# Patient Record
Sex: Female | Born: 1945
Health system: Southern US, Community
[De-identification: ages and names within clinical notes are randomized; demographics above are authoritative.]

## PROBLEM LIST (undated history)

## (undated) DIAGNOSIS — E2839 Other primary ovarian failure: Secondary | ICD-10-CM

## (undated) DIAGNOSIS — M25512 Pain in left shoulder: Secondary | ICD-10-CM

## (undated) DIAGNOSIS — N200 Calculus of kidney: Secondary | ICD-10-CM

## (undated) DIAGNOSIS — E785 Hyperlipidemia, unspecified: Secondary | ICD-10-CM

## (undated) DIAGNOSIS — M1990 Unspecified osteoarthritis, unspecified site: Secondary | ICD-10-CM

## (undated) DIAGNOSIS — E669 Obesity, unspecified: Secondary | ICD-10-CM

## (undated) DIAGNOSIS — I639 Cerebral infarction, unspecified: Secondary | ICD-10-CM

## (undated) DIAGNOSIS — E119 Type 2 diabetes mellitus without complications: Secondary | ICD-10-CM

## (undated) DIAGNOSIS — I10 Essential (primary) hypertension: Secondary | ICD-10-CM

## (undated) DIAGNOSIS — E538 Deficiency of other specified B group vitamins: Secondary | ICD-10-CM

## (undated) HISTORY — DX: Essential (primary) hypertension: I10

## (undated) HISTORY — DX: Calculus of kidney: N20.0

## (undated) HISTORY — DX: Type 2 diabetes mellitus without complications: E11.9

## (undated) HISTORY — PX: ABDOMINAL HYSTERECTOMY: SHX81

## (undated) HISTORY — DX: Unspecified osteoarthritis, unspecified site: M19.90

## (undated) HISTORY — DX: Hyperlipidemia, unspecified: E78.5

## (undated) HISTORY — DX: Obesity, unspecified: E66.9

## (undated) HISTORY — DX: Deficiency of other specified B group vitamins: E53.8

## (undated) HISTORY — DX: Other primary ovarian failure: E28.39

## (undated) HISTORY — PX: CHOLECYSTECTOMY: SHX55

## (undated) HISTORY — PX: OTHER SURGICAL HISTORY: SHX169

## (undated) HISTORY — DX: Pain in left shoulder: M25.512

---

## 2006-11-20 ENCOUNTER — Emergency Department: Payer: Self-pay | Admitting: Unknown Physician Specialty

## 2006-11-20 ENCOUNTER — Other Ambulatory Visit: Payer: Self-pay

## 2007-01-27 ENCOUNTER — Ambulatory Visit: Payer: Self-pay | Admitting: Specialist

## 2007-12-07 HISTORY — PX: LITHOTRIPSY: SUR834

## 2009-07-08 LAB — HM DEXA SCAN

## 2009-11-05 HISTORY — PX: MYRINGOTOMY: SUR874

## 2009-11-14 ENCOUNTER — Ambulatory Visit: Payer: Self-pay

## 2009-11-30 ENCOUNTER — Ambulatory Visit: Payer: Self-pay | Admitting: Urology

## 2009-12-01 ENCOUNTER — Emergency Department: Payer: Self-pay | Admitting: Emergency Medicine

## 2009-12-14 ENCOUNTER — Ambulatory Visit: Payer: Self-pay | Admitting: General Practice

## 2009-12-27 ENCOUNTER — Ambulatory Visit: Payer: Self-pay | Admitting: General Practice

## 2010-03-26 ENCOUNTER — Ambulatory Visit: Payer: Self-pay | Admitting: General Practice

## 2010-05-11 ENCOUNTER — Ambulatory Visit: Payer: Self-pay | Admitting: Otolaryngology

## 2010-06-25 ENCOUNTER — Ambulatory Visit: Payer: Self-pay | Admitting: Urology

## 2010-07-03 ENCOUNTER — Ambulatory Visit: Payer: Self-pay | Admitting: Urology

## 2010-07-05 ENCOUNTER — Ambulatory Visit: Payer: Self-pay | Admitting: Urology

## 2010-07-19 ENCOUNTER — Ambulatory Visit: Payer: Self-pay | Admitting: Urology

## 2010-07-31 LAB — HM PAP SMEAR: HM PAP: NORMAL

## 2010-12-04 ENCOUNTER — Ambulatory Visit: Payer: Self-pay | Admitting: Urology

## 2011-09-09 ENCOUNTER — Ambulatory Visit: Payer: Self-pay | Admitting: Family Medicine

## 2012-07-20 ENCOUNTER — Ambulatory Visit: Payer: Self-pay | Admitting: Gastroenterology

## 2012-07-20 LAB — HM COLONOSCOPY: HM Colonoscopy: NEGATIVE

## 2013-05-31 ENCOUNTER — Ambulatory Visit: Payer: Self-pay | Admitting: Urology

## 2013-07-08 HISTORY — PX: COLONOSCOPY: SHX174

## 2013-07-19 ENCOUNTER — Ambulatory Visit: Payer: Self-pay | Admitting: Urology

## 2013-07-26 ENCOUNTER — Ambulatory Visit: Payer: Self-pay | Admitting: Urology

## 2013-07-28 LAB — URINE CULTURE

## 2013-08-04 ENCOUNTER — Emergency Department: Payer: Self-pay | Admitting: Emergency Medicine

## 2013-08-04 ENCOUNTER — Ambulatory Visit: Payer: Self-pay | Admitting: Urology

## 2013-08-04 LAB — URINALYSIS, COMPLETE
BILIRUBIN, UR: NEGATIVE
KETONE: NEGATIVE
Nitrite: POSITIVE
PH: 6 (ref 4.5–8.0)
Protein: 100
RBC,UR: 3037 /HPF (ref 0–5)
Specific Gravity: 1.012 (ref 1.003–1.030)
Squamous Epithelial: NONE SEEN
WBC UR: 76 /HPF (ref 0–5)

## 2013-09-09 ENCOUNTER — Ambulatory Visit: Payer: Self-pay | Admitting: Urology

## 2013-09-16 ENCOUNTER — Ambulatory Visit: Payer: Self-pay | Admitting: Urology

## 2013-09-23 ENCOUNTER — Ambulatory Visit: Payer: Self-pay | Admitting: Urology

## 2013-10-06 ENCOUNTER — Ambulatory Visit: Payer: Self-pay | Admitting: Urology

## 2014-04-05 ENCOUNTER — Ambulatory Visit: Payer: Self-pay | Admitting: Family Medicine

## 2014-04-05 LAB — HM MAMMOGRAPHY: HM Mammogram: NORMAL

## 2014-06-28 LAB — LIPID PANEL
CHOLESTEROL: 173 mg/dL (ref 0–200)
HDL: 72 mg/dL — AB (ref 35–70)
LDL CALC: 83 mg/dL
Triglycerides: 90 mg/dL (ref 40–160)

## 2014-08-08 DIAGNOSIS — N182 Chronic kidney disease, stage 2 (mild): Secondary | ICD-10-CM | POA: Diagnosis not present

## 2014-08-08 DIAGNOSIS — E1165 Type 2 diabetes mellitus with hyperglycemia: Secondary | ICD-10-CM | POA: Diagnosis not present

## 2014-08-08 DIAGNOSIS — E1122 Type 2 diabetes mellitus with diabetic chronic kidney disease: Secondary | ICD-10-CM | POA: Diagnosis not present

## 2014-10-29 NOTE — Op Note (Signed)
PATIENT NAME:  Christine Roberts, Christine Roberts MR#:  [id number] DATE OF BIRTH:  1946-06-20  DATE OF PROCEDURE:  08/04/2013  PREOPERATIVE DIAGNOSIS: Left renal calculi.   POSTOPERATIVE DIAGNOSIS: Left renal calculi.    PROCEDURES:  1.  Left ureteroscopy. 2.  Placement of left ureteral stent.   SURGEON: John Giovanni, MD   ASSISTANT: None.   ANESTHESIA: General.   INDICATIONS: The patient is a 69 year old female with 3 left renal calculi measuring 6 to 10 mm in diameter. She has been intermittently symptomatic. After discussion discussing treatment options, she has elected ureteroscopy.   DESCRIPTION OF PROCEDURE: The patient was taken to the endoscopy suite and placed on the table in the supine position. A general anesthetic was administered via an endotracheal tube. She was then placed in the low lithotomy position, and her external genitalia were prepped and draped in the usual fashion. A timeout was performed per hospital protocol with all in agreement. A 21-French cystoscope sheath with obturator was lubricated and passed per urethra. Panendoscopy was performed, and the ureteral orifices were normal-appearing with clear efflux bilaterally. Bladder mucosa was normal in appearance without erythema, solid or papillary lesions. A 0.035 Glidewire was placed through the cystoscope and into the left ureteral orifice and easily passed up into the renal pelvis under fluoroscopic guidance. A second 0.035 PTFE guidewire was placed in a similar fashion without difficulty. The cystoscope was removed, and an AUR-8 flexible ureteroscope was placed over the Glidewire. The left distal ureter could not be engaged, and the ureteroscope was removed. A Navigator access sheath was able to be gently placed over the wire into the distal ureter. The obturator was removed. The ureteroscope was repassed up the proximal ureter; however, there was slight narrowing of the proximal ureter, and the ureteroscope could not be advanced. The  guidewire was replaced, and the ureteroscope could not be advanced over the wire. A cystoscope sheath was placed in the bladder, and the ureteroscope was placed through that to provide slightly more resistance, and again the ureteroscope would not advance into the renal pelvis. A dilating balloon was not available. It was elected at this point to place a ureteral stent to allow dilation of the system and return at a later date. A 7-French/24 cm Contour ureteral stent was placed over the Glidewire. The second guidewire was removed. The proximal end of the stent was well positioned in the renal pelvis. The distal end of the stent was well positioned in the bladder under direct vision. The bladder was emptied, and all instruments were removed. A B and O suppository was placed per rectum. She was taken to the PACU in stable condition. There were no complications. EBL was minimal.   ____________________________ Ronda Fairly. Bernardo Heater, MD scs:jcm D: 08/04/2013 14:18:54 ET T: 08/04/2013 16:14:14 ET JOB#: [id number]  cc: Nicki Reaper C. Bernardo Heater, MD, <Dictator> Abbie Sons MD ELECTRONICALLY SIGNED 08/11/2013 11:20

## 2014-11-16 DIAGNOSIS — E1122 Type 2 diabetes mellitus with diabetic chronic kidney disease: Secondary | ICD-10-CM | POA: Diagnosis not present

## 2014-11-16 DIAGNOSIS — E1165 Type 2 diabetes mellitus with hyperglycemia: Secondary | ICD-10-CM | POA: Diagnosis not present

## 2014-11-16 DIAGNOSIS — N200 Calculus of kidney: Secondary | ICD-10-CM | POA: Diagnosis not present

## 2014-11-16 DIAGNOSIS — N182 Chronic kidney disease, stage 2 (mild): Secondary | ICD-10-CM | POA: Diagnosis not present

## 2014-11-16 DIAGNOSIS — E785 Hyperlipidemia, unspecified: Secondary | ICD-10-CM | POA: Diagnosis not present

## 2014-11-16 DIAGNOSIS — Z713 Dietary counseling and surveillance: Secondary | ICD-10-CM | POA: Diagnosis not present

## 2014-11-16 DIAGNOSIS — E669 Obesity, unspecified: Secondary | ICD-10-CM | POA: Diagnosis not present

## 2014-11-16 DIAGNOSIS — I10 Essential (primary) hypertension: Secondary | ICD-10-CM | POA: Diagnosis not present

## 2014-11-16 LAB — HEMOGLOBIN A1C: HEMOGLOBIN A1C: 8.4 % — AB (ref 4.0–6.0)

## 2015-01-13 DIAGNOSIS — R350 Frequency of micturition: Secondary | ICD-10-CM | POA: Diagnosis not present

## 2015-01-13 DIAGNOSIS — R140 Abdominal distension (gaseous): Secondary | ICD-10-CM | POA: Diagnosis not present

## 2015-01-13 DIAGNOSIS — R918 Other nonspecific abnormal finding of lung field: Secondary | ICD-10-CM | POA: Diagnosis not present

## 2015-01-13 DIAGNOSIS — N200 Calculus of kidney: Secondary | ICD-10-CM | POA: Diagnosis not present

## 2015-01-13 DIAGNOSIS — R312 Other microscopic hematuria: Secondary | ICD-10-CM | POA: Diagnosis not present

## 2015-01-13 DIAGNOSIS — M47819 Spondylosis without myelopathy or radiculopathy, site unspecified: Secondary | ICD-10-CM | POA: Diagnosis not present

## 2015-01-25 DIAGNOSIS — H2513 Age-related nuclear cataract, bilateral: Secondary | ICD-10-CM | POA: Diagnosis not present

## 2015-01-25 LAB — HM DIABETES EYE EXAM

## 2015-02-27 ENCOUNTER — Other Ambulatory Visit: Payer: Self-pay | Admitting: Family Medicine

## 2015-02-27 MED ORDER — PIOGLITAZONE HCL 15 MG PO TABS: 15.0000 mg | ORAL_TABLET | Freq: Every day | ORAL | Status: DC

## 2015-02-27 NOTE — Telephone Encounter (Signed)
Requesting refill on Actos, please send to [facility] if you are sending a 90day supply but if you choose to give her a 30day supply (just to see if you want her to stay on it) then send to [facility]

## 2015-02-27 NOTE — Telephone Encounter (Signed)
Patient requesting refill. 

## 2015-03-18 ENCOUNTER — Encounter: Payer: Self-pay | Admitting: Family Medicine

## 2015-03-18 DIAGNOSIS — E785 Hyperlipidemia, unspecified: Secondary | ICD-10-CM | POA: Insufficient documentation

## 2015-03-18 DIAGNOSIS — Z9889 Other specified postprocedural states: Secondary | ICD-10-CM | POA: Insufficient documentation

## 2015-03-18 DIAGNOSIS — E538 Deficiency of other specified B group vitamins: Secondary | ICD-10-CM | POA: Insufficient documentation

## 2015-03-18 DIAGNOSIS — R809 Proteinuria, unspecified: Secondary | ICD-10-CM | POA: Insufficient documentation

## 2015-03-18 DIAGNOSIS — M25519 Pain in unspecified shoulder: Secondary | ICD-10-CM | POA: Insufficient documentation

## 2015-03-18 DIAGNOSIS — K449 Diaphragmatic hernia without obstruction or gangrene: Secondary | ICD-10-CM | POA: Insufficient documentation

## 2015-03-18 DIAGNOSIS — E1121 Type 2 diabetes mellitus with diabetic nephropathy: Secondary | ICD-10-CM | POA: Insufficient documentation

## 2015-03-18 DIAGNOSIS — IMO0002 Reserved for concepts with insufficient information to code with codable children: Secondary | ICD-10-CM | POA: Insufficient documentation

## 2015-03-18 DIAGNOSIS — Z86010 Personal history of colonic polyps: Secondary | ICD-10-CM | POA: Insufficient documentation

## 2015-03-18 DIAGNOSIS — I10 Essential (primary) hypertension: Secondary | ICD-10-CM | POA: Insufficient documentation

## 2015-03-18 DIAGNOSIS — M1990 Unspecified osteoarthritis, unspecified site: Secondary | ICD-10-CM | POA: Insufficient documentation

## 2015-03-18 DIAGNOSIS — N200 Calculus of kidney: Secondary | ICD-10-CM | POA: Insufficient documentation

## 2015-03-18 DIAGNOSIS — H9190 Unspecified hearing loss, unspecified ear: Secondary | ICD-10-CM | POA: Insufficient documentation

## 2015-03-21 ENCOUNTER — Ambulatory Visit (INDEPENDENT_AMBULATORY_CARE_PROVIDER_SITE_OTHER): Payer: Commercial Managed Care - HMO | Admitting: Family Medicine

## 2015-03-21 ENCOUNTER — Encounter: Payer: Self-pay | Admitting: Family Medicine

## 2015-03-21 VITALS — BP 134/76 | HR 90 | Temp 97.6°F | Resp 18 | Ht 62.0 in | Wt 200.2 lb

## 2015-03-21 DIAGNOSIS — R5383 Other fatigue: Secondary | ICD-10-CM

## 2015-03-21 DIAGNOSIS — R1013 Epigastric pain: Secondary | ICD-10-CM | POA: Diagnosis not present

## 2015-03-21 DIAGNOSIS — M150 Primary generalized (osteo)arthritis: Secondary | ICD-10-CM

## 2015-03-21 DIAGNOSIS — R809 Proteinuria, unspecified: Secondary | ICD-10-CM

## 2015-03-21 DIAGNOSIS — E785 Hyperlipidemia, unspecified: Secondary | ICD-10-CM | POA: Diagnosis not present

## 2015-03-21 DIAGNOSIS — M8949 Other hypertrophic osteoarthropathy, multiple sites: Secondary | ICD-10-CM

## 2015-03-21 DIAGNOSIS — R072 Precordial pain: Secondary | ICD-10-CM

## 2015-03-21 DIAGNOSIS — K449 Diaphragmatic hernia without obstruction or gangrene: Secondary | ICD-10-CM

## 2015-03-21 DIAGNOSIS — M159 Polyosteoarthritis, unspecified: Secondary | ICD-10-CM

## 2015-03-21 DIAGNOSIS — M791 Myalgia, unspecified site: Secondary | ICD-10-CM

## 2015-03-21 DIAGNOSIS — Z23 Encounter for immunization: Secondary | ICD-10-CM

## 2015-03-21 DIAGNOSIS — I10 Essential (primary) hypertension: Secondary | ICD-10-CM | POA: Diagnosis not present

## 2015-03-21 DIAGNOSIS — K30 Functional dyspepsia: Secondary | ICD-10-CM

## 2015-03-21 DIAGNOSIS — E1121 Type 2 diabetes mellitus with diabetic nephropathy: Secondary | ICD-10-CM | POA: Diagnosis not present

## 2015-03-21 LAB — POCT UA - MICROALBUMIN: MICROALBUMIN (UR) POC: 20 mg/L

## 2015-03-21 LAB — POCT GLYCOSYLATED HEMOGLOBIN (HGB A1C): HEMOGLOBIN A1C: 7.6

## 2015-03-21 MED ORDER — LISINOPRIL 40 MG PO TABS: 40.0000 mg | ORAL_TABLET | Freq: Every day | ORAL | Status: DC

## 2015-03-21 MED ORDER — OMEPRAZOLE 40 MG PO CPDR: 40.0000 mg | DELAYED_RELEASE_CAPSULE | Freq: Every day | ORAL | Status: DC

## 2015-03-21 MED ORDER — METFORMIN HCL 1000 MG PO TABS: 1000.0000 mg | ORAL_TABLET | Freq: Two times a day (BID) | ORAL | Status: DC

## 2015-03-21 MED ORDER — PIOGLITAZONE HCL 15 MG PO TABS: 15.0000 mg | ORAL_TABLET | Freq: Every day | ORAL | Status: DC

## 2015-03-21 MED ORDER — ASPIRIN EC 81 MG PO TBEC
81.0000 mg | DELAYED_RELEASE_TABLET | Freq: Every day | ORAL | Status: AC
Start: 2015-03-21 — End: ?

## 2015-03-21 NOTE — Progress Notes (Signed)
Name: Christine Roberts   MRN: [id number]    DOB: Feb 23, 1946   Date:03/21/2015       Progress Note  Subjective  Chief Complaint  Chief Complaint  Patient presents with  . Medication Refill    followup  . Diabetes    low-50, avg-142 check BG periodically and has been having dizziness/lightheaded in am with low sugars  . Hypertension    dizziness  . Hyperlipidemia    bone sorness/stiffness    HPI  DMII : her fsbs has been going down, to the 50's, but states the average is 142, but was going down to 50's a lot, however ran out of glipizide a couple of weeks ago and hypoglycemia resolved. HgbA1C has improved since last checked down from 8.4% and 7.6%. Down to 10 units of Levemir daily and taking oral medications as scheduled.   HTN: taking medication and denies side effects, gets dizzy with hypoglycemia only  Hyperlipidemia: she takes Atorvastatin twice weekly and is having muscle pain, usually right leg, feels tired and sore.   Indigestion: she has a history of hiatal hernia, and also states anxiety in the past, but over the past few months she has noticed chest tightness that happens around 2 am a couple times a month, she gets up and takes an aspirin and symptoms resolves. She denies heartburn or regurgitation. She states once she had eaten something spicy before going to bed. No SOB or decrease in exercise tolerance  OA: she has some DIP deformities on both hands , also feels stiff and more tired than usual . Denies headaches. She has a puppy - past 2 months and has been walking the dog daily , but thinks pain started to get worse before she got the dog. Also has noticed fatigue and myalgia.    Patient Active Problem List   Diagnosis Date Noted  . Benign essential HTN 03/18/2015  . Pain in shoulder 03/18/2015  . Dyslipidemia 03/18/2015  . History of surgery to major organs, presenting hazards to health 03/18/2015  . Hearing loss 03/18/2015  . Hiatal hernia 03/18/2015  . History of  colon polyps 03/18/2015  . Calculus of kidney 03/18/2015  . B12 deficiency 03/18/2015  . Microalbuminuria 03/18/2015  . Benign melanoma 03/18/2015  . Adult BMI 30+ 03/18/2015  . Arthritis, degenerative 03/18/2015  . Diabetes mellitus with nephropathy 03/18/2015    Past Surgical History  Procedure Laterality Date  . Lithotripsy  12/2007  . Myringotomy Right 11/2009  . Cystoscopic      Family History  Problem Relation Age of Onset  . Cancer Mother     Social History   Social History  . Marital Status: Single    Spouse Name: N/A  . Number of Children: N/A  . Years of Education: N/A   Occupational History  . Not on file.   Social History Main Topics  . Smoking status: Never Smoker   . Smokeless tobacco: Never Used  . Alcohol Use: Not on file  . Drug Use: No  . Sexual Activity: Not Currently   Other Topics Concern  . Not on file   Social History Narrative     Current outpatient prescriptions:  .  aspirin EC 81 MG tablet, Take 1 tablet (81 mg total) by mouth daily., Disp: 90 tablet, Rfl: 0 .  atorvastatin (LIPITOR) 40 MG tablet, Take by mouth., Disp: , Rfl:  .  calcium citrate (CALCITRATE - DOSED IN MG ELEMENTAL CALCIUM) 950 MG tablet, Take by mouth., Disp: ,  Rfl:  .  Cholecalciferol (VITAMIN D) 2000 UNITS CAPS, Take by mouth., Disp: , Rfl:  .  Cyanocobalamin (B-12) 1000 MCG SUBL, Place under the tongue., Disp: , Rfl:  .  glucose blood (FREESTYLE LITE) test strip, FREESTYLE LITE TEST (In Vitro Strip)  fsbs bid for 0 days  Quantity: 100.00;  Refills: 5   Ordered :06-Aug-2010  Steele Sizer MD;  Started 25-Oct-2009 Active Comments: DX: 250.02, Disp: , Rfl:  .  insulin detemir (LEVEMIR) 100 UNIT/ML injection, Inject into the skin., Disp: , Rfl:  .  lisinopril (PRINIVIL,ZESTRIL) 40 MG tablet, Take 1 tablet (40 mg total) by mouth daily., Disp: 90 tablet, Rfl: 3 .  magnesium oxide (MAG-OX) 400 MG tablet, Take by mouth., Disp: , Rfl:  .  metFORMIN (GLUCOPHAGE) 1000 MG  tablet, Take 1 tablet (1,000 mg total) by mouth 2 (two) times daily., Disp: 180 tablet, Rfl: 1 .  omeprazole (PRILOSEC) 40 MG capsule, Take 1 capsule (40 mg total) by mouth daily., Disp: 30 capsule, Rfl: 2 .  pioglitazone (ACTOS) 15 MG tablet, Take 1 tablet (15 mg total) by mouth daily., Disp: 90 tablet, Rfl: 3  Allergies  Allergen Reactions  . Latex Rash  . Erythromycin      ROS  Ten systems reviewed and is negative except as mentioned in HPI   Objective  Filed Vitals:   03/21/15 1640  BP: 134/76  Pulse: 90  Temp: 97.6 F (36.4 C)  TempSrc: Oral  Resp: 18  Height: 5\' 2"  (1.575 m)  Weight: 200 lb 3.2 oz (90.81 kg)  SpO2: 97%    Body mass index is 36.61 kg/(m^2).  Physical Exam  Constitutional: Patient appears well-developed and well-nourished. Obese  No distress.  HEENT: head atraumatic, normocephalic, pupils equal and reactive to light,  neck supple, throat within normal limits Cardiovascular: Normal rate, regular rhythm and normal heart sounds.  No murmur heard. No BLE edema. Pulmonary/Chest: Effort normal and breath sounds normal. No respiratory distress. Abdominal: Soft.  There is no tenderness. Psychiatric: Patient has a normal mood and affect. behavior is normal. Judgment and thought content normal. Muscular Skeletal: DIP deformity.   Recent Results (from the past 2160 hour(s))  HM DIABETES EYE EXAM     Status: None   Collection Time: 01/25/15 12:00 AM  Result Value Ref Range   HM Diabetic Eye Exam No Retinopathy No Retinopathy  POCT UA - Microalbumin     Status: None   Collection Time: 03/21/15  4:44 PM  Result Value Ref Range   Microalbumin Ur, POC 20 mg/L   Creatinine, POC  mg/dL   Albumin/Creatinine Ratio, Urine, POC    POCT HgB A1C     Status: None   Collection Time: 03/21/15  4:44 PM  Result Value Ref Range   Hemoglobin A1C 7.6     Diabetic Foot Exam - Simple   Simple Foot Form  Visual Inspection  No deformities, no ulcerations, no other skin  breakdown bilaterally:  Yes  Sensation Testing  Pulse Check  Comments       PHQ2/9: Depression screen PHQ 2/9 03/21/2015  Decreased Interest 0  Down, Depressed, Hopeless 0  PHQ - 2 Score 0     Fall Risk: Fall Risk  03/21/2015  Falls in the past year? No      Functional Status Survey: Is the patient deaf or have difficulty hearing?: No Does the patient have difficulty seeing, even when wearing glasses/contacts?: Yes (glassses) Does the patient have difficulty concentrating, remembering, or making  decisions?: No Does the patient have difficulty walking or climbing stairs?: No Does the patient have difficulty dressing or bathing?: No Does the patient have difficulty doing errands alone such as visiting a doctor's office or shopping?: No    Assessment & Plan  1. Diabetic nephropathy associated with type 2 diabetes mellitus Stay off glipizide, continue 10 units of levemir per day, continue increase in physical activity to control glucose and drink plenty of water, follow a diabetic diet. Explained that low sugar is worse than slightly high sugar for her age group - POCT UA - Microalbumin - POCT HgB A1C - pioglitazone (ACTOS) 15 MG tablet; Take 1 tablet (15 mg total) by mouth daily.  Dispense: 90 tablet; Refill: 3 - metFORMIN (GLUCOPHAGE) 1000 MG tablet; Take 1 tablet (1,000 mg total) by mouth 2 (two) times daily.  Dispense: 180 tablet; Refill: 1  2. Needs flu shot  - Flu vaccine HIGH DOSE PF (Fluzone High dose)  3. Need for pneumococcal vaccination  - Pneumococcal conjugate vaccine 13-valent  4. Primary osteoarthritis involving multiple joints  take Tylenol prn   5. Hiatal hernia Diagnosed in the past, usually no problems.   6. Dyslipidemia Taking Atorvastatin twice weekly - Lipid panel  7. Benign essential HTN At goal  - lisinopril (PRINIVIL,ZESTRIL) 40 MG tablet; Take 1 tablet (40 mg total) by mouth daily.  Dispense: 90 tablet; Refill: 3 - Comprehensive  metabolic panel - CBC with Differential/Platelet  8. Microalbuminuria Continue ace  9. Other fatigue Sleeping more than usual , body aches, we will check sed rate and c-reactive protein, make sure not fibromyalgia rheumatica - Vitamin B12 - Vit D  25 hydroxy (rtn osteoporosis monitoring) - TSH  10. Indigestion Discussed differential diagnosis: angina, GERD or panic attack ( she has a history of anxiety in the past ) but she wants to hold off on cardiologist referral or alprazolam to take prn. Advised to call 911 if symptoms gets worse, lasts more than 15 minutes, associated diaphoresis, SOB or dizziness - omeprazole (PRILOSEC) 40 MG capsule; Take 1 capsule (40 mg total) by mouth daily.  Dispense: 30 capsule; Refill: 2  11. Precordial pain  - EKG 12-Lead  12. Myalgia With joint aches, fatigue, going on for the past few months, discussed polymyalgia rheumatica, we will check labs - C-reactive protein - Sedimentation rate

## 2015-03-29 ENCOUNTER — Telehealth: Payer: Self-pay | Admitting: Family Medicine

## 2015-03-29 ENCOUNTER — Other Ambulatory Visit: Payer: Self-pay

## 2015-03-29 MED ORDER — INSULIN DETEMIR 100 UNIT/ML ~~LOC~~ SOLN: 10.0000 [IU] | Freq: Every day | SUBCUTANEOUS | Status: DC

## 2015-03-29 NOTE — Telephone Encounter (Signed)
Requesting refill on Levimer. She only has one dose left. Requesting that you send a few doses to [facility] and then a 90day supply to [facility].

## 2015-03-29 NOTE — Telephone Encounter (Signed)
Wants a month supple sent to local [facility] and then 90 day sent to [facility]

## 2015-04-04 DIAGNOSIS — R5383 Other fatigue: Secondary | ICD-10-CM | POA: Diagnosis not present

## 2015-04-04 DIAGNOSIS — I10 Essential (primary) hypertension: Secondary | ICD-10-CM | POA: Diagnosis not present

## 2015-04-04 DIAGNOSIS — E785 Hyperlipidemia, unspecified: Secondary | ICD-10-CM | POA: Diagnosis not present

## 2015-04-05 LAB — VITAMIN B12: Vitamin B-12: 718 pg/mL (ref 211–946)

## 2015-04-05 LAB — CBC WITH DIFFERENTIAL/PLATELET
BASOS: 0 %
Basophils Absolute: 0 10*3/uL (ref 0.0–0.2)
EOS (ABSOLUTE): 0.4 10*3/uL (ref 0.0–0.4)
Eos: 5 %
Hematocrit: 36 % (ref 34.0–46.6)
Hemoglobin: 11.6 g/dL (ref 11.1–15.9)
Immature Grans (Abs): 0 10*3/uL (ref 0.0–0.1)
Immature Granulocytes: 0 %
Lymphocytes Absolute: 2.1 10*3/uL (ref 0.7–3.1)
Lymphs: 28 %
MCH: 27.3 pg (ref 26.6–33.0)
MCHC: 32.2 g/dL (ref 31.5–35.7)
MCV: 85 fL (ref 79–97)
MONOS ABS: 0.5 10*3/uL (ref 0.1–0.9)
Monocytes: 6 %
NEUTROS ABS: 4.6 10*3/uL (ref 1.4–7.0)
Neutrophils: 61 %
PLATELETS: 334 10*3/uL (ref 150–379)
RBC: 4.25 x10E6/uL (ref 3.77–5.28)
RDW: 14.1 % (ref 12.3–15.4)
WBC: 7.6 10*3/uL (ref 3.4–10.8)

## 2015-04-05 LAB — COMPREHENSIVE METABOLIC PANEL
A/G RATIO: 1.6 (ref 1.1–2.5)
ALT: 13 IU/L (ref 0–32)
AST: 8 IU/L (ref 0–40)
Albumin: 4.1 g/dL (ref 3.5–4.8)
Alkaline Phosphatase: 82 IU/L (ref 39–117)
BILIRUBIN TOTAL: 0.4 mg/dL (ref 0.0–1.2)
BUN/Creatinine Ratio: 18 (ref 11–26)
BUN: 15 mg/dL (ref 8–27)
CALCIUM: 9.6 mg/dL (ref 8.7–10.3)
CO2: 21 mmol/L (ref 18–29)
Chloride: 95 mmol/L — ABNORMAL LOW (ref 97–108)
Creatinine, Ser: 0.83 mg/dL (ref 0.57–1.00)
GFR, EST AFRICAN AMERICAN: 83 mL/min/{1.73_m2} (ref >59)
GFR, EST NON AFRICAN AMERICAN: 72 mL/min/{1.73_m2} (ref >59)
GLOBULIN, TOTAL: 2.6 g/dL (ref 1.5–4.5)
Glucose: 349 mg/dL — ABNORMAL HIGH (ref 65–99)
POTASSIUM: 4.7 mmol/L (ref 3.5–5.2)
SODIUM: 135 mmol/L (ref 134–144)
TOTAL PROTEIN: 6.7 g/dL (ref 6.0–8.5)

## 2015-04-05 LAB — LIPID PANEL
CHOLESTEROL TOTAL: 190 mg/dL (ref 100–199)
Chol/HDL Ratio: 2.9 ratio units (ref 0.0–4.4)
HDL: 65 mg/dL (ref >39)
LDL CALC: 97 mg/dL (ref 0–99)
TRIGLYCERIDES: 142 mg/dL (ref 0–149)
VLDL Cholesterol Cal: 28 mg/dL (ref 5–40)

## 2015-04-05 LAB — VITAMIN D 25 HYDROXY (VIT D DEFICIENCY, FRACTURES): Vit D, 25-Hydroxy: 34.4 ng/mL (ref 30.0–100.0)

## 2015-04-05 LAB — C-REACTIVE PROTEIN: CRP: 4.6 mg/L (ref 0.0–4.9)

## 2015-04-05 LAB — TSH: TSH: 1.29 u[IU]/mL (ref 0.450–4.500)

## 2015-04-14 ENCOUNTER — Other Ambulatory Visit: Payer: Self-pay

## 2015-04-14 NOTE — Telephone Encounter (Signed)
Free style life test strips code #16

## 2015-04-17 ENCOUNTER — Other Ambulatory Visit: Payer: Self-pay | Admitting: Family Medicine

## 2015-04-17 DIAGNOSIS — E1121 Type 2 diabetes mellitus with diabetic nephropathy: Secondary | ICD-10-CM

## 2015-04-17 MED ORDER — GLUCOSE BLOOD VI STRP: ORAL_STRIP | Status: DC

## 2015-04-18 ENCOUNTER — Other Ambulatory Visit: Payer: Self-pay | Admitting: Family Medicine

## 2015-04-18 DIAGNOSIS — E1121 Type 2 diabetes mellitus with diabetic nephropathy: Secondary | ICD-10-CM

## 2015-04-18 MED ORDER — FREESTYLE LANCETS MISC: Status: DC

## 2015-04-18 MED ORDER — GLUCOSE BLOOD VI STRP: ORAL_STRIP | Status: DC

## 2015-05-09 ENCOUNTER — Encounter: Payer: Self-pay | Admitting: Family Medicine

## 2015-05-09 ENCOUNTER — Ambulatory Visit (INDEPENDENT_AMBULATORY_CARE_PROVIDER_SITE_OTHER): Payer: Commercial Managed Care - HMO | Admitting: Family Medicine

## 2015-05-09 VITALS — BP 130/78 | HR 78 | Temp 97.6°F | Resp 16 | Ht 62.0 in | Wt 195.2 lb

## 2015-05-09 DIAGNOSIS — N760 Acute vaginitis: Secondary | ICD-10-CM

## 2015-05-09 DIAGNOSIS — M5432 Sciatica, left side: Secondary | ICD-10-CM | POA: Diagnosis not present

## 2015-05-09 DIAGNOSIS — D692 Other nonthrombocytopenic purpura: Secondary | ICD-10-CM

## 2015-05-09 DIAGNOSIS — E1121 Type 2 diabetes mellitus with diabetic nephropathy: Secondary | ICD-10-CM

## 2015-05-09 MED ORDER — FLUCONAZOLE 150 MG PO TABS: 150.0000 mg | ORAL_TABLET | ORAL | Status: DC

## 2015-05-09 MED ORDER — MELOXICAM 15 MG PO TABS: 15.0000 mg | ORAL_TABLET | Freq: Every day | ORAL | Status: DC

## 2015-05-09 MED ORDER — INSULIN DETEMIR 100 UNIT/ML ~~LOC~~ SOLN: 15.0000 [IU] | Freq: Every day | SUBCUTANEOUS | Status: DC

## 2015-05-09 MED ORDER — HYDROCODONE-ACETAMINOPHEN 10-325 MG PO TABS: 1.0000 | ORAL_TABLET | Freq: Four times a day (QID) | ORAL | Status: DC | PRN

## 2015-05-09 NOTE — Progress Notes (Signed)
Name: Christine Roberts   MRN: [id number]    DOB: 03-29-1946   Date:05/09/2015       Progress Note  Subjective  Chief Complaint  Chief Complaint  Patient presents with  . Follow-up    1 month  . Diabetes    has stopped glipizide checking BG 1x week low-90, high-235  . Vaginitis    using vagisil and azo, having itching,burning onset 3 weeks  . Sciatica    left leg pain onset several months and worsening    HPI  DM II: she has been off Glipizide, and we had decreased Levemir to 10 units daily, but glucose is going up, states this morning was 235 ( but she was in a party last night ), usually around 170's, which is higher than normal for her. She denies polyphagia, but has noticed some polydipsia and polyuria.   Vaginitis; she has noticed that since glucose has been high, and she has noticed vaginal itching, without any discharge, over the past few weeks. She has tried [employer] but it did not improve her symptoms. Symptoms are intermittent. Not sexually active.   Sciatica: pain on left buttocks and radiates down left leg. Described as aching sensation.  No weakness, no numbness or tingling. No rashes.  She wakes up feeling a little stiff. 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    Patient Active Problem List   Diagnosis Date Noted  . Benign essential HTN 03/18/2015  . Pain in shoulder 03/18/2015  . Dyslipidemia 03/18/2015  . History of surgery to major organs, presenting hazards to health 03/18/2015  . Hearing loss 03/18/2015  . Hiatal hernia 03/18/2015  . History of colon polyps 03/18/2015  . Calculus of kidney 03/18/2015  . B12 deficiency 03/18/2015  . Microalbuminuria 03/18/2015  . Benign melanoma 03/18/2015  . Adult BMI 30+ 03/18/2015  . Arthritis, degenerative 03/18/2015  . Diabetes mellitus with nephropathy ([place]) 03/18/2015    Past Surgical History  Procedure Laterality Date  . Lithotripsy  12/2007  . Myringotomy Right  11/2009  . Cystoscopic      Family History  Problem Relation Age of Onset  . Cancer Mother     Social History   Social History  . Marital Status: Single    Spouse Name: N/A  . Number of Children: N/A  . Years of Education: N/A   Occupational History  . Not on file.   Social History Main Topics  . Smoking status: Never Smoker   . Smokeless tobacco: Never Used  . Alcohol Use: Not on file  . Drug Use: No  . Sexual Activity: Not Currently   Other Topics Concern  . Not on file   Social History Narrative     Current outpatient prescriptions:  .  aspirin EC 81 MG tablet, Take 1 tablet (81 mg total) by mouth daily., Disp: 90 tablet, Rfl: 0 .  atorvastatin (LIPITOR) 40 MG tablet, Take by mouth., Disp: , Rfl:  .  calcium citrate (CALCITRATE - DOSED IN MG ELEMENTAL CALCIUM) 950 MG tablet, Take by mouth., Disp: , Rfl:  .  Cholecalciferol (VITAMIN D) 2000 UNITS CAPS, Take by mouth., Disp: , Rfl:  .  Cyanocobalamin (B-12) 1000 MCG SUBL, Place under the tongue., Disp: , Rfl:  .  fluconazole (DIFLUCAN) 150 MG tablet, Take 1 tablet (150 mg total) by mouth every other day., Disp: 3 tablet, Rfl: 0 .  glucose blood (FREESTYLE LITE) test strip,  Use as instructed, Disp: 100 each, Rfl: 12 .  HYDROcodone-acetaminophen (NORCO) 10-325 MG tablet, Take 1 tablet by mouth every 6 (six) hours as needed for moderate pain., Disp: 20 tablet, Rfl: 0 .  insulin detemir (LEVEMIR) 100 UNIT/ML injection, Inject 0.15 mLs (15 Units total) into the skin daily., Disp: 10 mL, Rfl: 0 .  Lancets (FREESTYLE) lancets, Use as instructed, Disp: 100 each, Rfl: 12 .  lisinopril (PRINIVIL,ZESTRIL) 40 MG tablet, Take 1 tablet (40 mg total) by mouth daily., Disp: 90 tablet, Rfl: 3 .  magnesium oxide (MAG-OX) 400 MG tablet, Take by mouth., Disp: , Rfl:  .  meloxicam (MOBIC) 15 MG tablet, Take 1 tablet (15 mg total) by mouth daily., Disp: 30 tablet, Rfl: 0 .  metFORMIN (GLUCOPHAGE) 1000 MG tablet, Take 1 tablet (1,000 mg  total) by mouth 2 (two) times daily., Disp: 180 tablet, Rfl: 1 .  omeprazole (PRILOSEC) 40 MG capsule, Take 1 capsule (40 mg total) by mouth daily., Disp: 30 capsule, Rfl: 2 .  pioglitazone (ACTOS) 15 MG tablet, Take 1 tablet (15 mg total) by mouth daily., Disp: 90 tablet, Rfl: 3  Allergies  Allergen Reactions  . Latex Rash  . Erythromycin      ROS  Constitutional: Negative for fever or weight change.  Respiratory: Negative for cough and shortness of breath.   Cardiovascular: Negative for chest pain or palpitations.  Gastrointestinal: Negative for abdominal pain, no bowel changes.  Musculoskeletal: Positive  for gait problem ( when she first gets up, stands still and after that she moves, stiff )  or joint swelling.  Skin: Negative for rash.  Neurological: Negative for dizziness or headache.  No other specific complaints in a complete review of systems (except as listed in HPI above).  Objective  Filed Vitals:   05/09/15 0954  BP: 130/78  Pulse: 78  Temp: 97.6 F (36.4 C)  TempSrc: Oral  Resp: 16  Height: 5\' 2"  (1.575 m)  Weight: 195 lb 3.2 oz (88.542 kg)  SpO2: 95%    Body mass index is 35.69 kg/(m^2).  Physical Exam  Constitutional: Patient appears well-developed and well-nourished. Obese No distress.  HEENT: head atraumatic, normocephalic, pupils equal and reactive to light,, neck supple, throat within normal limits Cardiovascular: Normal rate, regular rhythm and normal heart sounds.  No murmur heard. No BLE edema. Pulmonary/Chest: Effort normal and breath sounds normal. No respiratory distress. Abdominal: Soft.  There is no tenderness. Skin: ecchymosis on both upper extremities GYN: she wants to hold off on exam, wants to try medication first Psychiatric: Patient has a normal mood and affect. behavior is normal. Judgment and thought content normal. Muscular Skeletal: normal rom of lumbar spine, negative straight leg raise, normal hip exam, pain during palpation of  left lower back over spinal processes and paraspinal muscles. No rashes  Recent Results (from the past 2160 hour(s))  POCT UA - Microalbumin     Status: None   Collection Time: 03/21/15  4:44 PM  Result Value Ref Range   Microalbumin Ur, POC 20 mg/L   Creatinine, POC  mg/dL   Albumin/Creatinine Ratio, Urine, POC    POCT HgB A1C     Status: None   Collection Time: 03/21/15  4:44 PM  Result Value Ref Range   Hemoglobin A1C 7.6   Lipid panel     Status: None   Collection Time: 04/04/15  8:10 AM  Result Value Ref Range   Cholesterol, Total 190 100 - 199 mg/dL   Triglycerides 142  0 - 149 mg/dL   HDL 65 >39 mg/dL    Comment: According to ATP-III Guidelines, HDL-C >59 mg/dL is considered a negative risk factor for CHD.    VLDL Cholesterol Cal 28 5 - 40 mg/dL   LDL Calculated 97 0 - 99 mg/dL   Chol/HDL Ratio 2.9 0.0 - 4.4 ratio units    Comment:                                   T. Chol/HDL Ratio                                             Men  Women                               1/2 Avg.Risk  3.4    3.3                                   Avg.Risk  5.0    4.4                                2X Avg.Risk  9.6    7.1                                3X Avg.Risk 23.4   11.0   Comprehensive metabolic panel     Status: Abnormal   Collection Time: 04/04/15  8:10 AM  Result Value Ref Range   Glucose 349 (H) 65 - 99 mg/dL   BUN 15 8 - 27 mg/dL   Creatinine, Ser 0.83 0.57 - 1.00 mg/dL   GFR calc non Af Amer 72 >59 mL/min/1.73   GFR calc Af Amer 83 >59 mL/min/1.73   BUN/Creatinine Ratio 18 11 - 26   Sodium 135 134 - 144 mmol/L   Potassium 4.7 3.5 - 5.2 mmol/L   Chloride 95 (L) 97 - 108 mmol/L   CO2 21 18 - 29 mmol/L   Calcium 9.6 8.7 - 10.3 mg/dL   Total Protein 6.7 6.0 - 8.5 g/dL   Albumin 4.1 3.5 - 4.8 g/dL   Globulin, Total 2.6 1.5 - 4.5 g/dL   Albumin/Globulin Ratio 1.6 1.1 - 2.5   Bilirubin Total 0.4 0.0 - 1.2 mg/dL   Alkaline Phosphatase 82 39 - 117 IU/L   AST 8 0 - 40 IU/L   ALT  13 0 - 32 IU/L  CBC with Differential/Platelet     Status: None   Collection Time: 04/04/15  8:10 AM  Result Value Ref Range   WBC 7.6 3.4 - 10.8 x10E3/uL   RBC 4.25 3.77 - 5.28 x10E6/uL   Hemoglobin 11.6 11.1 - 15.9 g/dL   Hematocrit 36.0 34.0 - 46.6 %   MCV 85 79 - 97 fL   MCH 27.3 26.6 - 33.0 pg   MCHC 32.2 31.5 - 35.7 g/dL   RDW 14.1 12.3 - 15.4 %   Platelets 334 150 - 379 x10E3/uL   Neutrophils 61 %   Lymphs 28 %   Monocytes 6 %  Eos 5 %   Basos 0 %   Neutrophils Absolute 4.6 1.4 - 7.0 x10E3/uL   Lymphocytes Absolute 2.1 0.7 - 3.1 x10E3/uL   Monocytes Absolute 0.5 0.1 - 0.9 x10E3/uL   EOS (ABSOLUTE) 0.4 0.0 - 0.4 x10E3/uL   Basophils Absolute 0.0 0.0 - 0.2 x10E3/uL   Immature Granulocytes 0 %   Immature Grans (Abs) 0.0 0.0 - 0.1 x10E3/uL  Vitamin B12     Status: None   Collection Time: 04/04/15  8:10 AM  Result Value Ref Range   Vitamin B-12 718 211 - 946 pg/mL  Vit D  25 hydroxy (rtn osteoporosis monitoring)     Status: None   Collection Time: 04/04/15  8:10 AM  Result Value Ref Range   Vit D, 25-Hydroxy 34.4 30.0 - 100.0 ng/mL    Comment: Vitamin D deficiency has been defined by the [place] and an Endocrine Society practice guideline as a level of serum 25-OH vitamin D less than 20 ng/mL (1,2). The Endocrine Society went on to further define vitamin D insufficiency as a level between 21 and 29 ng/mL (2). 1. IOM (Institute of Medicine). 2010. Dietary reference    intakes for calcium and D. [place]: The    [employer]. 2. Holick MF, Binkley [place], Bischoff-Ferrari HA, et al.    Evaluation, treatment, and prevention of vitamin D    deficiency: an Endocrine Society clinical practice    guideline. JCEM. 2011 Jul; 96(7):1911-30.   TSH     Status: None   Collection Time: 04/04/15  8:10 AM  Result Value Ref Range   TSH 1.290 0.450 - 4.500 uIU/mL  C-reactive protein     Status: None   Collection Time: 04/04/15  8:10 AM  Result Value Ref  Range   CRP 4.6 0.0 - 4.9 mg/L    PHQ2/9: Depression screen PHQ 2/9 03/21/2015  Decreased Interest 0  Down, Depressed, Hopeless 0  PHQ - 2 Score 0     Fall Risk: Fall Risk  03/21/2015  Falls in the past year? No     Assessment & Plan  1. Diabetic nephropathy associated with type 2 diabetes mellitus (HCC)  Adjust dose of insulin to 15 daily, drink more water throughout the day, check glucose at least for the next 3 days to get an average - insulin detemir (LEVEMIR) 100 UNIT/ML injection; Inject 0.15 mLs (15 Units total) into the skin daily.  Dispense: 10 mL; Refill: 0  2. Vaginitis   - fluconazole (DIFLUCAN) 150 MG tablet; Take 1 tablet (150 mg total) by mouth every other day.  Dispense: 3 tablet; Refill: 0  3. Back pain with left-sided sciatica  We will hold off on steroids, because of DM - glucose is up - discussed gabapentin, but she also would like to hold off because of possible side effects, try nsaid's and hydrocodone prn, she used to see a chiropractor, Dr. Milagros Reap and she will contact him to get an adjustment, if symptoms do not improve or gets worse she will return and we may need to get an MRI - meloxicam (MOBIC) 15 MG tablet; Take 1 tablet (15 mg total) by mouth daily.  Dispense: 30 tablet; Refill: 0 - HYDROcodone-acetaminophen (NORCO) 10-325 MG tablet; Take 1 tablet by mouth every 6 (six) hours as needed for moderate pain.  Dispense: 20 tablet; Refill: 0  4. Senile purpura ([place])

## 2015-05-24 ENCOUNTER — Observation Stay: Payer: Commercial Managed Care - HMO

## 2015-05-24 ENCOUNTER — Emergency Department: Payer: Commercial Managed Care - HMO

## 2015-05-24 ENCOUNTER — Observation Stay
Admission: EM | Admit: 2015-05-24 | Discharge: 2015-05-25 | Disposition: A | Discharge status: Home / Self Care | Payer: Commercial Managed Care - HMO | Attending: Internal Medicine | Admitting: Internal Medicine

## 2015-05-24 DIAGNOSIS — R519 Headache, unspecified: Secondary | ICD-10-CM

## 2015-05-24 DIAGNOSIS — R4701 Aphasia: Secondary | ICD-10-CM | POA: Diagnosis not present

## 2015-05-24 DIAGNOSIS — G459 Transient cerebral ischemic attack, unspecified: Secondary | ICD-10-CM | POA: Diagnosis not present

## 2015-05-24 DIAGNOSIS — E538 Deficiency of other specified B group vitamins: Secondary | ICD-10-CM | POA: Insufficient documentation

## 2015-05-24 DIAGNOSIS — Z794 Long term (current) use of insulin: Secondary | ICD-10-CM | POA: Diagnosis not present

## 2015-05-24 DIAGNOSIS — Z9889 Other specified postprocedural states: Secondary | ICD-10-CM | POA: Diagnosis not present

## 2015-05-24 DIAGNOSIS — I10 Essential (primary) hypertension: Secondary | ICD-10-CM

## 2015-05-24 DIAGNOSIS — R410 Disorientation, unspecified: Secondary | ICD-10-CM | POA: Diagnosis not present

## 2015-05-24 DIAGNOSIS — G250 Essential tremor: Secondary | ICD-10-CM | POA: Diagnosis not present

## 2015-05-24 DIAGNOSIS — Z7982 Long term (current) use of aspirin: Secondary | ICD-10-CM | POA: Insufficient documentation

## 2015-05-24 DIAGNOSIS — E785 Hyperlipidemia, unspecified: Secondary | ICD-10-CM | POA: Insufficient documentation

## 2015-05-24 DIAGNOSIS — E119 Type 2 diabetes mellitus without complications: Secondary | ICD-10-CM | POA: Diagnosis not present

## 2015-05-24 DIAGNOSIS — I639 Cerebral infarction, unspecified: Secondary | ICD-10-CM | POA: Diagnosis not present

## 2015-05-24 DIAGNOSIS — J90 Pleural effusion, not elsewhere classified: Secondary | ICD-10-CM | POA: Diagnosis not present

## 2015-05-24 DIAGNOSIS — Z881 Allergy status to other antibiotic agents status: Secondary | ICD-10-CM | POA: Insufficient documentation

## 2015-05-24 DIAGNOSIS — M1990 Unspecified osteoarthritis, unspecified site: Secondary | ICD-10-CM | POA: Insufficient documentation

## 2015-05-24 DIAGNOSIS — Z8249 Family history of ischemic heart disease and other diseases of the circulatory system: Secondary | ICD-10-CM | POA: Diagnosis not present

## 2015-05-24 DIAGNOSIS — Z833 Family history of diabetes mellitus: Secondary | ICD-10-CM | POA: Insufficient documentation

## 2015-05-24 DIAGNOSIS — E1121 Type 2 diabetes mellitus with diabetic nephropathy: Secondary | ICD-10-CM

## 2015-05-24 DIAGNOSIS — R4781 Slurred speech: Secondary | ICD-10-CM | POA: Diagnosis not present

## 2015-05-24 DIAGNOSIS — R51 Headache: Secondary | ICD-10-CM | POA: Diagnosis not present

## 2015-05-24 DIAGNOSIS — Z91040 Latex allergy status: Secondary | ICD-10-CM | POA: Diagnosis not present

## 2015-05-24 DIAGNOSIS — E669 Obesity, unspecified: Secondary | ICD-10-CM | POA: Insufficient documentation

## 2015-05-24 DIAGNOSIS — Z87442 Personal history of urinary calculi: Secondary | ICD-10-CM | POA: Insufficient documentation

## 2015-05-24 DIAGNOSIS — Z6835 Body mass index (BMI) 35.0-35.9, adult: Secondary | ICD-10-CM | POA: Insufficient documentation

## 2015-05-24 DIAGNOSIS — G8929 Other chronic pain: Secondary | ICD-10-CM | POA: Diagnosis not present

## 2015-05-24 LAB — COMPREHENSIVE METABOLIC PANEL
ALBUMIN: 3.9 g/dL (ref 3.5–5.0)
ALT: 15 U/L (ref 14–54)
ANION GAP: 9 (ref 5–15)
AST: 16 U/L (ref 15–41)
Alkaline Phosphatase: 77 U/L (ref 38–126)
BILIRUBIN TOTAL: 0.6 mg/dL (ref 0.3–1.2)
BUN: 14 mg/dL (ref 6–20)
CO2: 25 mmol/L (ref 22–32)
Calcium: 9.4 mg/dL (ref 8.9–10.3)
Chloride: 105 mmol/L (ref 101–111)
Creatinine, Ser: 0.56 mg/dL (ref 0.44–1.00)
GFR calc Af Amer: >60 mL/min (ref >60)
GFR calc non Af Amer: >60 mL/min (ref >60)
GLUCOSE: 134 mg/dL — AB (ref 65–99)
POTASSIUM: 3.6 mmol/L (ref 3.5–5.1)
SODIUM: 139 mmol/L (ref 135–145)
TOTAL PROTEIN: 7.4 g/dL (ref 6.5–8.1)

## 2015-05-24 LAB — HEMOGLOBIN A1C: Hgb A1c MFr Bld: 9.8 % — ABNORMAL HIGH (ref 4.0–6.0)

## 2015-05-24 LAB — CBC
HCT: 36.5 % (ref 35.0–47.0)
Hemoglobin: 11.8 g/dL — ABNORMAL LOW (ref 12.0–16.0)
MCH: 27 pg (ref 26.0–34.0)
MCHC: 32.4 g/dL (ref 32.0–36.0)
MCV: 83.3 fL (ref 80.0–100.0)
PLATELETS: 300 10*3/uL (ref 150–440)
RBC: 4.38 MIL/uL (ref 3.80–5.20)
RDW: 14.3 % (ref 11.5–14.5)
WBC: 8.9 10*3/uL (ref 3.6–11.0)

## 2015-05-24 LAB — GLUCOSE, CAPILLARY: GLUCOSE-CAPILLARY: 99 mg/dL (ref 65–99)

## 2015-05-24 LAB — URINALYSIS COMPLETE WITH MICROSCOPIC (ARMC ONLY)
Bacteria, UA: NONE SEEN
Bilirubin Urine: NEGATIVE
GLUCOSE, UA: NEGATIVE mg/dL
Hgb urine dipstick: NEGATIVE
Ketones, ur: NEGATIVE mg/dL
LEUKOCYTES UA: NEGATIVE
Nitrite: NEGATIVE
PROTEIN: NEGATIVE mg/dL
RBC / HPF: NONE SEEN RBC/hpf (ref 0–5)
SPECIFIC GRAVITY, URINE: 1.008 (ref 1.005–1.030)
pH: 5 (ref 5.0–8.0)

## 2015-05-24 LAB — DIFFERENTIAL
BASOS PCT: 1 %
Basophils Absolute: 0.1 10*3/uL (ref 0–0.1)
EOS ABS: 0.3 10*3/uL (ref 0–0.7)
EOS PCT: 3 %
LYMPHS ABS: 2.1 10*3/uL (ref 1.0–3.6)
Lymphocytes Relative: 23 %
Monocytes Absolute: 0.5 10*3/uL (ref 0.2–0.9)
Monocytes Relative: 6 %
NEUTROS PCT: 67 %
Neutro Abs: 6 10*3/uL (ref 1.4–6.5)

## 2015-05-24 LAB — LIPID PANEL
Cholesterol: 183 mg/dL (ref 0–200)
HDL: 66 mg/dL (ref >40)
LDL CALC: 94 mg/dL (ref 0–99)
TRIGLYCERIDES: 116 mg/dL (ref <150)
Total CHOL/HDL Ratio: 2.8 RATIO
VLDL: 23 mg/dL (ref 0–40)

## 2015-05-24 LAB — TROPONIN I

## 2015-05-24 LAB — ETHANOL

## 2015-05-24 LAB — APTT: aPTT: 27 seconds (ref 24–36)

## 2015-05-24 LAB — PROTIME-INR
INR: 0.96
PROTHROMBIN TIME: 13 s (ref 11.4–15.0)

## 2015-05-24 MED ORDER — IOHEXOL 350 MG/ML SOLN
80.0000 mL | Freq: Once | INTRAVENOUS | Status: AC | PRN
  Administered 2015-05-24: 80 mL via INTRAVENOUS

## 2015-05-24 MED ORDER — METFORMIN HCL 500 MG PO TABS: 1000.0000 mg | ORAL_TABLET | Freq: Two times a day (BID) | ORAL | Status: DC

## 2015-05-24 MED ORDER — MELOXICAM 7.5 MG PO TABS
15.0000 mg | ORAL_TABLET | Freq: Every day | ORAL | Status: DC
  Administered 2015-05-24 – 2015-05-25 (×2): 15 mg via ORAL
  Filled 2015-05-24 (×2): qty 2

## 2015-05-24 MED ORDER — ASPIRIN 81 MG PO CHEW
324.0000 mg | CHEWABLE_TABLET | Freq: Once | ORAL | Status: AC
  Administered 2015-05-24: 324 mg via ORAL
  Filled 2015-05-24: qty 4

## 2015-05-24 MED ORDER — OXYCODONE-ACETAMINOPHEN 5-325 MG PO TABS
1.0000 | ORAL_TABLET | Freq: Four times a day (QID) | ORAL | Status: DC | PRN
  Administered 2015-05-24 – 2015-05-25 (×2): 1 via ORAL
  Filled 2015-05-24 (×2): qty 1

## 2015-05-24 MED ORDER — ATORVASTATIN CALCIUM 20 MG PO TABS
40.0000 mg | ORAL_TABLET | ORAL | Status: DC
Start: 2015-05-24 — End: 2015-05-25
  Administered 2015-05-24: 40 mg via ORAL
  Filled 2015-05-24: qty 2

## 2015-05-24 MED ORDER — HYDROCODONE-ACETAMINOPHEN 10-325 MG PO TABS: 1.0000 | ORAL_TABLET | Freq: Four times a day (QID) | ORAL | Status: DC | PRN

## 2015-05-24 MED ORDER — CALCIUM CITRATE-VITAMIN D 315-250 MG-UNIT PO TABS
1.0000 | ORAL_TABLET | Freq: Every day | ORAL | Status: DC
  Administered 2015-05-25: 07:00:00 1 via ORAL
  Filled 2015-05-24 (×2): qty 1

## 2015-05-24 MED ORDER — PANTOPRAZOLE SODIUM 40 MG PO TBEC
40.0000 mg | DELAYED_RELEASE_TABLET | Freq: Every day | ORAL | Status: DC
  Administered 2015-05-24 – 2015-05-25 (×2): 40 mg via ORAL
  Filled 2015-05-24 (×2): qty 1

## 2015-05-24 MED ORDER — INSULIN ASPART 100 UNIT/ML ~~LOC~~ SOLN
0.0000 [IU] | Freq: Three times a day (TID) | SUBCUTANEOUS | Status: DC
  Administered 2015-05-24: 3 [IU] via SUBCUTANEOUS
  Administered 2015-05-25: 08:00:00 2 [IU] via SUBCUTANEOUS
  Filled 2015-05-24: qty 2
  Filled 2015-05-24: qty 3

## 2015-05-24 MED ORDER — PIOGLITAZONE HCL 15 MG PO TABS
15.0000 mg | ORAL_TABLET | Freq: Every day | ORAL | Status: DC
  Administered 2015-05-25: 15 mg via ORAL
  Filled 2015-05-24: qty 1

## 2015-05-24 MED ORDER — ASPIRIN EC 81 MG PO TBEC
81.0000 mg | DELAYED_RELEASE_TABLET | Freq: Every day | ORAL | Status: DC
  Administered 2015-05-25: 07:00:00 81 mg via ORAL
  Filled 2015-05-24: qty 1

## 2015-05-24 NOTE — ED Notes (Addendum)
Patient currently in MRI and to be transferred to room 111 when MRI complete.  Daleen Snook acception RN made aware of plan.

## 2015-05-24 NOTE — Progress Notes (Signed)
Per Dr Anselm Jungling discontinue metformin r/t ct scan

## 2015-05-24 NOTE — Plan of Care (Signed)
Problem: Education: Goal: Knowledge of patient specific risk factors addressed and post discharge goals established will improve Outcome: Progressing Has received General Education Handout. Oriented to Oncology unit. Instructed and Demonstrated how to use phone to call RN and CNA for Assistance. Verbalized and Demonstrated Understanding. Pain medication given with noted improvement.

## 2015-05-24 NOTE — ED Notes (Signed)
Patient brought in via EMS from work secondary to sudden onset of headache, confusion, and aphasia that started at 5.

## 2015-05-24 NOTE — H&P (Signed)
[facility] NAME: Christine Roberts    MR#:  [id number]  DATE OF BIRTH:  [birth year]  DATE OF ADMISSION:  05/24/2015  PRIMARY CARE PHYSICIAN: Loistine Chance, MD   REQUESTING/REFERRING PHYSICIAN: Carrie Mew  CHIEF COMPLAINT:   Chief Complaint  Patient presents with  . Code Stroke    HISTORY OF PRESENT ILLNESS: Christine Roberts  is a 69 y.o. female with a known history of diabetes, hypertension, hyperlipidemia, obesity- woke up completely normal today morning, got ready and went to her work, around 9:00 at her work and she suddenly started feeling hazziness in her right eye vision, also had headache, and when she tried to explain her problem she did not had clear thoughts. Concerned with this checked her blood pressure and it was very high. In ER her blood pressure was higher, CT of the head was negative, she had recovery in her thinking process, and her vision also improved significantly within few hours. Telemetry neurologic consult was done by ER, he suggested after examining her to admit to hospital for workup. Patient denies any similar symptoms in the past.  PAST MEDICAL HISTORY:   Past Medical History  Diagnosis Date  . Diabetes mellitus without complication ([place])   . Hypertension   . Hyperlipidemia   . Obesity   . Kidney stone   . Left shoulder pain   . Ovarian failure   . Vitamin B12 deficiency (non anemic)   . Osteoarthritis     PAST SURGICAL HISTORY:  Past Surgical History  Procedure Laterality Date  . Lithotripsy  12/2007  . Myringotomy Right 11/2009  . Cystoscopic      SOCIAL HISTORY:  Social History  Substance Use Topics  . Smoking status: Never Smoker   . Smokeless tobacco: Never Used  . Alcohol Use: No    FAMILY HISTORY:  Family History  Problem Relation Age of Onset  . Cancer Mother   . Diabetes Mother   . CAD Father     DRUG ALLERGIES:  Allergies  Allergen Reactions  . Latex Rash  .  Erythromycin     REVIEW OF SYSTEMS:   CONSTITUTIONAL: No fever, fatigue or weakness.  EYES: Positive for blurred vision.  EARS, NOSE, AND THROAT: No tinnitus or ear pain.  RESPIRATORY: No cough, shortness of breath, wheezing or hemoptysis.  CARDIOVASCULAR: No chest pain, orthopnea, edema.  GASTROINTESTINAL: No nausea, vomiting, diarrhea or abdominal pain.  GENITOURINARY: No dysuria, hematuria.  ENDOCRINE: No polyuria, nocturia,  HEMATOLOGY: No anemia, easy bruising or bleeding SKIN: No rash or lesion. MUSCULOSKELETAL: No joint pain or arthritis.   NEUROLOGIC: No tingling, numbness, weakness.  PSYCHIATRY: No anxiety or depression.   MEDICATIONS AT HOME:  Prior to Admission medications   Medication Sig Start Date End Date Taking? Authorizing Provider  atorvastatin (LIPITOR) 40 MG tablet Take 40 mg by mouth at bedtime. On Wednesday and Saturday   Yes Historical Provider, MD  calcium citrate (CALCITRATE - DOSED IN MG ELEMENTAL CALCIUM) 950 MG tablet Take 200 mg of elemental calcium by mouth daily.   Yes Historical Provider, MD  glucose blood (FREESTYLE LITE) test strip Use as instructed 04/18/15  Yes Steele Sizer, MD  insulin detemir (LEVEMIR) 100 UNIT/ML injection Inject 0.15 mLs (15 Units total) into the skin daily. Patient taking differently: Inject 5-10 Units into the skin See admin instructions. 10 units every morning and 5 units at bedtime 05/09/15  Yes Steele Sizer, MD  Lancets (FREESTYLE) lancets  Use as instructed 04/18/15  Yes Steele Sizer, MD  lisinopril (PRINIVIL,ZESTRIL) 40 MG tablet Take 1 tablet (40 mg total) by mouth daily. 03/21/15  Yes Steele Sizer, MD  meloxicam (MOBIC) 15 MG tablet Take 1 tablet (15 mg total) by mouth daily. 05/09/15  Yes Steele Sizer, MD  metFORMIN (GLUCOPHAGE) 1000 MG tablet Take 1 tablet (1,000 mg total) by mouth 2 (two) times daily. 03/21/15  Yes Steele Sizer, MD  pioglitazone (ACTOS) 15 MG tablet Take 1 tablet (15 mg total) by mouth daily.  03/21/15  Yes Steele Sizer, MD  aspirin EC 81 MG tablet Take 1 tablet (81 mg total) by mouth daily. Patient not taking: Reported on 05/24/2015 03/21/15   Steele Sizer, MD  fluconazole (DIFLUCAN) 150 MG tablet Take 1 tablet (150 mg total) by mouth every other day. Patient not taking: Reported on 05/24/2015 05/09/15   Steele Sizer, MD  HYDROcodone-acetaminophen [facility]) 10-325 MG tablet Take 1 tablet by mouth every 6 (six) hours as needed for moderate pain. Patient not taking: Reported on 05/24/2015 05/09/15   Steele Sizer, MD  omeprazole (PRILOSEC) 40 MG capsule Take 1 capsule (40 mg total) by mouth daily. Patient not taking: Reported on 05/24/2015 03/21/15   Steele Sizer, MD      PHYSICAL EXAMINATION:   VITAL SIGNS: Blood pressure 153/84, pulse 90, temperature 98.3 F (36.8 C), temperature source Oral, resp. rate 20, height 5\' 3"  (1.6 m), weight 88.451 kg (195 lb), SpO2 94 %.  GENERAL:  69 y.o.-year-old patient lying in the bed with no acute distress.  EYES: Pupils equal, round, reactive to light and accommodation. No scleral icterus. Extraocular muscles intact. Some loss of vision in upper and outer quadrant in right eye, but able to see clear during my exam. HEENT: Head atraumatic, normocephalic. Oropharynx and nasopharynx clear.  NECK:  Supple, no jugular venous distention. No thyroid enlargement, no tenderness.  LUNGS: Normal breath sounds bilaterally, no wheezing, rales,rhonchi or crepitation. No use of accessory muscles of respiration.  CARDIOVASCULAR: S1, S2 normal. No murmurs, rubs, or gallops.  ABDOMEN: Soft, nontender, nondistended. Bowel sounds present. No organomegaly or mass.  EXTREMITIES: No pedal edema, cyanosis, or clubbing.  NEUROLOGIC: Cranial nerves II through XII are intact. Muscle strength 5/5 in all extremities. Sensation intact. Gait not checked.  PSYCHIATRIC: The patient is alert and oriented x 3.  SKIN: No obvious rash, lesion, or ulcer.   LABORATORY PANEL:    CBC  Recent Labs Lab 05/24/15 1020  WBC 8.9  HGB 11.8*  HCT 36.5  PLT 300  MCV 83.3  MCH 27.0  MCHC 32.4  RDW 14.3  LYMPHSABS 2.1  MONOABS 0.5  EOSABS 0.3  BASOSABS 0.1   ------------------------------------------------------------------------------------------------------------------  Chemistries   Recent Labs Lab 05/24/15 1020  NA 139  K 3.6  CL 105  CO2 25  GLUCOSE 134*  BUN 14  CREATININE 0.56  CALCIUM 9.4  AST 16  ALT 15  ALKPHOS 77  BILITOT 0.6   ------------------------------------------------------------------------------------------------------------------ estimated creatinine clearance is 69 mL/min (by C-G formula based on Cr of 0.56). ------------------------------------------------------------------------------------------------------------------ No results for input(s): TSH, T4TOTAL, T3FREE, THYROIDAB in the last 72 hours.  Invalid input(s): FREET3   Coagulation profile  Recent Labs Lab 05/24/15 1020  INR 0.96   ------------------------------------------------------------------------------------------------------------------- No results for input(s): DDIMER in the last 72 hours. -------------------------------------------------------------------------------------------------------------------  Cardiac Enzymes No results for input(s): CKMB, TROPONINI, MYOGLOBIN in the last 168 hours.  Invalid input(s): CK ------------------------------------------------------------------------------------------------------------------ Invalid input(s): POCBNP  ---------------------------------------------------------------------------------------------------------------  Urinalysis    Component  Value Date/Time   COLORURINE STRAW* 05/24/2015 1044   COLORURINE Amber 08/04/2013 1850   APPEARANCEUR CLEAR* 05/24/2015 1044   APPEARANCEUR Hazy 08/04/2013 1850   LABSPEC 1.008 05/24/2015 1044   LABSPEC 1.012 08/04/2013 1850   PHURINE 5.0 05/24/2015  1044   PHURINE 6.0 08/04/2013 1850   GLUCOSEU NEGATIVE 05/24/2015 1044   GLUCOSEU >=500 08/04/2013 1850   HGBUR NEGATIVE 05/24/2015 1044   HGBUR 3+ 08/04/2013 1850   BILIRUBINUR NEGATIVE 05/24/2015 1044   BILIRUBINUR Negative 08/04/2013 [place] 05/24/2015 1044   KETONESUR Negative 08/04/2013 [place] 05/24/2015 1044   PROTEINUR 100 mg/dL 08/04/2013 1850   NITRITE NEGATIVE 05/24/2015 1044   NITRITE Positive 08/04/2013 1850   LEUKOCYTESUR NEGATIVE 05/24/2015 1044   LEUKOCYTESUR 1+ 08/04/2013 1850     RADIOLOGY: Ct Head Wo Contrast  05/24/2015  CLINICAL DATA:  Confusion since this morning, frontal headache, tremor for 6 months worse in morning, code stroke, aphasia, hypertension, diabetes mellitus EXAM: CT HEAD WITHOUT CONTRAST TECHNIQUE: Contiguous axial images were obtained from the base of the skull through the vertex without intravenous contrast. COMPARISON:  None ; correlation MRI brain 05/11/2010 FINDINGS: Mild atrophy. Normal ventricular morphology. No midline shift or mass effect. Beam hardening artifacts from skullbase. No definite intracranial hemorrhage, mass lesion, or evidence acute infarction. No extra-axial fluid collections. Visualized paranasal sinuses and mastoid air cells clear. Bones demineralized. IMPRESSION: No acute intracranial abnormalities. Findings called to Dr. Joni Fears on 05/24/2015 at 1040 hours. Electronically Signed   By: Lavonia Dana M.D.   On: 05/24/2015 10:40    EKG: Normal sinus rhythm  IMPRESSION AND PLAN: * TIA  We will monitor on telemetry, get her lipid panel and hemoglobin A1c.  Loading dose of aspirin is given by ER.  We'll allow permissive hypertension for today so we'll hold her hypertensive medications.  Telemetry medicine neurologist suggested to get CT angiogram of the brain and neck, MRI of the brain and echocardiogram.  We'll do frequent neuro checks.  As there is no physical weakness, we don't need  physical therapy evaluation.  Patient walked in ER.  * Hypertension  Medication currently is blood pressure is running around 150.  * Diabetes  Check hemoglobin A1c, continue home oral medications, keep monitoring on insulin sliding scale coverage.  * Hyperlipidemia  Check lipid panel, meanwhile continue statin.  * Chronic pain  Continue her pain medications as she is taking at home.   All the records are reviewed and case discussed with ED provider. Management plans discussed with the patient, family and they are in agreement.  CODE STATUS: Full   TOTAL TIME TAKING CARE OF THIS PATIENT: 50 minutes.  Findings and plans discussed with patient and her daughter who is present in the room, they understand and agree with the plan.  Vaughan Basta M.D on 05/24/2015   Between 7am to 6pm - Pager - [phone]  After 6pm go to [email or web address] - password [email or web address] [facility]  Office  [phone]  CC: Primary care physician; Loistine Chance, MD   Note: This dictation was prepared with Dragon dictation along with smaller phrase technology. Any transcriptional errors that result from this process are unintentional.

## 2015-05-24 NOTE — ED Provider Notes (Addendum)
[facility] Emergency Department Provider Note  ____________________________________________  Time seen: 10:15 AM on arrival by EMS  I have reviewed the triage vital signs and the nursing notes.   HISTORY  Chief Complaint Code Stroke    HPI Christine Roberts is a 69 y.o. female who complains of sudden onset of headache confusion and aphasia that started about 9:00 AM today. She was at work at the time and her coworkers noted her to have difficulty communicating and seeming to be disoriented. They called EMS around 9:30 due to the persistence of the symptoms, and on EMSs arrival around 10:00, the symptoms had actually started to improve. En route to the hospital the symptoms continued improving until on arrival here the patient is feeling almost back to baseline with only a mild headache. She also feels like she still has difficulty with some words but overall her speech is almost entirely back to normal.  Ever had anything like this before. She does have hypertension and insulin-dependent diabetes, hyperlipidemia.No chest pain shortness of breath back pain abdominal pain or syncope. Denies numbness tingling or weakness at present, no vision changes.     Past Medical History  Diagnosis Date  . Diabetes mellitus without complication ([place])   . Hypertension   . Hyperlipidemia   . Obesity   . Kidney stone   . Left shoulder pain   . Ovarian failure   . Vitamin B12 deficiency (non anemic)   . Osteoarthritis      Patient Active Problem List   Diagnosis Date Noted  . Senile purpura ([place]) 05/09/2015  . Benign essential HTN 03/18/2015  . Pain in shoulder 03/18/2015  . Dyslipidemia 03/18/2015  . History of surgery to major organs, presenting hazards to health 03/18/2015  . Hearing loss 03/18/2015  . Hiatal hernia 03/18/2015  . History of colon polyps 03/18/2015  . Calculus of kidney 03/18/2015  . B12 deficiency 03/18/2015  . Microalbuminuria 03/18/2015  .  Benign melanoma 03/18/2015  . Adult BMI 30+ 03/18/2015  . Arthritis, degenerative 03/18/2015  . Diabetes mellitus with nephropathy ([place]) 03/18/2015     Past Surgical History  Procedure Laterality Date  . Lithotripsy  12/2007  . Myringotomy Right 11/2009  . Cystoscopic       Current Outpatient Rx  Name  Route  Sig  Dispense  Refill  . aspirin EC 81 MG tablet   Oral   Take 1 tablet (81 mg total) by mouth daily.   90 tablet   0   . atorvastatin (LIPITOR) 40 MG tablet   Oral   Take by mouth.         . calcium citrate (CALCITRATE - DOSED IN MG ELEMENTAL CALCIUM) 950 MG tablet   Oral   Take by mouth.         . Cholecalciferol (VITAMIN D) 2000 UNITS CAPS   Oral   Take by mouth.         . Cyanocobalamin (B-12) 1000 MCG SUBL   Sublingual   Place under the tongue.         . fluconazole (DIFLUCAN) 150 MG tablet   Oral   Take 1 tablet (150 mg total) by mouth every other day.   3 tablet   0   . glucose blood (FREESTYLE LITE) test strip      Use as instructed   100 each   12     E 11.9   . HYDROcodone-acetaminophen (NORCO) 10-325 MG tablet   Oral  Take 1 tablet by mouth every 6 (six) hours as needed for moderate pain.   20 tablet   0   . insulin detemir (LEVEMIR) 100 UNIT/ML injection   Subcutaneous   Inject 0.15 mLs (15 Units total) into the skin daily.   10 mL   0   . Lancets (FREESTYLE) lancets      Use as instructed   100 each   12     E11.9   . lisinopril (PRINIVIL,ZESTRIL) 40 MG tablet   Oral   Take 1 tablet (40 mg total) by mouth daily.   90 tablet   3   . magnesium oxide (MAG-OX) 400 MG tablet   Oral   Take by mouth.         . meloxicam (MOBIC) 15 MG tablet   Oral   Take 1 tablet (15 mg total) by mouth daily.   30 tablet   0   . metFORMIN (GLUCOPHAGE) 1000 MG tablet   Oral   Take 1 tablet (1,000 mg total) by mouth 2 (two) times daily.   180 tablet   1   . omeprazole (PRILOSEC) 40 MG capsule   Oral   Take 1 capsule  (40 mg total) by mouth daily.   30 capsule   2   . pioglitazone (ACTOS) 15 MG tablet   Oral   Take 1 tablet (15 mg total) by mouth daily.   90 tablet   3      Allergies Latex and Erythromycin   Family History  Problem Relation Age of Onset  . Cancer Mother     Social History Social History  Substance Use Topics  . Smoking status: Never Smoker   . Smokeless tobacco: Never Used  . Alcohol Use: None    Review of Systems  Constitutional:   No fever or chills. No weight changes Eyes:   No blurry vision or double vision.  ENT:   No sore throat. Cardiovascular:   No chest pain. Respiratory:   No dyspnea or cough. Gastrointestinal:   Negative for abdominal pain, vomiting and diarrhea.  No BRBPR or melena. Genitourinary:   Negative for dysuria, urinary retention, bloody urine, or difficulty urinating. Musculoskeletal:   Negative for back pain. No joint swelling or pain. Skin:   Negative for rash. Neurological:   Positive for headaches, without focal weakness or numbness. Psychiatric:  No anxiety or depression.   Endocrine:  No hot/cold intolerance, changes in energy, or sleep difficulty.  10-point ROS otherwise negative.  ____________________________________________   PHYSICAL EXAM:  VITAL SIGNS: ED Triage Vitals  Enc Vitals Group     BP 05/24/15 1031 176/95 mmHg     Pulse Rate 05/24/15 1031 91     Resp 05/24/15 1031 18     Temp 05/24/15 1031 98.3 F (36.8 C)     Temp Source 05/24/15 1031 Oral     SpO2 05/24/15 1031 97 %     Weight 05/24/15 1031 195 lb (88.451 kg)     Height 05/24/15 1031 5\' 3"  (1.6 m)     Head Cir --      Peak Flow --      Pain Score --      Pain Loc --      Pain Edu? --      Excl. in [place]? --      Constitutional:   Alert and oriented. Well appearing and in no distress. Eyes:   No scleral icterus. No conjunctival pallor. PERRL. EOMI, no  nystagmus ENT   Head:   Normocephalic and atraumatic.   Nose:   No congestion/rhinnorhea.  No septal hematoma   Mouth/Throat:   MMM, no pharyngeal erythema. No peritonsillar mass. No uvula shift.   Neck:   No stridor. No SubQ emphysema. No meningismus. Hematological/Lymphatic/Immunilogical:   No cervical lymphadenopathy. Cardiovascular:   RRR. Normal and symmetric distal pulses are present in all extremities. No murmurs, rubs, or gallops. Respiratory:   Normal respiratory effort without tachypnea nor retractions. Breath sounds are clear and equal bilaterally. No wheezes/rales/rhonchi. Gastrointestinal:   Soft and nontender. No distention. There is no CVA tenderness.  No rebound, rigidity, or guarding. Genitourinary:   deferred Musculoskeletal:   Nontender with normal range of motion in all extremities. No joint effusions.  No lower extremity tenderness.  No edema. Neurologic:   Normal speech and language. Good memory. CN 2-10 normal except impaired upper visual fields in the right eye. Motor large muscle groups intact and symmetric. Positive pronator drift on the left side.Marland Kitchen  NIH stroke scale equals 1 .  Skin:    Skin is warm, dry and intact. No rash noted.  No petechiae, purpura, or bullae. Psychiatric:   Mood and affect are normal. Speech and behavior are normal. Patient exhibits appropriate insight and judgment.  ____________________________________________    LABS (pertinent positives/negatives) (all labs ordered are listed, but only abnormal results are displayed) Labs Reviewed  CBC - Abnormal; Notable for the following:    Hemoglobin 11.8 (*)    All other components within normal limits  COMPREHENSIVE METABOLIC PANEL - Abnormal; Notable for the following:    Glucose, Bld 134 (*)    All other components within normal limits  URINALYSIS COMPLETEWITH MICROSCOPIC ([facility] ONLY) - Abnormal; Notable for the following:    Color, Urine STRAW (*)    APPearance CLEAR (*)    Squamous Epithelial / LPF 0-5 (*)    All other components within normal limits  PROTIME-INR   APTT  DIFFERENTIAL  ETHANOL  TROPONIN I   ____________________________________________   EKG  Interpreted by me Normal sinus rhythm rate of 86, normal axis and intervals, poor R-wave progression in anterior precordial leads. Normal ST segments, normal T waves.  ____________________________________________    RADIOLOGY  CT head unremarkable  ____________________________________________   PROCEDURES   ____________________________________________   INITIAL IMPRESSION / ASSESSMENT AND PLAN / ED COURSE  Pertinent labs & imaging results that were available during my care of the patient were reviewed by me and considered in my medical decision making (see chart for details).  Patient presents with sudden onset headache and aphasia confusion which appears to be improving. No prior episodes such as TIA or stroke in the past although she does have multiple risk factors including age hypertension diabetes and hyperlipidemia. She does still have persistent neurologic deficit with the left pronator drift. Cranial nerves appear to be intact. We'll obtain neurologic consultation and plan for hospitalization for further evaluation and risk stratification. At this time due to improvement of symptoms and very low stroke scale, the patient is not a candidate for TPA.  ----------------------------------------- 11:38 AM on 05/24/2015 -----------------------------------------  Discussed with the neurology [facility] after their evaluation. They agree that the patient has likely had a left dominant hemispheric stroke that is likely to be small given the limitation of her deficit and the short-term improvement. They discussed TPA with the patient who declined at this time. Aspirin was given. Neurology recommends a CT angiogram of the head and neck and further stroke  workup with MRI and echo. ____________________________________________   FINAL CLINICAL IMPRESSION(S) / ED DIAGNOSES  Final diagnoses:   Acute ischemic stroke [facility])      Carrie Mew, MD 05/24/15 [place], MD 05/24/15 [place]  Carrie Mew, MD 05/24/15 1140

## 2015-05-24 NOTE — ED Notes (Signed)
Specialist on call assessing patient.

## 2015-05-24 NOTE — Care Management Obs Status (Signed)
[place] NOTIFICATION   Patient Details  Name: COUMBA FOSSEY MRN: [id number] Date of Birth: July 15, 1945   Medicare Observation Status Notification Given:  Yes Given to the patients daughter in ER.    Beau Fanny, RN 05/24/2015, 1:47 PM

## 2015-05-25 ENCOUNTER — Observation Stay
Admit: 2015-05-25 | Discharge: 2015-05-25 | Disposition: A | Discharge status: Home / Self Care | Payer: Commercial Managed Care - HMO | Attending: Internal Medicine | Admitting: Internal Medicine

## 2015-05-25 DIAGNOSIS — E669 Obesity, unspecified: Secondary | ICD-10-CM | POA: Diagnosis not present

## 2015-05-25 DIAGNOSIS — Z6835 Body mass index (BMI) 35.0-35.9, adult: Secondary | ICD-10-CM | POA: Diagnosis not present

## 2015-05-25 DIAGNOSIS — I10 Essential (primary) hypertension: Secondary | ICD-10-CM | POA: Diagnosis not present

## 2015-05-25 DIAGNOSIS — I639 Cerebral infarction, unspecified: Secondary | ICD-10-CM | POA: Diagnosis not present

## 2015-05-25 DIAGNOSIS — G459 Transient cerebral ischemic attack, unspecified: Secondary | ICD-10-CM | POA: Diagnosis not present

## 2015-05-25 DIAGNOSIS — E785 Hyperlipidemia, unspecified: Secondary | ICD-10-CM | POA: Diagnosis not present

## 2015-05-25 DIAGNOSIS — E119 Type 2 diabetes mellitus without complications: Secondary | ICD-10-CM | POA: Diagnosis not present

## 2015-05-25 DIAGNOSIS — G250 Essential tremor: Secondary | ICD-10-CM | POA: Diagnosis not present

## 2015-05-25 DIAGNOSIS — R51 Headache: Secondary | ICD-10-CM

## 2015-05-25 DIAGNOSIS — J90 Pleural effusion, not elsewhere classified: Secondary | ICD-10-CM | POA: Diagnosis not present

## 2015-05-25 DIAGNOSIS — G8929 Other chronic pain: Secondary | ICD-10-CM | POA: Diagnosis not present

## 2015-05-25 DIAGNOSIS — R519 Headache, unspecified: Secondary | ICD-10-CM

## 2015-05-25 LAB — BASIC METABOLIC PANEL
ANION GAP: 6 (ref 5–15)
BUN: 14 mg/dL (ref 6–20)
CHLORIDE: 106 mmol/L (ref 101–111)
CO2: 29 mmol/L (ref 22–32)
Calcium: 8.7 mg/dL — ABNORMAL LOW (ref 8.9–10.3)
Creatinine, Ser: 0.75 mg/dL (ref 0.44–1.00)
GFR calc non Af Amer: >60 mL/min (ref >60)
GLUCOSE: 178 mg/dL — AB (ref 65–99)
Potassium: 4.2 mmol/L (ref 3.5–5.1)
Sodium: 141 mmol/L (ref 135–145)

## 2015-05-25 LAB — CBC
HEMATOCRIT: 31.8 % — AB (ref 35.0–47.0)
HEMOGLOBIN: 10.5 g/dL — AB (ref 12.0–16.0)
MCH: 27.7 pg (ref 26.0–34.0)
MCHC: 33 g/dL (ref 32.0–36.0)
MCV: 83.9 fL (ref 80.0–100.0)
Platelets: 268 10*3/uL (ref 150–440)
RBC: 3.79 MIL/uL — AB (ref 3.80–5.20)
RDW: 14.4 % (ref 11.5–14.5)
WBC: 6.4 10*3/uL (ref 3.6–11.0)

## 2015-05-25 LAB — GLUCOSE, CAPILLARY
GLUCOSE-CAPILLARY: 158 mg/dL — AB (ref 65–99)
GLUCOSE-CAPILLARY: 178 mg/dL — AB (ref 65–99)

## 2015-05-25 MED ORDER — INSULIN DETEMIR 100 UNIT/ML ~~LOC~~ SOLN: 30.0000 [IU] | Freq: Every day | SUBCUTANEOUS | Status: DC

## 2015-05-25 NOTE — Consult Note (Signed)
Reason for Consult: TIA Referring Physician: [name] is an 69 y.o. female.  HPI:  69 yo RHD F presents to [facility] from work due to difficulty getting words out.  Christine Roberts reports waking up normal and going to work.  At work Christine Roberts developed a headache with some mild light sensitivity and then noted some sparkling lights around her R eye.  Christine Roberts then notes that Christine Roberts could not see to the R that well when Christine Roberts came to hospital.  Christine Roberts denies any nausea or vomiting.  Christine Roberts notes that Christine Roberts finally had difficulty getting her words out that lasted about one hour but Christine Roberts knew what Christine Roberts wanted to say.  Christine Roberts has never had this happen before.  Christine Roberts does have a hx of headaches as a teenager as does her two daughters.  Past Medical History  Diagnosis Date  . Diabetes mellitus without complication ([place])   . Hypertension   . Hyperlipidemia   . Obesity   . Kidney stone   . Left shoulder pain   . Ovarian failure   . Vitamin B12 deficiency (non anemic)   . Osteoarthritis     Past Surgical History  Procedure Laterality Date  . Lithotripsy  12/2007  . Myringotomy Right 11/2009  . Cystoscopic      Family History  Problem Relation Age of Onset  . Cancer Mother   . Diabetes Mother   . CAD Father     Social History:  reports that Christine Roberts has never smoked. Christine Roberts has never used smokeless tobacco. Christine Roberts reports that Christine Roberts does not drink alcohol or use illicit drugs.  Allergies:  Allergies  Allergen Reactions  . Latex Rash  . Erythromycin     Medications: personally reviewed by me as per chart  Results for orders placed or performed during the hospital encounter of 05/24/15 (from the past 48 hour(s))  Ethanol     Status: None   Collection Time: 05/24/15 10:20 AM  Result Value Ref Range   Alcohol, Ethyl (B) <5 <5 mg/dL    Comment:        LOWEST DETECTABLE LIMIT FOR SERUM ALCOHOL IS 5 mg/dL FOR MEDICAL PURPOSES ONLY   Protime-INR     Status: None   Collection Time: 05/24/15 10:20 AM  Result Value Ref  Range   Prothrombin Time 13.0 11.4 - 15.0 seconds   INR 0.96   APTT     Status: None   Collection Time: 05/24/15 10:20 AM  Result Value Ref Range   aPTT 27 24 - 36 seconds  CBC     Status: Abnormal   Collection Time: 05/24/15 10:20 AM  Result Value Ref Range   WBC 8.9 3.6 - 11.0 K/uL   RBC 4.38 3.80 - 5.20 MIL/uL   Hemoglobin 11.8 (L) 12.0 - 16.0 g/dL   HCT 36.5 35.0 - 47.0 %   MCV 83.3 80.0 - 100.0 fL   MCH 27.0 26.0 - 34.0 pg   MCHC 32.4 32.0 - 36.0 g/dL   RDW 14.3 11.5 - 14.5 %   Platelets 300 150 - 440 K/uL  Differential     Status: None   Collection Time: 05/24/15 10:20 AM  Result Value Ref Range   Neutrophils Relative % 67 %   Neutro Abs 6.0 1.4 - 6.5 K/uL   Lymphocytes Relative 23 %   Lymphs Abs 2.1 1.0 - 3.6 K/uL   Monocytes Relative 6 %   Monocytes Absolute 0.5 0.2 - 0.9 K/uL   Eosinophils  Relative 3 %   Eosinophils Absolute 0.3 0 - 0.7 K/uL   Basophils Relative 1 %   Basophils Absolute 0.1 0 - 0.1 K/uL  Comprehensive metabolic panel     Status: Abnormal   Collection Time: 05/24/15 10:20 AM  Result Value Ref Range   Sodium 139 135 - 145 mmol/L   Potassium 3.6 3.5 - 5.1 mmol/L   Chloride 105 101 - 111 mmol/L   CO2 25 22 - 32 mmol/L   Glucose, Bld 134 (H) 65 - 99 mg/dL   BUN 14 6 - 20 mg/dL   Creatinine, Ser 0.56 0.44 - 1.00 mg/dL   Calcium 9.4 8.9 - 10.3 mg/dL   Total Protein 7.4 6.5 - 8.1 g/dL   Albumin 3.9 3.5 - 5.0 g/dL   AST 16 15 - 41 U/L   ALT 15 14 - 54 U/L   Alkaline Phosphatase 77 38 - 126 U/L   Total Bilirubin 0.6 0.3 - 1.2 mg/dL   GFR calc non Af Amer >60 >60 mL/min   GFR calc Af Amer >60 >60 mL/min    Comment: (NOTE) The eGFR has been calculated using the CKD EPI equation. This calculation has not been validated in all clinical situations. eGFR's persistently <60 mL/min signify possible Chronic Kidney Disease.    Anion gap 9 5 - 15  Troponin I     Status: None   Collection Time: 05/24/15 10:20 AM  Result Value Ref Range   Troponin I  <0.03 <0.031 ng/mL    Comment:        NO INDICATION OF MYOCARDIAL INJURY.   Hemoglobin A1c     Status: Abnormal   Collection Time: 05/24/15 10:20 AM  Result Value Ref Range   Hgb A1c MFr Bld 9.8 (H) 4.0 - 6.0 %  Lipid panel     Status: None   Collection Time: 05/24/15 10:20 AM  Result Value Ref Range   Cholesterol 183 0 - 200 mg/dL   Triglycerides 116 <150 mg/dL   HDL 66 >40 mg/dL   Total CHOL/HDL Ratio 2.8 RATIO   VLDL 23 0 - 40 mg/dL   LDL Cholesterol 94 0 - 99 mg/dL    Comment:        Total Cholesterol/HDL:CHD Risk Coronary Heart Disease Risk Table                     Men   Women  1/2 Average Risk   3.4   3.3  Average Risk       5.0   4.4  2 X Average Risk   9.6   7.1  3 X Average Risk  23.4   11.0        Use the calculated Patient Ratio above and the CHD Risk Table to determine the patient's CHD Risk.        ATP III CLASSIFICATION (LDL):  <100     mg/dL   Optimal  100-129  mg/dL   Near or Above                    Optimal  130-159  mg/dL   Borderline  160-189  mg/dL   High  >190     mg/dL   Very High   Urinalysis complete, with microscopic ([facility] only)     Status: Abnormal   Collection Time: 05/24/15 10:44 AM  Result Value Ref Range   Color, Urine STRAW (A) YELLOW   APPearance CLEAR (A) CLEAR  Glucose, UA NEGATIVE NEGATIVE mg/dL   Bilirubin Urine NEGATIVE NEGATIVE   Ketones, ur NEGATIVE NEGATIVE mg/dL   Specific Gravity, Urine 1.008 1.005 - 1.030   Hgb urine dipstick NEGATIVE NEGATIVE   pH 5.0 5.0 - 8.0   Protein, ur NEGATIVE NEGATIVE mg/dL   Nitrite NEGATIVE NEGATIVE   Leukocytes, UA NEGATIVE NEGATIVE   RBC / HPF NONE SEEN 0 - 5 RBC/hpf   WBC, UA 0-5 0 - 5 WBC/hpf   Bacteria, UA NONE SEEN NONE SEEN   Squamous Epithelial / LPF 0-5 (A) NONE SEEN   Mucous PRESENT   Glucose, capillary     Status: None   Collection Time: 05/24/15  9:23 PM  Result Value Ref Range   Glucose-Capillary 99 65 - 99 mg/dL   Comment 1 Notify RN   Basic metabolic panel     Status:  Abnormal   Collection Time: 05/25/15  5:30 AM  Result Value Ref Range   Sodium 141 135 - 145 mmol/L   Potassium 4.2 3.5 - 5.1 mmol/L   Chloride 106 101 - 111 mmol/L   CO2 29 22 - 32 mmol/L   Glucose, Bld 178 (H) 65 - 99 mg/dL   BUN 14 6 - 20 mg/dL   Creatinine, Ser 0.75 0.44 - 1.00 mg/dL   Calcium 8.7 (L) 8.9 - 10.3 mg/dL   GFR calc non Af Amer >60 >60 mL/min   GFR calc Af Amer >60 >60 mL/min    Comment: (NOTE) The eGFR has been calculated using the CKD EPI equation. This calculation has not been validated in all clinical situations. eGFR's persistently <60 mL/min signify possible Chronic Kidney Disease.    Anion gap 6 5 - 15  CBC     Status: Abnormal   Collection Time: 05/25/15  5:30 AM  Result Value Ref Range   WBC 6.4 3.6 - 11.0 K/uL   RBC 3.79 (L) 3.80 - 5.20 MIL/uL   Hemoglobin 10.5 (L) 12.0 - 16.0 g/dL   HCT 31.8 (L) 35.0 - 47.0 %   MCV 83.9 80.0 - 100.0 fL   MCH 27.7 26.0 - 34.0 pg   MCHC 33.0 32.0 - 36.0 g/dL   RDW 14.4 11.5 - 14.5 %   Platelets 268 150 - 440 K/uL  Glucose, capillary     Status: Abnormal   Collection Time: 05/25/15  7:42 AM  Result Value Ref Range   Glucose-Capillary 158 (H) 65 - 99 mg/dL    Ct Angio Head W/cm &/or Wo Cm  05/24/2015  CLINICAL DATA:  69 year old female with sudden onset headache, confusion, loss of speech at 0900 hours. Symptoms have improved. Initial encounter. EXAM: CT ANGIOGRAPHY HEAD AND NECK TECHNIQUE: Multidetector CT imaging of the head and neck was performed using the standard protocol during bolus administration of intravenous contrast. Multiplanar CT image reconstructions and MIPs were obtained to evaluate the vascular anatomy. Carotid stenosis measurements (when applicable) are obtained utilizing NASCET criteria, using the distal internal carotid diameter as the denominator. CONTRAST:  66m OMNIPAQUE IOHEXOL 350 MG/ML SOLN COMPARISON:  Head CT without contrast 1029 hours today. Brain MRI 05/11/2010. FINDINGS: CTA NECK  Skeleton: No acute osseous abnormality identified. Visualized paranasal sinuses and mastoids are clear. Other neck: Small loculated appearing bilateral pleural effusions. Negative visualized lung parenchyma. No superior mediastinal lymphadenopathy. Thyroid, larynx, pharynx, parapharyngeal spaces, retropharyngeal space, sublingual space, submandibular glands, and parotid glands are within normal limits. No cervical lymphadenopathy. Aortic arch: 3 vessel arch configuration. No great vessel origin stenosis. Tortuous proximal  great vessels, especially the proximal left CCA. Right carotid system: Mildly tortuous proximal right CCA. Normal right carotid bifurcation. Normal cervical right ICA. Left carotid system: Tortuous proximal left CCA with a kinked appearance in the left superior mediastinum (series 4, image 59). Otherwise negative left CCA. Normal left carotid bifurcation. Negative cervical left ICA. Vertebral arteries:No proximal subclavian artery stenosis. Normal vertebral artery origins. Tortuous bilateral V1 segments. The left vertebral artery is mildly dominant throughout the neck. No vertebral artery stenosis. CTA HEAD Posterior circulation: No distal vertebral artery stenosis, the left is mildly dominant. Dominant appearing AICA is. Tortuous but otherwise normal vertebrobasilar junction. No basilar artery stenosis. Normal SCA origins. Fetal type bilateral PCA origins, more so the left. Tortuous right P1 segment. Bilateral PCA branches are within normal limits. Anterior circulation: Tortuous bilateral ICA siphons. No siphon stenosis. Mild cavernous segment calcified plaque mostly on the right. Ophthalmic and posterior communicating artery origins are normal. Both carotid termini are patent. Bilateral ACA and MCA origins are normal. Mildly dominant right A1 segment. Diminutive or absent anterior communicating artery. Bilateral ACA branches are within normal limits. Left MCA M1 segment, bifurcation, and left MCA  branches are within normal limits. Right MCA M1 segment, bifurcation, and right MCA branches are within normal limits. Venous sinuses: Patent. Anatomic variants: Fetal type PCA origins greater on the left. Mildly dominant left vertebral artery and right ACA A1 segment. Delayed phase: Bilateral gray-white matter differentiation stable since this morning with no cortically based infarct identified. No intracranial mass effect. No abnormal enhancement identified. IMPRESSION: 1. Tortuous proximal great vessels and to a lesser extent ICA siphons. No atherosclerosis or stenosis in the neck. Minimal intracranial atherosclerosis with no intracranial stenosis or circle of Willis branch occlusion identified. 2. Stable and negative CT appearance of the brain. 3. Small loculated appearing bilateral pleural effusions. Recommend PA and lateral chest radiographs. Electronically Signed   By: Genevie Ann M.D.   On: 05/24/2015 13:33   Ct Head Wo Contrast  05/24/2015  CLINICAL DATA:  Confusion since this morning, frontal headache, tremor for 6 months worse in morning, code stroke, aphasia, hypertension, diabetes mellitus EXAM: CT HEAD WITHOUT CONTRAST TECHNIQUE: Contiguous axial images were obtained from the base of the skull through the vertex without intravenous contrast. COMPARISON:  None ; correlation MRI brain 05/11/2010 FINDINGS: Mild atrophy. Normal ventricular morphology. No midline shift or mass effect. Beam hardening artifacts from skullbase. No definite intracranial hemorrhage, mass lesion, or evidence acute infarction. No extra-axial fluid collections. Visualized paranasal sinuses and mastoid air cells clear. Bones demineralized. IMPRESSION: No acute intracranial abnormalities. Findings called to Dr. Joni Fears on 05/24/2015 at 1040 hours. Electronically Signed   By: Lavonia Dana M.D.   On: 05/24/2015 10:40   Ct Angio Neck W/cm &/or Wo/cm  05/24/2015  CLINICAL DATA:  69 year old female with sudden onset headache,  confusion, loss of speech at 0900 hours. Symptoms have improved. Initial encounter. EXAM: CT ANGIOGRAPHY HEAD AND NECK TECHNIQUE: Multidetector CT imaging of the head and neck was performed using the standard protocol during bolus administration of intravenous contrast. Multiplanar CT image reconstructions and MIPs were obtained to evaluate the vascular anatomy. Carotid stenosis measurements (when applicable) are obtained utilizing NASCET criteria, using the distal internal carotid diameter as the denominator. CONTRAST:  38m OMNIPAQUE IOHEXOL 350 MG/ML SOLN COMPARISON:  Head CT without contrast 1029 hours today. Brain MRI 05/11/2010. FINDINGS: CTA NECK Skeleton: No acute osseous abnormality identified. Visualized paranasal sinuses and mastoids are clear. Other neck: Small loculated appearing bilateral  pleural effusions. Negative visualized lung parenchyma. No superior mediastinal lymphadenopathy. Thyroid, larynx, pharynx, parapharyngeal spaces, retropharyngeal space, sublingual space, submandibular glands, and parotid glands are within normal limits. No cervical lymphadenopathy. Aortic arch: 3 vessel arch configuration. No great vessel origin stenosis. Tortuous proximal great vessels, especially the proximal left CCA. Right carotid system: Mildly tortuous proximal right CCA. Normal right carotid bifurcation. Normal cervical right ICA. Left carotid system: Tortuous proximal left CCA with a kinked appearance in the left superior mediastinum (series 4, image 59). Otherwise negative left CCA. Normal left carotid bifurcation. Negative cervical left ICA. Vertebral arteries:No proximal subclavian artery stenosis. Normal vertebral artery origins. Tortuous bilateral V1 segments. The left vertebral artery is mildly dominant throughout the neck. No vertebral artery stenosis. CTA HEAD Posterior circulation: No distal vertebral artery stenosis, the left is mildly dominant. Dominant appearing AICA is. Tortuous but otherwise  normal vertebrobasilar junction. No basilar artery stenosis. Normal SCA origins. Fetal type bilateral PCA origins, more so the left. Tortuous right P1 segment. Bilateral PCA branches are within normal limits. Anterior circulation: Tortuous bilateral ICA siphons. No siphon stenosis. Mild cavernous segment calcified plaque mostly on the right. Ophthalmic and posterior communicating artery origins are normal. Both carotid termini are patent. Bilateral ACA and MCA origins are normal. Mildly dominant right A1 segment. Diminutive or absent anterior communicating artery. Bilateral ACA branches are within normal limits. Left MCA M1 segment, bifurcation, and left MCA branches are within normal limits. Right MCA M1 segment, bifurcation, and right MCA branches are within normal limits. Venous sinuses: Patent. Anatomic variants: Fetal type PCA origins greater on the left. Mildly dominant left vertebral artery and right ACA A1 segment. Delayed phase: Bilateral gray-white matter differentiation stable since this morning with no cortically based infarct identified. No intracranial mass effect. No abnormal enhancement identified. IMPRESSION: 1. Tortuous proximal great vessels and to a lesser extent ICA siphons. No atherosclerosis or stenosis in the neck. Minimal intracranial atherosclerosis with no intracranial stenosis or circle of Willis branch occlusion identified. 2. Stable and negative CT appearance of the brain. 3. Small loculated appearing bilateral pleural effusions. Recommend PA and lateral chest radiographs. Electronically Signed   By: Genevie Ann M.D.   On: 05/24/2015 13:33   Mr Brain Wo Contrast  05/24/2015  CLINICAL DATA:  69 year old female with sudden onset headache, confusion, loss of speech at 0900 hours. Symptoms have improved. Initial encounter. EXAM: MRI HEAD WITHOUT CONTRAST TECHNIQUE: Multiplanar, multiecho pulse sequences of the brain and surrounding structures were obtained without intravenous contrast.  COMPARISON:  CTA head and neck 1308 hours today. FINDINGS: Cerebral volume is within normal limits for age. No restricted diffusion to suggest acute infarction. No midline shift, mass effect, evidence of mass lesion, ventriculomegaly, extra-axial collection or acute intracranial hemorrhage. Cervicomedullary junction and pituitary are within normal limits. Major intracranial vascular flow voids are preserved. Mild for age scattered cerebral white matter T2 and FLAIR hyperintensity. No cortical encephalomalacia or chronic cerebral blood products. Deep gray matter nuclei, brainstem, and cerebellum are within normal limits. Right mastoid effusion. Negative nasopharynx. Left mastoids are clear. Mild paranasal sinus mucosal thickening. Negative orbit and scalp soft tissues. Negative visualized cervical spine. Normal bone marrow signal. IMPRESSION: 1.  No acute intracranial abnormality. 2. Mild for age nonspecific cerebral white matter signal changes. 3. Right mastoid effusion. Electronically Signed   By: Genevie Ann M.D.   On: 05/24/2015 14:17    Review of Systems  Constitutional: Negative.   HENT: Negative for congestion, ear discharge, ear pain, hearing loss,  nosebleeds, sore throat and tinnitus.   Eyes: Positive for blurred vision and photophobia. Negative for double vision, pain, discharge and redness.  Respiratory: Negative.  Negative for stridor.   Cardiovascular: Negative.   Gastrointestinal: Negative.   Genitourinary: Negative.   Musculoskeletal: Negative.   Skin: Negative.   Neurological: Positive for tremors, speech change and headaches. Negative for dizziness, tingling, sensory change, focal weakness, seizures and loss of consciousness.  Endo/Heme/Allergies: Bruises/bleeds easily.  Psychiatric/Behavioral: Negative.    Blood pressure 130/57, pulse 75, temperature 98.2 F (36.8 C), temperature source Oral, resp. rate 18, height _0  (1.575 m), weight 88.905 kg (196 lb), SpO2 96 %. Physical Exam   Nursing note and vitals reviewed. Constitutional: Christine Roberts is oriented to person, place, and time. Christine Roberts appears well-developed and well-nourished. No distress.  HENT:  Head: Normocephalic and atraumatic.  Right Ear: External ear normal.  Left Ear: External ear normal.  Nose: Nose normal.  Mouth/Throat: Oropharynx is clear and moist.  Eyes: Conjunctivae and EOM are normal. Pupils are equal, round, and reactive to light. No scleral icterus.  Neck: Normal range of motion. Neck supple.  Cardiovascular: Normal rate, regular rhythm, normal heart sounds and intact distal pulses.   No murmur heard. Respiratory: Effort normal and breath sounds normal. No respiratory distress.  GI: Soft. Bowel sounds are normal. Christine Roberts exhibits no distension.  Musculoskeletal: Normal range of motion. Christine Roberts exhibits no edema.  Neurological: Christine Roberts is alert and oriented to person, place, and time. Christine Roberts has normal reflexes. Christine Roberts displays normal reflexes. No cranial nerve deficit. Christine Roberts exhibits normal muscle tone. Coordination normal.  Skin: Skin is dry. Christine Roberts is not diaphoretic.  Psychiatric: Christine Roberts has a normal mood and affect.   MRI of brain personally reviewed by me and shows trace white matter changes  Assessment/Plan: 1.  Probable complex migraine-  Pt does have history of migraines but this could also be a complex partial seizure;  Pt does not report normal headaches anymore 2.  Essential tremor-  Mild symptomatic -  Continue ASA 52m daily -  PRN Naproxen 5022mBID for headache -  Needs better DM control with goal A1c < 6 -  No preventative medications at this time -  Will sign off, please call with questions -  [facility] or sooner if this develops again  SMLaguna BeachMATTHEW 05/25/2015, 9:02 AM

## 2015-05-25 NOTE — Progress Notes (Signed)
Pt being discharged home at this time, daughter at bedside, discharge instructions reviewed with pt and daughter states understanding, also discussed importance of BS control and lowering HGBA1C, states understanding, pt with no noted complaints at discharge

## 2015-05-25 NOTE — Evaluation (Signed)
Physical Therapy Evaluation Patient Details Name: Christine Roberts MRN: [id number] DOB: 10-26-1945 Today's Date: 05/25/2015   History of Present Illness  presented to ER after acute onset of R-sided visual deficit, HA and elevated BP; admitted for TIA/CVA work-up.  Head CT, MRI negative for acute change.  Patient reporting all symptoms have fully resolved at this time.  Clinical Impression  Upon evaluation, patient alert and oriented; follows all commands and demonstrates good safety awareness/insight. Demonstrates strength, ROM and sensation grossly WFL and symmetrical without focal neurological deficits appreciated.  Patient subjectively reports that all initial symptoms have fully resolved at this time.  Able to complete bed mobility, sit/stand, basic transfers and gait (220') without assist device, indep.  Standing functional reach >8"; able to retrieve item from floor safely and easily.  Good gait speed; scores 12/12 on modified DGI--all indicative of very minimal fall risk with functional activity. Patient currently without need for skilled PT services.  Appears steady and safe with all mobility, performing all activities at baseline level of functionally indep.  Will complete initial order at this time; please re-consult should needs change.     Follow Up Recommendations No PT follow up    Equipment Recommendations       Recommendations for Other Services       Precautions / Restrictions Precautions Precautions: None Restrictions Weight Bearing Restrictions: No      Mobility  Bed Mobility Overal bed mobility: Independent                Transfers Overall transfer level: Independent                  Ambulation/Gait Ambulation/Gait assistance: Modified independent (Device/Increase time) Ambulation Distance (Feet): 220 Feet Assistive device: None   Gait velocity: 10' walk time, 4 seconds   General Gait Details: reciprocal stepping pattern with good step  height/length; good trunk rotation and reciprocal arm swing.  Able to complete dynamic gait components without LOB or safety concern (Modified DGI 12/12)  Stairs            Wheelchair Mobility    Modified Rankin (Stroke Patients Only)       Balance Overall balance assessment: Independent (standing functional reach >8" with good stability; able to retrieve item from floor without difficulty or LOb)                               Standardized Balance Assessment Standardized Balance Assessment :  (Modified DGI 12/12)           Pertinent Vitals/Pain Pain Assessment: No/denies pain    Home Living Family/patient expects to be discharged to:: Private residence Living Arrangements: Other relatives (grandson) Available Help at Discharge: Family Type of Home: House Home Access: Stairs to enter Entrance Stairs-Rails: Right Entrance Stairs-Number of Steps: 4 Home Layout: Laundry or work area in basement;One level Home Equipment: None      Prior Function Level of Independence: Independent         Comments: Indep with all household/community mobility; denies fall history.  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    Extremity/Trunk Assessment   Upper Extremity Assessment: Overall WFL for tasks assessed           Lower Extremity Assessment: Overall WFL for tasks assessed (grossly 4+ to 5/5 throughout, symmetrical; sensation intact; coordination intact)  Communication   Communication: No difficulties  Cognition Arousal/Alertness: Awake/alert Behavior During Therapy: WFL for tasks assessed/performed Overall Cognitive Status: Within Functional Limits for tasks assessed                      General Comments      Exercises        Assessment/Plan    PT Assessment Patent does not need any further PT services  PT Diagnosis     PT Problem List    PT Treatment Interventions      PT Goals (Current goals can be found in the Care Plan section) Acute Rehab PT Goals Patient Stated Goal: "to go home" PT Goal Formulation: All assessment and education complete, DC therapy    Frequency     Barriers to discharge        Co-evaluation               End of Session   Activity Tolerance: Patient tolerated treatment well Patient left: in bed;with call bell/phone within reach      Functional Assessment Tool Used: clinical judgment Functional Limitation: Mobility: Walking and moving around Mobility: Walking and Moving Around Current Status [phone]): 0 percent impaired, limited or restricted Mobility: Walking and Moving Around Goal Status [id number]): 0 percent impaired, limited or restricted Mobility: Walking and Moving Around Discharge Status [phone]): 0 percent impaired, limited or restricted    Time: [id number] PT Time Calculation (min) (ACUTE ONLY): 10 min   Charges:   PT Evaluation $Initial PT Evaluation Tier I: 1 Procedure     PT G Codes:   PT G-Codes **NOT FOR INPATIENT CLASS** Functional Assessment Tool Used: clinical judgment Functional Limitation: Mobility: Walking and moving around Mobility: Walking and Moving Around Current Status [id number]): 0 percent impaired, limited or restricted Mobility: Walking and Moving Around Goal Status [id number]): 0 percent impaired, limited or restricted Mobility: Walking and Moving Around Discharge Status [id number]): 0 percent impaired, limited or restricted   Rhythm Gubbels H. Owens Shark, PT, DPT, NCS 05/25/2015, 11:03 AM [phone]

## 2015-05-25 NOTE — Discharge Planning (Signed)
       To Whom It May Concern:      Mrs. Christine Roberts was hospitalized at [facility] from the 16th to 17th of November 2016. She was advised by me to return back to work at full capacity on the 21st of November, 2016. Thank you for your understanding.      Sincerely,  Theodoro Grist, MD

## 2015-05-25 NOTE — Progress Notes (Signed)
*  PRELIMINARY RESULTS* Echocardiogram 2D Echocardiogram has been performed.  Christine Roberts 05/25/2015, 9:59 AM

## 2015-05-26 ENCOUNTER — Telehealth: Payer: Self-pay | Admitting: Family Medicine

## 2015-05-26 NOTE — Telephone Encounter (Signed)
Called patient on 05/26/2015 @ 5:05pm to check to see how see was doing. No answer so I left patient a voice message to call our office.

## 2015-05-28 NOTE — Discharge Summary (Signed)
[facility] NAME: Christine Roberts    MR#:  [id number]  DATE OF BIRTH:  1945-07-23  DATE OF ADMISSION:  05/24/2015 ADMITTING PHYSICIAN: Vaughan Basta, MD  DATE OF DISCHARGE: 05/25/2015 12:50 PM  PRIMARY CARE PHYSICIAN: Loistine Chance, MD     ADMISSION DIAGNOSIS:  CVA (cerebral infarction) [I63.9] Stroke (cerebrum) (HCC) [I63.9] Acute ischemic stroke ([place]) [I63.9]  DISCHARGE DIAGNOSIS:  Principal Problem:   TIA (transient ischemic attack) Active Problems:   Headache   Malignant essential hypertension   Diabetes mellitus ([place])   SECONDARY DIAGNOSIS:   Past Medical History  Diagnosis Date  . Diabetes mellitus without complication ([place])   . Hypertension   . Hyperlipidemia   . Obesity   . Kidney stone   . Left shoulder pain   . Ovarian failure   . Vitamin B12 deficiency (non anemic)   . Osteoarthritis     .pro HOSPITAL COURSE:  Christine Roberts is a 69 y.o. female with a known history of diabetes, hypertension, hyperlipidemia, obesity who woke up normal in the morning of admission, got ready and went to her work, but around 9:00  she suddenly started feeling hazziness in her right eye vision, had headache, and when she tried to explain her problem she did not had clear thoughts. Concerned with this she checked her blood pressure and it was very high. In ER her blood pressure was higher, close to [id number] systolic, CT of the head was negative, she had recovery in her thinking process, and her vision also improved significantly within few hours. Telemetry neurologic consult was done by ER, suggested to admit to hospital for workup. Patient denied similar symptoms in the past. She was admitted to the hospital, had Echo, unremarkable, CT angiogram of neck and head, unremarkable, brain MRI, normal. She was seen by neurologist who felt it probably was complicated migraine, but, since TIA could not have been completely rule out,  aspirin and Lipitor therapy was recommended.  Discussion by problem: 1. Complicated migraine vs TIA, aspirin and Lipitor, resolved, no abnormalities on brain MRI, CTA of head and neck, CT head, Echo, follow up with PCP as outpagtient for risk modification 2. DM, Hgb a1c 9.8, poorly controled, advancing insulin , follow hGb a1c in 3 months, may benefit from lifestyle intervention 3. Complicated migraine, asa, neurology follow up as  outpatient, if needed 4 Malignant essential HTN, lisinopril, follow BP colsely and advance as needed  DISCHARGE CONDITIONS:   fair  CONSULTS OBTAINED:     DRUG ALLERGIES:   Allergies  Allergen Reactions  . Latex Rash  . Erythromycin     DISCHARGE MEDICATIONS:   Discharge Medication List as of 05/25/2015 12:40 PM    CONTINUE these medications which have CHANGED   Details  insulin detemir (LEVEMIR) 100 UNIT/ML injection Inject 0.3 mLs (30 Units total) into the skin daily., Starting 05/25/2015, Until Discontinued, Normal      CONTINUE these medications which have NOT CHANGED   Details  atorvastatin (LIPITOR) 40 MG tablet Take 40 mg by mouth at bedtime. On Wednesday and Saturday, Until Discontinued, Historical Med    calcium citrate (CALCITRATE - DOSED IN MG ELEMENTAL CALCIUM) 950 MG tablet Take 200 mg of elemental calcium by mouth daily., Until Discontinued, Historical Med    glucose blood (FREESTYLE LITE) test strip Use as instructed, Normal    Lancets (FREESTYLE) lancets Use as instructed, Normal    lisinopril (PRINIVIL,ZESTRIL) 40 MG tablet Take 1 tablet (  40 mg total) by mouth daily., Starting 03/21/2015, Until Discontinued, Normal    meloxicam (MOBIC) 15 MG tablet Take 1 tablet (15 mg total) by mouth daily., Starting 05/09/2015, Until Discontinued, Normal    metFORMIN (GLUCOPHAGE) 1000 MG tablet Take 1 tablet (1,000 mg total) by mouth 2 (two) times daily., Starting 03/21/2015, Until Discontinued, Normal    pioglitazone (ACTOS) 15 MG tablet Take  1 tablet (15 mg total) by mouth daily., Starting 03/21/2015, Until Discontinued, Normal    aspirin EC 81 MG tablet Take 1 tablet (81 mg total) by mouth daily., Starting 03/21/2015, Until Discontinued, OTC    fluconazole (DIFLUCAN) 150 MG tablet Take 1 tablet (150 mg total) by mouth every other day., Starting 05/09/2015, Until Discontinued, Normal    HYDROcodone-acetaminophen (NORCO) 10-325 MG tablet Take 1 tablet by mouth every 6 (six) hours as needed for moderate pain., Starting 05/09/2015, Until Discontinued, Print    omeprazole (PRILOSEC) 40 MG capsule Take 1 capsule (40 mg total) by mouth daily., Starting 03/21/2015, Until Discontinued, Print         DISCHARGE INSTRUCTIONS:    Follow up with PCP  If you experience worsening of your admission symptoms, develop shortness of breath, life threatening emergency, suicidal or homicidal thoughts you must seek medical attention immediately by calling 911 or calling your MD immediately  if symptoms less severe.  You Must read complete instructions/literature along with all the possible adverse reactions/side effects for all the Medicines you take and that have been prescribed to you. Take any new Medicines after you have completely understood and accept all the possible adverse reactions/side effects.   Please note  You were cared for by a hospitalist during your hospital stay. If you have any questions about your discharge medications or the care you received while you were in the hospital after you are discharged, you can call the unit and asked to speak with the hospitalist on call if the hospitalist that took care of you is not available. Once you are discharged, your primary care physician will handle any further medical issues. Please note that NO REFILLS for any discharge medications will be authorized once you are discharged, as it is imperative that you return to your primary care physician (or establish a relationship with a primary care  physician if you do not have one) for your aftercare needs so that they can reassess your need for medications and monitor your lab values.    Today   CHIEF COMPLAINT:   Chief Complaint  Patient presents with  . Code Stroke    HISTORY OF PRESENT ILLNESS:  Christine Roberts  is a 69 y.o. female with a known history of diabetes, HTN, migraine HA,hyperlipidemia, obesity who woke up normal in the morning of admission, got ready and went to her work, but around 9:00  she suddenly started feeling hazziness in her right eye vision, had headache, and when she tried to explain her problem she did not had clear thoughts. Concerned with this she checked her blood pressure and it was very high. In ER her blood pressure was higher, close to [id number] systolic, CT of the head was negative, she had recovery in her thinking process, and her vision also improved significantly within few hours. Telemetry neurologic consult was done by ER, suggested to admit to hospital for workup. Patient denied similar symptoms in the past. She was admitted to the hospital, had Echo, unremarkable, CT angiogram of neck and head, unremarkable, brain MRI, normal. She was seen by neurologist  who felt it probably was complicated migraine, but, since TIA could not have been completely rule out, aspirin and Lipitor therapy was recommended.  Discussion by problem: 1. Complicated migraine vs TIA, aspirin and Lipitor, resolved, no abnormalities on brain MRI, CTA of head and neck, CT head, Echo, follow up with PCP as outpagtient for risk modification 2. DM, Hgb a1c 9.8, poorly controled, advancing insulin , follow hGb a1c in 3 months, may benefit from lifestyle intervention 3. Complicated migraine, asa, neurology follow up as  outpatient, if needed 4 Malignant essential HTN, lisinopril, follow BP colsely and advance as     VITAL SIGNS:  Blood pressure 130/57, pulse 75, temperature 98.2 F (36.8 C), temperature source Oral, resp. rate 18, height 5'  2" (1.575 m), weight 88.905 kg (196 lb), SpO2 96 %.  I/O:  No intake or output data in the 24 hours ending 05/28/15 1001  PHYSICAL EXAMINATION:  GENERAL:  69 y.o.-year-old patient lying in the bed with no acute distress.  EYES: Pupils equal, round, reactive to light and accommodation. No scleral icterus. Extraocular muscles intact.  HEENT: Head atraumatic, normocephalic. Oropharynx and nasopharynx clear.  NECK:  Supple, no jugular venous distention. No thyroid enlargement, no tenderness.  LUNGS: Normal breath sounds bilaterally, no wheezing, rales,rhonchi or crepitation. No use of accessory muscles of respiration.  CARDIOVASCULAR: S1, S2 normal. No murmurs, rubs, or gallops.  ABDOMEN: Soft, non-tender, non-distended. Bowel sounds present. No organomegaly or mass.  EXTREMITIES: No pedal edema, cyanosis, or clubbing.  NEUROLOGIC: Cranial nerves II through XII are intact. Muscle strength 5/5 in all extremities. Sensation intact. Gait not checked.  PSYCHIATRIC: The patient is alert and oriented x 3.  SKIN: No obvious rash, lesion, or ulcer.   DATA REVIEW:   CBC  Recent Labs Lab 05/25/15 0530  WBC 6.4  HGB 10.5*  HCT 31.8*  PLT 268    Chemistries   Recent Labs Lab 05/24/15 1020 05/25/15 0530  NA 139 141  K 3.6 4.2  CL 105 106  CO2 25 29  GLUCOSE 134* 178*  BUN 14 14  CREATININE 0.56 0.75  CALCIUM 9.4 8.7*  AST 16  --   ALT 15  --   ALKPHOS 77  --   BILITOT 0.6  --     Cardiac Enzymes  Recent Labs Lab 05/24/15 1020  TROPONINI <0.03    Microbiology Results  Results for orders placed or performed in visit on 07/26/13  Urine culture     Status: None   Collection Time: 07/26/13  1:00 PM  Result Value Ref Range Status   Micro Text Report   Final       SOURCE: CLEAN CATCH    COMMENT                   MIXED BACTERIAL ORGANISMS   COMMENT                   RESULTS SUGGESTIVE OF CONTAMINATION   ANTIBIOTIC                                                         RADIOLOGY:  No results found.  EKG:   Orders placed or performed during the hospital encounter of 05/24/15  . ED EKG  . ED EKG  . EKG 12-Lead  .  EKG 12-Lead      Management plans discussed with the patient, family and they are in agreement.  CODE STATUS:  Advance Directive Documentation        Most Recent Value   Type of Advance Directive  Healthcare Power of Attorney, Living will   Pre-existing out of facility DNR order (yellow form or pink MOST form)     "MOST" Form in Place?        TOTAL TIME TAKING CARE OF THIS PATIENT: 40 minutes.    Theodoro Grist M.D on 05/28/2015 at 10:01 AM  Between 7am to 6pm - Pager - [phone]  After 6pm go to [email or web address] - password [email or web address] [email or web address]  Office  [phone]  CC: Primary care physician; Loistine Chance, MD

## 2015-06-20 ENCOUNTER — Ambulatory Visit (INDEPENDENT_AMBULATORY_CARE_PROVIDER_SITE_OTHER): Payer: Commercial Managed Care - HMO | Admitting: Family Medicine

## 2015-06-20 ENCOUNTER — Encounter: Payer: Self-pay | Admitting: Family Medicine

## 2015-06-20 VITALS — BP 138/74 | HR 125 | Temp 98.0°F | Resp 18 | Ht 62.0 in | Wt 193.4 lb

## 2015-06-20 DIAGNOSIS — E1121 Type 2 diabetes mellitus with diabetic nephropathy: Secondary | ICD-10-CM | POA: Diagnosis not present

## 2015-06-20 DIAGNOSIS — Z8673 Personal history of transient ischemic attack (TIA), and cerebral infarction without residual deficits: Secondary | ICD-10-CM | POA: Diagnosis not present

## 2015-06-20 DIAGNOSIS — G43109 Migraine with aura, not intractable, without status migrainosus: Secondary | ICD-10-CM

## 2015-06-20 DIAGNOSIS — N760 Acute vaginitis: Secondary | ICD-10-CM | POA: Diagnosis not present

## 2015-06-20 DIAGNOSIS — G43909 Migraine, unspecified, not intractable, without status migrainosus: Secondary | ICD-10-CM | POA: Diagnosis not present

## 2015-06-20 MED ORDER — FLUCONAZOLE 150 MG PO TABS: 150.0000 mg | ORAL_TABLET | ORAL | Status: DC

## 2015-06-20 NOTE — Progress Notes (Signed)
Name: Christine Roberts   MRN: [id number]    DOB: [birth year]   Date:06/20/2015       Progress Note  Subjective  Chief Complaint  Chief Complaint  Patient presents with  . Hospitalization Follow-up    went to ER on 11/16 with syptoms of slurred speech and vision problems.  Was diagnosed with TIA/ migraine CT head normal    HPI  TIA versus Complicated migraine: she developed dizziness, headache and aphasia  on 11/16 - she was transported by EMS to [facility], she had normal Echo, CT, MRI of brain and carotid dopplers.  She was seen by neurologist and advised to continue on aspirin and lipitor in case it was a TIA, but likely a complicated migraine - patient has a remote history of migraines  DMII: she was doing well while on Victoza , but because of insurance coverage she stopped about one year ago. Currently on Metformin, Actos and Levemir but hgbA1C while at the hospital was hig at 8.8%. She is not sure if she follows a diabetic diet.  HTN - bp is back to normal, it was elevated while at [facility]  Yeast vaginitis: she took some antibiotics for un URI recently - she had antibiotics at home and has developed some vaginal itching and discharge. Explained that she should not take antibiotics without seeing a physician first.     Patient Active Problem List   Diagnosis Date Noted  . Headache 05/25/2015  . Malignant essential hypertension 05/25/2015  . Diabetes mellitus ([place]) 05/25/2015  . TIA (transient ischemic attack) 05/24/2015  . Senile purpura ([place]) 05/09/2015  . Benign essential HTN 03/18/2015  . Pain in shoulder 03/18/2015  . Dyslipidemia 03/18/2015  . History of surgery to major organs, presenting hazards to health 03/18/2015  . Hearing loss 03/18/2015  . Hiatal hernia 03/18/2015  . History of colon polyps 03/18/2015  . Calculus of kidney 03/18/2015  . B12 deficiency 03/18/2015  . Microalbuminuria 03/18/2015  . Benign melanoma 03/18/2015  . Adult BMI 30+ 03/18/2015  . Arthritis,  degenerative 03/18/2015  . Diabetes mellitus with nephropathy ([place]) 03/18/2015    Past Surgical History  Procedure Laterality Date  . Lithotripsy  12/2007  . Myringotomy Right 11/2009  . Cystoscopic      Family History  Problem Relation Age of Onset  . Cancer Mother   . Diabetes Mother   . CAD Father     Social History   Social History  . Marital Status: Single    Spouse Name: N/A  . Number of Children: N/A  . Years of Education: N/A   Occupational History  . Not on file.   Social History Main Topics  . Smoking status: Never Smoker   . Smokeless tobacco: Never Used  . Alcohol Use: No  . Drug Use: No  . Sexual Activity: Not Currently   Other Topics Concern  . Not on file   Social History Narrative     Current outpatient prescriptions:  .  aspirin EC 81 MG tablet, Take 1 tablet (81 mg total) by mouth daily., Disp: 90 tablet, Rfl: 0 .  atorvastatin (LIPITOR) 40 MG tablet, Take 40 mg by mouth at bedtime. On Wednesday and Saturday, Disp: , Rfl:  .  calcium citrate (CALCITRATE - DOSED IN MG ELEMENTAL CALCIUM) 950 MG tablet, Take 200 mg of elemental calcium by mouth daily., Disp: , Rfl:  .  fluconazole (DIFLUCAN) 150 MG tablet, Take 1 tablet (150 mg total) by mouth every other day., Disp:  3 tablet, Rfl: 0 .  glucose blood (FREESTYLE LITE) test strip, Use as instructed, Disp: 100 each, Rfl: 12 .  insulin detemir (LEVEMIR) 100 UNIT/ML injection, Inject 0.3 mLs (30 Units total) into the skin daily., Disp: 10 mL, Rfl: 6 .  Lancets (FREESTYLE) lancets, Use as instructed, Disp: 100 each, Rfl: 12 .  lisinopril (PRINIVIL,ZESTRIL) 40 MG tablet, Take 1 tablet (40 mg total) by mouth daily., Disp: 90 tablet, Rfl: 3 .  meloxicam (MOBIC) 15 MG tablet, Take 1 tablet (15 mg total) by mouth daily., Disp: 30 tablet, Rfl: 0 .  metFORMIN (GLUCOPHAGE) 1000 MG tablet, Take 1 tablet (1,000 mg total) by mouth 2 (two) times daily., Disp: 180 tablet, Rfl: 1 .  pioglitazone (ACTOS) 15 MG tablet,  Take 1 tablet (15 mg total) by mouth daily., Disp: 90 tablet, Rfl: 3  Allergies  Allergen Reactions  . Latex Rash  . Erythromycin      ROS  Constitutional: Negative for fever or weight change.  Respiratory: Negative for cough and shortness of breath.   Cardiovascular: Negative for chest pain or palpitations.  Gastrointestinal: Negative for abdominal pain, no bowel changes.  Musculoskeletal: Negative for gait problem or joint swelling.  Skin: Negative for rash.  Neurological: Negative for dizziness or headache.  No other specific complaints in a complete review of systems (except as listed in HPI above).  Objective  Filed Vitals:   06/20/15 1606  BP: 138/74  Pulse: 125  Temp: 98 F (36.7 C)  TempSrc: Oral  Resp: 18  Height: _0  (1.575 m)  Weight: 193 lb 6.4 oz (87.726 kg)  SpO2: 98%    Body mass index is 35.36 kg/(m^2).  Physical Exam  Constitutional: Patient appears well-developed and well-nourished. Obese No distress.  HEENT: head atraumatic, normocephalic, pupils equal and reactive to light, neck supple, throat within normal limits Cardiovascular: Normal rate, regular rhythm and normal heart sounds.  No murmur heard. No BLE edema. Pulmonary/Chest: Effort normal and breath sounds normal. No respiratory distress. Abdominal: Soft.  There is no tenderness. Neuro: no focal findings.  Psychiatric: Patient has a normal mood and affect. behavior is normal. Judgment and thought content normal.  Recent Results (from the past 2160 hour(s))  Lipid panel     Status: None   Collection Time: 04/04/15  8:10 AM  Result Value Ref Range   Cholesterol, Total 190 100 - 199 mg/dL   Triglycerides 142 0 - 149 mg/dL   HDL 65 >39 mg/dL    Comment: According to ATP-III Guidelines, HDL-C >59 mg/dL is considered a negative risk factor for CHD.    VLDL Cholesterol Cal 28 5 - 40 mg/dL   LDL Calculated 97 0 - 99 mg/dL   Chol/HDL Ratio 2.9 0.0 - 4.4 ratio units    Comment:                                    T. Chol/HDL Ratio                                             Men  Women                               1/2 Avg.Risk  3.4  3.3                                   Avg.Risk  5.0    4.4                                2X Avg.Risk  9.6    7.1                                3X Avg.Risk 23.4   11.0   Comprehensive metabolic panel     Status: Abnormal   Collection Time: 04/04/15  8:10 AM  Result Value Ref Range   Glucose 349 (H) 65 - 99 mg/dL   BUN 15 8 - 27 mg/dL   Creatinine, Ser 0.83 0.57 - 1.00 mg/dL   GFR calc non Af Amer 72 >59 mL/min/1.73   GFR calc Af Amer 83 >59 mL/min/1.73   BUN/Creatinine Ratio 18 11 - 26   Sodium 135 134 - 144 mmol/L   Potassium 4.7 3.5 - 5.2 mmol/L   Chloride 95 (L) 97 - 108 mmol/L   CO2 21 18 - 29 mmol/L   Calcium 9.6 8.7 - 10.3 mg/dL   Total Protein 6.7 6.0 - 8.5 g/dL   Albumin 4.1 3.5 - 4.8 g/dL   Globulin, Total 2.6 1.5 - 4.5 g/dL   Albumin/Globulin Ratio 1.6 1.1 - 2.5   Bilirubin Total 0.4 0.0 - 1.2 mg/dL   Alkaline Phosphatase 82 39 - 117 IU/L   AST 8 0 - 40 IU/L   ALT 13 0 - 32 IU/L  CBC with Differential/Platelet     Status: None   Collection Time: 04/04/15  8:10 AM  Result Value Ref Range   WBC 7.6 3.4 - 10.8 x10E3/uL   RBC 4.25 3.77 - 5.28 x10E6/uL   Hemoglobin 11.6 11.1 - 15.9 g/dL   Hematocrit 36.0 34.0 - 46.6 %   MCV 85 79 - 97 fL   MCH 27.3 26.6 - 33.0 pg   MCHC 32.2 31.5 - 35.7 g/dL   RDW 14.1 12.3 - 15.4 %   Platelets 334 150 - 379 x10E3/uL   Neutrophils 61 %   Lymphs 28 %   Monocytes 6 %   Eos 5 %   Basos 0 %   Neutrophils Absolute 4.6 1.4 - 7.0 x10E3/uL   Lymphocytes Absolute 2.1 0.7 - 3.1 x10E3/uL   Monocytes Absolute 0.5 0.1 - 0.9 x10E3/uL   EOS (ABSOLUTE) 0.4 0.0 - 0.4 x10E3/uL   Basophils Absolute 0.0 0.0 - 0.2 x10E3/uL   Immature Granulocytes 0 %   Immature Grans (Abs) 0.0 0.0 - 0.1 x10E3/uL  Vitamin B12     Status: None   Collection Time: 04/04/15  8:10 AM  Result Value Ref Range   Vitamin B-12  718 211 - 946 pg/mL  Vit D  25 hydroxy (rtn osteoporosis monitoring)     Status: None   Collection Time: 04/04/15  8:10 AM  Result Value Ref Range   Vit D, 25-Hydroxy 34.4 30.0 - 100.0 ng/mL    Comment: Vitamin D deficiency has been defined by the Institute of Medicine and an Endocrine Society practice guideline as a level of serum 25-OH vitamin D less than 20 ng/mL (1,2). The Endocrine Society went on to further define vitamin D insufficiency  as a level between 21 and 29 ng/mL (2). 1. IOM (Institute of Medicine). 2010. Dietary reference    intakes for calcium and D. [place]: The    [employer]. 2. Holick MF, [employer], Bischoff-Ferrari HA, et al.    Evaluation, treatment, and prevention of vitamin D    deficiency: an Endocrine Society clinical practice    guideline. JCEM. 2011 Jul; 96(7):1911-30.   TSH     Status: None   Collection Time: 04/04/15  8:10 AM  Result Value Ref Range   TSH 1.290 0.450 - 4.500 uIU/mL  C-reactive protein     Status: None   Collection Time: 04/04/15  8:10 AM  Result Value Ref Range   CRP 4.6 0.0 - 4.9 mg/L  Ethanol     Status: None   Collection Time: 05/24/15 10:20 AM  Result Value Ref Range   Alcohol, Ethyl (B) <5 <5 mg/dL    Comment:        LOWEST DETECTABLE LIMIT FOR SERUM ALCOHOL IS 5 mg/dL FOR MEDICAL PURPOSES ONLY   Protime-INR     Status: None   Collection Time: 05/24/15 10:20 AM  Result Value Ref Range   Prothrombin Time 13.0 11.4 - 15.0 seconds   INR 0.96   APTT     Status: None   Collection Time: 05/24/15 10:20 AM  Result Value Ref Range   aPTT 27 24 - 36 seconds  CBC     Status: Abnormal   Collection Time: 05/24/15 10:20 AM  Result Value Ref Range   WBC 8.9 3.6 - 11.0 K/uL   RBC 4.38 3.80 - 5.20 MIL/uL   Hemoglobin 11.8 (L) 12.0 - 16.0 g/dL   HCT 36.5 35.0 - 47.0 %   MCV 83.3 80.0 - 100.0 fL   MCH 27.0 26.0 - 34.0 pg   MCHC 32.4 32.0 - 36.0 g/dL   RDW 14.3 11.5 - 14.5 %   Platelets 300 150 - 440 K/uL   Differential     Status: None   Collection Time: 05/24/15 10:20 AM  Result Value Ref Range   Neutrophils Relative % 67 %   Neutro Abs 6.0 1.4 - 6.5 K/uL   Lymphocytes Relative 23 %   Lymphs Abs 2.1 1.0 - 3.6 K/uL   Monocytes Relative 6 %   Monocytes Absolute 0.5 0.2 - 0.9 K/uL   Eosinophils Relative 3 %   Eosinophils Absolute 0.3 0 - 0.7 K/uL   Basophils Relative 1 %   Basophils Absolute 0.1 0 - 0.1 K/uL  Comprehensive metabolic panel     Status: Abnormal   Collection Time: 05/24/15 10:20 AM  Result Value Ref Range   Sodium 139 135 - 145 mmol/L   Potassium 3.6 3.5 - 5.1 mmol/L   Chloride 105 101 - 111 mmol/L   CO2 25 22 - 32 mmol/L   Glucose, Bld 134 (H) 65 - 99 mg/dL   BUN 14 6 - 20 mg/dL   Creatinine, Ser 0.56 0.44 - 1.00 mg/dL   Calcium 9.4 8.9 - 10.3 mg/dL   Total Protein 7.4 6.5 - 8.1 g/dL   Albumin 3.9 3.5 - 5.0 g/dL   AST 16 15 - 41 U/L   ALT 15 14 - 54 U/L   Alkaline Phosphatase 77 38 - 126 U/L   Total Bilirubin 0.6 0.3 - 1.2 mg/dL   GFR calc non Af Amer >60 >60 mL/min   GFR calc Af Amer >60 >60 mL/min    Comment: (NOTE) The  eGFR has been calculated using the CKD EPI equation. This calculation has not been validated in all clinical situations. eGFR's persistently <60 mL/min signify possible Chronic Kidney Disease.    Anion gap 9 5 - 15  Troponin I     Status: None   Collection Time: 05/24/15 10:20 AM  Result Value Ref Range   Troponin I <0.03 <0.031 ng/mL    Comment:        NO INDICATION OF MYOCARDIAL INJURY.   Hemoglobin A1c     Status: Abnormal   Collection Time: 05/24/15 10:20 AM  Result Value Ref Range   Hgb A1c MFr Bld 9.8 (H) 4.0 - 6.0 %  Lipid panel     Status: None   Collection Time: 05/24/15 10:20 AM  Result Value Ref Range   Cholesterol 183 0 - 200 mg/dL   Triglycerides 116 <150 mg/dL   HDL 66 >40 mg/dL   Total CHOL/HDL Ratio 2.8 RATIO   VLDL 23 0 - 40 mg/dL   LDL Cholesterol 94 0 - 99 mg/dL    Comment:        Total Cholesterol/HDL:CHD  Risk Coronary Heart Disease Risk Table                     Men   Women  1/2 Average Risk   3.4   3.3  Average Risk       5.0   4.4  2 X Average Risk   9.6   7.1  3 X Average Risk  23.4   11.0        Use the calculated Patient Ratio above and the CHD Risk Table to determine the patient's CHD Risk.        ATP III CLASSIFICATION (LDL):  <100     mg/dL   Optimal  100-129  mg/dL   Near or Above                    Optimal  130-159  mg/dL   Borderline  160-189  mg/dL   High  >190     mg/dL   Very High   Urinalysis complete, with microscopic ([facility] only)     Status: Abnormal   Collection Time: 05/24/15 10:44 AM  Result Value Ref Range   Color, Urine STRAW (A) YELLOW   APPearance CLEAR (A) CLEAR   Glucose, UA NEGATIVE NEGATIVE mg/dL   Bilirubin Urine NEGATIVE NEGATIVE   Ketones, ur NEGATIVE NEGATIVE mg/dL   Specific Gravity, Urine 1.008 1.005 - 1.030   Hgb urine dipstick NEGATIVE NEGATIVE   pH 5.0 5.0 - 8.0   Protein, ur NEGATIVE NEGATIVE mg/dL   Nitrite NEGATIVE NEGATIVE   Leukocytes, UA NEGATIVE NEGATIVE   RBC / HPF NONE SEEN 0 - 5 RBC/hpf   WBC, UA 0-5 0 - 5 WBC/hpf   Bacteria, UA NONE SEEN NONE SEEN   Squamous Epithelial / LPF 0-5 (A) NONE SEEN   Mucous PRESENT   Glucose, capillary     Status: None   Collection Time: 05/24/15  9:23 PM  Result Value Ref Range   Glucose-Capillary 99 65 - 99 mg/dL   Comment 1 Notify RN   Basic metabolic panel     Status: Abnormal   Collection Time: 05/25/15  5:30 AM  Result Value Ref Range   Sodium 141 135 - 145 mmol/L   Potassium 4.2 3.5 - 5.1 mmol/L   Chloride 106 101 - 111 mmol/L   CO2 29 22 -  32 mmol/L   Glucose, Bld 178 (H) 65 - 99 mg/dL   BUN 14 6 - 20 mg/dL   Creatinine, Ser 0.75 0.44 - 1.00 mg/dL   Calcium 8.7 (L) 8.9 - 10.3 mg/dL   GFR calc non Af Amer >60 >60 mL/min   GFR calc Af Amer >60 >60 mL/min    Comment: (NOTE) The eGFR has been calculated using the CKD EPI equation. This calculation has not been validated in all  clinical situations. eGFR's persistently <60 mL/min signify possible Chronic Kidney Disease.    Anion gap 6 5 - 15  CBC     Status: Abnormal   Collection Time: 05/25/15  5:30 AM  Result Value Ref Range   WBC 6.4 3.6 - 11.0 K/uL   RBC 3.79 (L) 3.80 - 5.20 MIL/uL   Hemoglobin 10.5 (L) 12.0 - 16.0 g/dL   HCT 31.8 (L) 35.0 - 47.0 %   MCV 83.9 80.0 - 100.0 fL   MCH 27.7 26.0 - 34.0 pg   MCHC 33.0 32.0 - 36.0 g/dL   RDW 14.4 11.5 - 14.5 %   Platelets 268 150 - 440 K/uL  Glucose, capillary     Status: Abnormal   Collection Time: 05/25/15  7:42 AM  Result Value Ref Range   Glucose-Capillary 158 (H) 65 - 99 mg/dL  Glucose, capillary     Status: Abnormal   Collection Time: 05/25/15 11:17 AM  Result Value Ref Range   Glucose-Capillary 178 (H) 65 - 99 mg/dL     PHQ2/9: Depression screen [facility] 2/9 06/20/2015 03/21/2015  Decreased Interest 0 0  Down, Depressed, Hopeless 0 0  PHQ - 2 Score 0 0    Fall Risk: Fall Risk  06/20/2015 03/21/2015  Falls in the past year? Yes No  Number falls in past yr: 1 -  Injury with Fall? No -    Functional Status Survey: Is the patient deaf or have difficulty hearing?: No Does the patient have difficulty seeing, even when wearing glasses/contacts?: Yes (reading glasses) Does the patient have difficulty concentrating, remembering, or making decisions?: No Does the patient have difficulty walking or climbing stairs?: No Does the patient have difficulty dressing or bathing?: No Does the patient have difficulty doing errands alone such as visiting a doctor's office or shopping?: No    Assessment & Plan  1. Hx-TIA (transient ischemic attack)  No symptoms since last visit, continue aspirin and lipitor - only a few times weekly because of leg cramps  2. Complicated migraine  Seen by neurologist - no episodes of headaches of dizziness since hospitalization  3. Diabetic nephropathy associated with type 2 diabetes mellitus ([place])  Refer to diabetic  teaching class, bring a sugar log on her next visit, and continue Levemir 15 units in am and 5 units at night, she tried going up to 30 units as recommended by [facility] physician but caused hypoglycemia  4. Vaginitis  - fluconazole (DIFLUCAN) 150 MG tablet; Take 1 tablet (150 mg total) by mouth every other day.  Dispense: 3 tablet; Refill: 0

## 2015-06-24 ENCOUNTER — Other Ambulatory Visit: Payer: Self-pay | Admitting: Family Medicine

## 2015-07-07 ENCOUNTER — Encounter: Payer: Commercial Managed Care - HMO | Attending: Family Medicine | Admitting: *Deleted

## 2015-07-07 ENCOUNTER — Encounter: Payer: Self-pay | Admitting: *Deleted

## 2015-07-07 VITALS — BP 110/70 | Ht 63.0 in | Wt 187.8 lb

## 2015-07-07 DIAGNOSIS — E119 Type 2 diabetes mellitus without complications: Secondary | ICD-10-CM

## 2015-07-07 DIAGNOSIS — E669 Obesity, unspecified: Secondary | ICD-10-CM | POA: Insufficient documentation

## 2015-07-07 DIAGNOSIS — Z794 Long term (current) use of insulin: Secondary | ICD-10-CM

## 2015-07-07 NOTE — Progress Notes (Signed)
Diabetes Self-Management Education  Visit Type: First/Initial  Appt. Start Time: 1325  Appt. End Time: 1430  07/07/2015  Christine Roberts, identified by name and date of birth, is a 69 y.o. female with a diagnosis of Diabetes: Type 2.   ASSESSMENT  Blood pressure 110/70, height 5\' 3"  (1.6 m), weight 187 lb 12.8 oz (85.186 kg). Body mass index is 33.28 kg/(m^2).      Diabetes Self-Management Education - 07/07/15 1454    Visit Information   Visit Type First/Initial   Initial Visit   Diabetes Type Type 2   Are you currently following a meal plan? Yes   What type of meal plan do you follow? counting calories and not eating many sweets; using My Fitness Pal app on her phone   Are you taking your medications as prescribed? Yes   Date Diagnosed 10 years ago   Health Coping   How would you rate your overall health? Good   Psychosocial Assessment   Patient Belief/Attitude about Diabetes Other (comment)  "I accept it". Pt reports she knows what to do but doesn't do it all the time.    Self-care barriers None   Self-management support Doctor's office   Patient Concerns Nutrition/Meal planning;Medication;Monitoring;Healthy Lifestyle;Weight Control;Glycemic Control   Special Needs None   Preferred Learning Style Auditory;Visual;Hands on   [place] in progress   How often do you need to have someone help you when you read instructions, pamphlets, or other written materials from your doctor or pharmacy? 1 - Never   What is the last grade level you completed in school? Associates degree   Complications   Last HgB A1C per patient/outside source 9.8 %  05/24/15   How often do you check your blood sugar? 1-2 times/day   Fasting Blood glucose range (mg/dL) 70-129;130-179  FBG's 91-175 mg/dL with 1 reading of 203 mg/dL but she didn't take evening insulin   Postprandial Blood glucose range (mg/dL) >200;180-200;70-129;130-179  pp's 110-399 mg/dL   Have you had a dilated eye  exam in the past 12 months? Yes   Have you had a dental exam in the past 12 months? Yes   Are you checking your feet? No   Dietary Intake   Breakfast Kashi cereal and milk; apple sauce; egg; yogurt; pork and gravy biscuit   Snack (morning) fruit, nuts   Lunch sandwich or wrap   Snack (afternoon) nuts, popcorn, cake   Dinner meat and sweet potato; salad; fried fish with green beans; rice and beans; [place] foods   Beverage(s) coffee, diet soda, occasional sugar sweetened tea and soda   Exercise   Exercise Type ADL's   Patient Education   Previous Diabetes Education No   Disease state  Definition of diabetes, type 1 and 2, and the diagnosis of diabetes;Explored patient's options for treatment of their diabetes   Nutrition management  Role of diet in the treatment of diabetes and the relationship between the three main macronutrients and blood glucose level;Carbohydrate counting   Physical activity and exercise  Role of exercise on diabetes management, blood pressure control and cardiac health.   Medications Reviewed patients medication for diabetes, action, purpose, timing of dose and side effects.;Taught/reviewed insulin injection, site rotation, insulin storage and needle disposal.   Monitoring Identified appropriate SMBG and/or A1C goals.   Acute complications Taught treatment of hypoglycemia - the 15 rule.   Chronic complications Relationship between chronic complications and blood glucose control   Psychosocial adjustment Role of stress on diabetes;Identified  and addressed patients feelings and concerns about diabetes   Individualized Goals (developed by patient)   Reducing Risk Improve blood sugars Decrease medications Prevent diabetes complications Lose weight Lead a healthier lifestyle   Outcomes   Expected Outcomes Demonstrated interest in learning. Expect positive outcomes      Individualized Plan for Diabetes Self-Management Training:   Learning Objective:  Patient will  have a greater understanding of diabetes self-management. Patient education plan is to attend individual and/or group sessions per assessed needs and concerns.   Plan:   Patient Instructions  Check blood sugars 2 x day before breakfast and 2 hrs after supper every day Exercise: Begin walking  for  15  minutes 3 days a week and gradually increase to 30 minutes 5 x week Eat 3 meals day,  1-2 snacks a day Space meals 4-6 hours apart Avoid sugar sweetened drinks (soda, tea) Limit desserts/sweets Bring blood sugar records to the next medical appointment  Carry fast acting glucose and a snack at all times Check with insurance and call for follow up appointment or classes   Expected Outcomes:  Demonstrated interest in learning. Expect positive outcomes  Education material provided:  General Meal Planning Guidelines Simple Meal Plan Glucose tablets Symptoms, causes and treatments of Hypoglycemia  If problems or questions, patient to contact team via:   Johny Drilling, [place], [place], CDE [phone]  Future DSME appointment:  Pt to check with insurance regarding coverage. She will call back to schedule classes or 1:1 appointment with dietitian.

## 2015-07-07 NOTE — Patient Instructions (Addendum)
Check blood sugars 2 x day before breakfast and 2 hrs after supper every day Exercise: Begin walking  for  15  minutes 3 days a week and gradually increase to 30 minutes 5 x week Eat 3 meals day,  1-2 snacks a day Space meals 4-6 hours apart Avoid sugar sweetened drinks (soda, tea) Limit desserts/sweets Bring blood sugar records to the next medical appointment  Carry fast acting glucose and a snack at all times Check with insurance and call for follow up appointment or classes

## 2015-07-11 ENCOUNTER — Ambulatory Visit: Payer: Commercial Managed Care - HMO | Admitting: Family Medicine

## 2015-07-14 ENCOUNTER — Ambulatory Visit (INDEPENDENT_AMBULATORY_CARE_PROVIDER_SITE_OTHER): Payer: Commercial Managed Care - HMO | Admitting: Family Medicine

## 2015-07-14 ENCOUNTER — Encounter: Payer: Self-pay | Admitting: Family Medicine

## 2015-07-14 VITALS — BP 116/66 | HR 108 | Temp 97.8°F | Resp 18 | Ht 63.0 in | Wt 186.0 lb

## 2015-07-14 DIAGNOSIS — J069 Acute upper respiratory infection, unspecified: Secondary | ICD-10-CM

## 2015-07-14 DIAGNOSIS — E1165 Type 2 diabetes mellitus with hyperglycemia: Secondary | ICD-10-CM | POA: Diagnosis not present

## 2015-07-14 DIAGNOSIS — IMO0002 Reserved for concepts with insufficient information to code with codable children: Secondary | ICD-10-CM

## 2015-07-14 DIAGNOSIS — E1121 Type 2 diabetes mellitus with diabetic nephropathy: Secondary | ICD-10-CM

## 2015-07-14 MED ORDER — ATORVASTATIN CALCIUM 40 MG PO TABS: 40.0000 mg | ORAL_TABLET | Freq: Every day | ORAL | Status: DC

## 2015-07-14 MED ORDER — GLUCOSE BLOOD VI STRP: ORAL_STRIP | Status: DC

## 2015-07-14 NOTE — Progress Notes (Signed)
Name: Christine Roberts   MRN: [id number]    DOB: 10-19-1945   Date:07/14/2015       Progress Note  Subjective  Chief Complaint  Chief Complaint  Patient presents with  . Medication Management    2 month F/U-Needs refill on glucose strips  . Diabetes    Checks twice daily, Low-91 Average-140-160's High-399, doing Levemir 15 units in the am and 10 units at night. Just got Victoza about 2 weeks ago and sugar is improving.    HPI  DMII: she is here today for a 2 month follow up, her last hgbA1C was 9.8% and she was advised to check fsbs at home fasting and pos-prandially. She is using Levemir 15 units in am and 10 qpm and is back on Victoza ( past couple of weeks ) Still taking metformin and Actos. She still splurges on carbs at times , but is following a diabetic diet most of the time. She denies polyphagia, polyuria or polydipsia. She is on ARB. Her glucose log was reviewed and it is improving greatly over the past 10 days. Advised to continue current medications and continue diabetic diet  URI: she states that for the past few days she has noticed nasal congestion, rhinorrhea, no headaches, fever, chills or sore throat.   Patient Active Problem List   Diagnosis Date Noted  . Headache 05/25/2015  . Malignant essential hypertension 05/25/2015  . Diabetes mellitus ([place]) 05/25/2015  . TIA (transient ischemic attack) 05/24/2015  . Senile purpura ([place]) 05/09/2015  . Benign essential HTN 03/18/2015  . Pain in shoulder 03/18/2015  . Dyslipidemia 03/18/2015  . History of surgery to major organs, presenting hazards to health 03/18/2015  . Hearing loss 03/18/2015  . Hiatal hernia 03/18/2015  . History of colon polyps 03/18/2015  . Calculus of kidney 03/18/2015  . B12 deficiency 03/18/2015  . Microalbuminuria 03/18/2015  . Benign melanoma 03/18/2015  . Adult BMI 30+ 03/18/2015  . Arthritis, degenerative 03/18/2015  . Diabetes mellitus with nephropathy ([place]) 03/18/2015    Past Surgical History   Procedure Laterality Date  . Lithotripsy  12/2007  . Myringotomy Right 11/2009  . Cystoscopic    . Cholecystectomy    . Abdominal hysterectomy      Family History  Problem Relation Age of Onset  . Cancer Mother   . Diabetes Mother   . CAD Father   . Diabetes Daughter     Social History   Social History  . Marital Status: Single    Spouse Name: N/A  . Number of Children: N/A  . Years of Education: N/A   Occupational History  . Not on file.   Social History Main Topics  . Smoking status: Never Smoker   . Smokeless tobacco: Never Used  . Alcohol Use: 0.6 oz/week    1 Shots of liquor per week  . Drug Use: No  . Sexual Activity: Not Currently   Other Topics Concern  . Not on file   Social History Narrative     Current outpatient prescriptions:  .  aspirin EC 81 MG tablet, Take 1 tablet (81 mg total) by mouth daily., Disp: 90 tablet, Rfl: 0 .  atorvastatin (LIPITOR) 40 MG tablet, Take 40 mg by mouth at bedtime. On Wednesday and Saturday, Disp: , Rfl:  .  calcium citrate (CALCITRATE - DOSED IN MG ELEMENTAL CALCIUM) 950 MG tablet, Take 200 mg of elemental calcium by mouth daily., Disp: , Rfl:  .  glucose blood (FREESTYLE LITE) test strip,  Use as instructed, Disp: 100 each, Rfl: 12 .  insulin detemir (LEVEMIR) 100 UNIT/ML injection, Inject 0.3 mLs (30 Units total) into the skin daily., Disp: 10 mL, Rfl: 6 .  Lancets (FREESTYLE) lancets, Use as instructed, Disp: 100 each, Rfl: 12 .  lisinopril (PRINIVIL,ZESTRIL) 40 MG tablet, Take 1 tablet (40 mg total) by mouth daily., Disp: 90 tablet, Rfl: 3 .  meloxicam (MOBIC) 15 MG tablet, Take 1 tablet (15 mg total) by mouth daily., Disp: 30 tablet, Rfl: 0 .  metFORMIN (GLUCOPHAGE) 1000 MG tablet, Take 1 tablet (1,000 mg total) by mouth 2 (two) times daily., Disp: 180 tablet, Rfl: 1 .  pioglitazone (ACTOS) 15 MG tablet, Take 1 tablet (15 mg total) by mouth daily., Disp: 90 tablet, Rfl: 3 .  VICTOZA 18 MG/3ML SOPN, INJECT 1.8MG SQ  DAILY, Disp: 9 mL, Rfl: 2  Allergies  Allergen Reactions  . Latex Rash  . Erythromycin Other (See Comments)    Strawberry tongue     ROS  Ten systems reviewed and is negative except as mentioned in HPI   Objective  Filed Vitals:   07/14/15 1204  BP: 116/66  Pulse: 108  Temp: 97.8 F (36.6 C)  TempSrc: Oral  Resp: 18  Height: 5' 3" (1.6 m)  Weight: 186 lb (84.369 kg)  SpO2: 97%    Body mass index is 32.96 kg/(m^2).  Physical Exam  Constitutional: Patient appears well-developed and well-nourished. Obese  No distress.  HEENT: head atraumatic, normocephalic, pupils equal and reactive to light, neck supple, throat within normal limits Cardiovascular: Normal rate, regular rhythm and normal heart sounds.  No murmur heard. No BLE edema. Pulmonary/Chest: Effort normal and breath sounds normal. No respiratory distress. Abdominal: Soft.  There is no tenderness. Psychiatric: Patient has a normal mood and affect. behavior is normal. Judgment and thought content normal.  Recent Results (from the past 2160 hour(s))  Ethanol     Status: None   Collection Time: 05/24/15 10:20 AM  Result Value Ref Range   Alcohol, Ethyl (B) <5 <5 mg/dL    Comment:        LOWEST DETECTABLE LIMIT FOR SERUM ALCOHOL IS 5 mg/dL FOR MEDICAL PURPOSES ONLY   Protime-INR     Status: None   Collection Time: 05/24/15 10:20 AM  Result Value Ref Range   Prothrombin Time 13.0 11.4 - 15.0 seconds   INR 0.96   APTT     Status: None   Collection Time: 05/24/15 10:20 AM  Result Value Ref Range   aPTT 27 24 - 36 seconds  CBC     Status: Abnormal   Collection Time: 05/24/15 10:20 AM  Result Value Ref Range   WBC 8.9 3.6 - 11.0 K/uL   RBC 4.38 3.80 - 5.20 MIL/uL   Hemoglobin 11.8 (L) 12.0 - 16.0 g/dL   HCT 36.5 35.0 - 47.0 %   MCV 83.3 80.0 - 100.0 fL   MCH 27.0 26.0 - 34.0 pg   MCHC 32.4 32.0 - 36.0 g/dL   RDW 14.3 11.5 - 14.5 %   Platelets 300 150 - 440 K/uL  Differential     Status: None    Collection Time: 05/24/15 10:20 AM  Result Value Ref Range   Neutrophils Relative % 67 %   Neutro Abs 6.0 1.4 - 6.5 K/uL   Lymphocytes Relative 23 %   Lymphs Abs 2.1 1.0 - 3.6 K/uL   Monocytes Relative 6 %   Monocytes Absolute 0.5 0.2 - 0.9 K/uL  Eosinophils Relative 3 %   Eosinophils Absolute 0.3 0 - 0.7 K/uL   Basophils Relative 1 %   Basophils Absolute 0.1 0 - 0.1 K/uL  Comprehensive metabolic panel     Status: Abnormal   Collection Time: 05/24/15 10:20 AM  Result Value Ref Range   Sodium 139 135 - 145 mmol/L   Potassium 3.6 3.5 - 5.1 mmol/L   Chloride 105 101 - 111 mmol/L   CO2 25 22 - 32 mmol/L   Glucose, Bld 134 (H) 65 - 99 mg/dL   BUN 14 6 - 20 mg/dL   Creatinine, Ser 0.56 0.44 - 1.00 mg/dL   Calcium 9.4 8.9 - 10.3 mg/dL   Total Protein 7.4 6.5 - 8.1 g/dL   Albumin 3.9 3.5 - 5.0 g/dL   AST 16 15 - 41 U/L   ALT 15 14 - 54 U/L   Alkaline Phosphatase 77 38 - 126 U/L   Total Bilirubin 0.6 0.3 - 1.2 mg/dL   GFR calc non Af Amer >60 >60 mL/min   GFR calc Af Amer >60 >60 mL/min    Comment: (NOTE) The eGFR has been calculated using the CKD EPI equation. This calculation has not been validated in all clinical situations. eGFR's persistently <60 mL/min signify possible Chronic Kidney Disease.    Anion gap 9 5 - 15  Troponin I     Status: None   Collection Time: 05/24/15 10:20 AM  Result Value Ref Range   Troponin I <0.03 <0.031 ng/mL    Comment:        NO INDICATION OF MYOCARDIAL INJURY.   Hemoglobin A1c     Status: Abnormal   Collection Time: 05/24/15 10:20 AM  Result Value Ref Range   Hgb A1c MFr Bld 9.8 (H) 4.0 - 6.0 %  Lipid panel     Status: None   Collection Time: 05/24/15 10:20 AM  Result Value Ref Range   Cholesterol 183 0 - 200 mg/dL   Triglycerides 116 <150 mg/dL   HDL 66 >40 mg/dL   Total CHOL/HDL Ratio 2.8 RATIO   VLDL 23 0 - 40 mg/dL   LDL Cholesterol 94 0 - 99 mg/dL    Comment:        Total Cholesterol/HDL:CHD Risk Coronary Heart Disease Risk  Table                     Men   Women  1/2 Average Risk   3.4   3.3  Average Risk       5.0   4.4  2 X Average Risk   9.6   7.1  3 X Average Risk  23.4   11.0        Use the calculated Patient Ratio above and the CHD Risk Table to determine the patient's CHD Risk.        ATP III CLASSIFICATION (LDL):  <100     mg/dL   Optimal  100-129  mg/dL   Near or Above                    Optimal  130-159  mg/dL   Borderline  160-189  mg/dL   High  >190     mg/dL   Very High   Urinalysis complete, with microscopic ([facility] only)     Status: Abnormal   Collection Time: 05/24/15 10:44 AM  Result Value Ref Range   Color, Urine STRAW (A) YELLOW   APPearance CLEAR (A) CLEAR  Glucose, UA NEGATIVE NEGATIVE mg/dL   Bilirubin Urine NEGATIVE NEGATIVE   Ketones, ur NEGATIVE NEGATIVE mg/dL   Specific Gravity, Urine 1.008 1.005 - 1.030   Hgb urine dipstick NEGATIVE NEGATIVE   pH 5.0 5.0 - 8.0   Protein, ur NEGATIVE NEGATIVE mg/dL   Nitrite NEGATIVE NEGATIVE   Leukocytes, UA NEGATIVE NEGATIVE   RBC / HPF NONE SEEN 0 - 5 RBC/hpf   WBC, UA 0-5 0 - 5 WBC/hpf   Bacteria, UA NONE SEEN NONE SEEN   Squamous Epithelial / LPF 0-5 (A) NONE SEEN   Mucous PRESENT   Glucose, capillary     Status: None   Collection Time: 05/24/15  9:23 PM  Result Value Ref Range   Glucose-Capillary 99 65 - 99 mg/dL   Comment 1 Notify RN   Basic metabolic panel     Status: Abnormal   Collection Time: 05/25/15  5:30 AM  Result Value Ref Range   Sodium 141 135 - 145 mmol/L   Potassium 4.2 3.5 - 5.1 mmol/L   Chloride 106 101 - 111 mmol/L   CO2 29 22 - 32 mmol/L   Glucose, Bld 178 (H) 65 - 99 mg/dL   BUN 14 6 - 20 mg/dL   Creatinine, Ser 0.75 0.44 - 1.00 mg/dL   Calcium 8.7 (L) 8.9 - 10.3 mg/dL   GFR calc non Af Amer >60 >60 mL/min   GFR calc Af Amer >60 >60 mL/min    Comment: (NOTE) The eGFR has been calculated using the CKD EPI equation. This calculation has not been validated in all clinical situations. eGFR's  persistently <60 mL/min signify possible Chronic Kidney Disease.    Anion gap 6 5 - 15  CBC     Status: Abnormal   Collection Time: 05/25/15  5:30 AM  Result Value Ref Range   WBC 6.4 3.6 - 11.0 K/uL   RBC 3.79 (L) 3.80 - 5.20 MIL/uL   Hemoglobin 10.5 (L) 12.0 - 16.0 g/dL   HCT 31.8 (L) 35.0 - 47.0 %   MCV 83.9 80.0 - 100.0 fL   MCH 27.7 26.0 - 34.0 pg   MCHC 33.0 32.0 - 36.0 g/dL   RDW 14.4 11.5 - 14.5 %   Platelets 268 150 - 440 K/uL  Glucose, capillary     Status: Abnormal   Collection Time: 05/25/15  7:42 AM  Result Value Ref Range   Glucose-Capillary 158 (H) 65 - 99 mg/dL  Glucose, capillary     Status: Abnormal   Collection Time: 05/25/15 11:17 AM  Result Value Ref Range   Glucose-Capillary 178 (H) 65 - 99 mg/dL     PHQ2/9: Depression screen [facility] 2/9 07/07/2015 06/20/2015 03/21/2015  Decreased Interest 0 0 0  Down, Depressed, Hopeless 0 0 0  PHQ - 2 Score 0 0 0     Fall Risk: Fall Risk  07/07/2015 06/20/2015 03/21/2015  Falls in the past year? Yes Yes No  Number falls in past yr: 1 1 -  Injury with Fall? No No -  Risk for fall due to : History of fall(s) - -  Follow up Education provided;Falls prevention discussed - -    Assessment & Plan  1. Uncontrolled type 2 diabetes mellitus with nephropathy ([place])  Continue all medication   2. Upper respiratory infection  Advised saline spray and Coricidin HBP, call back if no improvement

## 2015-07-31 ENCOUNTER — Other Ambulatory Visit: Payer: Self-pay | Admitting: Family Medicine

## 2015-07-31 DIAGNOSIS — E1121 Type 2 diabetes mellitus with diabetic nephropathy: Secondary | ICD-10-CM

## 2015-07-31 DIAGNOSIS — E1165 Type 2 diabetes mellitus with hyperglycemia: Principal | ICD-10-CM

## 2015-07-31 DIAGNOSIS — IMO0002 Reserved for concepts with insufficient information to code with codable children: Secondary | ICD-10-CM

## 2015-07-31 MED ORDER — GLUCOSE BLOOD VI STRP
ORAL_STRIP | Status: DC
Start: 2015-07-31 — End: 2016-09-09

## 2015-07-31 MED ORDER — ACCU-CHEK AVIVA PLUS W/DEVICE KIT: 1.0000 [IU] | PACK | Freq: Two times a day (BID) | Status: DC

## 2015-07-31 MED ORDER — LANCET DEVICES MISC: 1.0000 [IU] | Freq: Two times a day (BID) | Status: DC

## 2015-08-07 ENCOUNTER — Encounter: Payer: Self-pay | Admitting: *Deleted

## 2015-08-08 ENCOUNTER — Ambulatory Visit (INDEPENDENT_AMBULATORY_CARE_PROVIDER_SITE_OTHER): Payer: Commercial Managed Care - HMO | Admitting: Family Medicine

## 2015-08-08 ENCOUNTER — Other Ambulatory Visit: Payer: Self-pay

## 2015-08-08 ENCOUNTER — Encounter: Payer: Self-pay | Admitting: Family Medicine

## 2015-08-08 VITALS — BP 108/58 | HR 107 | Temp 97.9°F | Resp 18 | Ht 63.0 in | Wt 183.6 lb

## 2015-08-08 DIAGNOSIS — E1165 Type 2 diabetes mellitus with hyperglycemia: Secondary | ICD-10-CM

## 2015-08-08 DIAGNOSIS — I10 Essential (primary) hypertension: Secondary | ICD-10-CM

## 2015-08-08 DIAGNOSIS — E1121 Type 2 diabetes mellitus with diabetic nephropathy: Secondary | ICD-10-CM

## 2015-08-08 DIAGNOSIS — H6981 Other specified disorders of Eustachian tube, right ear: Secondary | ICD-10-CM | POA: Diagnosis not present

## 2015-08-08 DIAGNOSIS — IMO0002 Reserved for concepts with insufficient information to code with codable children: Secondary | ICD-10-CM

## 2015-08-08 MED ORDER — MOMETASONE FUROATE 50 MCG/ACT NA SUSP: 2.0000 | Freq: Every day | NASAL | Status: DC

## 2015-08-08 MED ORDER — LISINOPRIL 40 MG PO TABS: 20.0000 mg | ORAL_TABLET | Freq: Every day | ORAL | Status: DC

## 2015-08-08 MED ORDER — FLUTICASONE PROPIONATE 50 MCG/ACT NA SUSP: 2.0000 | Freq: Every day | NASAL | Status: DC

## 2015-08-08 NOTE — Telephone Encounter (Signed)
Medication was changed to another nasal spray that is on this patient's formulary.   Refill request was sent to Dr. Steele Sizer for approval and submission.

## 2015-08-08 NOTE — Progress Notes (Signed)
Name: Christine Roberts   MRN: [id number]    DOB: 1946/05/27   Date:08/08/2015       Progress Note  Subjective  Chief Complaint  Chief Complaint  Patient presents with  . Ear Fullness    Onset- 1 month on right side-unchanged, having ear pressure/pain, headache, dizzy and off balance. Has tried otc Sinuc and Cold Medication and Saline Spray with no relief.     HPI  Eustachian Tube Dysfunction: she had an URI about one month ago, and all symptoms have resolved except for right ear fullness, sometimes a pressure. No nasal congestion, no rhinorrhea.   Dizziness: her bp is low, taking Lisinopril 40 mg qpm and getting dizzy in the am's - lightheaded in am's, no problems in the afternoon. Advised to decrease bp to half pill qpm and monitor bp at home.   DMII: she states she has been eating better, low sugar diet, lost 3 lbs since last visit. Fasting glucose down to 86-105. No hypoglycemic episodes. Discussed weaning down on Levemir dose to keep glucose fasting between 90's-140's  Patient Active Problem List   Diagnosis Date Noted  . Headache 05/25/2015  . Malignant essential hypertension 05/25/2015  . Diabetes mellitus ([place]) 05/25/2015  . TIA (transient ischemic attack) 05/24/2015  . Senile purpura ([place]) 05/09/2015  . Benign essential HTN 03/18/2015  . Pain in shoulder 03/18/2015  . Dyslipidemia 03/18/2015  . History of surgery to major organs, presenting hazards to health 03/18/2015  . Hearing loss 03/18/2015  . Hiatal hernia 03/18/2015  . History of colon polyps 03/18/2015  . Calculus of kidney 03/18/2015  . B12 deficiency 03/18/2015  . Microalbuminuria 03/18/2015  . Benign melanoma 03/18/2015  . Adult BMI 30+ 03/18/2015  . Arthritis, degenerative 03/18/2015  . Diabetes mellitus with nephropathy ([place]) 03/18/2015    Past Surgical History  Procedure Laterality Date  . Lithotripsy  12/2007  . Myringotomy Right 11/2009  . Cystoscopic    . Cholecystectomy    . Abdominal hysterectomy       Family History  Problem Relation Age of Onset  . Cancer Mother   . Diabetes Mother   . CAD Father   . Diabetes Daughter     Social History   Social History  . Marital Status: Single    Spouse Name: N/A  . Number of Children: N/A  . Years of Education: N/A   Occupational History  . Not on file.   Social History Main Topics  . Smoking status: Never Smoker   . Smokeless tobacco: Never Used  . Alcohol Use: 0.6 oz/week    1 Shots of liquor per week  . Drug Use: No  . Sexual Activity: Not Currently   Other Topics Concern  . Not on file   Social History Narrative     Current outpatient prescriptions:  .  aspirin EC 81 MG tablet, Take 1 tablet (81 mg total) by mouth daily., Disp: 90 tablet, Rfl: 0 .  atorvastatin (LIPITOR) 40 MG tablet, Take 1 tablet (40 mg total) by mouth at bedtime. On Wednesday and Saturday, Disp: 90 tablet, Rfl: 3 .  Blood Glucose Monitoring Suppl (ACCU-CHEK AVIVA PLUS) w/Device KIT, 1 Units by Does not apply route 2 (two) times daily., Disp: 1 kit, Rfl: 0 .  calcium citrate (CALCITRATE - DOSED IN MG ELEMENTAL CALCIUM) 950 MG tablet, Take 200 mg of elemental calcium by mouth daily., Disp: , Rfl:  .  glucose blood (ACCU-CHEK AVIVA) test strip, Use as instructed, Disp: 100 each,  Rfl: 12 .  insulin detemir (LEVEMIR) 100 UNIT/ML injection, Inject 0.3 mLs (30 Units total) into the skin daily., Disp: 10 mL, Rfl: 6 .  Lancet Devices MISC, 1 Units by Does not apply route 2 (two) times daily., Disp: 100 each, Rfl: 3 .  lisinopril (PRINIVIL,ZESTRIL) 40 MG tablet, Take 0.5 tablets (20 mg total) by mouth daily., Disp: 90 tablet, Rfl: 3 .  meloxicam (MOBIC) 15 MG tablet, Take 1 tablet (15 mg total) by mouth daily., Disp: 30 tablet, Rfl: 0 .  metFORMIN (GLUCOPHAGE) 1000 MG tablet, Take 1 tablet (1,000 mg total) by mouth 2 (two) times daily., Disp: 180 tablet, Rfl: 1 .  pioglitazone (ACTOS) 15 MG tablet, Take 1 tablet (15 mg total) by mouth daily., Disp: 90 tablet,  Rfl: 3 .  VICTOZA 18 MG/3ML SOPN, INJECT 1.8MG SQ DAILY, Disp: 9 mL, Rfl: 2 .  mometasone (NASONEX) 50 MCG/ACT nasal spray, Place 2 sprays into the nose daily., Disp: 17 g, Rfl: 12  Allergies  Allergen Reactions  . Latex Rash  . Erythromycin Other (See Comments)    Strawberry tongue     ROS  Ten systems reviewed and is negative except as mentioned in HPI   Objective  Filed Vitals:   08/08/15 1155  BP: 108/58  Pulse: 107  Temp: 97.9 F (36.6 C)  TempSrc: Oral  Resp: 18  Height: _0  (1.6 m)  Weight: 183 lb 9.6 oz (83.28 kg)  SpO2: 95%    Body mass index is 32.53 kg/(m^2).  Physical Exam  Constitutional: Patient appears well-developed and well-nourished. Obese  No distress.  HEENT: head atraumatic, normocephalic, pupils equal and reactive to light, ears  Normal TM bilateral, no pain during palpation,  neck supple, throat within normal limits, normal turbinates, no pain during palpation of maxillary or frontal sinus Cardiovascular: Normal rate, regular rhythm and normal heart sounds.  No murmur heard. No BLE edema. Pulmonary/Chest: Effort normal and breath sounds normal. No respiratory distress. Abdominal: Soft.  There is no tenderness. Psychiatric: Patient has a normal mood and affect. behavior is normal. Judgment and thought content normal.  Recent Results (from the past 2160 hour(s))  Ethanol     Status: None   Collection Time: 05/24/15 10:20 AM  Result Value Ref Range   Alcohol, Ethyl (B) <5 <5 mg/dL    Comment:        LOWEST DETECTABLE LIMIT FOR SERUM ALCOHOL IS 5 mg/dL FOR MEDICAL PURPOSES ONLY   Protime-INR     Status: None   Collection Time: 05/24/15 10:20 AM  Result Value Ref Range   Prothrombin Time 13.0 11.4 - 15.0 seconds   INR 0.96   APTT     Status: None   Collection Time: 05/24/15 10:20 AM  Result Value Ref Range   aPTT 27 24 - 36 seconds  CBC     Status: Abnormal   Collection Time: 05/24/15 10:20 AM  Result Value Ref Range   WBC 8.9 3.6 -  11.0 K/uL   RBC 4.38 3.80 - 5.20 MIL/uL   Hemoglobin 11.8 (L) 12.0 - 16.0 g/dL   HCT 36.5 35.0 - 47.0 %   MCV 83.3 80.0 - 100.0 fL   MCH 27.0 26.0 - 34.0 pg   MCHC 32.4 32.0 - 36.0 g/dL   RDW 14.3 11.5 - 14.5 %   Platelets 300 150 - 440 K/uL  Differential     Status: None   Collection Time: 05/24/15 10:20 AM  Result Value Ref Range  Neutrophils Relative % 67 %   Neutro Abs 6.0 1.4 - 6.5 K/uL   Lymphocytes Relative 23 %   Lymphs Abs 2.1 1.0 - 3.6 K/uL   Monocytes Relative 6 %   Monocytes Absolute 0.5 0.2 - 0.9 K/uL   Eosinophils Relative 3 %   Eosinophils Absolute 0.3 0 - 0.7 K/uL   Basophils Relative 1 %   Basophils Absolute 0.1 0 - 0.1 K/uL  Comprehensive metabolic panel     Status: Abnormal   Collection Time: 05/24/15 10:20 AM  Result Value Ref Range   Sodium 139 135 - 145 mmol/L   Potassium 3.6 3.5 - 5.1 mmol/L   Chloride 105 101 - 111 mmol/L   CO2 25 22 - 32 mmol/L   Glucose, Bld 134 (H) 65 - 99 mg/dL   BUN 14 6 - 20 mg/dL   Creatinine, Ser 0.56 0.44 - 1.00 mg/dL   Calcium 9.4 8.9 - 10.3 mg/dL   Total Protein 7.4 6.5 - 8.1 g/dL   Albumin 3.9 3.5 - 5.0 g/dL   AST 16 15 - 41 U/L   ALT 15 14 - 54 U/L   Alkaline Phosphatase 77 38 - 126 U/L   Total Bilirubin 0.6 0.3 - 1.2 mg/dL   GFR calc non Af Amer >60 >60 mL/min   GFR calc Af Amer >60 >60 mL/min    Comment: (NOTE) The eGFR has been calculated using the CKD EPI equation. This calculation has not been validated in all clinical situations. eGFR's persistently <60 mL/min signify possible Chronic Kidney Disease.    Anion gap 9 5 - 15  Troponin I     Status: None   Collection Time: 05/24/15 10:20 AM  Result Value Ref Range   Troponin I <0.03 <0.031 ng/mL    Comment:        NO INDICATION OF MYOCARDIAL INJURY.   Hemoglobin A1c     Status: Abnormal   Collection Time: 05/24/15 10:20 AM  Result Value Ref Range   Hgb A1c MFr Bld 9.8 (H) 4.0 - 6.0 %  Lipid panel     Status: None   Collection Time: 05/24/15 10:20 AM   Result Value Ref Range   Cholesterol 183 0 - 200 mg/dL   Triglycerides 116 <150 mg/dL   HDL 66 >40 mg/dL   Total CHOL/HDL Ratio 2.8 RATIO   VLDL 23 0 - 40 mg/dL   LDL Cholesterol 94 0 - 99 mg/dL    Comment:        Total Cholesterol/HDL:CHD Risk Coronary Heart Disease Risk Table                     Men   Women  1/2 Average Risk   3.4   3.3  Average Risk       5.0   4.4  2 X Average Risk   9.6   7.1  3 X Average Risk  23.4   11.0        Use the calculated Patient Ratio above and the CHD Risk Table to determine the patient's CHD Risk.        ATP III CLASSIFICATION (LDL):  <100     mg/dL   Optimal  100-129  mg/dL   Near or Above                    Optimal  130-159  mg/dL   Borderline  160-189  mg/dL   High  >190  mg/dL   Very High   Urinalysis complete, with microscopic ([facility] only)     Status: Abnormal   Collection Time: 05/24/15 10:44 AM  Result Value Ref Range   Color, Urine STRAW (A) YELLOW   APPearance CLEAR (A) CLEAR   Glucose, UA NEGATIVE NEGATIVE mg/dL   Bilirubin Urine NEGATIVE NEGATIVE   Ketones, ur NEGATIVE NEGATIVE mg/dL   Specific Gravity, Urine 1.008 1.005 - 1.030   Hgb urine dipstick NEGATIVE NEGATIVE   pH 5.0 5.0 - 8.0   Protein, ur NEGATIVE NEGATIVE mg/dL   Nitrite NEGATIVE NEGATIVE   Leukocytes, UA NEGATIVE NEGATIVE   RBC / HPF NONE SEEN 0 - 5 RBC/hpf   WBC, UA 0-5 0 - 5 WBC/hpf   Bacteria, UA NONE SEEN NONE SEEN   Squamous Epithelial / LPF 0-5 (A) NONE SEEN   Mucous PRESENT   Glucose, capillary     Status: None   Collection Time: 05/24/15  9:23 PM  Result Value Ref Range   Glucose-Capillary 99 65 - 99 mg/dL   Comment 1 Notify RN   Basic metabolic panel     Status: Abnormal   Collection Time: 05/25/15  5:30 AM  Result Value Ref Range   Sodium 141 135 - 145 mmol/L   Potassium 4.2 3.5 - 5.1 mmol/L   Chloride 106 101 - 111 mmol/L   CO2 29 22 - 32 mmol/L   Glucose, Bld 178 (H) 65 - 99 mg/dL   BUN 14 6 - 20 mg/dL   Creatinine, Ser 0.75 0.44 -  1.00 mg/dL   Calcium 8.7 (L) 8.9 - 10.3 mg/dL   GFR calc non Af Amer >60 >60 mL/min   GFR calc Af Amer >60 >60 mL/min    Comment: (NOTE) The eGFR has been calculated using the CKD EPI equation. This calculation has not been validated in all clinical situations. eGFR's persistently <60 mL/min signify possible Chronic Kidney Disease.    Anion gap 6 5 - 15  CBC     Status: Abnormal   Collection Time: 05/25/15  5:30 AM  Result Value Ref Range   WBC 6.4 3.6 - 11.0 K/uL   RBC 3.79 (L) 3.80 - 5.20 MIL/uL   Hemoglobin 10.5 (L) 12.0 - 16.0 g/dL   HCT 31.8 (L) 35.0 - 47.0 %   MCV 83.9 80.0 - 100.0 fL   MCH 27.7 26.0 - 34.0 pg   MCHC 33.0 32.0 - 36.0 g/dL   RDW 14.4 11.5 - 14.5 %   Platelets 268 150 - 440 K/uL  Glucose, capillary     Status: Abnormal   Collection Time: 05/25/15  7:42 AM  Result Value Ref Range   Glucose-Capillary 158 (H) 65 - 99 mg/dL  Glucose, capillary     Status: Abnormal   Collection Time: 05/25/15 11:17 AM  Result Value Ref Range   Glucose-Capillary 178 (H) 65 - 99 mg/dL     PHQ2/9: Depression screen [facility] 2/9 07/07/2015 06/20/2015 03/21/2015  Decreased Interest 0 0 0  Down, Depressed, Hopeless 0 0 0  PHQ - 2 Score 0 0 0     Fall Risk: Fall Risk  07/07/2015 06/20/2015 03/21/2015  Falls in the past year? Yes Yes No  Number falls in past yr: 1 1 -  Injury with Fall? No No -  Risk for fall due to : History of fall(s) - -  Follow up Education provided;Falls prevention discussed - -     Assessment & Plan  1. Eustachian tube dysfunction, right  -  mometasone (NASONEX) 50 MCG/ACT nasal spray; Place 2 sprays into the nose daily.  Dispense: 17 g; Refill: 12   2. Benign essential HTN  - lisinopril (PRINIVIL,ZESTRIL) 40 MG tablet; Take 0.5 tablets (20 mg total) by mouth daily.  Dispense: 90 tablet; Refill: 3   3. Uncontrolled type 2 diabetes mellitus with nephropathy ([place])  She is doing great now, hopefully next hgbA1C will be at goal.

## 2015-09-11 ENCOUNTER — Other Ambulatory Visit: Payer: Self-pay | Admitting: Family Medicine

## 2015-09-11 NOTE — Telephone Encounter (Signed)
Patient requesting refill. 

## 2015-09-12 ENCOUNTER — Ambulatory Visit (INDEPENDENT_AMBULATORY_CARE_PROVIDER_SITE_OTHER): Payer: Commercial Managed Care - HMO | Admitting: Family Medicine

## 2015-09-12 ENCOUNTER — Encounter: Payer: Self-pay | Admitting: Family Medicine

## 2015-09-12 VITALS — BP 120/60 | HR 84 | Temp 99.2°F | Resp 16 | Ht 63.0 in | Wt 179.2 lb

## 2015-09-12 DIAGNOSIS — E1121 Type 2 diabetes mellitus with diabetic nephropathy: Secondary | ICD-10-CM | POA: Diagnosis not present

## 2015-09-12 DIAGNOSIS — E785 Hyperlipidemia, unspecified: Secondary | ICD-10-CM

## 2015-09-12 DIAGNOSIS — G43909 Migraine, unspecified, not intractable, without status migrainosus: Secondary | ICD-10-CM | POA: Diagnosis not present

## 2015-09-12 DIAGNOSIS — J069 Acute upper respiratory infection, unspecified: Secondary | ICD-10-CM | POA: Diagnosis not present

## 2015-09-12 DIAGNOSIS — Z8673 Personal history of transient ischemic attack (TIA), and cerebral infarction without residual deficits: Secondary | ICD-10-CM

## 2015-09-12 DIAGNOSIS — J302 Other seasonal allergic rhinitis: Secondary | ICD-10-CM | POA: Diagnosis not present

## 2015-09-12 DIAGNOSIS — I10 Essential (primary) hypertension: Secondary | ICD-10-CM | POA: Diagnosis not present

## 2015-09-12 DIAGNOSIS — G43109 Migraine with aura, not intractable, without status migrainosus: Secondary | ICD-10-CM

## 2015-09-12 LAB — POCT UA - MICROALBUMIN: Microalbumin Ur, POC: 20 mg/L

## 2015-09-12 LAB — POCT GLYCOSYLATED HEMOGLOBIN (HGB A1C): Hemoglobin A1C: 6.4

## 2015-09-12 MED ORDER — LIRAGLUTIDE 18 MG/3ML ~~LOC~~ SOPN: 1.8000 mg | PEN_INJECTOR | Freq: Every day | SUBCUTANEOUS | Status: DC

## 2015-09-12 MED ORDER — HYDROCOD POLST-CPM POLST ER 10-8 MG/5ML PO SUER: 5.0000 mL | Freq: Two times a day (BID) | ORAL | Status: DC | PRN

## 2015-09-12 NOTE — Progress Notes (Signed)
Name: Christine Roberts   MRN: [id number]    DOB: 1946/06/21   Date:09/12/2015       Progress Note  Subjective  Chief Complaint  Chief Complaint  Patient presents with  . Follow-up    2 month  . Diabetes    Checks sugar once daily, Low-79, High-114  . Hypertension  . URI    Onset-yesterday, color mucus-yellowish-green, headache, sinus pain/pressure, OTC nasacort  . Hyperlipidemia    HPI  TIA versus Complicated migraine: she developed dizziness, headache and aphasia on 11/16 - she was transported by EMS to [facility], she had normal Echo, CT, MRI of brain and carotid dopplers. She was seen by neurologist and advised to continue on aspirin and lipitor in case it was a TIA, but likely a complicated migraine. She has been doing well, no symptoms since. She states only had migraine many years ago when on ocp.  DMII: she is back on Victoza since December and also on Metformin, Actos and Levemir and hgbA1C is back to normal now. Glucose had gone up when off Victoza for over one year. She is on ace for microalbuminuria and denies side effects of medications. She exercises again, avoiding sweets and is doing well.   HTN - bp is back to normal, it was elevated while at [facility] , denies orthostatic changes or palpitation.   URI/AR: yesterday she developed rhinorrhea, cough, nasal congestion, and mild chills. She is using Flonase, but she has not tried anything otc. Cough is dry and worse in am with some sputum when she first gets up. Not a smoker.   Hyperlipidemia: taking lipitor a few times a week to avoid side effects, otherwise she has leg pains.    Patient Active Problem List   Diagnosis Date Noted  . Seasonal allergic rhinitis 09/12/2015  . Malignant essential hypertension 05/25/2015  . TIA (transient ischemic attack) 05/24/2015  . Senile purpura ([place]) 05/09/2015  . Benign essential HTN 03/18/2015  . Pain in shoulder 03/18/2015  . Dyslipidemia 03/18/2015  . History of surgery to major organs,  presenting hazards to health 03/18/2015  . Hearing loss 03/18/2015  . Hiatal hernia 03/18/2015  . History of colon polyps 03/18/2015  . Calculus of kidney 03/18/2015  . B12 deficiency 03/18/2015  . Microalbuminuria 03/18/2015  . Benign melanoma 03/18/2015  . Adult BMI 30+ 03/18/2015  . Arthritis, degenerative 03/18/2015  . Diabetes mellitus with nephropathy ([place]) 03/18/2015    Past Surgical History  Procedure Laterality Date  . Lithotripsy  12/2007  . Myringotomy Right 11/2009  . Cystoscopic    . Cholecystectomy    . Abdominal hysterectomy      Family History  Problem Relation Age of Onset  . Cancer Mother   . Diabetes Mother   . CAD Father   . Diabetes Daughter     Social History   Social History  . Marital Status: Single    Spouse Name: N/A  . Number of Children: N/A  . Years of Education: N/A   Occupational History  . Not on file.   Social History Main Topics  . Smoking status: Never Smoker   . Smokeless tobacco: Never Used  . Alcohol Use: 0.6 oz/week    1 Shots of liquor per week  . Drug Use: No  . Sexual Activity: Not Currently   Other Topics Concern  . Not on file   Social History Narrative     Current outpatient prescriptions:  .  aspirin EC 81 MG tablet, Take 1  tablet (81 mg total) by mouth daily., Disp: 90 tablet, Rfl: 0 .  atorvastatin (LIPITOR) 40 MG tablet, Take 1 tablet (40 mg total) by mouth at bedtime. On Wednesday and Saturday, Disp: 90 tablet, Rfl: 3 .  Blood Glucose Monitoring Suppl (ACCU-CHEK AVIVA PLUS) w/Device KIT, 1 Units by Does not apply route 2 (two) times daily., Disp: 1 kit, Rfl: 0 .  calcium citrate (CALCITRATE - DOSED IN MG ELEMENTAL CALCIUM) 950 MG tablet, Take 200 mg of elemental calcium by mouth daily., Disp: , Rfl:  .  fluticasone (FLONASE) 50 MCG/ACT nasal spray, Place 2 sprays into both nostrils daily., Disp: 16 g, Rfl: 6 .  glucose blood (ACCU-CHEK AVIVA) test strip, Use as instructed, Disp: 100 each, Rfl: 12 .   insulin detemir (LEVEMIR) 100 UNIT/ML injection, Inject 0.3 mLs (30 Units total) into the skin daily., Disp: 10 mL, Rfl: 6 .  Lancet Devices MISC, 1 Units by Does not apply route 2 (two) times daily., Disp: 100 each, Rfl: 3 .  Liraglutide (VICTOZA) 18 MG/3ML SOPN, Inject 0.3 mLs (1.8 mg total) into the skin daily., Disp: 9 mL, Rfl: 2 .  lisinopril (PRINIVIL,ZESTRIL) 40 MG tablet, Take 0.5 tablets (20 mg total) by mouth daily., Disp: 90 tablet, Rfl: 3 .  meloxicam (MOBIC) 15 MG tablet, Take 1 tablet (15 mg total) by mouth daily., Disp: 30 tablet, Rfl: 0 .  metFORMIN (GLUCOPHAGE) 1000 MG tablet, TAKE 1 TABLET TWICE DAILY, Disp: 180 tablet, Rfl: 1  Allergies  Allergen Reactions  . Latex Rash  . Erythromycin Other (See Comments)    Strawberry tongue     ROS  Constitutional: Negative for fever , positive for  weight change.  Respiratory: Positive for cough , but no shortness of breath.   Cardiovascular: Negative for chest pain or palpitations.  Gastrointestinal: Negative for abdominal pain, no bowel changes.  Musculoskeletal: Negative for gait problem or joint swelling.  Skin: Negative for rash.  Neurological: Negative for dizziness or headache.  No other specific complaints in a complete review of systems (except as listed in HPI above).  Objective  Filed Vitals:   09/12/15 1541 09/12/15 1613  BP: 120/60   Pulse: 118 84  Temp: 99.2 F (37.3 C)   TempSrc: Oral   Resp: 16   Height: _0  (1.6 m)   Weight: 179 lb 3.2 oz (81.285 kg)   SpO2: 95%     Body mass index is 31.75 kg/(m^2).  Physical Exam  Constitutional: Patient appears well-developed and well-nourished. Obese  No distress.  HEENT: head atraumatic, normocephalic, pupils equal and reactive to light, ears normal bilaterally, neck supple, throat within normal limits, boggy turbinates Cardiovascular: Normal rate, regular rhythm and normal heart sounds.  No murmur heard. No BLE edema. Pulmonary/Chest: Effort normal and  breath sounds normal. No respiratory distress. Abdominal: Soft.  There is no tenderness. Psychiatric: Patient has a normal mood and affect. behavior is normal. Judgment and thought content normal.  Recent Results (from the past 2160 hour(s))  POCT HgB A1C     Status: None   Collection Time: 09/12/15  3:41 PM  Result Value Ref Range   Hemoglobin A1C 6.4   POCT UA - Microalbumin     Status: None   Collection Time: 09/12/15  3:42 PM  Result Value Ref Range   Microalbumin Ur, POC 20 mg/L   Creatinine, POC  mg/dL   Albumin/Creatinine Ratio, Urine, POC       PHQ2/9: Depression screen [facility] 2/9 09/12/2015 07/07/2015 06/20/2015  03/21/2015  Decreased Interest 0 0 0 0  Down, Depressed, Hopeless 0 0 0 0  PHQ - 2 Score 0 0 0 0     Fall Risk: Fall Risk  09/12/2015 07/07/2015 06/20/2015 03/21/2015  Falls in the past year? No Yes Yes No  Number falls in past yr: - 1 1 -  Injury with Fall? - No No -  Risk for fall due to : - History of fall(s) - -  Follow up - Education provided;Falls prevention discussed - -     Functional Status Survey: Is the patient deaf or have difficulty hearing?: No Does the patient have difficulty seeing, even when wearing glasses/contacts?: No Does the patient have difficulty concentrating, remembering, or making decisions?: No Does the patient have difficulty walking or climbing stairs?: No Does the patient have difficulty dressing or bathing?: No Does the patient have difficulty doing errands alone such as visiting a doctor's office or shopping?: No    Assessment & Plan  1. Diabetes mellitus with nephropathy ([place])  We will try stopping Actos, continue other medication, if fasting level stays below 100, she can trying weaning off Levemir - POCT HgB A1C - POCT UA - Microalbumin - Liraglutide (VICTOZA) 18 MG/3ML SOPN; Inject 0.3 mLs (1.8 mg total) into the skin daily.  Dispense: 9 mL; Refill: 2  2. Benign essential HTN  Doing well   3. Seasonal allergic  rhinitis  Continue nasal steroid  4. Upper respiratory infection  - chlorpheniramine-HYDROcodone (TUSSIONEX PENNKINETIC ER) 10-8 MG/5ML SUER; Take 5 mLs by mouth every 12 (twelve) hours as needed.  Dispense: 140 mL; Refill: 0   5. Complicated migraine  No episodes recently   6. Hx-TIA (transient ischemic attack)  Doing well , no episodes, on aspirin and statin therapy   7. Dyslipidemia  Continue Atorvastatin

## 2015-10-27 ENCOUNTER — Other Ambulatory Visit: Payer: Self-pay | Admitting: Family Medicine

## 2015-10-27 DIAGNOSIS — IMO0002 Reserved for concepts with insufficient information to code with codable children: Secondary | ICD-10-CM

## 2015-10-27 DIAGNOSIS — E1121 Type 2 diabetes mellitus with diabetic nephropathy: Secondary | ICD-10-CM

## 2015-10-27 DIAGNOSIS — E1165 Type 2 diabetes mellitus with hyperglycemia: Principal | ICD-10-CM

## 2015-10-27 MED ORDER — LANCET DEVICES MISC: 1.0000 [IU] | Freq: Two times a day (BID) | Status: DC

## 2015-10-27 NOTE — Telephone Encounter (Signed)
Refill request was sent to Dr. Krichna Sowles for approval and submission.  

## 2015-10-27 NOTE — Telephone Encounter (Signed)
Patient has one novofine 30 gauge needle left. Requesting that you send refill to [facility]

## 2015-11-02 ENCOUNTER — Other Ambulatory Visit: Payer: Self-pay

## 2015-11-02 DIAGNOSIS — E1121 Type 2 diabetes mellitus with diabetic nephropathy: Secondary | ICD-10-CM

## 2015-11-02 MED ORDER — INSULIN PEN NEEDLE 30G X 8 MM MISC: 1.0000 | Status: AC | PRN

## 2015-11-02 NOTE — Telephone Encounter (Signed)
Patient requesting refill. 

## 2015-11-29 ENCOUNTER — Telehealth: Payer: Self-pay | Admitting: Family Medicine

## 2015-11-29 DIAGNOSIS — N200 Calculus of kidney: Secondary | ICD-10-CM

## 2015-11-29 NOTE — Telephone Encounter (Signed)
PT IS NEEDING REFERRAL TO DR COPE IN [place] FOR HER KIDNEY STONES.  SHE WILL BE OUT OF TOWN JUNE 10-16. NEEDS TO BE BEFORE OR ASAP FOR SHE IS HURTING BAD. HURTS ON HER L SIDE

## 2015-12-01 ENCOUNTER — Other Ambulatory Visit: Payer: Self-pay | Admitting: Family Medicine

## 2015-12-01 DIAGNOSIS — N319 Neuromuscular dysfunction of bladder, unspecified: Secondary | ICD-10-CM | POA: Diagnosis not present

## 2015-12-01 DIAGNOSIS — R3129 Other microscopic hematuria: Secondary | ICD-10-CM | POA: Diagnosis not present

## 2015-12-01 DIAGNOSIS — N200 Calculus of kidney: Secondary | ICD-10-CM | POA: Diagnosis not present

## 2015-12-01 DIAGNOSIS — J984 Other disorders of lung: Secondary | ICD-10-CM | POA: Diagnosis not present

## 2015-12-01 DIAGNOSIS — J9811 Atelectasis: Secondary | ICD-10-CM | POA: Diagnosis not present

## 2015-12-01 DIAGNOSIS — R918 Other nonspecific abnormal finding of lung field: Secondary | ICD-10-CM | POA: Diagnosis not present

## 2015-12-01 DIAGNOSIS — R0789 Other chest pain: Secondary | ICD-10-CM | POA: Diagnosis not present

## 2015-12-01 DIAGNOSIS — R0602 Shortness of breath: Secondary | ICD-10-CM | POA: Diagnosis not present

## 2015-12-01 DIAGNOSIS — R05 Cough: Secondary | ICD-10-CM | POA: Diagnosis not present

## 2015-12-01 NOTE — Telephone Encounter (Signed)
Patient requesting refill. 

## 2016-01-12 ENCOUNTER — Ambulatory Visit (INDEPENDENT_AMBULATORY_CARE_PROVIDER_SITE_OTHER): Payer: Commercial Managed Care - HMO | Admitting: Family Medicine

## 2016-01-12 ENCOUNTER — Encounter: Payer: Self-pay | Admitting: Family Medicine

## 2016-01-12 VITALS — BP 116/64 | HR 108 | Temp 98.8°F | Resp 18 | Ht 63.0 in | Wt 162.8 lb

## 2016-01-12 DIAGNOSIS — E785 Hyperlipidemia, unspecified: Secondary | ICD-10-CM

## 2016-01-12 DIAGNOSIS — Z8673 Personal history of transient ischemic attack (TIA), and cerebral infarction without residual deficits: Secondary | ICD-10-CM

## 2016-01-12 DIAGNOSIS — I10 Essential (primary) hypertension: Secondary | ICD-10-CM | POA: Diagnosis not present

## 2016-01-12 DIAGNOSIS — E1121 Type 2 diabetes mellitus with diabetic nephropathy: Secondary | ICD-10-CM | POA: Diagnosis not present

## 2016-01-12 DIAGNOSIS — E538 Deficiency of other specified B group vitamins: Secondary | ICD-10-CM

## 2016-01-12 DIAGNOSIS — D692 Other nonthrombocytopenic purpura: Secondary | ICD-10-CM

## 2016-01-12 LAB — POCT GLYCOSYLATED HEMOGLOBIN (HGB A1C): HEMOGLOBIN A1C: 6.9

## 2016-01-12 MED ORDER — CYANOCOBALAMIN 1000 MCG/ML IJ SOLN
1000.0000 ug | Freq: Once | INTRAMUSCULAR | Status: AC
  Administered 2016-01-12: 1000 ug via INTRAMUSCULAR

## 2016-01-12 MED ORDER — INSULIN DEGLUDEC-LIRAGLUTIDE 100-3.6 UNIT-MG/ML ~~LOC~~ SOPN: 50.0000 [IU] | PEN_INJECTOR | Freq: Every day | SUBCUTANEOUS | Status: DC

## 2016-01-12 NOTE — Progress Notes (Signed)
Name: Christine Roberts   MRN: [id number]    DOB: January 08, 1946   Date:01/12/2016       Progress Note  Subjective  Chief Complaint  Chief Complaint  Patient presents with  . Medication Refill    4 month F/U, needs refill of Meloxicam-seen Dr. Jacqlyn Larsen and states she had some cloudness in her left lung when doing a Chest X-Ray and is supposed to follow up with him.   . Diabetes    Patient states Victoza gets so expensive she is unable to afford it. Checks BS every am Low-66 Average-133 High-150  . Hypertension    Patient is doing well  . Allergic Rhinitis   . Dyslipidemia    HPI  TIA versus Complicated migraine: she developed dizziness, headache and aphasia on 11/16 - she was transported by EMS to [facility], she had normal Echo, CT, MRI of brain and carotid dopplers. She was seen by neurologist and advised to continue on aspirin and lipitor in case it was a TIA, but likely a complicated migraine. No other episodes since. She states only had migraine many years ago when on ocp.  DMII: she is on Metformin, Victoza and Levemir, but has difficulty giving herself multiple shots daily. Discussed Xultophy and she is willing to try. She is on ace for microalbuminuria and denies side effects of medications. She exercises and eating healthy. hgbA1C has gone up a little but still at goal. She denies polyphagia or polydipsia but she has polyuria.   HTN - taking medication, no side effects, no chest pain at this time. She developed some left flank pain and was seen by Urologist, she states the pain also went on the left side of chest. It was constant, like a kidney stone, but has resolved and did not pursued further evaluation. She is doing well now. Last episode a few months ago  Obesity: she has lost 31 lbs since December 2016, she changed her diet, is exercising and is doing very well with life style modification.   Hyperlipidemia: taking lipitor a few times a week to avoid side effects, otherwise she has leg  pains   Patient Active Problem List   Diagnosis Date Noted  . Seasonal allergic rhinitis 09/12/2015  . TIA (transient ischemic attack) 05/24/2015  . Senile purpura ([place]) 05/09/2015  . Benign essential HTN 03/18/2015  . Pain in shoulder 03/18/2015  . Dyslipidemia 03/18/2015  . History of surgery to major organs, presenting hazards to health 03/18/2015  . Hearing loss 03/18/2015  . Hiatal hernia 03/18/2015  . History of colon polyps 03/18/2015  . Calculus of kidney 03/18/2015  . B12 deficiency 03/18/2015  . Microalbuminuria 03/18/2015  . Adult BMI 30+ 03/18/2015  . Arthritis, degenerative 03/18/2015  . Diabetes mellitus with nephropathy ([place]) 03/18/2015    Past Surgical History  Procedure Laterality Date  . Lithotripsy  12/2007  . Myringotomy Right 11/2009  . Cystoscopic    . Cholecystectomy    . Abdominal hysterectomy      Family History  Problem Relation Age of Onset  . Cancer Mother   . Diabetes Mother   . CAD Father   . Diabetes Daughter     Social History   Social History  . Marital Status: Single    Spouse Name: N/A  . Number of Children: N/A  . Years of Education: N/A   Occupational History  . Not on file.   Social History Main Topics  . Smoking status: Never Smoker   . Smokeless tobacco:  Never Used  . Alcohol Use: 0.6 oz/week    1 Shots of liquor per week  . Drug Use: No  . Sexual Activity: Not Currently   Other Topics Concern  . Not on file   Social History Narrative     Current outpatient prescriptions:  .  aspirin EC 81 MG tablet, Take 1 tablet (81 mg total) by mouth daily., Disp: 90 tablet, Rfl: 0 .  atorvastatin (LIPITOR) 40 MG tablet, Take 1 tablet (40 mg total) by mouth at bedtime. On Wednesday and Saturday, Disp: 90 tablet, Rfl: 3 .  Blood Glucose Monitoring Suppl (ACCU-CHEK AVIVA PLUS) w/Device KIT, 1 Units by Does not apply route 2 (two) times daily., Disp: 1 kit, Rfl: 0 .  calcium citrate (CALCITRATE - DOSED IN MG ELEMENTAL  CALCIUM) 950 MG tablet, Take 200 mg of elemental calcium by mouth daily., Disp: , Rfl:  .  fluticasone (FLONASE) 50 MCG/ACT nasal spray, Place 2 sprays into both nostrils daily., Disp: 16 g, Rfl: 6 .  glucose blood (ACCU-CHEK AVIVA) test strip, Use as instructed, Disp: 100 each, Rfl: 12 .  Insulin Pen Needle (NOVOFINE) 30G X 8 MM MISC, Inject 10 each into the skin as needed., Disp: 100 each, Rfl: 2 .  Lancet Devices MISC, 1 Units by Does not apply route 2 (two) times daily., Disp: 100 each, Rfl: 3 .  lisinopril (PRINIVIL,ZESTRIL) 40 MG tablet, Take 0.5 tablets (20 mg total) by mouth daily., Disp: 90 tablet, Rfl: 3 .  metFORMIN (GLUCOPHAGE) 1000 MG tablet, TAKE 1 TABLET TWICE DAILY, Disp: 180 tablet, Rfl: 1 .  Insulin Degludec-Liraglutide (XULTOPHY) 100-3.6 UNIT-MG/ML SOPN, Inject 50 Units into the skin daily., Disp: 12 mL, Rfl: 0  Current facility-administered medications:  .  cyanocobalamin ((VITAMIN B-12)) injection 1,000 mcg, 1,000 mcg, Intramuscular, Once, Steele Sizer, MD  Allergies  Allergen Reactions  . Latex Rash  . Erythromycin Other (See Comments)    Strawberry tongue     ROS  Constitutional: Negative for fever, positive for weight change.  Respiratory: Negative for cough and shortness of breath.   Cardiovascular: Negative for chest pain or palpitations.  Gastrointestinal: Negative for abdominal pain, no bowel changes.  Musculoskeletal: Negative for gait problem or joint swelling.  Skin: Negative for rash.  Neurological: Negative for dizziness or headache.  No other specific complaints in a complete review of systems (except as listed in HPI above).  Objective  Filed Vitals:   01/12/16 1626  BP: 116/64  Pulse: 108  Temp: 98.8 F (37.1 C)  TempSrc: Oral  Resp: 18  Height: _0  (1.6 m)  Weight: 162 lb 12.8 oz (73.846 kg)  SpO2: 98%    Body mass index is 28.85 kg/(m^2).  Physical Exam  Constitutional: Patient appears well-developed and well-nourished. Obese   No distress.  HEENT: head atraumatic, normocephalic, pupils equal and reactive to light, neck supple, throat within normal limits Cardiovascular: Normal rate, regular rhythm and normal heart sounds.  No murmur heard. No BLE edema. Pulmonary/Chest: Effort normal and breath sounds normal. No respiratory distress. Abdominal: Soft.  There is no tenderness. Psychiatric: Patient has a normal mood and affect. behavior is normal. Judgment and thought content normal.  Recent Results (from the past 2160 hour(s))  POCT glycosylated hemoglobin (Hb A1C)     Status: None   Collection Time: 01/12/16  4:54 PM  Result Value Ref Range   Hemoglobin A1C 6.9       PHQ2/9: Depression screen [facility] 2/9 01/12/2016 09/12/2015 07/07/2015 06/20/2015 03/21/2015  Decreased Interest 0 0 0 0 0  Down, Depressed, Hopeless 0 0 0 0 0  PHQ - 2 Score 0 0 0 0 0     Fall Risk: Fall Risk  01/12/2016 09/12/2015 07/07/2015 06/20/2015 03/21/2015  Falls in the past year? No No Yes Yes No  Number falls in past yr: - - 1 1 -  Injury with Fall? - - No No -  Risk for fall due to : - - History of fall(s) - -  Follow up - - Education provided;Falls prevention discussed - -     Functional Status Survey: Is the patient deaf or have difficulty hearing?: No Does the patient have difficulty seeing, even when wearing glasses/contacts?: No Does the patient have difficulty concentrating, remembering, or making decisions?: No Does the patient have difficulty walking or climbing stairs?: No Does the patient have difficulty dressing or bathing?: No Does the patient have difficulty doing errands alone such as visiting a doctor's office or shopping?: No    Assessment & Plan  1. Diabetes mellitus with nephropathy ([facility])  - POCT glycosylated hemoglobin (Hb A1C) 6.9%  - Insulin Degludec-Liraglutide (XULTOPHY) 100-3.6 UNIT-MG/ML SOPN; Inject 50 Units into the skin daily.  Dispense: 12 mL; Refill: 0 Discussed starting at 25 units and titrate up  by 2 units every 2 days to goal fasting of 90-140   2. Benign essential HTN  No dizziness, bp is towards low end of normal, but is taking ace for kidney protection  3. Hx-TIA (transient ischemic attack)  Continue aspirin and lipitor  4. Dyslipidemia  Continue lipitor as tolerated  5. Senile purpura ([facility])  stable  6. B12 deficiency  - cyanocobalamin ((VITAMIN B-12)) injection 1,000 mcg; Inject 1 mL (1,000 mcg total) into the muscle once.

## 2016-01-29 ENCOUNTER — Other Ambulatory Visit: Payer: Self-pay | Admitting: Family Medicine

## 2016-01-29 ENCOUNTER — Telehealth: Payer: Self-pay | Admitting: Family Medicine

## 2016-01-29 DIAGNOSIS — E1121 Type 2 diabetes mellitus with diabetic nephropathy: Secondary | ICD-10-CM

## 2016-01-29 MED ORDER — INSULIN DEGLUDEC-LIRAGLUTIDE 100-3.6 UNIT-MG/ML ~~LOC~~ SOPN
50.0000 [IU] | PEN_INJECTOR | Freq: Every day | SUBCUTANEOUS | 2 refills | Status: DC
Start: 2016-01-29 — End: 2016-02-05

## 2016-01-29 NOTE — Telephone Encounter (Signed)
done

## 2016-01-30 ENCOUNTER — Other Ambulatory Visit: Payer: Self-pay | Admitting: Family Medicine

## 2016-01-30 NOTE — Telephone Encounter (Signed)
Pt confirmed

## 2016-01-30 NOTE — Telephone Encounter (Signed)
Patient requesting refill. 

## 2016-02-01 ENCOUNTER — Telehealth: Payer: Self-pay | Admitting: Family Medicine

## 2016-02-02 NOTE — Telephone Encounter (Signed)
Did you call the drug rep ? I can split to Victoza and [place] if not approved

## 2016-02-02 NOTE — Telephone Encounter (Signed)
Preferred Medications on [employer] Drug List for Medicare Part D are:  Trulicity, Lantus/Lantus Solostar or [place].

## 2016-02-02 NOTE — Telephone Encounter (Signed)
Patient Insurance does not cover Xultophy due to it being on Medicare part D Non-Fomulary List. Please change to a formulary insulin for patient. Thanks

## 2016-02-04 NOTE — Telephone Encounter (Signed)
Can I do a physician call to insurance? Please give her samples if out of medication. Thank you

## 2016-02-05 ENCOUNTER — Other Ambulatory Visit: Payer: Self-pay | Admitting: Family Medicine

## 2016-02-05 NOTE — Telephone Encounter (Signed)
We need to find out if she wants to go back to Levemir and Victoza or Trulicity ( once a week ) and levemir. Or Trulicity and [name]?

## 2016-02-05 NOTE — Telephone Encounter (Signed)
Called patient and left message due to Insurance will not cover Xultophy due to Medicare Part D.

## 2016-02-07 NOTE — Telephone Encounter (Signed)
Just waiting for patient to call us back and inform of [place] of her decision on which insulin she would like to try, since her Insurance denied Xultophy.

## 2016-02-13 ENCOUNTER — Other Ambulatory Visit: Payer: Self-pay | Admitting: Family Medicine

## 2016-02-13 DIAGNOSIS — I10 Essential (primary) hypertension: Secondary | ICD-10-CM

## 2016-02-14 NOTE — Telephone Encounter (Signed)
Patient requesting refill of Lisinopril be sent to [facility].

## 2016-02-23 NOTE — Telephone Encounter (Signed)
If this is complete, please close chart. Thank you

## 2016-02-27 ENCOUNTER — Telehealth: Payer: Self-pay | Admitting: Family Medicine

## 2016-02-27 NOTE — Telephone Encounter (Signed)
Ask her to call her insurance first to find out if she is in the donut whole. If she is, she can contact [facility] and see if she qualifies to get medication from them.

## 2016-02-27 NOTE — Telephone Encounter (Signed)
Patient notified to call [facility] Patient Assistance Programs [phone] to see if she would qualify due to being in the donut hole.

## 2016-02-29 NOTE — Telephone Encounter (Signed)
Please close chart

## 2016-03-14 NOTE — Telephone Encounter (Signed)
Please close chart

## 2016-04-02 NOTE — Telephone Encounter (Signed)
Please close chart. Thank you °

## 2016-04-11 NOTE — Progress Notes (Unsigned)
Insurance would not cover Xultophy asking Dr. Ancil Boozer to switch medication.

## 2016-04-16 ENCOUNTER — Telehealth: Payer: Self-pay

## 2016-04-16 NOTE — Telephone Encounter (Signed)
Patient states she is in the [facility] and is unable to afford Victoza and her Insurance completely denied the [place]. Patient states she is paying for the Levemir out of pocket right now which is $100. But states the Victoza is unaffordable. Patient states she has applied to patient assistant and was denied due to income was too high. Dr. Ancil Boozer said we could give her Victoza samples until she is back out of the donut hole.

## 2016-05-05 NOTE — Telephone Encounter (Signed)
Yes. Please give her samples of Victoza. Ask drug rep if necessary. Thank you

## 2016-05-06 NOTE — Telephone Encounter (Signed)
[place] I was not due to there is no room in the refri due to flu vaccinations. Let me know when they go down and I will ask Marnette Burgess about using the ref and getting more.

## 2016-05-07 ENCOUNTER — Other Ambulatory Visit: Payer: Self-pay

## 2016-05-07 MED ORDER — INSULIN DETEMIR 100 UNIT/ML ~~LOC~~ SOLN: SUBCUTANEOUS | 11 refills | Status: DC

## 2016-05-07 NOTE — Telephone Encounter (Signed)
Patient requesting refill of Levemir to [facility].

## 2016-05-10 ENCOUNTER — Encounter: Payer: Self-pay | Admitting: Family Medicine

## 2016-05-10 ENCOUNTER — Ambulatory Visit (INDEPENDENT_AMBULATORY_CARE_PROVIDER_SITE_OTHER): Payer: Commercial Managed Care - HMO | Admitting: Family Medicine

## 2016-05-10 VITALS — BP 116/64 | HR 92 | Temp 99.1°F | Resp 16 | Ht 63.0 in | Wt 166.3 lb

## 2016-05-10 DIAGNOSIS — Z8673 Personal history of transient ischemic attack (TIA), and cerebral infarction without residual deficits: Secondary | ICD-10-CM

## 2016-05-10 DIAGNOSIS — I10 Essential (primary) hypertension: Secondary | ICD-10-CM | POA: Diagnosis not present

## 2016-05-10 DIAGNOSIS — E1121 Type 2 diabetes mellitus with diabetic nephropathy: Secondary | ICD-10-CM | POA: Diagnosis not present

## 2016-05-10 DIAGNOSIS — R2231 Localized swelling, mass and lump, right upper limb: Secondary | ICD-10-CM | POA: Diagnosis not present

## 2016-05-10 DIAGNOSIS — E538 Deficiency of other specified B group vitamins: Secondary | ICD-10-CM | POA: Diagnosis not present

## 2016-05-10 DIAGNOSIS — E785 Hyperlipidemia, unspecified: Secondary | ICD-10-CM | POA: Diagnosis not present

## 2016-05-10 DIAGNOSIS — Z23 Encounter for immunization: Secondary | ICD-10-CM

## 2016-05-10 DIAGNOSIS — D692 Other nonthrombocytopenic purpura: Secondary | ICD-10-CM | POA: Diagnosis not present

## 2016-05-10 LAB — POCT GLYCOSYLATED HEMOGLOBIN (HGB A1C): Hemoglobin A1C: 6.7

## 2016-05-10 MED ORDER — ATORVASTATIN CALCIUM 40 MG PO TABS: 40.0000 mg | ORAL_TABLET | Freq: Every day | ORAL | 0 refills | Status: DC

## 2016-05-10 MED ORDER — METFORMIN HCL 1000 MG PO TABS: 1000.0000 mg | ORAL_TABLET | Freq: Two times a day (BID) | ORAL | 1 refills | Status: DC

## 2016-05-10 MED ORDER — LISINOPRIL 20 MG PO TABS: 20.0000 mg | ORAL_TABLET | Freq: Every day | ORAL | 1 refills | Status: DC

## 2016-05-10 NOTE — Progress Notes (Signed)
Name: Christine Roberts   MRN: [id number]    DOB: 07/03/1946   Date:05/10/2016       Progress Note  Subjective  Chief Complaint  Chief Complaint  Patient presents with  . Medication Refill    4 month F/U  . Diabetes    120-130avg 75 low 165 high pt stated that she just had her victoza filled so A1C may be high  . Hypertension  . Dyslipidemia  . Allergic Rhinitis   . Cyst    right uppr backside of arm  . Flu Vaccine    HPI  TIA versus Complicated migraine: she developed dizziness, headache and aphasia on 11/16 - she was transported by EMS to [facility], she had normal Echo, CT, MRI of brain and carotid dopplers. She was seen by neurologist and advised to continue on aspirin and lipitor in case it was a TIA, but likely a complicated migraine. No other episodes since. She states only had migraine many years ago when on ocp. No recurrence of symptoms. She is feeling well.  DMII: she is on Metformin and Levemir, off Victoza because of cost.. She is on ace for microalbuminuria and denies side effects of medications. She exercises and eating healthy. hgbA1C is at goal today. She needs eye examl. She denies polyphagia or polydipsia, but she has nocturia - not usually bothersome during the day, she has a follow up with Dr. Jacqlyn Larsen  HTN - taking medication, no side effects, no chest pain or palpitation. BP has been towards low end of normal but she denies dizziness.   Obesity: she has lost 31 lbs since December 2016, she gained a few pounds since last visit but she has been out of Victoza for 3 months, she changed her diet, is exercising and is doing very well with life style modification.   Right knee pain: she states pain on right knee is intermittent, aggravated by activity and cold weather, and better at rest. No swelling or redness. No instability. Advised Tylenol prn   Hyperlipidemia: taking lipitor a few times a week to avoid side effects, otherwise she has leg pains  Patient Active Problem List    Diagnosis Date Noted  . Seasonal allergic rhinitis 09/12/2015  . TIA (transient ischemic attack) 05/24/2015  . Senile purpura ([place]) 05/09/2015  . Benign essential HTN 03/18/2015  . Pain in shoulder 03/18/2015  . Dyslipidemia 03/18/2015  . History of surgery to major organs, presenting hazards to health 03/18/2015  . Hearing loss 03/18/2015  . Hiatal hernia 03/18/2015  . History of colon polyps 03/18/2015  . Calculus of kidney 03/18/2015  . B12 deficiency 03/18/2015  . Microalbuminuria 03/18/2015  . Adult BMI 30+ 03/18/2015  . Arthritis, degenerative 03/18/2015  . Diabetes mellitus with nephropathy ([place]) 03/18/2015    Past Surgical History:  Procedure Laterality Date  . ABDOMINAL HYSTERECTOMY    . CHOLECYSTECTOMY    . cystoscopic    . LITHOTRIPSY  12/2007  . MYRINGOTOMY Right 11/2009    Family History  Problem Relation Age of Onset  . Cancer Mother   . Diabetes Mother   . CAD Father   . Diabetes Daughter     Social History   Social History  . Marital status: Single    Spouse name: N/A  . Number of children: N/A  . Years of education: N/A   Occupational History  . Not on file.   Social History Main Topics  . Smoking status: Never Smoker  . Smokeless tobacco: Never Used  .  Alcohol use 0.6 oz/week    1 Shots of liquor per week  . Drug use: No  . Sexual activity: Not Currently   Other Topics Concern  . Not on file   Social History Narrative  . No narrative on file     Current Outpatient Prescriptions:  .  aspirin EC 81 MG tablet, Take 1 tablet (81 mg total) by mouth daily., Disp: 90 tablet, Rfl: 0 .  atorvastatin (LIPITOR) 40 MG tablet, Take 1 tablet (40 mg total) by mouth at bedtime., Disp: 90 tablet, Rfl: 0 .  Blood Glucose Monitoring Suppl (ACCU-CHEK AVIVA PLUS) w/Device KIT, 1 Units by Does not apply route 2 (two) times daily., Disp: 1 kit, Rfl: 0 .  calcium citrate (CALCITRATE - DOSED IN MG ELEMENTAL CALCIUM) 950 MG tablet, Take 200 mg of  elemental calcium by mouth daily., Disp: , Rfl:  .  fluticasone (FLONASE) 50 MCG/ACT nasal spray, Place 2 sprays into both nostrils daily., Disp: 16 g, Rfl: 6 .  glucose blood (ACCU-CHEK AVIVA) test strip, Use as instructed, Disp: 100 each, Rfl: 12 .  insulin detemir (LEVEMIR) 100 UNIT/ML injection, Inject 30 units SQ once daily, Disp: 10 mL, Rfl: 11 .  Insulin Pen Needle (NOVOFINE) 30G X 8 MM MISC, Inject 10 each into the skin as needed., Disp: 100 each, Rfl: 2 .  Lancet Devices MISC, 1 Units by Does not apply route 2 (two) times daily., Disp: 100 each, Rfl: 3 .  lisinopril (PRINIVIL,ZESTRIL) 20 MG tablet, Take 1 tablet (20 mg total) by mouth daily., Disp: 90 tablet, Rfl: 1 .  metFORMIN (GLUCOPHAGE) 1000 MG tablet, Take 1 tablet (1,000 mg total) by mouth 2 (two) times daily., Disp: 180 tablet, Rfl: 1  Allergies  Allergen Reactions  . Latex Rash  . Erythromycin Other (See Comments)    Strawberry tongue     ROS  Constitutional: Negative for fever or weight change.  Respiratory: Negative for cough and shortness of breath.   Cardiovascular: Negative for chest pain or palpitations.  Gastrointestinal: Negative for abdominal pain, no bowel changes.  Musculoskeletal: Negative for gait problem or joint swelling.  Skin: Negative for rash.  Neurological: Negative for dizziness or headache.  No other specific complaints in a complete review of systems (except as listed in HPI above).  Objective  Vitals:   05/10/16 1356  BP: 116/64  Pulse: 92  Resp: 16  Temp: 99.1 F (37.3 C)  SpO2: 95%  Weight: 166 lb 5 oz (75.4 kg)  Height: 5' 3"  (1.6 m)    Body mass index is 29.46 kg/m.  Physical Exam  Constitutional: Patient appears well-developed and well-nourished. Obese No distress.  HEENT: head atraumatic, normocephalic, pupils equal and reactive to light,  neck supple, throat within normal limits Cardiovascular: Normal rate, regular rhythm and normal heart sounds.  No murmur heard. No  BLE edema. Pulmonary/Chest: Effort normal and breath sounds normal. No respiratory distress. Abdominal: Soft.  There is no tenderness. Psychiatric: Patient has a normal mood and affect. behavior is normal. Judgment and thought content normal. Muscular Skeletal: mass on right arm, possibly on triceps muscle, tender to touch  Recent Results (from the past 2160 hour(s))  POCT HgB A1C     Status: None   Collection Time: 05/10/16  2:08 PM  Result Value Ref Range   Hemoglobin A1C 6.7     Diabetic Foot Exam: Diabetic Foot Exam - Simple   Simple Foot Form Diabetic Foot exam was performed with the following findings:  Yes 05/10/2016  2:14 PM  Visual Inspection No deformities, no ulcerations, no other skin breakdown bilaterally:  Yes Sensation Testing Intact to touch and monofilament testing bilaterally:  Yes Pulse Check Posterior Tibialis and Dorsalis pulse intact bilaterally:  Yes Comments      PHQ2/9: Depression screen [facility] 2/9 05/10/2016 01/12/2016 09/12/2015 07/07/2015 06/20/2015  Decreased Interest 0 0 0 0 0  Down, Depressed, Hopeless 0 0 0 0 0  PHQ - 2 Score 0 0 0 0 0     Fall Risk: Fall Risk  05/10/2016 01/12/2016 09/12/2015 07/07/2015 06/20/2015  Falls in the past year? No No No Yes Yes  Number falls in past yr: - - - 1 1  Injury with Fall? - - - No No  Risk for fall due to : - - - History of fall(s) -  Follow up - - - Education provided;Falls prevention discussed -     Functional Status Survey: Is the patient deaf or have difficulty hearing?: No Does the patient have difficulty seeing, even when wearing glasses/contacts?: No Does the patient have difficulty concentrating, remembering, or making decisions?: No Does the patient have difficulty walking or climbing stairs?: No Does the patient have difficulty dressing or bathing?: No Does the patient have difficulty doing errands alone such as visiting a doctor's office or shopping?: No    Assessment & Plan  1. Diabetes  mellitus with nephropathy (HCC)  - POCT HgB A1C - metFORMIN (GLUCOPHAGE) 1000 MG tablet; Take 1 tablet (1,000 mg total) by mouth 2 (two) times daily.  Dispense: 180 tablet; Refill: 1  2. Need for influenza vaccination  - Flu vaccine HIGH DOSE PF (Fluzone High dose)  3. Hx-TIA (transient ischemic attack)  Needs to continue statin therapy, only taking statin twice weekly  4. Dyslipidemia  - atorvastatin (LIPITOR) 40 MG tablet; Take 1 tablet (40 mg total) by mouth at bedtime.  Dispense: 90 tablet; Refill: 0  5. Senile purpura (HCC)  reassurance   6. Benign essential HTN  - lisinopril (PRINIVIL,ZESTRIL) 20 MG tablet; Take 1 tablet (20 mg total) by mouth daily.  Dispense: 90 tablet; Refill: 1  7. B12 deficiency  Continue otc supplementation    8. Mass of arm, right  It feels like it is inside, on distal aspect of right arm, neal elbow, possibly inside triceps muscle  - Ambulatory referral to General Surgery

## 2016-05-14 ENCOUNTER — Other Ambulatory Visit: Payer: Self-pay | Admitting: Family Medicine

## 2016-05-14 ENCOUNTER — Telehealth: Payer: Self-pay | Admitting: Family Medicine

## 2016-05-14 MED ORDER — DICLOFENAC SODIUM 1 % TD GEL: 2.0000 g | Freq: Four times a day (QID) | TRANSDERMAL | 2 refills | Status: DC

## 2016-05-14 NOTE — Telephone Encounter (Signed)
Pt is asking for refill on generic on Voltaren Gel to be sent to [facility].

## 2016-05-14 NOTE — Telephone Encounter (Signed)
done

## 2016-05-14 NOTE — Telephone Encounter (Signed)
Pt informed

## 2016-05-20 ENCOUNTER — Encounter: Payer: Self-pay | Admitting: *Deleted

## 2016-05-23 ENCOUNTER — Inpatient Hospital Stay: Payer: Self-pay

## 2016-05-23 ENCOUNTER — Ambulatory Visit (INDEPENDENT_AMBULATORY_CARE_PROVIDER_SITE_OTHER): Payer: Commercial Managed Care - HMO | Admitting: General Surgery

## 2016-05-23 ENCOUNTER — Encounter: Payer: Self-pay | Admitting: General Surgery

## 2016-05-23 VITALS — BP 112/66 | HR 72 | Resp 12 | Ht 62.0 in | Wt 170.0 lb

## 2016-05-23 DIAGNOSIS — R2231 Localized swelling, mass and lump, right upper limb: Secondary | ICD-10-CM | POA: Diagnosis not present

## 2016-05-23 NOTE — Patient Instructions (Addendum)
The patient is aware to call back for any questions or concerns. Return in 3 months to re-evaluate

## 2016-05-23 NOTE — Progress Notes (Signed)
Patient ID: Christine Roberts, female   DOB: 08-04-1945, 70 y.o.   MRN: [id number]  Chief Complaint  Patient presents with  . Mass    HPI Christine Roberts is a 70 y.o. female here today for a evaluation of a right posterior arm mass. Patient noticed this area at least 6 months ago. She states it is tender to touch but it is not incapacitating. It has not changed in size. No history of ny similar mass elsewhere. I have reviewed the history of present illness with the patient.  HPI  Past Medical History:  Diagnosis Date  . Diabetes mellitus without complication ([place])   . Hyperlipidemia   . Hypertension   . Kidney stone   . Left shoulder pain   . Obesity   . Osteoarthritis   . Ovarian failure   . Vitamin B12 deficiency (non anemic)     Past Surgical History:  Procedure Laterality Date  . ABDOMINAL HYSTERECTOMY    . CHOLECYSTECTOMY    . COLONOSCOPY  2015  . cystoscopic    . LITHOTRIPSY  12/2007  . MYRINGOTOMY Right 11/2009    Family History  Problem Relation Age of Onset  . Cancer Mother   . Diabetes Mother   . CAD Father   . Diabetes Daughter     Social History Social History  Substance Use Topics  . Smoking status: Never Smoker  . Smokeless tobacco: Never Used  . Alcohol use 0.6 oz/week    1 Shots of liquor per week    Allergies  Allergen Reactions  . Latex Rash  . Erythromycin Other (See Comments)    Strawberry tongue    Current Outpatient Prescriptions  Medication Sig Dispense Refill  . aspirin EC 81 MG tablet Take 1 tablet (81 mg total) by mouth daily. 90 tablet 0  . atorvastatin (LIPITOR) 40 MG tablet Take 1 tablet (40 mg total) by mouth at bedtime. 90 tablet 0  . Blood Glucose Monitoring Suppl (ACCU-CHEK AVIVA PLUS) w/Device KIT 1 Units by Does not apply route 2 (two) times daily. 1 kit 0  . calcium citrate (CALCITRATE - DOSED IN MG ELEMENTAL CALCIUM) 950 MG tablet Take 200 mg of elemental calcium by mouth daily.    . diclofenac sodium (VOLTAREN) 1 % GEL  Apply 2 g topically 4 (four) times daily. 100 g 2  . fluticasone (FLONASE) 50 MCG/ACT nasal spray Place 2 sprays into both nostrils daily. 16 g 6  . glucose blood (ACCU-CHEK AVIVA) test strip Use as instructed 100 each 12  . insulin detemir (LEVEMIR) 100 UNIT/ML injection Inject 30 units SQ once daily 10 mL 11  . Insulin Pen Needle (NOVOFINE) 30G X 8 MM MISC Inject 10 each into the skin as needed. 100 each 2  . Lancet Devices MISC 1 Units by Does not apply route 2 (two) times daily. 100 each 3  . lisinopril (PRINIVIL,ZESTRIL) 20 MG tablet Take 1 tablet (20 mg total) by mouth daily. 90 tablet 1  . metFORMIN (GLUCOPHAGE) 1000 MG tablet Take 1 tablet (1,000 mg total) by mouth 2 (two) times daily. 180 tablet 1   No current facility-administered medications for this visit.     Review of Systems Review of Systems  Constitutional: Negative.   Respiratory: Negative.   Cardiovascular: Negative.     Blood pressure 112/66, pulse 72, resp. rate 12, height 5' 2"  (1.575 m), weight 170 lb (77.1 kg), SpO2 96 %.  Physical Exam Physical Exam  Constitutional: She is oriented to  person, place, and time. She appears well-developed and well-nourished.  HENT:  Mouth/Throat: Oropharynx is clear and moist.  Eyes: Conjunctivae are normal. No scleral icterus.  Neck: Neck supple.  Cardiovascular: Normal rate, regular rhythm and normal heart sounds.   Pulmonary/Chest: Effort normal and breath sounds normal.  Lymphadenopathy:    She has no cervical adenopathy.    She has no axillary adenopathy.  Neurological: She is alert and oriented to person, place, and time.  Skin: Skin is warm and dry.     Psychiatric: Her behavior is normal.    Data Reviewed Notes reviewed   Assessment    Most likley lipoma, right arm Ultrasound showed hyperechoic, oblong mass overlying the muscle consistent with a subfascial/intramuscular lipoma. Max measurement is approximately 3 cm in size    Plan   Discussed option of  excision in SDS or periodic assessment for symptoms and/orchange in size  She is comfortable with periodic follow up Return in 3 months to re-evaluate   This information has been scribed by Karie Fetch RN, BSN,BC.        SANKAR,SEEPLAPUTHUR G 05/23/2016, 3:04 PM

## 2016-06-17 NOTE — Telephone Encounter (Signed)
Please give her a sample, they can drop off the same day she can pick it up

## 2016-06-26 MED ORDER — INSULIN DETEMIR 100 UNIT/ML ~~LOC~~ SOLN: 25.0000 [IU] | Freq: Two times a day (BID) | SUBCUTANEOUS | 11 refills | Status: DC

## 2016-06-26 MED ORDER — LIRAGLUTIDE 18 MG/3ML ~~LOC~~ SOPN: 1.2000 mg | PEN_INJECTOR | Freq: Every day | SUBCUTANEOUS | 0 refills | Status: DC

## 2016-07-16 ENCOUNTER — Encounter: Payer: Self-pay | Admitting: Family Medicine

## 2016-07-16 ENCOUNTER — Ambulatory Visit (INDEPENDENT_AMBULATORY_CARE_PROVIDER_SITE_OTHER): Payer: Commercial Managed Care - HMO | Admitting: Family Medicine

## 2016-07-16 VITALS — BP 114/68 | HR 105 | Temp 99.3°F | Resp 20 | Ht 62.0 in | Wt 170.1 lb

## 2016-07-16 DIAGNOSIS — R05 Cough: Secondary | ICD-10-CM

## 2016-07-16 DIAGNOSIS — R059 Cough, unspecified: Secondary | ICD-10-CM

## 2016-07-16 DIAGNOSIS — R6883 Chills (without fever): Secondary | ICD-10-CM | POA: Diagnosis not present

## 2016-07-16 LAB — POCT INFLUENZA A/B
INFLUENZA B, POC: NEGATIVE
Influenza A, POC: NEGATIVE

## 2016-07-16 MED ORDER — FLUTICASONE FUROATE-VILANTEROL 200-25 MCG/INH IN AEPB: 1.0000 | INHALATION_SPRAY | Freq: Every day | RESPIRATORY_TRACT | 0 refills | Status: DC

## 2016-07-16 NOTE — Progress Notes (Signed)
Name: Christine Roberts   MRN: [id number]    DOB: 10/24/1945   Date:07/16/2016       Progress Note  Subjective  Chief Complaint  Chief Complaint  Patient presents with  . Fever  . Generalized Body Aches    for 1 day  . Cough    clear mucus and for 1 day    HPI  She states symptoms started with a dry cough two days ago, felt much worse last night, with severe headache, mild clear rhinorrhea, cough at times is productive and white sputum, she has lack of appetite. She denies wheezing or SOB. She is feeling very tired, she states her daughter that had the flu a few weeks ago.   Patient Active Problem List   Diagnosis Date Noted  . Seasonal allergic rhinitis 09/12/2015  . TIA (transient ischemic attack) 05/24/2015  . Senile purpura ([place]) 05/09/2015  . Benign essential HTN 03/18/2015  . Pain in shoulder 03/18/2015  . Dyslipidemia 03/18/2015  . History of surgery to major organs, presenting hazards to health 03/18/2015  . Hearing loss 03/18/2015  . Hiatal hernia 03/18/2015  . History of colon polyps 03/18/2015  . Calculus of kidney 03/18/2015  . B12 deficiency 03/18/2015  . Microalbuminuria 03/18/2015  . Adult BMI 30+ 03/18/2015  . Arthritis, degenerative 03/18/2015  . Diabetes mellitus with nephropathy ([place]) 03/18/2015    Past Surgical History:  Procedure Laterality Date  . ABDOMINAL HYSTERECTOMY    . CHOLECYSTECTOMY    . COLONOSCOPY  2015  . cystoscopic    . LITHOTRIPSY  12/2007  . MYRINGOTOMY Right 11/2009    Family History  Problem Relation Age of Onset  . Cancer Mother   . Diabetes Mother   . CAD Father   . Diabetes Daughter     Social History   Social History  . Marital status: Single    Spouse name: N/A  . Number of children: N/A  . Years of education: N/A   Occupational History  . Not on file.   Social History Main Topics  . Smoking status: Never Smoker  . Smokeless tobacco: Never Used  . Alcohol use 0.6 oz/week    1 Shots of liquor per week  . Drug  use: No  . Sexual activity: Not Currently   Other Topics Concern  . Not on file   Social History Narrative  . No narrative on file     Current Outpatient Prescriptions:  .  aspirin EC 81 MG tablet, Take 1 tablet (81 mg total) by mouth daily., Disp: 90 tablet, Rfl: 0 .  atorvastatin (LIPITOR) 40 MG tablet, Take 1 tablet (40 mg total) by mouth at bedtime., Disp: 90 tablet, Rfl: 0 .  Blood Glucose Monitoring Suppl (ACCU-CHEK AVIVA PLUS) w/Device KIT, 1 Units by Does not apply route 2 (two) times daily., Disp: 1 kit, Rfl: 0 .  calcium citrate (CALCITRATE - DOSED IN MG ELEMENTAL CALCIUM) 950 MG tablet, Take 200 mg of elemental calcium by mouth daily., Disp: , Rfl:  .  diclofenac sodium (VOLTAREN) 1 % GEL, Apply 2 g topically 4 (four) times daily., Disp: 100 g, Rfl: 2 .  fluticasone (FLONASE) 50 MCG/ACT nasal spray, Place 2 sprays into both nostrils daily., Disp: 16 g, Rfl: 6 .  fluticasone furoate-vilanterol (BREO ELLIPTA) 200-25 MCG/INH AEPB, Inhale 1 puff into the lungs daily., Disp: 14 each, Rfl: 0 .  glucose blood (ACCU-CHEK AVIVA) test strip, Use as instructed, Disp: 100 each, Rfl: 12 .  insulin detemir (  LEVEMIR) 100 UNIT/ML injection, Inject 0.25 mLs (25 Units total) into the skin 2 (two) times daily. Injects 15 units in the morning and 10 units in the pm., Disp: 10 mL, Rfl: 11 .  Insulin Pen Needle (NOVOFINE) 30G X 8 MM MISC, Inject 10 each into the skin as needed., Disp: 100 each, Rfl: 2 .  Lancet Devices MISC, 1 Units by Does not apply route 2 (two) times daily., Disp: 100 each, Rfl: 3 .  liraglutide 18 MG/3ML SOPN, Inject 0.2 mLs (1.2 mg total) into the skin daily., Disp: 6 mL, Rfl: 0 .  lisinopril (PRINIVIL,ZESTRIL) 20 MG tablet, Take 1 tablet (20 mg total) by mouth daily., Disp: 90 tablet, Rfl: 1 .  metFORMIN (GLUCOPHAGE) 1000 MG tablet, Take 1 tablet (1,000 mg total) by mouth 2 (two) times daily., Disp: 180 tablet, Rfl: 1  Allergies  Allergen Reactions  . Latex Rash  .  Erythromycin Other (See Comments)    Strawberry tongue     ROS  Ten systems reviewed and is negative except as mentioned in HPI   Objective  Vitals:   07/16/16 1156  BP: 114/68  Pulse: (!) 105  Resp: 20  Temp: 99.3 F (37.4 C)  TempSrc: Oral  SpO2: 96%  Weight: 170 lb 1.6 oz (77.2 kg)  Height: _0  (1.575 m)    Body mass index is 31.11 kg/m.  Physical Exam  Constitutional: Patient appears well-developed and well-nourished. Obese  No distress.  HEENT: head atraumatic, normocephalic, pupils equal and reactive to light, normal TM, boggy turbinates, neck supple, throat within normal limits Cardiovascular: Normal rate, regular rhythm and normal heart sounds.  No murmur heard. No BLE edema. Pulmonary/Chest: Effort normal and breath sounds normal. No respiratory distress. Abdominal: Soft.  There is no tenderness. Psychiatric: Patient has a normal mood and affect. behavior is normal. Judgment and thought content normal.  Recent Results (from the past 2160 hour(s))  POCT HgB A1C     Status: None   Collection Time: 05/10/16  2:08 PM  Result Value Ref Range   Hemoglobin A1C 6.7   POCT Influenza A/B     Status: Normal   Collection Time: 07/16/16 12:49 PM  Result Value Ref Range   Influenza A, POC Negative Negative   Influenza B, POC Negative Negative     PHQ2/9: Depression screen [facility] 2/9 05/10/2016 01/12/2016 09/12/2015 07/07/2015 06/20/2015  Decreased Interest 0 0 0 0 0  Down, Depressed, Hopeless 0 0 0 0 0  PHQ - 2 Score 0 0 0 0 0     Fall Risk: Fall Risk  05/10/2016 01/12/2016 09/12/2015 07/07/2015 06/20/2015  Falls in the past year? No No No Yes Yes  Number falls in past yr: - - - 1 1  Injury with Fall? - - - No No  Risk for fall due to : - - - History of fall(s) -  Follow up - - - Education provided;Falls prevention discussed -      Assessment & Plan  1. Cough  - fluticasone furoate-vilanterol (BREO ELLIPTA) 200-25 MCG/INH AEPB; Inhale 1 puff into the lungs  daily.  Dispense: 14 each; Refill: 0 - Rapid Influenza A&B Antigens ([facility] only)  2. Chills  - fluticasone furoate-vilanterol (BREO ELLIPTA) 200-25 MCG/INH AEPB; Inhale 1 puff into the lungs daily.  Dispense: 14 each; Refill: 0 - Rapid Influenza A&B Antigens ([facility] only)

## 2016-07-23 ENCOUNTER — Telehealth: Payer: Self-pay | Admitting: Family Medicine

## 2016-07-23 NOTE — Telephone Encounter (Signed)
Pt states she needs a note today stating it is ok for her to go back to work today. It needs state that she is physically healthy and able to come back to work as of today. Pt would like this to be faxed to [phone]. Pt states she was out last Tues-Friday due to her sickness and came in last week to see Dr Ancil Boozer. Please advise as to if and when this can be done.

## 2016-07-23 NOTE — Telephone Encounter (Signed)
Letter has been written and faxed to [phone].  confirmation received.

## 2016-07-30 DIAGNOSIS — L710 Perioral dermatitis: Secondary | ICD-10-CM | POA: Diagnosis not present

## 2016-08-26 ENCOUNTER — Ambulatory Visit: Payer: Commercial Managed Care - HMO | Admitting: General Surgery

## 2016-08-28 ENCOUNTER — Encounter: Payer: Self-pay | Admitting: General Surgery

## 2016-08-28 ENCOUNTER — Ambulatory Visit (INDEPENDENT_AMBULATORY_CARE_PROVIDER_SITE_OTHER): Payer: Medicare HMO | Admitting: General Surgery

## 2016-08-28 VITALS — BP 130/72 | HR 80 | Resp 14 | Ht 63.0 in | Wt 176.0 lb

## 2016-08-28 DIAGNOSIS — R2231 Localized swelling, mass and lump, right upper limb: Secondary | ICD-10-CM | POA: Diagnosis not present

## 2016-08-28 NOTE — Patient Instructions (Signed)
The patient is aware to call back for any questions or concerns.  

## 2016-08-28 NOTE — Progress Notes (Signed)
Patient ID: Christine Roberts, female   DOB: [birth year], 71 y.o.   MRN: [id number]  Chief Complaint  Patient presents with  . Follow-up    HPI Christine Roberts is a 71 y.o. female.  Follow up right arm mass. She states the knot is unchanged and is not painful.I have reviewed the history of present illness with the patient.  HPI  Past Medical History:  Diagnosis Date  . Diabetes mellitus without complication ([place])   . Hyperlipidemia   . Hypertension   . Kidney stone   . Left shoulder pain   . Obesity   . Osteoarthritis   . Ovarian failure   . Vitamin B12 deficiency (non anemic)     Past Surgical History:  Procedure Laterality Date  . ABDOMINAL HYSTERECTOMY    . CHOLECYSTECTOMY    . COLONOSCOPY  2015  . cystoscopic    . LITHOTRIPSY  12/2007  . MYRINGOTOMY Right 11/2009    Family History  Problem Relation Age of Onset  . Cancer Mother   . Diabetes Mother   . CAD Father   . Diabetes Daughter     Social History Social History  Substance Use Topics  . Smoking status: Never Smoker  . Smokeless tobacco: Never Used  . Alcohol use 0.6 oz/week    1 Shots of liquor per week    Allergies  Allergen Reactions  . Latex Rash  . Erythromycin Other (See Comments)    Strawberry tongue    Current Outpatient Prescriptions  Medication Sig Dispense Refill  . aspirin EC 81 MG tablet Take 1 tablet (81 mg total) by mouth daily. 90 tablet 0  . atorvastatin (LIPITOR) 40 MG tablet Take 1 tablet (40 mg total) by mouth at bedtime. 90 tablet 0  . Blood Glucose Monitoring Suppl (ACCU-CHEK AVIVA PLUS) w/Device KIT 1 Units by Does not apply route 2 (two) times daily. 1 kit 0  . calcium citrate (CALCITRATE - DOSED IN MG ELEMENTAL CALCIUM) 950 MG tablet Take 200 mg of elemental calcium by mouth daily.    . diclofenac sodium (VOLTAREN) 1 % GEL Apply 2 g topically 4 (four) times daily. 100 g 2  . fluticasone (FLONASE) 50 MCG/ACT nasal spray Place 2 sprays into both nostrils daily. 16 g 6  .  fluticasone furoate-vilanterol (BREO ELLIPTA) 200-25 MCG/INH AEPB Inhale 1 puff into the lungs daily. 14 each 0  . glucose blood (ACCU-CHEK AVIVA) test strip Use as instructed 100 each 12  . insulin detemir (LEVEMIR) 100 UNIT/ML injection Inject 0.25 mLs (25 Units total) into the skin 2 (two) times daily. Injects 15 units in the morning and 10 units in the pm. 10 mL 11  . Insulin Pen Needle (NOVOFINE) 30G X 8 MM MISC Inject 10 each into the skin as needed. 100 each 2  . Lancet Devices MISC 1 Units by Does not apply route 2 (two) times daily. 100 each 3  . liraglutide 18 MG/3ML SOPN Inject 0.2 mLs (1.2 mg total) into the skin daily. 6 mL 0  . lisinopril (PRINIVIL,ZESTRIL) 20 MG tablet Take 1 tablet (20 mg total) by mouth daily. 90 tablet 1  . metFORMIN (GLUCOPHAGE) 1000 MG tablet Take 1 tablet (1,000 mg total) by mouth 2 (two) times daily. 180 tablet 1   No current facility-administered medications for this visit.     Review of Systems Review of Systems  Constitutional: Negative.   Respiratory: Negative.   Cardiovascular: Negative.     Blood pressure 130/72, pulse 80,  resp. rate 14, height _0  (1.6 m), weight 176 lb (79.8 kg).  Physical Exam Physical Exam  Constitutional: She is oriented to person, place, and time. She appears well-developed and well-nourished.  Neurological: She is alert and oriented to person, place, and time.  Skin: Skin is warm and dry.     Psychiatric: Her behavior is normal.    Data Reviewed Prior notes  Assessment     Benign  lipoma, right arm, stable size, no symptoms    Plan    Follow up in 6 months. The patient is aware to call back for any questions or new concerns.      This information has been scribed by Karie Fetch RN, BSN,BC.   Christine Roberts G 08/29/2016, 2:51 PM

## 2016-09-09 ENCOUNTER — Ambulatory Visit (INDEPENDENT_AMBULATORY_CARE_PROVIDER_SITE_OTHER): Payer: Medicare HMO | Admitting: Family Medicine

## 2016-09-09 ENCOUNTER — Encounter: Payer: Self-pay | Admitting: Family Medicine

## 2016-09-09 ENCOUNTER — Other Ambulatory Visit: Payer: Self-pay | Admitting: Family Medicine

## 2016-09-09 VITALS — BP 124/68 | HR 84 | Temp 97.7°F | Resp 16 | Ht 63.0 in | Wt 171.2 lb

## 2016-09-09 DIAGNOSIS — IMO0002 Reserved for concepts with insufficient information to code with codable children: Secondary | ICD-10-CM

## 2016-09-09 DIAGNOSIS — E1165 Type 2 diabetes mellitus with hyperglycemia: Secondary | ICD-10-CM | POA: Diagnosis not present

## 2016-09-09 DIAGNOSIS — E538 Deficiency of other specified B group vitamins: Secondary | ICD-10-CM

## 2016-09-09 DIAGNOSIS — D692 Other nonthrombocytopenic purpura: Secondary | ICD-10-CM

## 2016-09-09 DIAGNOSIS — E1121 Type 2 diabetes mellitus with diabetic nephropathy: Secondary | ICD-10-CM

## 2016-09-09 DIAGNOSIS — E785 Hyperlipidemia, unspecified: Secondary | ICD-10-CM | POA: Diagnosis not present

## 2016-09-09 DIAGNOSIS — I10 Essential (primary) hypertension: Secondary | ICD-10-CM

## 2016-09-09 LAB — POCT GLYCOSYLATED HEMOGLOBIN (HGB A1C): Hemoglobin A1C: 8.5

## 2016-09-09 MED ORDER — LISINOPRIL 20 MG PO TABS: 20.0000 mg | ORAL_TABLET | Freq: Every day | ORAL | 1 refills | Status: DC

## 2016-09-09 MED ORDER — LIRAGLUTIDE 18 MG/3ML ~~LOC~~ SOPN: 1.8000 mg | PEN_INJECTOR | Freq: Every day | SUBCUTANEOUS | 0 refills | Status: DC

## 2016-09-09 MED ORDER — METFORMIN HCL 1000 MG PO TABS: 1000.0000 mg | ORAL_TABLET | Freq: Two times a day (BID) | ORAL | 1 refills | Status: DC

## 2016-09-09 MED ORDER — INSULIN DEGLUDEC 200 UNIT/ML ~~LOC~~ SOPN: 20.0000 [IU] | PEN_INJECTOR | Freq: Every day | SUBCUTANEOUS | 2 refills | Status: DC

## 2016-09-09 NOTE — Progress Notes (Signed)
Name: Christine Roberts   MRN: [id number]    DOB: 28-Jan-1946   Date:09/09/2016       Progress Note  Subjective  Chief Complaint  Chief Complaint  Patient presents with  . Diabetes    4 month follow up 120-150 avg 230 high  . Obesity  . Hyperlipidemia  . Hypertension    HPI  TIA versus Complicated migraine: she developed dizziness, headache and aphasia on 11/16 - she was transported by EMS to [facility], she had normal Echo, CT, MRI of brain and carotid dopplers. She was seen by neurologist and advised to continue on aspirin and lipitor in case it was a TIA, but likely a complicated migraine. No other episodes since. She states only had migraine many years ago when on ocp. No recurrence of symptoms. She is feeling well.  DMII: she is on Metformin and Levemir, back on  Victozat. She is on ace for microalbuminuria and denies side effects of medications. She exercises but is not eating healthy.. hgbA1C is going up.. She needs eye exam. She denies polyphagia or polydipsia, she still has nocturia.   HTN - taking medication, no side effects, no chest pain or palpitation. BP has well controlled  Obesity: she has lost 31 lbs since December 2016, but weight has been stable now, back on Victoza  Hyperlipidemia: taking lipitor a few times a week to avoid side effects, otherwise she has leg pains   Patient Active Problem List   Diagnosis Date Noted  . Seasonal allergic rhinitis 09/12/2015  . TIA (transient ischemic attack) 05/24/2015  . Senile purpura ([place]) 05/09/2015  . Benign essential HTN 03/18/2015  . Pain in shoulder 03/18/2015  . Dyslipidemia 03/18/2015  . History of surgery to major organs, presenting hazards to health 03/18/2015  . Hearing loss 03/18/2015  . Hiatal hernia 03/18/2015  . History of colon polyps 03/18/2015  . Calculus of kidney 03/18/2015  . B12 deficiency 03/18/2015  . Microalbuminuria 03/18/2015  . Adult BMI 30+ 03/18/2015  . Arthritis, degenerative 03/18/2015  .  Diabetes mellitus with nephropathy ([place]) 03/18/2015    Past Surgical History:  Procedure Laterality Date  . ABDOMINAL HYSTERECTOMY    . CHOLECYSTECTOMY    . COLONOSCOPY  2015  . cystoscopic    . LITHOTRIPSY  12/2007  . MYRINGOTOMY Right 11/2009    Family History  Problem Relation Age of Onset  . Cancer Mother   . Diabetes Mother   . CAD Father   . Diabetes Daughter     Social History   Social History  . Marital status: Single    Spouse name: N/A  . Number of children: N/A  . Years of education: N/A   Occupational History  . Not on file.   Social History Main Topics  . Smoking status: Never Smoker  . Smokeless tobacco: Never Used  . Alcohol use 0.6 oz/week    1 Shots of liquor per week  . Drug use: No  . Sexual activity: Not Currently   Other Topics Concern  . Not on file   Social History Narrative  . No narrative on file     Current Outpatient Prescriptions:  .  aspirin EC 81 MG tablet, Take 1 tablet (81 mg total) by mouth daily., Disp: 90 tablet, Rfl: 0 .  atorvastatin (LIPITOR) 40 MG tablet, Take 1 tablet (40 mg total) by mouth at bedtime., Disp: 90 tablet, Rfl: 0 .  Blood Glucose Monitoring Suppl (ACCU-CHEK AVIVA PLUS) w/Device KIT, 1 Units by Does  not apply route 2 (two) times daily., Disp: 1 kit, Rfl: 0 .  calcium citrate (CALCITRATE - DOSED IN MG ELEMENTAL CALCIUM) 950 MG tablet, Take 200 mg of elemental calcium by mouth daily., Disp: , Rfl:  .  diclofenac sodium (VOLTAREN) 1 % GEL, Apply 2 g topically 4 (four) times daily., Disp: 100 g, Rfl: 2 .  fluticasone (FLONASE) 50 MCG/ACT nasal spray, Place 2 sprays into both nostrils daily., Disp: 16 g, Rfl: 6 .  glucose blood (ACCU-CHEK AVIVA) test strip, Use as instructed, Disp: 100 each, Rfl: 12 .  Insulin Degludec (TRESIBA FLEXTOUCH) 200 UNIT/ML SOPN, Inject 20-50 Units into the skin daily., Disp: 9 mL, Rfl: 2 .  Insulin Pen Needle (NOVOFINE) 30G X 8 MM MISC, Inject 10 each into the skin as needed., Disp:  100 each, Rfl: 2 .  Lancet Devices MISC, 1 Units by Does not apply route 2 (two) times daily., Disp: 100 each, Rfl: 3 .  liraglutide 18 MG/3ML SOPN, Inject 0.3 mLs (1.8 mg total) into the skin daily., Disp: 9 mL, Rfl: 0 .  lisinopril (PRINIVIL,ZESTRIL) 20 MG tablet, Take 1 tablet (20 mg total) by mouth daily., Disp: 90 tablet, Rfl: 1 .  metFORMIN (GLUCOPHAGE) 1000 MG tablet, Take 1 tablet (1,000 mg total) by mouth 2 (two) times daily., Disp: 180 tablet, Rfl: 1  Allergies  Allergen Reactions  . Latex Rash  . Erythromycin Other (See Comments)    Strawberry tongue     ROS  Constitutional: Negative for fever or weight change.  Respiratory: Negative for cough and shortness of breath.   Cardiovascular: Negative for chest pain or palpitations.  Gastrointestinal: Negative for abdominal pain, no bowel changes.  Musculoskeletal: Negative for gait problem or joint swelling.  Skin: Negative for rash.  Neurological: Negative for dizziness or headache.  No other specific complaints in a complete review of systems (except as listed in HPI above).  Objective  Vitals:   09/09/16 1429  BP: 124/68  Pulse: 84  Resp: 16  Temp: 97.7 F (36.5 C)  SpO2: 95%  Weight: 171 lb 3 oz (77.7 kg)  Height: 5' 3" (1.6 m)    Body mass index is 30.32 kg/m.  Physical Exam  Constitutional: Patient appears well-developed and well-nourished. Obese  No distress.  HEENT: head atraumatic, normocephalic, pupils equal and reactive to light, neck supple, throat within normal limits Cardiovascular: Normal rate, regular rhythm and normal heart sounds.  No murmur heard. No BLE edema. Pulmonary/Chest: Effort normal and breath sounds normal. No respiratory distress. Abdominal: Soft.  There is no tenderness. Psychiatric: Patient has a normal mood and affect. behavior is normal. Judgment and thought content normal.   Recent Results (from the past 2160 hour(s))  POCT Influenza A/B     Status: Normal   Collection  Time: 07/16/16 12:49 PM  Result Value Ref Range   Influenza A, POC Negative Negative   Influenza B, POC Negative Negative  POCT HgB A1C     Status: Abnormal   Collection Time: 09/09/16  2:33 PM  Result Value Ref Range   Hemoglobin A1C 8.5       PHQ2/9: Depression screen [facility] 2/9 09/09/2016 05/10/2016 01/12/2016 09/12/2015 07/07/2015  Decreased Interest 0 0 0 0 0  Down, Depressed, Hopeless 0 0 0 0 0  PHQ - 2 Score 0 0 0 0 0     Fall Risk: Fall Risk  09/09/2016 05/10/2016 01/12/2016 09/12/2015 07/07/2015  Falls in the past year? No No No No Yes  Number  falls in past yr: - - - - 1  Injury with Fall? - - - - No  Risk for fall due to : - - - - History of fall(s)  Follow up - - - - Education provided;Falls prevention discussed     Assessment & Plan  1. Uncontrolled type 2 diabetes mellitus with nephropathy (HCC)  We will change from Levemir to [place] and titrate by 2 units every 3 days to get fasting between 120-140. - POCT HgB A1C - metFORMIN (GLUCOPHAGE) 1000 MG tablet; Take 1 tablet (1,000 mg total) by mouth 2 (two) times daily.  Dispense: 180 tablet; Refill: 1 - Insulin Degludec (TRESIBA FLEXTOUCH) 200 UNIT/ML SOPN; Inject 20-50 Units into the skin daily.  Dispense: 9 mL; Refill: 2 - liraglutide 18 MG/3ML SOPN; Inject 0.3 mLs (1.8 mg total) into the skin daily.  Dispense: 9 mL; Refill: 0  2. Senile purpura (HCC)  stable  3. Benign essential HTN  - lisinopril (PRINIVIL,ZESTRIL) 20 MG tablet; Take 1 tablet (20 mg total) by mouth daily.  Dispense: 90 tablet; Refill: 1  4. B12 deficiency  Continue supplementation   5. Dyslipidemia  Only taking statin a couple of times a week

## 2016-09-10 DIAGNOSIS — L710 Perioral dermatitis: Secondary | ICD-10-CM | POA: Diagnosis not present

## 2016-10-16 NOTE — Telephone Encounter (Signed)
Please close chart. It will not allow me to close this on my end. Thank you

## 2016-11-12 DIAGNOSIS — L821 Other seborrheic keratosis: Secondary | ICD-10-CM | POA: Diagnosis not present

## 2016-11-12 DIAGNOSIS — L719 Rosacea, unspecified: Secondary | ICD-10-CM | POA: Diagnosis not present

## 2016-11-12 DIAGNOSIS — L710 Perioral dermatitis: Secondary | ICD-10-CM | POA: Diagnosis not present

## 2016-11-26 DIAGNOSIS — H2513 Age-related nuclear cataract, bilateral: Secondary | ICD-10-CM | POA: Diagnosis not present

## 2016-11-26 LAB — HM DIABETES EYE EXAM

## 2016-11-27 ENCOUNTER — Encounter: Payer: Self-pay | Admitting: Family Medicine

## 2017-01-01 ENCOUNTER — Other Ambulatory Visit: Payer: Self-pay | Admitting: Family Medicine

## 2017-01-01 NOTE — Telephone Encounter (Signed)
Patient requesting refill of Victoza to [facility].  

## 2017-01-02 ENCOUNTER — Other Ambulatory Visit: Payer: Self-pay | Admitting: Family Medicine

## 2017-01-02 DIAGNOSIS — E1121 Type 2 diabetes mellitus with diabetic nephropathy: Secondary | ICD-10-CM

## 2017-01-02 DIAGNOSIS — H029 Unspecified disorder of eyelid: Secondary | ICD-10-CM | POA: Diagnosis not present

## 2017-01-02 DIAGNOSIS — E785 Hyperlipidemia, unspecified: Secondary | ICD-10-CM

## 2017-01-02 DIAGNOSIS — IMO0002 Reserved for concepts with insufficient information to code with codable children: Secondary | ICD-10-CM

## 2017-01-02 DIAGNOSIS — E1165 Type 2 diabetes mellitus with hyperglycemia: Secondary | ICD-10-CM

## 2017-01-03 NOTE — Telephone Encounter (Signed)
Patient requesting refill of Atorvastatin and Metformin to [facility].

## 2017-01-10 ENCOUNTER — Ambulatory Visit (INDEPENDENT_AMBULATORY_CARE_PROVIDER_SITE_OTHER): Payer: Medicare HMO | Admitting: Family Medicine

## 2017-01-10 ENCOUNTER — Encounter: Payer: Self-pay | Admitting: Family Medicine

## 2017-01-10 VITALS — BP 108/62 | HR 82 | Temp 98.1°F | Resp 16 | Ht 63.0 in | Wt 180.4 lb

## 2017-01-10 DIAGNOSIS — E1121 Type 2 diabetes mellitus with diabetic nephropathy: Secondary | ICD-10-CM | POA: Diagnosis not present

## 2017-01-10 DIAGNOSIS — E538 Deficiency of other specified B group vitamins: Secondary | ICD-10-CM | POA: Diagnosis not present

## 2017-01-10 DIAGNOSIS — D692 Other nonthrombocytopenic purpura: Secondary | ICD-10-CM | POA: Diagnosis not present

## 2017-01-10 DIAGNOSIS — E1165 Type 2 diabetes mellitus with hyperglycemia: Secondary | ICD-10-CM | POA: Diagnosis not present

## 2017-01-10 DIAGNOSIS — I10 Essential (primary) hypertension: Secondary | ICD-10-CM | POA: Diagnosis not present

## 2017-01-10 DIAGNOSIS — Z87442 Personal history of urinary calculi: Secondary | ICD-10-CM | POA: Diagnosis not present

## 2017-01-10 DIAGNOSIS — R899 Unspecified abnormal finding in specimens from other organs, systems and tissues: Secondary | ICD-10-CM | POA: Diagnosis not present

## 2017-01-10 DIAGNOSIS — E785 Hyperlipidemia, unspecified: Secondary | ICD-10-CM | POA: Diagnosis not present

## 2017-01-10 DIAGNOSIS — IMO0002 Reserved for concepts with insufficient information to code with codable children: Secondary | ICD-10-CM

## 2017-01-10 LAB — POCT UA - MICROALBUMIN: Microalbumin Ur, POC: 20 mg/L

## 2017-01-10 LAB — POCT GLYCOSYLATED HEMOGLOBIN (HGB A1C): Hemoglobin A1C: 7.1

## 2017-01-10 MED ORDER — LIRAGLUTIDE 18 MG/3ML ~~LOC~~ SOPN: PEN_INJECTOR | SUBCUTANEOUS | 5 refills | Status: DC

## 2017-01-10 MED ORDER — INSULIN DEGLUDEC 200 UNIT/ML ~~LOC~~ SOPN: 20.0000 [IU] | PEN_INJECTOR | Freq: Every day | SUBCUTANEOUS | 5 refills | Status: DC

## 2017-01-10 NOTE — Progress Notes (Signed)
Name: Christine Roberts   MRN: [id number]    DOB: 25-Jul-1945   Date:01/10/2017       Progress Note  Subjective  Chief Complaint  Chief Complaint  Patient presents with  . Medication Refill    4 month F/U  . Diabetes    Checks BS daily, Low-93 Average-100 Highest-133  . Hyperlipidemia  . Obesity    Has gained 9 pounds since last visit  . Hypertension    Denies any symptoms  . Transient Ischemic Attack    HPI  DMII: she is on Metformin, Tresiba and Victoza.  She is on ace for microalbuminuria and denies side effects of medications. She exercises but is not eating healthy.. hgbA1C has improved since last visit. Eye exam is up to date.  She denies polyphagia or polydipsia, she still has nocturia.   HTN - taking medication, no side effects, no chest pain or palpitation. BP has well controlled  Obesity: she has lost 31 lbs from  December 2016 to Dec 2017, gained 8 lbs since last visit, she states Victoza is not curbing her appetite as much. She is not really following a healthy diet. " I eat what I want to"  Hyperlipidemia: taking lipitor a few times a week to avoid side effects, otherwise she has leg pains  TIA versus Complicated migraine: she developed dizziness, headache and aphasia on 11/16 - she was transported by EMS to [facility], she had normal Echo, CT, MRI of brain and carotid dopplers. She was seen by neurologist and advised to continue on aspirin and lipitor in case it was a TIA, but likely a complicated migraine. No other episodes since. She states only had migraine many years ago when on ocp. No recurrence of symptoms. No recurrence since initial episode  Stiffness: she denies joint pain or swelling, but feels stiff when sitting for a long time, she is taking frequent breaks at work to help with her joints.   Patient Active Problem List   Diagnosis Date Noted  . Seasonal allergic rhinitis 09/12/2015  . TIA (transient ischemic attack) 05/24/2015  . Senile purpura ([place]) 05/09/2015   . Benign essential HTN 03/18/2015  . Pain in shoulder 03/18/2015  . Dyslipidemia 03/18/2015  . History of surgery to major organs, presenting hazards to health 03/18/2015  . Hearing loss 03/18/2015  . Hiatal hernia 03/18/2015  . History of colon polyps 03/18/2015  . Calculus of kidney 03/18/2015  . B12 deficiency 03/18/2015  . Microalbuminuria 03/18/2015  . Adult BMI 30+ 03/18/2015  . Arthritis, degenerative 03/18/2015  . Diabetes mellitus with nephropathy ([place]) 03/18/2015    Past Surgical History:  Procedure Laterality Date  . ABDOMINAL HYSTERECTOMY    . CHOLECYSTECTOMY    . COLONOSCOPY  2015  . cystoscopic    . LITHOTRIPSY  12/2007  . MYRINGOTOMY Right 11/2009    Family History  Problem Relation Age of Onset  . Cancer Mother   . Diabetes Mother   . CAD Father   . Diabetes Daughter     Social History   Social History  . Marital status: Single    Spouse name: N/A  . Number of children: N/A  . Years of education: N/A   Occupational History  . Not on file.   Social History Main Topics  . Smoking status: Never Smoker  . Smokeless tobacco: Never Used  . Alcohol use 0.6 oz/week    1 Shots of liquor per week  . Drug use: No  . Sexual activity: Not  Currently   Other Topics Concern  . Not on file   Social History Narrative  . No narrative on file     Current Outpatient Prescriptions:  .  ACCU-CHEK AVIVA PLUS test strip, USE AS DIRECTED, Disp: 50 each, Rfl: 25 .  aspirin EC 81 MG tablet, Take 1 tablet (81 mg total) by mouth daily., Disp: 90 tablet, Rfl: 0 .  atorvastatin (LIPITOR) 40 MG tablet, TAKE 1 TABLET (40 MG TOTAL) BY MOUTH AT BEDTIME., Disp: 90 tablet, Rfl: 1 .  Blood Glucose Monitoring Suppl (ACCU-CHEK AVIVA PLUS) w/Device KIT, 1 Units by Does not apply route 2 (two) times daily., Disp: 1 kit, Rfl: 0 .  calcium citrate (CALCITRATE - DOSED IN MG ELEMENTAL CALCIUM) 950 MG tablet, Take 200 mg of elemental calcium by mouth daily., Disp: , Rfl:  .   diclofenac sodium (VOLTAREN) 1 % GEL, Apply 2 g topically 4 (four) times daily., Disp: 100 g, Rfl: 2 .  doxycycline (MONODOX) 100 MG capsule, Take 100 mg by mouth., Disp: , Rfl:  .  fluticasone (FLONASE) 50 MCG/ACT nasal spray, Place 2 sprays into both nostrils daily., Disp: 16 g, Rfl: 6 .  Insulin Degludec (TRESIBA FLEXTOUCH) 200 UNIT/ML SOPN, Inject 20-50 Units into the skin daily. (Patient taking differently: Inject 18 Units into the skin daily. ), Disp: 9 mL, Rfl: 2 .  Insulin Pen Needle (NOVOFINE) 30G X 8 MM MISC, Inject 10 each into the skin as needed., Disp: 100 each, Rfl: 2 .  Lancet Devices MISC, 1 Units by Does not apply route 2 (two) times daily., Disp: 100 each, Rfl: 3 .  lisinopril (PRINIVIL,ZESTRIL) 20 MG tablet, Take 1 tablet (20 mg total) by mouth daily., Disp: 90 tablet, Rfl: 1 .  metFORMIN (GLUCOPHAGE) 1000 MG tablet, TAKE 1 TABLET (1,000 MG TOTAL) BY MOUTH 2 (TWO) TIMES DAILY., Disp: 180 tablet, Rfl: 1 .  metroNIDAZOLE (METROGEL) 1 % gel, Apply topically., Disp: , Rfl:  .  VICTOZA 18 MG/3ML SOPN, INJECT 1.8MG SUBCUTANEOUSLY DAILY, Disp: 9 mL, Rfl: 0  Allergies  Allergen Reactions  . Latex Rash  . Erythromycin Other (See Comments)    Strawberry tongue     ROS  Constitutional: Negative for fever , positive for weight change.  Respiratory: Negative for cough and shortness of breath.   Cardiovascular: Negative for chest pain or palpitations.  Gastrointestinal: Negative for abdominal pain, no bowel changes.  Musculoskeletal: Negative for gait problem or joint swelling.  Skin: Negative for rash.  Neurological: Negative for dizziness or headache.  No other specific complaints in a complete review of systems (except as listed in HPI above).   Objective  Vitals:   01/10/17 1336  BP: 108/62  Pulse: 82  Resp: 16  Temp: 98.1 F (36.7 C)  TempSrc: Oral  SpO2: 95%  Weight: 180 lb 6.4 oz (81.8 kg)  Height: 5' 3"  (1.6 m)    Body mass index is 31.96 kg/m.  Physical  Exam  Constitutional: Patient appears well-developed and well-nourished. Obese No distress.  HEENT: head atraumatic, normocephalic, pupils equal and reactive to light,  neck supple, throat within normal limits Cardiovascular: Normal rate, regular rhythm and normal heart sounds.  No murmur heard. No BLE edema. Pulmonary/Chest: Effort normal and breath sounds normal. No respiratory distress. Abdominal: Soft.  There is no tenderness. Psychiatric: Patient has a normal mood and affect. behavior is normal. Judgment and thought content normal. Muscular Skeletal: no crepitus with extension of joints.   Recent Results (from the past 2160  hour(s))  HM DIABETES EYE EXAM     Status: None   Collection Time: 11/26/16 12:00 AM  Result Value Ref Range   HM Diabetic Eye Exam No Retinopathy No Retinopathy    Comment: [facility], Dr. Sandra Cockayne   POCT UA - Microalbumin     Status: None   Collection Time: 01/10/17  1:39 PM  Result Value Ref Range   Microalbumin Ur, POC 20 mg/L   Creatinine, POC  mg/dL   Albumin/Creatinine Ratio, Urine, POC    POCT HgB A1C     Status: Abnormal   Collection Time: 01/10/17  1:44 PM  Result Value Ref Range   Hemoglobin A1C 7.1     PHQ2/9: Depression screen [facility] 2/9 01/10/2017 09/09/2016 05/10/2016 01/12/2016 09/12/2015  Decreased Interest 0 0 0 0 0  Down, Depressed, Hopeless 0 0 0 0 0  PHQ - 2 Score 0 0 0 0 0     Fall Risk: Fall Risk  01/10/2017 09/09/2016 05/10/2016 01/12/2016 09/12/2015  Falls in the past year? No No No No No  Number falls in past yr: - - - - -  Injury with Fall? - - - - -  Risk for fall due to : - - - - -  Follow up - - - - -     Functional Status Survey: Is the patient deaf or have difficulty hearing?: No Does the patient have difficulty seeing, even when wearing glasses/contacts?: No Does the patient have difficulty concentrating, remembering, or making decisions?: No Does the patient have difficulty walking or climbing stairs?: No Does the  patient have difficulty dressing or bathing?: No Does the patient have difficulty doing errands alone such as visiting a doctor's office or shopping?: No    Assessment & Plan  1. Diabetes mellitus with nephropathy ([place])  - POCT HgB A1C - POCT UA - Microalbumin  2. Senile purpura (HCC)  stable  3. Benign essential HTN  - CBC with Differential/Platelet - COMPLETE METABOLIC PANEL WITH GFR  4. B12 deficiency  - Vitamin B12  5. Dyslipidemia  - Lipid panel  6. History of kidney stones  No problems in the past 2 years  7. Abnormal laboratory test  - VITAMIN D 25 Hydroxy (Vit-D Deficiency, Fractures)

## 2017-01-14 DIAGNOSIS — Z1231 Encounter for screening mammogram for malignant neoplasm of breast: Secondary | ICD-10-CM | POA: Diagnosis not present

## 2017-01-14 LAB — HM MAMMOGRAPHY

## 2017-02-12 ENCOUNTER — Encounter: Payer: Self-pay | Admitting: Family Medicine

## 2017-02-24 ENCOUNTER — Ambulatory Visit: Payer: Medicare HMO | Admitting: General Surgery

## 2017-02-26 ENCOUNTER — Ambulatory Visit (INDEPENDENT_AMBULATORY_CARE_PROVIDER_SITE_OTHER): Payer: Medicare HMO | Admitting: General Surgery

## 2017-02-26 ENCOUNTER — Encounter: Payer: Self-pay | Admitting: General Surgery

## 2017-02-26 VITALS — BP 118/68 | HR 78 | Resp 12 | Ht 63.0 in | Wt 179.0 lb

## 2017-02-26 DIAGNOSIS — R2231 Localized swelling, mass and lump, right upper limb: Secondary | ICD-10-CM

## 2017-02-26 NOTE — Patient Instructions (Signed)
Patient to return as needed. The patient is aware to call back for any questions or concerns. 

## 2017-02-26 NOTE — Progress Notes (Signed)
Patient ID: Christine Roberts, female   DOB: July 13, 1945, 71 y.o.   MRN: [id number]  Chief Complaint  Patient presents with  . Follow-up    HPI Christine Roberts is a 71 y.o. female here today for a recheck on right arm lipoma. Patient states no change in size.   No symptoms.   HPI  Past Medical History:  Diagnosis Date  . Diabetes mellitus without complication ([place])   . Hyperlipidemia   . Hypertension   . Kidney stone   . Left shoulder pain   . Obesity   . Osteoarthritis   . Ovarian failure   . Vitamin B12 deficiency (non anemic)     Past Surgical History:  Procedure Laterality Date  . ABDOMINAL HYSTERECTOMY    . CHOLECYSTECTOMY    . COLONOSCOPY  2015  . cystoscopic    . LITHOTRIPSY  12/2007  . MYRINGOTOMY Right 11/2009    Family History  Problem Relation Age of Onset  . Cancer Mother   . Diabetes Mother   . CAD Father   . Diabetes Daughter     Social History Social History  Substance Use Topics  . Smoking status: Never Smoker  . Smokeless tobacco: Never Used  . Alcohol use 0.6 oz/week    1 Shots of liquor per week    Allergies  Allergen Reactions  . Latex Rash  . Erythromycin Other (See Comments)    Strawberry tongue    Current Outpatient Prescriptions  Medication Sig Dispense Refill  . ACCU-CHEK AVIVA PLUS test strip USE AS DIRECTED 50 each 25  . aspirin EC 81 MG tablet Take 1 tablet (81 mg total) by mouth daily. 90 tablet 0  . atorvastatin (LIPITOR) 40 MG tablet TAKE 1 TABLET (40 MG TOTAL) BY MOUTH AT BEDTIME. 90 tablet 1  . Blood Glucose Monitoring Suppl (ACCU-CHEK AVIVA PLUS) w/Device KIT 1 Units by Does not apply route 2 (two) times daily. 1 kit 0  . calcium citrate (CALCITRATE - DOSED IN MG ELEMENTAL CALCIUM) 950 MG tablet Take 200 mg of elemental calcium by mouth daily.    . diclofenac sodium (VOLTAREN) 1 % GEL Apply 2 g topically 4 (four) times daily. 100 g 2  . doxycycline (MONODOX) 100 MG capsule Take 100 mg by mouth.    . fluticasone (FLONASE) 50  MCG/ACT nasal spray Place 2 sprays into both nostrils daily. 16 g 6  . Insulin Degludec (TRESIBA FLEXTOUCH) 200 UNIT/ML SOPN Inject 20-50 Units into the skin daily. 9 mL 5  . Insulin Pen Needle (NOVOFINE) 30G X 8 MM MISC Inject 10 each into the skin as needed. 100 each 2  . Lancet Devices MISC 1 Units by Does not apply route 2 (two) times daily. 100 each 3  . liraglutide (VICTOZA) 18 MG/3ML SOPN INJECT 1.8MG SUBCUTANEOUSLY DAILY 9 mL 5  . lisinopril (PRINIVIL,ZESTRIL) 20 MG tablet Take 1 tablet (20 mg total) by mouth daily. 90 tablet 1  . metFORMIN (GLUCOPHAGE) 1000 MG tablet TAKE 1 TABLET (1,000 MG TOTAL) BY MOUTH 2 (TWO) TIMES DAILY. 180 tablet 1  . metroNIDAZOLE (METROGEL) 1 % gel Apply topically.     No current facility-administered medications for this visit.     Review of Systems Review of Systems  Constitutional: Negative.   Respiratory: Negative.   Cardiovascular: Negative.     Blood pressure 118/68, pulse 78, resp. rate 12, height 5' 3"  (1.6 m), weight 179 lb (81.2 kg).  Physical Exam Physical Exam  Constitutional: She is oriented  to person, place, and time. She appears well-developed and well-nourished.  Neurological: She is alert and oriented to person, place, and time.  Skin: Skin is warm and dry.       Data Reviewed Prior notes reviewed  Assessment      Benign stable lipoma right upper arm unchanged over a year in size and remains asymptomatic  Plan    Patient to return as needed. The patient is aware to call back for any questions or concerns.  HPI, Physical Exam, Assessment and Plan have been scribed under the direction and in the presence of Mckinley Jewel, MD  Gaspar Cola, CMA    I have completed the exam and reviewed the above documentation for accuracy and completeness.  I agree with the above.  Haematologist has been used and any errors in dictation or transcription are unintentional.  Seeplaputhur G. Jamal Collin, M.D.,  F.A.C.S.     Junie Panning G 02/26/2017, 3:14 PM

## 2017-03-06 ENCOUNTER — Other Ambulatory Visit: Payer: Self-pay | Admitting: Family Medicine

## 2017-03-06 DIAGNOSIS — D481 Neoplasm of uncertain behavior of connective and other soft tissue: Secondary | ICD-10-CM | POA: Diagnosis not present

## 2017-03-06 DIAGNOSIS — L570 Actinic keratosis: Secondary | ICD-10-CM | POA: Diagnosis not present

## 2017-03-06 DIAGNOSIS — L820 Inflamed seborrheic keratosis: Secondary | ICD-10-CM | POA: Diagnosis not present

## 2017-03-06 NOTE — Telephone Encounter (Signed)
Patient requesting refill of Victoza to [facility].

## 2017-06-05 ENCOUNTER — Telehealth: Payer: Self-pay | Admitting: Family Medicine

## 2017-06-05 NOTE — Telephone Encounter (Signed)
Copied from [place] [id number]. Topic: Quick Communication - Rx Refill/Question >> Jun 05, 2017  3:35 PM Marin Olp L wrote: Has the patient contacted their pharmacy? Yes.   (Agent: If no, request that the patient contact the pharmacy for the refill.) Preferred Pharmacy (with phone number or street name): [place], [place] Agent: Please be advised that RX refills may take up to 48 hours. We ask that you follow-up with your pharmacy. Lisinopril and metformin 90 day supply.

## 2017-06-05 NOTE — Telephone Encounter (Signed)
Copied from [place]. Topic: General - Other >> Jun 05, 2017  3:38 PM Marin Olp L wrote: Reason for CRM: Patient wants to know if there are Victoza samples at the office.

## 2017-06-06 ENCOUNTER — Other Ambulatory Visit: Payer: Self-pay | Admitting: *Deleted

## 2017-06-06 DIAGNOSIS — I10 Essential (primary) hypertension: Secondary | ICD-10-CM

## 2017-06-06 DIAGNOSIS — E1121 Type 2 diabetes mellitus with diabetic nephropathy: Secondary | ICD-10-CM

## 2017-06-06 DIAGNOSIS — E1165 Type 2 diabetes mellitus with hyperglycemia: Secondary | ICD-10-CM

## 2017-06-06 DIAGNOSIS — IMO0002 Reserved for concepts with insufficient information to code with codable children: Secondary | ICD-10-CM

## 2017-06-06 MED ORDER — LISINOPRIL 20 MG PO TABS: 20.0000 mg | ORAL_TABLET | Freq: Every day | ORAL | 0 refills | Status: DC

## 2017-06-06 MED ORDER — METFORMIN HCL 1000 MG PO TABS: 1000.0000 mg | ORAL_TABLET | Freq: Two times a day (BID) | ORAL | 0 refills | Status: DC

## 2017-06-09 NOTE — Telephone Encounter (Signed)
Patient is aware and will come in today to pick up samples.

## 2017-07-08 ENCOUNTER — Other Ambulatory Visit: Payer: Self-pay | Admitting: Family Medicine

## 2017-07-08 DIAGNOSIS — E1121 Type 2 diabetes mellitus with diabetic nephropathy: Secondary | ICD-10-CM

## 2017-07-08 DIAGNOSIS — IMO0002 Reserved for concepts with insufficient information to code with codable children: Secondary | ICD-10-CM

## 2017-07-08 DIAGNOSIS — E1165 Type 2 diabetes mellitus with hyperglycemia: Principal | ICD-10-CM

## 2017-07-09 IMAGING — MR MR HEAD W/O CM
10 series · 48 of 48 positions shown · non-contrast
Comparison: CTA head and neck 0495 hours today.

CLINICAL DATA: 69-year-old female with sudden onset headache,
confusion, loss of speech at 8588 hours. Symptoms have improved.
Initial encounter.

EXAM:
MRI HEAD WITHOUT CONTRAST
TECHNIQUE: Multiplanar, multiecho pulse sequences of the brain and surrounding
structures were obtained without intravenous contrast.

[Series 2: T1 · sagittal · 5.0mm · 0.45mm/px · 2 of 23 slices shown (1 of 2)]
[im 1/23]
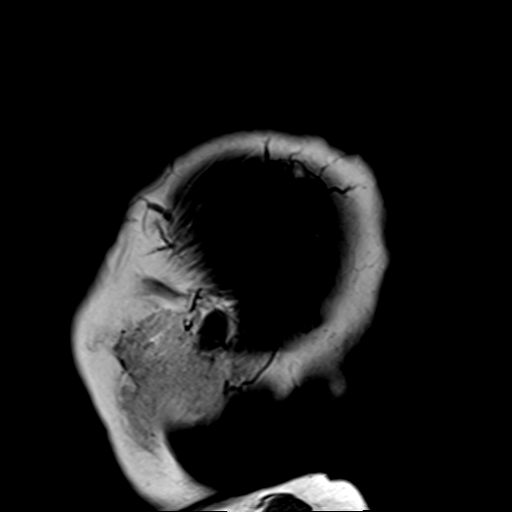
[im 23/23]
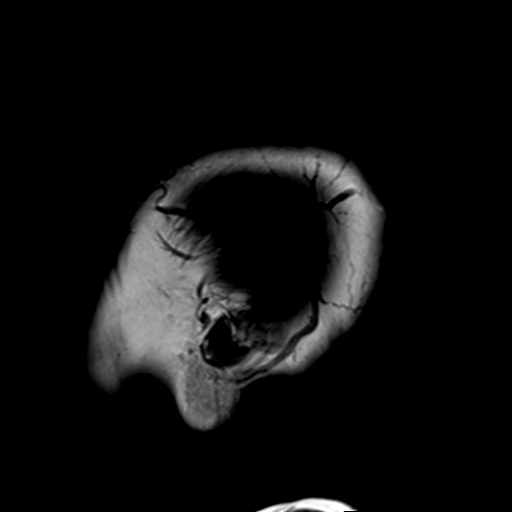

[Series 7: T2 · axial · 5.0mm · 0.60mm/px · z∈[-39,+111]mm · 3 of 25 slices shown (1 of 3)]
[im 1/25]
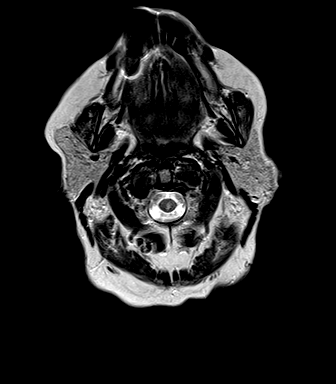
[im 13/25]
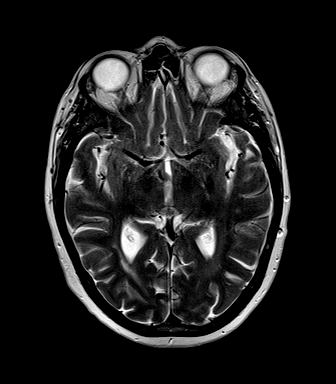
[im 25/25]
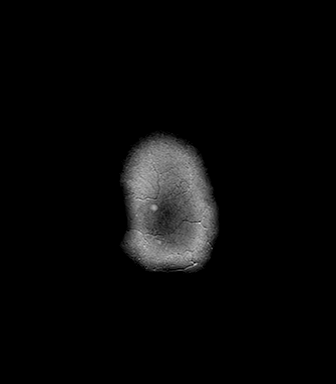

[Series 8: FLAIR · axial · 5.0mm · 0.45mm/px · z∈[-39,+111]mm · 3 of 25 slices shown]
[im 1/25]
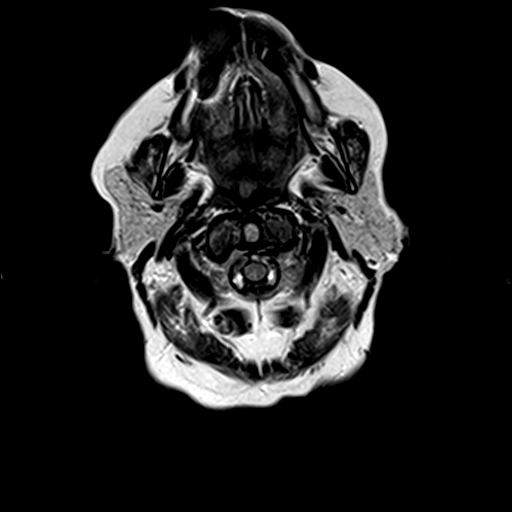
[im 13/25]
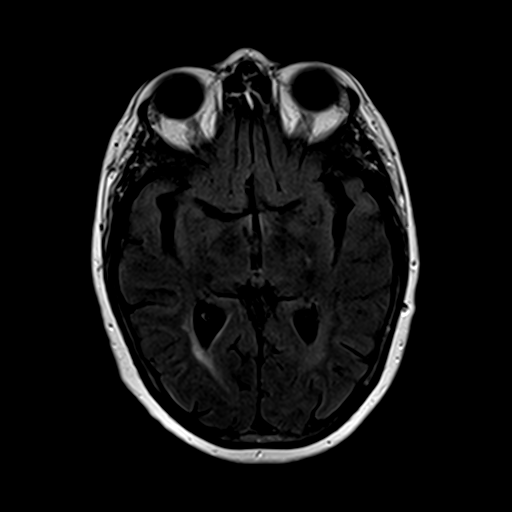
[im 25/25]
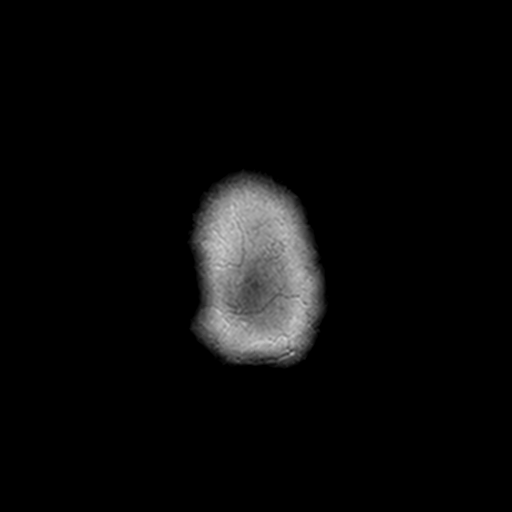

[Series 9: T2 · axial · 5.0mm · 0.45mm/px · z∈[-39,+111]mm · 3 of 25 slices shown (2 of 3)]
[im 1/25]
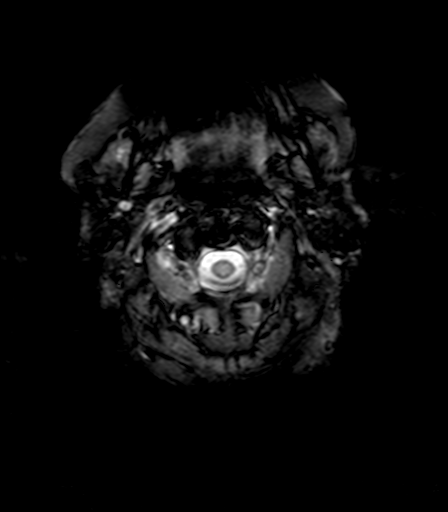
[im 13/25]
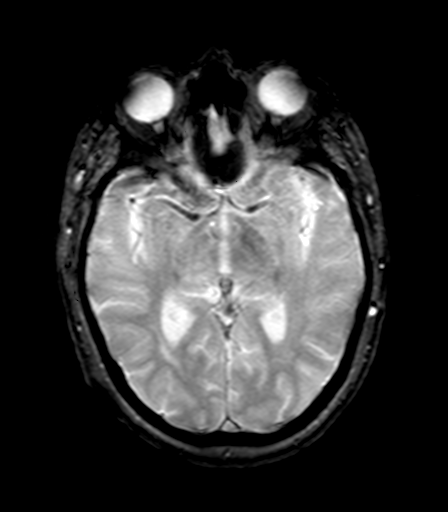
[im 25/25]
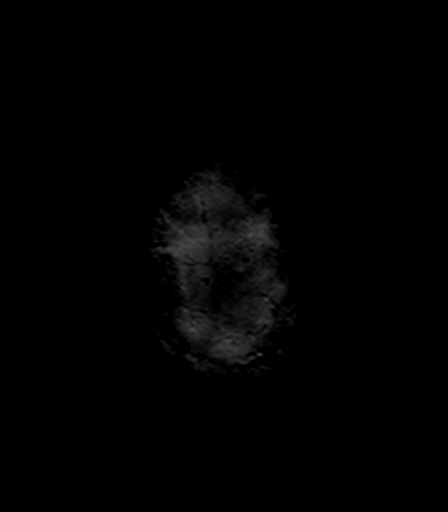

[Series 10: T1 · axial · 3.0mm · 1.00mm/px · z∈[-29,+118]mm · 7 of 52 slices shown (2 of 2)]
[im 1/52]
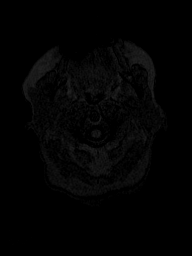
[im 9/52]
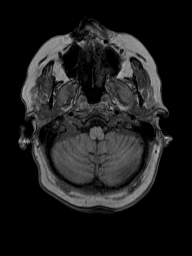
[im 18/52]
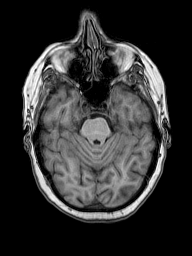
[im 26/52]
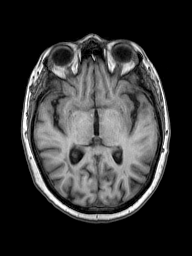
[im 35/52]
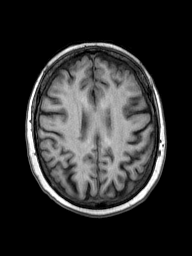
[im 43/52]
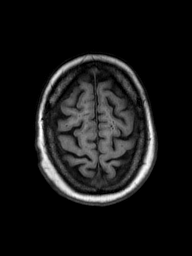
[im 52/52]
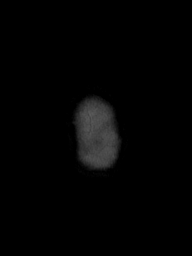

[Series 11: T2 · coronal · 5.0mm · 0.49mm/px · 4 of 29 slices shown (3 of 3)]
[im 1/29]
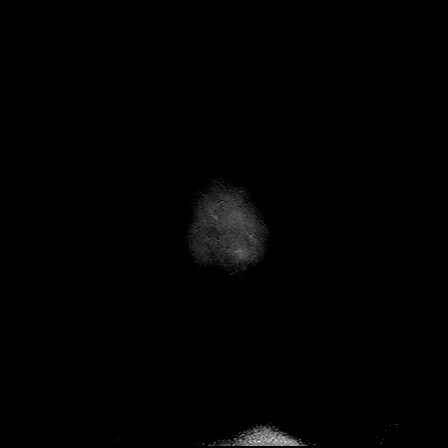
[im 10/29]
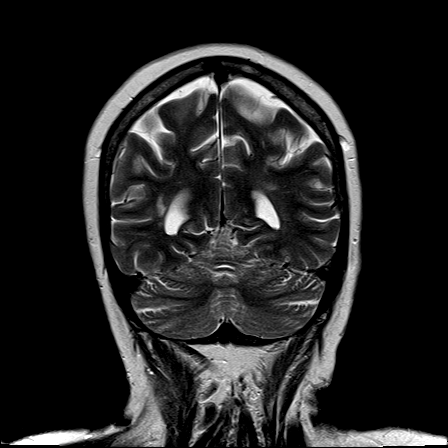
[im 19/29]
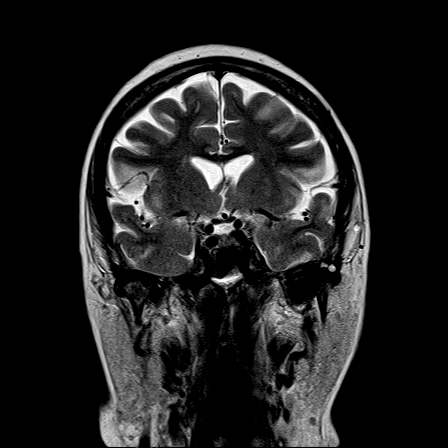
[im 29/29]
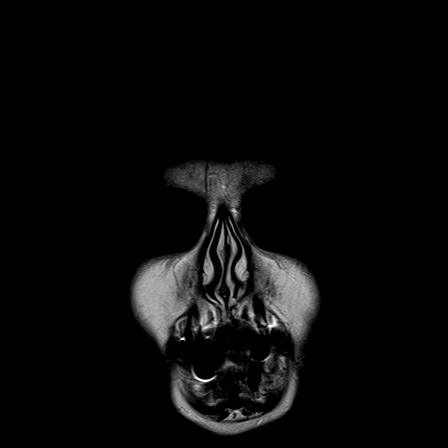

[Series 100: DWI · axial · 3.0mm · 1.80mm/px · z∈[-42,+114]mm · 7 of 55 slices shown (1 of 2)]
[im 1/55]
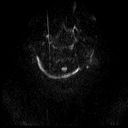
[im 10/55]
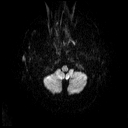
[im 19/55]
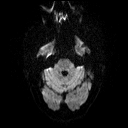
[im 28/55]
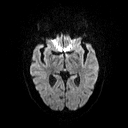
[im 37/55]
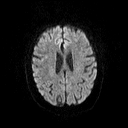
[im 46/55]
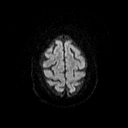
[im 55/55]
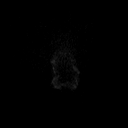

[Series 101: ADC · axial · 3.0mm · 1.80mm/px · z∈[-42,+114]mm · 7 of 54 slices shown (1 of 2)]
[im 1/54]
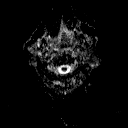
[im 9/54]
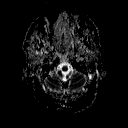
[im 18/54]
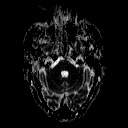
[im 27/54]
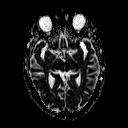
[im 36/54]
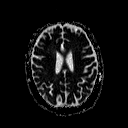
[im 45/54]
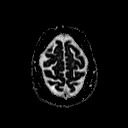
[im 54/54]
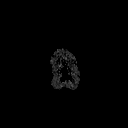

[Series 102: DWI · coronal · 3.0mm · 1.80mm/px · 6 of 45 slices shown (2 of 2)]
[im 1/45]
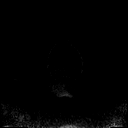
[im 9/45]
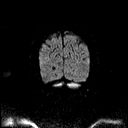
[im 18/45]
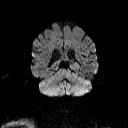
[im 27/45]
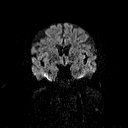
[im 36/45]
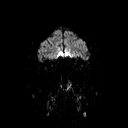
[im 45/45]
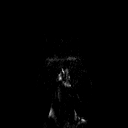

[Series 103: ADC · coronal · 3.0mm · 1.80mm/px · 6 of 45 slices shown (2 of 2)]
[im 1/45]
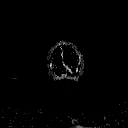
[im 9/45]
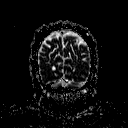
[im 18/45]
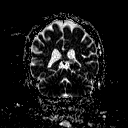
[im 27/45]
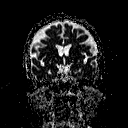
[im 36/45]
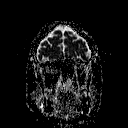
[im 45/45]
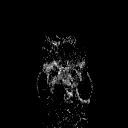

[48 of 48 positions shown; findings below may reference images not displayed]

FINDINGS: Cerebral volume is within normal limits for age. No restricted
diffusion to suggest acute infarction. No midline shift, mass
effect, evidence of mass lesion, ventriculomegaly, extra-axial
collection or acute intracranial hemorrhage. Cervicomedullary
junction and pituitary are within normal limits. Major intracranial
vascular flow voids are preserved.

Mild for age scattered cerebral white matter T2 and FLAIR
hyperintensity. No cortical encephalomalacia or chronic cerebral
blood products. Deep gray matter nuclei, brainstem, and cerebellum
are within normal limits.

Right mastoid effusion. Negative nasopharynx. Left mastoids are
clear. Mild paranasal sinus mucosal thickening. Negative orbit and
scalp soft tissues. Negative visualized cervical spine. Normal bone
marrow signal.
IMPRESSION: 1.  No acute intracranial abnormality.
2. Mild for age nonspecific cerebral white matter signal changes.
3. Right mastoid effusion.

## 2017-07-09 IMAGING — CT CT HEAD W/O CM
1 series · 16 of 29 positions shown, 20 images · non-contrast
Comparison: None ; correlation MRI brain 05/11/2010

CLINICAL DATA: Confusion since this morning, frontal headache,
tremor for 6 months worse in morning, code stroke, aphasia,
hypertension, diabetes mellitus

EXAM:
CT HEAD WITHOUT CONTRAST
TECHNIQUE: Contiguous axial images were obtained from the base of the skull
through the vertex without intravenous contrast.

[Series 2: soft tissue · axial · 0.39mm/px · z∈[+414,+544]mm · 16 of 29 slices shown, 20 images]
[im 2/29  brain]
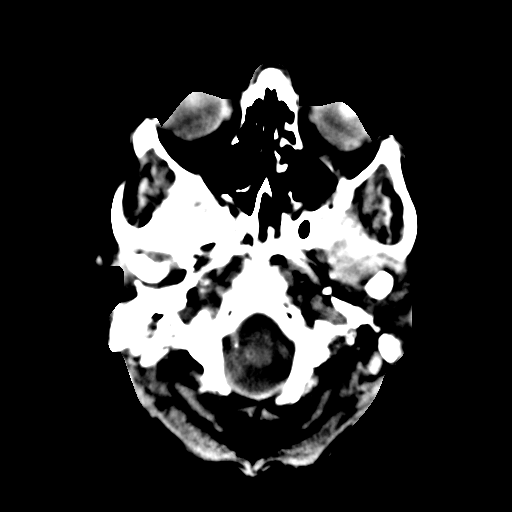
[im 2/29  bone]
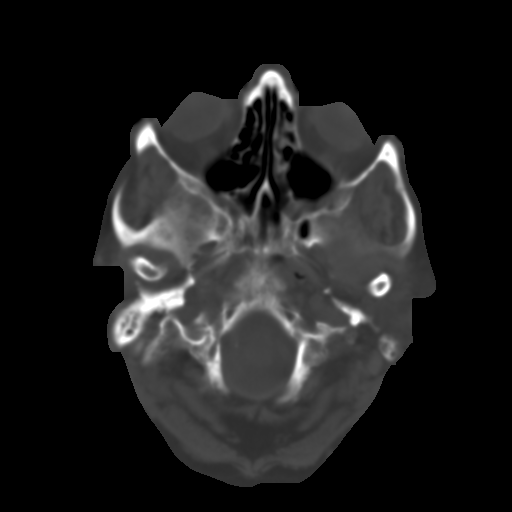
[im 4/29  brain]
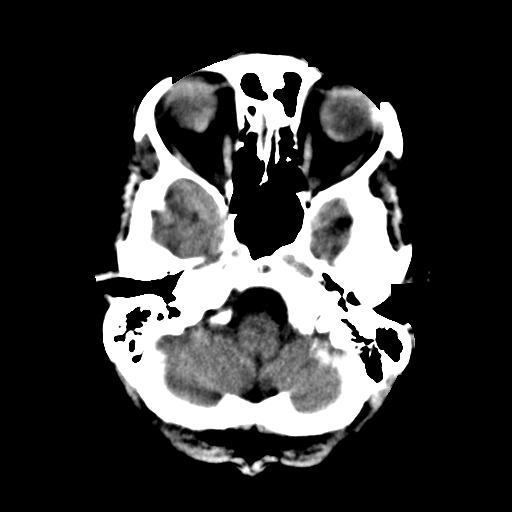
[im 6/29  brain]
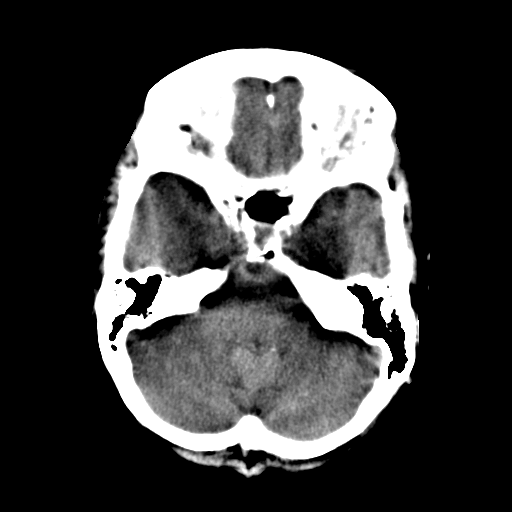
[im 7/29  brain]
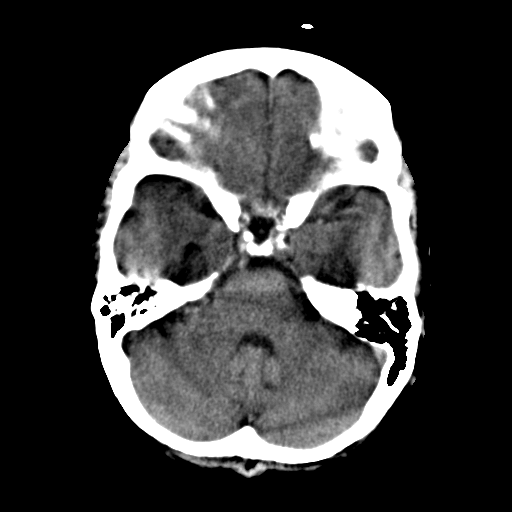
[im 9/29  brain]
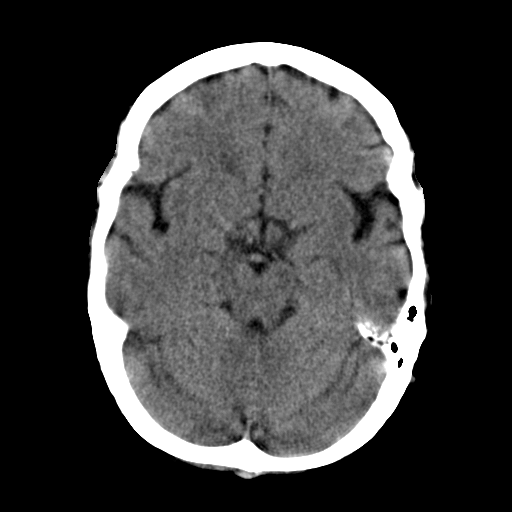
[im 9/29  bone]
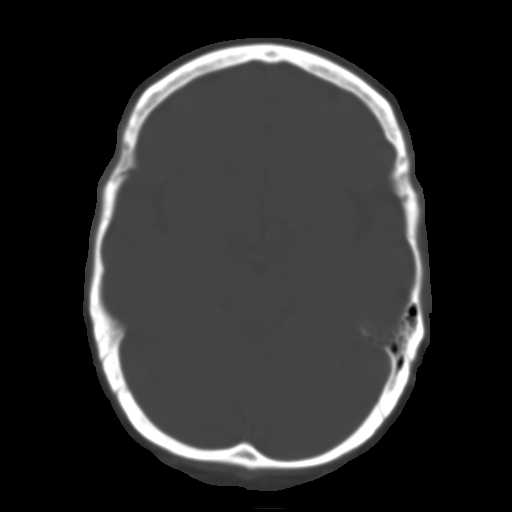
[im 11/29  brain]
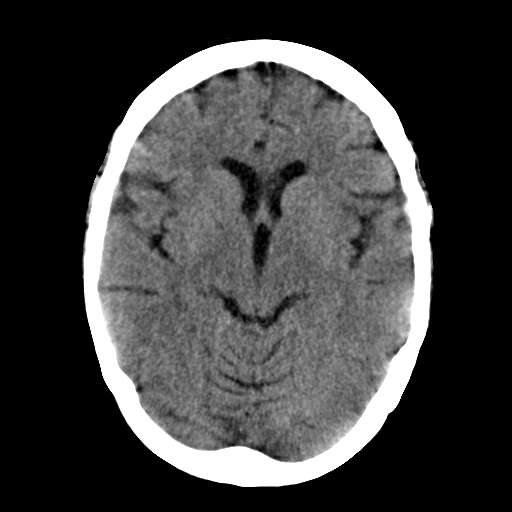
[im 12/29  brain]
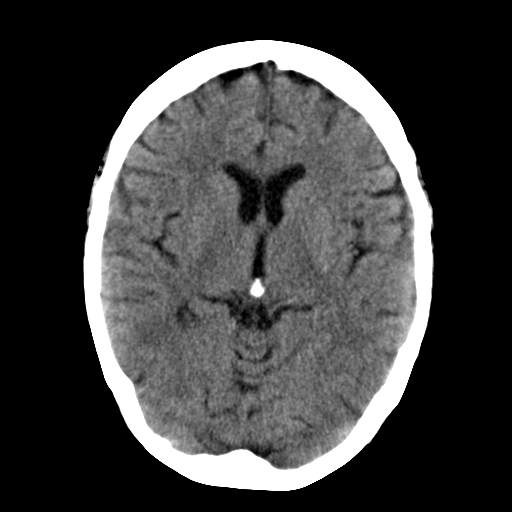
[im 14/29  brain]
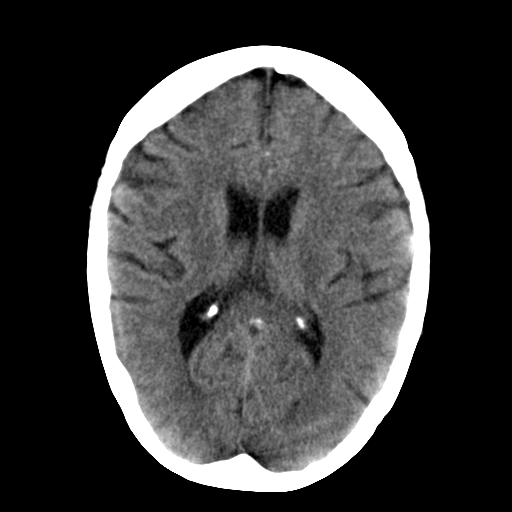
[im 16/29  brain]
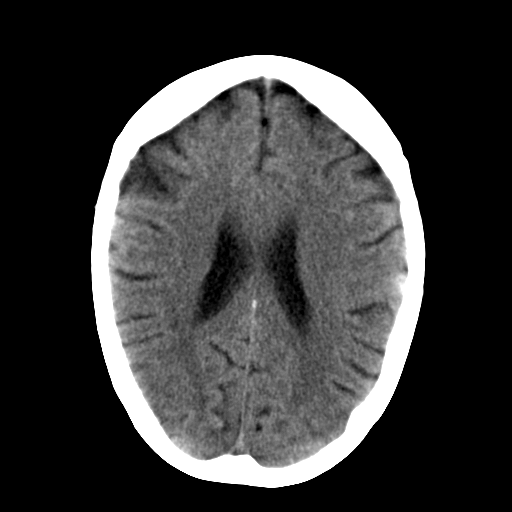
[im 16/29  bone]
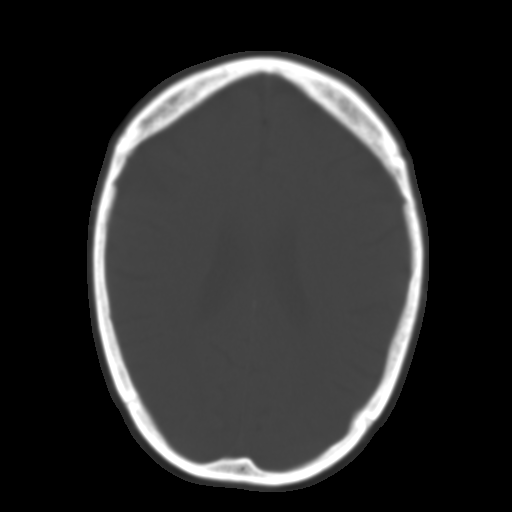
[im 18/29  brain]
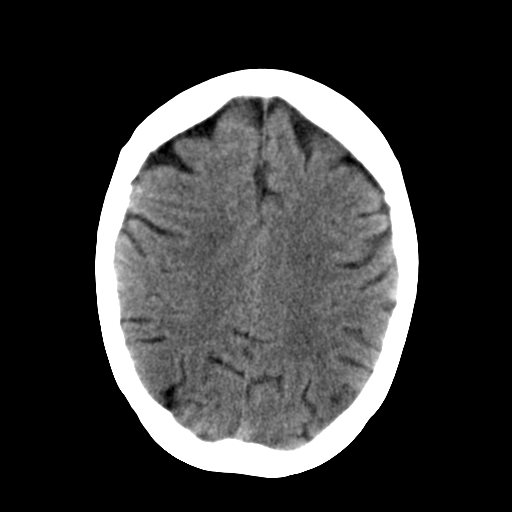
[im 19/29  brain]
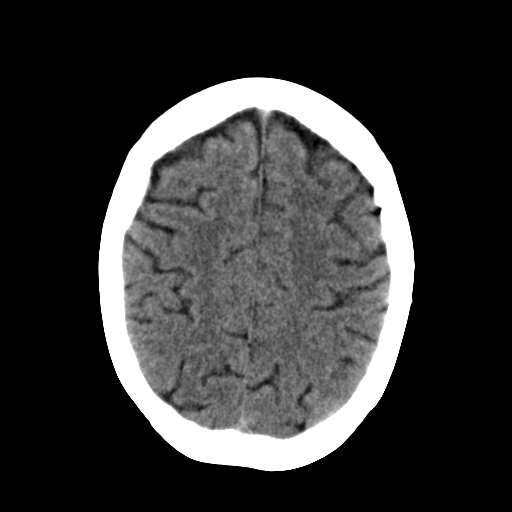
[im 21/29  brain]
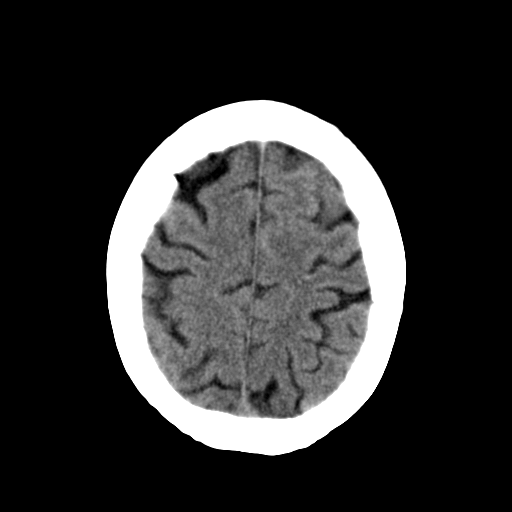
[im 23/29  brain]
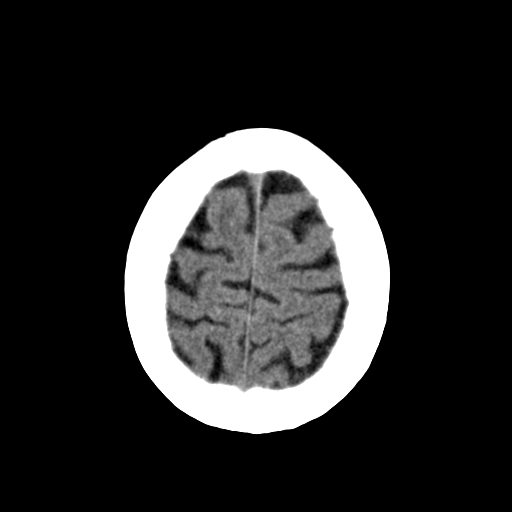
[im 23/29  bone]
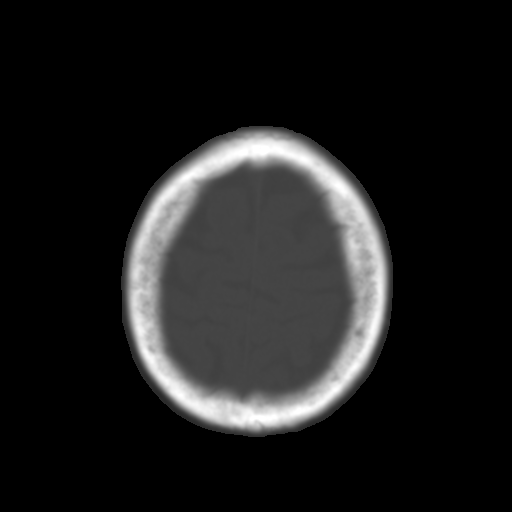
[im 24/29  brain]
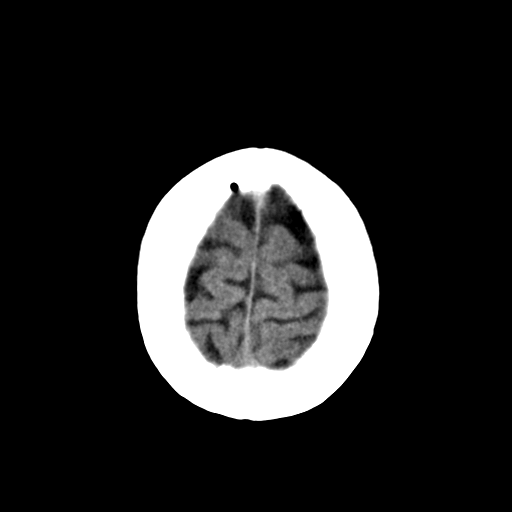
[im 26/29  brain]
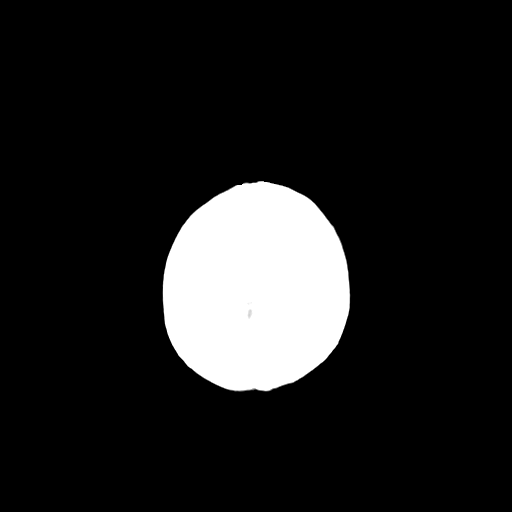
[im 28/29  brain]
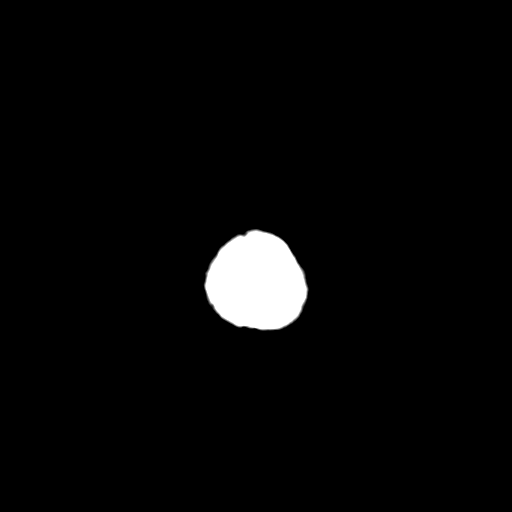

[16 of 29 positions shown; findings below may reference images not displayed]

FINDINGS: Mild atrophy.

Normal ventricular morphology.

No midline shift or mass effect.

Beam hardening artifacts from skullbase.

No definite intracranial hemorrhage, mass lesion, or evidence acute
infarction.

No extra-axial fluid collections.

Visualized paranasal sinuses and mastoid air cells clear.

Bones demineralized.
IMPRESSION: No acute intracranial abnormalities.

Findings called to Dr. Budic on 05/24/2015 at 1080 hours.

## 2017-07-14 ENCOUNTER — Encounter: Payer: Self-pay | Admitting: Family Medicine

## 2017-07-14 ENCOUNTER — Ambulatory Visit (INDEPENDENT_AMBULATORY_CARE_PROVIDER_SITE_OTHER): Payer: Medicare HMO | Admitting: Family Medicine

## 2017-07-14 VITALS — BP 138/86 | HR 84 | Temp 97.9°F | Resp 18 | Ht 63.0 in | Wt 188.8 lb

## 2017-07-14 DIAGNOSIS — I10 Essential (primary) hypertension: Secondary | ICD-10-CM | POA: Diagnosis not present

## 2017-07-14 DIAGNOSIS — E785 Hyperlipidemia, unspecified: Secondary | ICD-10-CM | POA: Diagnosis not present

## 2017-07-14 DIAGNOSIS — Z8673 Personal history of transient ischemic attack (TIA), and cerebral infarction without residual deficits: Secondary | ICD-10-CM

## 2017-07-14 DIAGNOSIS — E1165 Type 2 diabetes mellitus with hyperglycemia: Secondary | ICD-10-CM | POA: Diagnosis not present

## 2017-07-14 DIAGNOSIS — R251 Tremor, unspecified: Secondary | ICD-10-CM

## 2017-07-14 DIAGNOSIS — E1121 Type 2 diabetes mellitus with diabetic nephropathy: Secondary | ICD-10-CM

## 2017-07-14 DIAGNOSIS — E538 Deficiency of other specified B group vitamins: Secondary | ICD-10-CM

## 2017-07-14 DIAGNOSIS — IMO0002 Reserved for concepts with insufficient information to code with codable children: Secondary | ICD-10-CM

## 2017-07-14 DIAGNOSIS — D692 Other nonthrombocytopenic purpura: Secondary | ICD-10-CM | POA: Diagnosis not present

## 2017-07-14 DIAGNOSIS — K649 Unspecified hemorrhoids: Secondary | ICD-10-CM | POA: Diagnosis not present

## 2017-07-14 DIAGNOSIS — K5900 Constipation, unspecified: Secondary | ICD-10-CM

## 2017-07-14 LAB — POCT GLYCOSYLATED HEMOGLOBIN (HGB A1C): HEMOGLOBIN A1C: 7.6

## 2017-07-14 MED ORDER — METFORMIN HCL 1000 MG PO TABS: 1000.0000 mg | ORAL_TABLET | Freq: Two times a day (BID) | ORAL | 1 refills | Status: DC

## 2017-07-14 MED ORDER — LISINOPRIL 20 MG PO TABS: 20.0000 mg | ORAL_TABLET | Freq: Every day | ORAL | 1 refills | Status: DC

## 2017-07-14 MED ORDER — CYANOCOBALAMIN 1000 MCG/ML IJ SOLN
1000.0000 ug | Freq: Once | INTRAMUSCULAR | Status: AC
  Administered 2017-07-14: 1000 ug via INTRAMUSCULAR

## 2017-07-14 MED ORDER — HYDROCORTISONE ACETATE 25 MG RE SUPP: 25.0000 mg | Freq: Two times a day (BID) | RECTAL | 0 refills | Status: DC

## 2017-07-14 MED ORDER — INSULIN DEGLUDEC 200 UNIT/ML ~~LOC~~ SOPN: 50.0000 [IU] | PEN_INJECTOR | Freq: Every day | SUBCUTANEOUS | 0 refills | Status: DC

## 2017-07-14 MED ORDER — LIRAGLUTIDE 18 MG/3ML ~~LOC~~ SOPN
1.8000 mg | PEN_INJECTOR | SUBCUTANEOUS | 1 refills | Status: DC
Start: 2017-07-14 — End: 2017-07-16

## 2017-07-14 MED ORDER — POLYETHYLENE GLYCOL 3350 17 GM/SCOOP PO POWD: 17.0000 g | Freq: Two times a day (BID) | ORAL | 1 refills | Status: DC | PRN

## 2017-07-14 NOTE — Progress Notes (Signed)
Name: Christine Roberts   MRN: [id number]    DOB: 1945-12-31   Date:07/14/2017       Progress Note  Subjective  Chief Complaint  Chief Complaint  Patient presents with  . Medication Refill    6 month F/U  . Diabetes    Checks BS in the morning and working out 3x weekly Lowest-58 Average-100 Highest-170. Has been in the donut hole since July and unable to afford Victoza.  Marland Kitchen Hyperlipidemia  . Hypertension    Denies any symptoms  . Obesity    Has gained 9 pds since last visit  . Transient Ischemic Attack    HPI  DMII: she is on Metformin, [name] andVictoza.  She is on ace for microalbuminuria and denies side effects of medications. She exercises but is not eating healthy.. hgbA1C has gone up to 7.6%, out of Victoza for about 4 months and is taking more tresiba, noticing some hand tremors mid-mornings - occasionally,  advised to go down on [place] once she started Vctioza ( maybe scale back to 16 units and titrate up accordingly ), also take a snack for work or eat a bigger breakfast. She is on  Eye exam is up to date.  She denies polyphagia or polydipsia, she still has nocturia.   HTN - taking medication, no side effects, no chest pain or palpitation. BP has well controlled  Obesity: she has lost 31 lbs from  December 2016 to Dec 2017, gained 11  lbs since last visit, she has been out of Victoza, she reached the doughnut hole  Hyperlipidemia: taking lipitor a few times a week to avoid side effects, otherwise she has leg pains  TIA versus Complicated migraine: she developed dizziness, headache and aphasia on 11/16 - she was transported by EMS to [facility], she had normal Echo, CT, MRI of brain and carotid dopplers. She was seen by neurologist and advised to continue on aspirin and lipitor in case it was a TIA, but likely a complicated migraine. No other episodes since. She states only had migraine many years ago when on ocp. No recurrence of symptoms. No recurrence since initial  episode  Constipation and internal hemorrhoids: diagnosed years ago, has [place] 3-4 but small amounts and has to go multiple times daily, not emptying her bowels, she has pain with bowel movements and sometimes she bleeds. She has noticed soiling lately . She does not want to have colonoscopy now, she would like to be treated for the hemorrhoids first  Tremors: head when she needs to hold head still, see commend on A/P   Patient Active Problem List   Diagnosis Date Noted  . Seasonal allergic rhinitis 09/12/2015  . TIA (transient ischemic attack) 05/24/2015  . Senile purpura ([place]) 05/09/2015  . Benign essential HTN 03/18/2015  . Pain in shoulder 03/18/2015  . Dyslipidemia 03/18/2015  . History of surgery to major organs, presenting hazards to health 03/18/2015  . Hearing loss 03/18/2015  . Hiatal hernia 03/18/2015  . History of colon polyps 03/18/2015  . Calculus of kidney 03/18/2015  . B12 deficiency 03/18/2015  . Microalbuminuria 03/18/2015  . Adult BMI 30+ 03/18/2015  . Arthritis, degenerative 03/18/2015  . Diabetes mellitus with nephropathy ([place]) 03/18/2015    Past Surgical History:  Procedure Laterality Date  . ABDOMINAL HYSTERECTOMY    . CHOLECYSTECTOMY    . COLONOSCOPY  2015  . cystoscopic    . LITHOTRIPSY  12/2007  . MYRINGOTOMY Right 11/2009    Family History  Problem Relation  Age of Onset  . Cancer Mother   . Diabetes Mother   . CAD Father   . Diabetes Daughter     Social History   Socioeconomic History  . Marital status: Single    Spouse name: Not on file  . Number of children: Not on file  . Years of education: Not on file  . Highest education level: Not on file  Social Needs  . Financial resource strain: Not on file  . Food insecurity - worry: Not on file  . Food insecurity - inability: Not on file  . Transportation needs - medical: Not on file  . Transportation needs - non-medical: Not on file  Occupational History  . Not on file  Tobacco Use   . Smoking status: Never Smoker  . Smokeless tobacco: Never Used  Substance and Sexual Activity  . Alcohol use: Yes    Alcohol/week: 0.6 oz    Types: 1 Shots of liquor per week  . Drug use: No  . Sexual activity: Not Currently  Other Topics Concern  . Not on file  Social History Narrative  . Not on file     Current Outpatient Medications:  .  ACCU-CHEK AVIVA PLUS test strip, USE AS DIRECTED, Disp: 50 each, Rfl: 25 .  aspirin EC 81 MG tablet, Take 1 tablet (81 mg total) by mouth daily., Disp: 90 tablet, Rfl: 0 .  atorvastatin (LIPITOR) 40 MG tablet, TAKE 1 TABLET (40 MG TOTAL) BY MOUTH AT BEDTIME., Disp: 90 tablet, Rfl: 1 .  Blood Glucose Monitoring Suppl (ACCU-CHEK AVIVA PLUS) w/Device KIT, 1 Units by Does not apply route 2 (two) times daily., Disp: 1 kit, Rfl: 0 .  calcium citrate (CALCITRATE - DOSED IN MG ELEMENTAL CALCIUM) 950 MG tablet, Take 200 mg of elemental calcium by mouth daily., Disp: , Rfl:  .  fluticasone (FLONASE) 50 MCG/ACT nasal spray, Place 2 sprays into both nostrils daily., Disp: 16 g, Rfl: 6 .  Insulin Degludec (TRESIBA FLEXTOUCH) 200 UNIT/ML SOPN, Inject 50 Units into the skin daily., Disp: 27 mL, Rfl: 0 .  Insulin Pen Needle (NOVOFINE) 30G X 8 MM MISC, Inject 10 each into the skin as needed., Disp: 100 each, Rfl: 2 .  Lancet Devices MISC, 1 Units by Does not apply route 2 (two) times daily., Disp: 100 each, Rfl: 3 .  liraglutide (VICTOZA) 18 MG/3ML SOPN, Inject 0.3 mLs (1.8 mg total) into the skin once a week., Disp: 27 mL, Rfl: 1 .  lisinopril (PRINIVIL,ZESTRIL) 20 MG tablet, Take 1 tablet (20 mg total) by mouth daily., Disp: 90 tablet, Rfl: 1 .  metFORMIN (GLUCOPHAGE) 1000 MG tablet, Take 1 tablet (1,000 mg total) by mouth 2 (two) times daily., Disp: 180 tablet, Rfl: 1  Current Facility-Administered Medications:  .  cyanocobalamin ((VITAMIN B-12)) injection 1,000 mcg, 1,000 mcg, Intramuscular, Once, Steele Sizer, MD  Allergies  Allergen Reactions  . Latex  Rash  . Erythromycin Other (See Comments)    Strawberry tongue     ROS  Constitutional: Negative for fever, positive for  weight change.  Respiratory: Negative for cough and shortness of breath.   Cardiovascular: Negative for chest pain or palpitations.  Gastrointestinal: Negative for abdominal pain, no bowel changes.  Musculoskeletal: Negative for gait problem or joint swelling.  Skin: Negative for rash.  Neurological: Negative for dizziness or headache.  No other specific complaints in a complete review of systems (except as listed in HPI above).  Objective  Vitals:   07/14/17 1601  BP: 138/86  Pulse: 84  Resp: 18  Temp: 97.9 F (36.6 C)  TempSrc: Oral  SpO2: 96%  Weight: 188 lb 12.8 oz (85.6 kg)  Height: 5' 3" (1.6 m)    Body mass index is 33.44 kg/m.  Physical Exam  Constitutional: Patient appears well-developed and well-nourished. ObeseNo distress.  HEENT: head atraumatic, normocephalic, pupils equal and reactive to light, neck supple, throat within normal limits Cardiovascular: Normal rate, regular rhythm and normal heart sounds.  No murmur heard. No BLE edema. Pulmonary/Chest: Effort normal and breath sounds normal. No respiratory distress. Abdominal: Soft.  There is no tenderness. Psychiatric: Patient has a normal mood and affect. behavior is normal. Judgment and thought content normal.  Recent Results (from the past 2160 hour(s))  POCT HgB A1C     Status: None   Collection Time: 07/14/17  4:10 PM  Result Value Ref Range   Hemoglobin A1C 7.6     Diabetic Foot Exam: Diabetic Foot Exam - Simple   Simple Foot Form Diabetic Foot exam was performed with the following findings:  Yes 07/14/2017  4:46 PM  Visual Inspection No deformities, no ulcerations, no other skin breakdown bilaterally:  Yes Sensation Testing Intact to touch and monofilament testing bilaterally:  Yes Pulse Check Posterior Tibialis and Dorsalis pulse intact bilaterally:  Yes Comments       PHQ2/9: Depression screen [facility] 2/9 07/14/2017 01/10/2017 09/09/2016 05/10/2016 01/12/2016  Decreased Interest 0 0 0 0 0  Down, Depressed, Hopeless 0 0 0 0 0  PHQ - 2 Score 0 0 0 0 0     Fall Risk: Fall Risk  07/14/2017 01/10/2017 09/09/2016 05/10/2016 01/12/2016  Falls in the past year? _0   Number falls in past yr: - - - - -  Injury with Fall? - - - - -  Risk for fall due to : - - - - -  Follow up - - - - -    Functional Status Survey: Is the patient deaf or have difficulty hearing?: No Does the patient have difficulty seeing, even when wearing glasses/contacts?: No Does the patient have difficulty concentrating, remembering, or making decisions?: No Does the patient have difficulty walking or climbing stairs?: No Does the patient have difficulty dressing or bathing?: No Does the patient have difficulty doing errands alone such as visiting a doctor's office or shopping?: No    Assessment & Plan  1. Uncontrolled type 2 diabetes mellitus with nephropathy (HCC)  - POCT HgB A1C - metFORMIN (GLUCOPHAGE) 1000 MG tablet; Take 1 tablet (1,000 mg total) by mouth 2 (two) times daily.  Dispense: 180 tablet; Refill: 1 - liraglutide (VICTOZA) 18 MG/3ML SOPN; Inject 0.3 mLs (1.8 mg total) into the skin once a week.  Dispense: 27 mL; Refill: 1 - Insulin Degludec (TRESIBA FLEXTOUCH) 200 UNIT/ML SOPN; Inject 50 Units into the skin daily.  Dispense: 27 mL; Refill: 0  2. Senile purpura ([place])  Worse on her right hand  3. Benign essential HTN  - lisinopril (PRINIVIL,ZESTRIL) 20 MG tablet; Take 1 tablet (20 mg total) by mouth daily.  Dispense: 90 tablet; Refill: 1  4. B12 deficiency  - cyanocobalamin ((VITAMIN B-12)) injection 1,000 mcg  5. Dyslipidemia  Continue statin therapy   6. Hx-TIA (transient ischemic attack)  Not very compliant because it causes severe cramps  7. 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 No red flags, such as headaches, nausea, vomiting, double vision, hearing loss.   

## 2017-07-16 ENCOUNTER — Other Ambulatory Visit: Payer: Self-pay | Admitting: Family Medicine

## 2017-07-16 DIAGNOSIS — IMO0002 Reserved for concepts with insufficient information to code with codable children: Secondary | ICD-10-CM

## 2017-07-16 DIAGNOSIS — E1121 Type 2 diabetes mellitus with diabetic nephropathy: Secondary | ICD-10-CM

## 2017-07-16 DIAGNOSIS — E1165 Type 2 diabetes mellitus with hyperglycemia: Principal | ICD-10-CM

## 2017-07-16 MED ORDER — LIRAGLUTIDE 18 MG/3ML ~~LOC~~ SOPN: 1.8000 mg | PEN_INJECTOR | Freq: Every day | SUBCUTANEOUS | 1 refills | Status: DC

## 2017-07-18 ENCOUNTER — Ambulatory Visit (INDEPENDENT_AMBULATORY_CARE_PROVIDER_SITE_OTHER): Payer: Medicare HMO

## 2017-07-18 VITALS — BP 116/70 | HR 70 | Temp 97.5°F | Resp 12 | Ht 63.0 in | Wt 191.7 lb

## 2017-07-18 DIAGNOSIS — Z0000 Encounter for general adult medical examination without abnormal findings: Secondary | ICD-10-CM | POA: Diagnosis not present

## 2017-07-18 NOTE — Patient Instructions (Addendum)
Christine Roberts , Thank you for taking time to come for your Medicare Wellness Visit. I appreciate your ongoing commitment to your health goals. Please review the following plan we discussed and let me know if I can assist you in the future.   Screening recommendations/referrals: Colonoscopy: Completed 07/20/12. Repeat every 10 years Mammogram: Completed 01/14/17. Repeat every year Bone Density: Completed 07/08/09. Osteoporotic screenings no longer required. Lung: You do not qualify for this screening Hepatitis C Screening: Completed 03/30/13 HIV/Syphilis/Hepatitis B Screening: You do not qualify for this test.   Vision/Dental/Diabetic Exams: Diabetic Exams: Recommend annual diabetic eye exams for retinopathy and diabetic foot exams. Completed diabetic eye exam 11/26/16. Completed diabetic foot exam 07/14/17. Recommended yearly dental visit for hygiene and checkup  Vaccinations: Influenza vaccine: Up to date Pneumococcal vaccine: Completed series Tdap vaccine: Up to date Shingles vaccine: Declined. Please call your insurance company to determine your out of pocket expense. You may also receive this vaccine at your local pharmacy or Health Dept.  Advanced directives: Please bring a copy of your POA (Power of Attorney) and/or Living Will to your next appointment.   Conditions/risks identified: Recommend to drink at least 6-8 8oz glasses of water per day  Next appointment: Please schedule an appointment with Dr. Ancil Boozer within the next 30 days for follow up after today's Annual Wellness Visit.  Please schedule your Annual Wellness Visit with your Nurse Health Advisor in one year.  Preventive Care 22 Years and Older, Female Preventive care refers to lifestyle choices and visits with your health care provider that can promote health and wellness. What does preventive care include?  A yearly physical exam. This is also called an annual well check.  Dental exams once or twice a year.  Routine eye  exams. Ask your health care provider how often you should have your eyes checked.  Personal lifestyle choices, including:  Daily care of your teeth and gums.  Regular physical activity.  Eating a healthy diet.  Avoiding tobacco and drug use.  Limiting alcohol use.  Practicing safe sex.  Taking low-dose aspirin every day.  Taking vitamin and mineral supplements as recommended by your health care provider. What happens during an annual well check? The services and screenings done by your health care provider during your annual well check will depend on your age, overall health, lifestyle risk factors, and family history of disease. Counseling  Your health care provider may ask you questions about your:  Alcohol use.  Tobacco use.  Drug use.  Emotional well-being.  Home and relationship well-being.  Sexual activity.  Eating habits.  History of falls.  Memory and ability to understand (cognition).  Work and work Statistician.  Reproductive health. Screening  You may have the following tests or measurements:  Height, weight, and BMI.  Blood pressure.  Lipid and cholesterol levels. These may be checked every 5 years, or more frequently if you are over 19 years old.  Skin check.  Lung cancer screening. You may have this screening every year starting at age 73 if you have a 30-pack-year history of smoking and currently smoke or have quit within the past 15 years.  Fecal occult blood test (FOBT) of the stool. You may have this test every year starting at age 22.  Flexible sigmoidoscopy or colonoscopy. You may have a sigmoidoscopy every 5 years or a colonoscopy every 10 years starting at age 53.  Hepatitis C blood test.  Hepatitis B blood test.  Sexually transmitted disease (STD) testing.  Diabetes  screening. This is done by checking your blood sugar (glucose) after you have not eaten for a while (fasting). You may have this done every 1-3 years.  Bone  density scan. This is done to screen for osteoporosis. You may have this done starting at age 35.  Mammogram. This may be done every 1-2 years. Talk to your health care provider about how often you should have regular mammograms. Talk with your health care provider about your test results, treatment options, and if necessary, the need for more tests. Vaccines  Your health care provider may recommend certain vaccines, such as:  Influenza vaccine. This is recommended every year.  Tetanus, diphtheria, and acellular pertussis (Tdap, Td) vaccine. You may need a Td booster every 10 years.  Zoster vaccine. You may need this after age 83.  Pneumococcal 13-valent conjugate (PCV13) vaccine. One dose is recommended after age 75.  Pneumococcal polysaccharide (PPSV23) vaccine. One dose is recommended after age 67. Talk to your health care provider about which screenings and vaccines you need and how often you need them. This information is not intended to replace advice given to you by your health care provider. Make sure you discuss any questions you have with your health care provider. Document Released: 07/21/2015 Document Revised: 03/13/2016 Document Reviewed: 04/25/2015 Elsevier Interactive Patient Education  2017 [place] Prevention in the Home Falls can cause injuries. They can happen to people of all ages. There are many things you can do to make your home safe and to help prevent falls. What can I do on the outside of my home?  Regularly fix the edges of walkways and driveways and fix any cracks.  Remove anything that might make you trip as you walk through a door, such as a raised step or threshold.  Trim any bushes or trees on the path to your home.  Use bright outdoor lighting.  Clear any walking paths of anything that might make someone trip, such as rocks or tools.  Regularly check to see if handrails are loose or broken. Make sure that both sides of any steps have  handrails.  Any raised decks and porches should have guardrails on the edges.  Have any leaves, snow, or ice cleared regularly.  Use sand or salt on walking paths during winter.  Clean up any spills in your garage right away. This includes oil or grease spills. What can I do in the bathroom?  Use night lights.  Install grab bars by the toilet and in the tub and shower. Do not use towel bars as grab bars.  Use non-skid mats or decals in the tub or shower.  If you need to sit down in the shower, use a plastic, non-slip stool.  Keep the floor dry. Clean up any water that spills on the floor as soon as it happens.  Remove soap buildup in the tub or shower regularly.  Attach bath mats securely with double-sided non-slip rug tape.  Do not have throw rugs and other things on the floor that can make you trip. What can I do in the bedroom?  Use night lights.  Make sure that you have a light by your bed that is easy to reach.  Do not use any sheets or blankets that are too big for your bed. They should not hang down onto the floor.  Have a firm chair that has side arms. You can use this for support while you get dressed.  Do not have throw rugs  and other things on the floor that can make you trip. What can I do in the kitchen?  Clean up any spills right away.  Avoid walking on wet floors.  Keep items that you use a lot in easy-to-reach places.  If you need to reach something above you, use a strong step stool that has a grab bar.  Keep electrical cords out of the way.  Do not use floor polish or wax that makes floors slippery. If you must use wax, use non-skid floor wax.  Do not have throw rugs and other things on the floor that can make you trip. What can I do with my stairs?  Do not leave any items on the stairs.  Make sure that there are handrails on both sides of the stairs and use them. Fix handrails that are broken or loose. Make sure that handrails are as long as  the stairways.  Check any carpeting to make sure that it is firmly attached to the stairs. Fix any carpet that is loose or worn.  Avoid having throw rugs at the top or bottom of the stairs. If you do have throw rugs, attach them to the floor with carpet tape.  Make sure that you have a light switch at the top of the stairs and the bottom of the stairs. If you do not have them, ask someone to add them for you. What else can I do to help prevent falls?  Wear shoes that:  Do not have high heels.  Have rubber bottoms.  Are comfortable and fit you well.  Are closed at the toe. Do not wear sandals.  If you use a stepladder:  Make sure that it is fully opened. Do not climb a closed stepladder.  Make sure that both sides of the stepladder are locked into place.  Ask someone to hold it for you, if possible.  Clearly mark and make sure that you can see:  Any grab bars or handrails.  First and last steps.  Where the edge of each step is.  Use tools that help you move around (mobility aids) if they are needed. These include:  Canes.  Walkers.  Scooters.  Crutches.  Turn on the lights when you go into a dark area. Replace any light bulbs as soon as they burn out.  Set up your furniture so you have a clear path. Avoid moving your furniture around.  If any of your floors are uneven, fix them.  If there are any pets around you, be aware of where they are.  Review your medicines with your doctor. Some medicines can make you feel dizzy. This can increase your chance of falling. Ask your doctor what other things that you can do to help prevent falls. This information is not intended to replace advice given to you by your health care provider. Make sure you discuss any questions you have with your health care provider. Document Released: 04/20/2009 Document Revised: 11/30/2015 Document Reviewed: 07/29/2014 Elsevier Interactive Patient Education  2017 [employer].

## 2017-07-18 NOTE — Progress Notes (Signed)
Subjective:   Christine Roberts is a 72 y.o. female who presents for Medicare Annual (Subsequent) preventive examination.  Review of Systems:  N/A Cardiac Risk Factors include: advanced age (>57mn, >>65women);diabetes mellitus;dyslipidemia;hypertension;obesity (BMI >30kg/m2)     Objective:     Vitals: BP 116/70 (BP Location: Left Arm, Patient Position: Sitting, Cuff Size: Normal)   Pulse 70   Temp (!) 97.5 F (36.4 C) (Oral)   Resp 12   Ht _0  (1.6 m)   Wt 191 lb 11.2 oz (87 kg)   BMI 33.96 kg/m   Body mass index is 33.96 kg/m.  Advanced Directives 07/18/2017 01/10/2017 09/09/2016 05/10/2016 01/12/2016 09/12/2015 07/07/2015  Does Patient Have a Medical Advance Directive? Yes Yes Yes Yes No;Yes No Yes  Type of AParamedicof AVista CenterLiving will [place]woodLiving will HBothe[place] WestLiving will HSneadsLiving will Living will;Healthcare Power of Attorney - Living will;Healthcare Power of Attorney  Does patient want to make changes to medical advance directive? - - - No - Patient declined No - Patient declined - No - Patient declined  Copy of HFyffein Chart? No - copy requested No - copy requested - No - copy requested No - copy requested - -  Would patient like information on creating a medical advance directive? - - - - No - patient declined information - -   Pt has been provided with the "MOST" and "DNR" documents for her review. Pt has been advised that she will only need to complete the document that is most appropriate to suit her health care wishes. Once completed, pt has been advised to return the appropriate document to the office for the physician to review and sign. Verbalized acceptance and understanding.  Tobacco Social History   Tobacco Use  Smoking Status Never Smoker  Smokeless Tobacco Never Used  Tobacco Comment   smoking cessation materials not required     Counseling  given: No Comment: smoking cessation materials not required  Clinical Intake:  Pre-visit preparation completed: Yes  Pain : No/denies pain   BMI - recorded: 33.96 Nutritional Status: BMI > 30  Obese Nutritional Risks: None Has the patient had any N/V/D within the last 2 months?  No Does the patient have any non-healing wounds?  No Has the patient had any unintentional weight loss or weight gain?  No Is the patient diabetic?  Yes If diabetic, was a CBG obtained today?  No Did the patient bring in their glucometer from home?  No Comments: Pt checks CBG's at home. No issues or concerns voiced.  How often do you need to have someone help you when you read instructions, pamphlets, or other written materials from your doctor or pharmacy?: 1 - Never  Interpreter Needed?: No  Information entered by :: AIdell Pickles LPN  Past Medical History:  Diagnosis Date  . Diabetes mellitus without complication ([place]   . Hyperlipidemia   . Hypertension   . Kidney stone   . Left shoulder pain   . Obesity   . Osteoarthritis   . Ovarian failure   . Vitamin B12 deficiency (non anemic)    Past Surgical History:  Procedure Laterality Date  . ABDOMINAL HYSTERECTOMY    . CHOLECYSTECTOMY    . COLONOSCOPY  2015  . cystoscopic    . LITHOTRIPSY  12/2007  . MYRINGOTOMY Right 11/2009   Family History  Problem Relation Age of Onset  . Cancer Mother   .  Diabetes Mother   . Diabetes Daughter   . CAD Father   . Diabetes Daughter        hypoglycemia   Social History   Socioeconomic History  . Marital status: Widowed    Spouse name: None  . Number of children: 2  . Years of education: None  . Highest education level: Associate degree: occupational, Hotel manager, or vocational program  Social Needs  . Financial resource strain: Somewhat hard  . Food insecurity - worry: Never true  . Food insecurity - inability: Never true  . Transportation needs - medical: No  . Transportation needs -  non-medical: No  Occupational History    Employer: [employer]  Tobacco Use  . Smoking status: Never Smoker  . Smokeless tobacco: Never Used  . Tobacco comment: smoking cessation materials not required  Substance and Sexual Activity  . Alcohol use: Yes    Alcohol/week: 0.6 oz    Types: 1 Shots of liquor per week    Comment: socially  . Drug use: No  . Sexual activity: Not Currently  Other Topics Concern  . None  Social History Narrative  . None    Outpatient Encounter Medications as of 07/18/2017  Medication Sig  . ACCU-CHEK AVIVA PLUS test strip USE AS DIRECTED  . aspirin EC 81 MG tablet Take 1 tablet (81 mg total) by mouth daily.  Marland Kitchen atorvastatin (LIPITOR) 40 MG tablet TAKE 1 TABLET (40 MG TOTAL) BY MOUTH AT BEDTIME.  Marland Kitchen Blood Glucose Monitoring Suppl (ACCU-CHEK AVIVA PLUS) w/Device KIT 1 Units by Does not apply route 2 (two) times daily.  . calcium citrate (CALCITRATE - DOSED IN MG ELEMENTAL CALCIUM) 950 MG tablet Take 200 mg of elemental calcium by mouth daily.  . fluticasone (FLONASE) 50 MCG/ACT nasal spray Place 2 sprays into both nostrils daily.  . hydrocortisone (ANUSOL-HC) 25 MG suppository Place 1 suppository (25 mg total) rectally 2 (two) times daily.  . Insulin Degludec (TRESIBA FLEXTOUCH) 200 UNIT/ML SOPN Inject 50 Units into the skin daily.  . Insulin Pen Needle (NOVOFINE) 30G X 8 MM MISC Inject 10 each into the skin as needed.  Elmore Guise Devices MISC 1 Units by Does not apply route 2 (two) times daily.  Marland Kitchen liraglutide (VICTOZA) 18 MG/3ML SOPN Inject 0.3 mLs (1.8 mg total) into the skin daily.  Marland Kitchen lisinopril (PRINIVIL,ZESTRIL) 20 MG tablet Take 1 tablet (20 mg total) by mouth daily.  . metFORMIN (GLUCOPHAGE) 1000 MG tablet Take 1 tablet (1,000 mg total) by mouth 2 (two) times daily.  . polyethylene glycol powder (GLYCOLAX/MIRALAX) powder Take 17 g by mouth 2 (two) times daily as needed.   No facility-administered encounter medications on file as of  07/18/2017.     Activities of Daily Living In your present state of health, do you have any difficulty performing the following activities: 07/18/2017 07/14/2017  Hearing? N N  Comment denies wearing hearing aids -  Vision? N N  Comment wears reading glasses -  Difficulty concentrating or making decisions? N N  Walking or climbing stairs? Y N  Comment fatigue -  Dressing or bathing? N N  Doing errands, shopping? N N  Preparing Food and eating ? N -  Comment partial dentures upper and lower -  Using the Toilet? N -  In the past six months, have you accidently leaked urine? N -  Do you have problems with loss of bowel control? N -  Managing your Medications? N -  Managing your Finances? N -  Housekeeping or managing your Housekeeping? N -  Some recent data might be hidden    Patient Care Team: Steele Sizer, MD as PCP - General (Family Medicine) Murrell Redden, MD as Consulting Physician (Urology) Estill Cotta, MD as Consulting Physician (Ophthalmology) Randa Spike, MD as Consulting Physician (Dermatology)    Assessment:   This is a routine wellness examination for [place].  Exercise Activities and Dietary recommendations Current Exercise Habits: Structured exercise class, Type of exercise: treadmill;walking;Other - see comments(eliptical), Time (Minutes): 60, Frequency (Times/Week): 3, Weekly Exercise (Minutes/Week): 180, Intensity: Mild, Exercise limited by: None identified  Goals    . DIET - INCREASE WATER INTAKE     Recommend to drink at least 6-8 8oz glasses of water per day        Fall Risk Fall Risk  07/18/2017 07/14/2017 01/10/2017 09/09/2016 05/10/2016  Falls in the past year? _0   Number falls in past yr: - - - - -  Injury with Fall? - - - - -  Risk for fall due to : - - - - -  Follow up - - - - -   Is the patient's home free of loose throw rugs in walkways, pet beds, electrical cords, etc?   yes      Grab bars in the bathroom? yes, Denies use of a  shower chair      Handrails on the stairs?   yes      Adequate lighting?   yes   Denies use of a walker, cane or w/c. Also denies use of an elevated toilet seat.  Timed Get Up and Go performed: Yes. 10 sec to ambulate 10 feet. Gait slow and steady. No intervention required.  Depression Screen PHQ 2/9 Scores 07/18/2017 07/18/2017 07/14/2017 01/10/2017  PHQ - 2 Score 0 0 0 0  PHQ- 9 Score 0 - - -     Cognitive Function     6CIT Screen 07/18/2017  What Year? 0 points  What month? 0 points  What time? 0 points  Count back from 20 0 points  Months in reverse 0 points  Repeat phrase 0 points  Total Score 0    Immunization History  Administered Date(s) Administered  . Influenza Split 04/11/2009  . Influenza, High Dose Seasonal PF 03/21/2015, 05/10/2016  . Influenza, Seasonal, Injecte, Preservative Fre 04/07/2012  . Influenza,inj,Quad PF,6+ Mos 03/07/2014  . Influenza-Unspecified 03/07/2014, 03/30/2017  . Pneumococcal Conjugate-13 03/21/2015  . Pneumococcal Polysaccharide-23 10/25/2009  . Tdap 10/25/2009  . Zoster 09/24/2011    Qualifies for Shingles Vaccine? Yes. Zostavax completed 09/24/11. Due for Shingrix vaccine. Education has been provided regarding the importance of this vaccine. Pt has been advised to call her insurance company to determine her out of pocket expense. Advised she may also receive this vaccine at her local pharmacy or Health Dept. Verbalized acceptance and understanding.  Screening Tests Health Maintenance  Topic Date Due  . OPHTHALMOLOGY EXAM  11/26/2017  . HEMOGLOBIN A1C  01/11/2018  . MAMMOGRAM  01/14/2018  . FOOT EXAM  07/14/2018  . TETANUS/TDAP  10/26/2019  . COLONOSCOPY  07/20/2022  . INFLUENZA VACCINE  Completed  . DEXA SCAN  Completed  . Hepatitis C Screening  Completed  . PNA vac Low Risk Adult  Completed   Completed diabetic eye exam 11/26/16. Reminded to schedule appt for repeat diabetic eye exam for some time in May/June 2019. Verbalized  acceptance and understanding.  Completed diabetic foot exam on 07/14/17.   Cancer  Screenings: Lung: Low Dose CT Chest recommended if Age 54-80 years, 30 pack-year currently smoking OR have quit w/in 15years. Patient does not qualify. NON-SMOKER Breast:  Up to date on Mammogram? Yes  Completed 01/14/17. Repeat every year. Up to date of Bone Density/Dexa? Yes  Completed 07/08/09. Osteoporotic screenings no longer required. Colorectal: Completed colonoscopy 07/20/12. Repeat every 10 years  Additional Screenings: Hepatitis B/HIV/Syphillis: does not qualify Hepatitis C Screening: Completed 03/30/13     Plan:    In preparation for this patient's Medicare Annual Wellness visit, I have personally reviewed the following information in the patient's chart:  A. Medical and Surgical History B. Office visits, Hospitalizations and ER visits within the last 12 months C. Radiology, Laboratory and Pathological Reports (including those records in [facility]) D. Health Maintenance for accuracy and any attached, scanned or relevant reports or letters E.  Immunization History F.  Upcoming referrals and appointments G. Advance directives and Code Status  I have personally addressed the Medicare Wellness Questionnaire with the patient and have noted and/or up dated the following information:  A.  Current medications (including OTC medications) B. Medication Allergies C. Medical and Surgical History D.  Physicians on the patient's care team [place] Physical activity G. Functional ability and status H. Nutritional status I. Risk for fall and preventative measures (including TUG test) J. Use of alcohol, tobacco or illicit drugs  K.          Screenings such as hearing, vision, cognitive and depression L. Advance Directives and Code Status M. Realistic Patient Goals N. Financial and Social strains, if any  In addition, I have reviewed and discussed with the patient, certain preventive protocols,  quality metrics, and best practice recommendations. To ensure quality care and proper  continuity of care, any concerns that arose during the Medicare Wellness visit were addressed with the patient's physician. A written personalized care plan for preventive services as well as general preventive health recommendations were provided to patient.  Signed,  Aleatha Borer, LPN  I have reviewed this encounter including the documentation in this note and/or discussed this patient with the provider, Aleatha Borer, LPN. I am certifying that I agree with the content of this note as supervising physician.  Steele Sizer, MD [facility] 07/18/2017, 4:56 PM

## 2017-07-23 ENCOUNTER — Telehealth: Payer: Self-pay | Admitting: Family Medicine

## 2017-07-23 NOTE — Telephone Encounter (Signed)
Unfortunately I cannot call in antibiotics without seeing her. She can try saline spray, otc medication

## 2017-07-23 NOTE — Telephone Encounter (Signed)
Copied from [place] [id number]. Topic: Quick Communication - See Telephone Encounter >> Jul 23, 2017  8:50 AM Ahmed Prima L wrote: CRM for notification. See Telephone encounter for:   07/23/17.  Patient said she has sinus infection. She wants to know if Dr Ancil Boozer can call her in an antibiotic. Call back is [phone]. [facility] on [address]

## 2017-07-23 NOTE — Telephone Encounter (Signed)
Informed patient unable to give antibiotics over the phone and to try otc nasal sprays or a Neti-Pot. Patient states she was just here twice last week once for a CPE and once for a medication refill. Patient is having green chucks coming from her nasal canal and a low grade fever and states Dr. Ancil Boozer has given her Amoxicillin in the past with out seeing her.

## 2017-08-11 ENCOUNTER — Other Ambulatory Visit: Payer: Self-pay | Admitting: Family Medicine

## 2017-08-11 DIAGNOSIS — IMO0002 Reserved for concepts with insufficient information to code with codable children: Secondary | ICD-10-CM

## 2017-08-11 DIAGNOSIS — E1165 Type 2 diabetes mellitus with hyperglycemia: Secondary | ICD-10-CM

## 2017-08-11 DIAGNOSIS — I10 Essential (primary) hypertension: Secondary | ICD-10-CM

## 2017-08-11 DIAGNOSIS — E1121 Type 2 diabetes mellitus with diabetic nephropathy: Secondary | ICD-10-CM

## 2017-08-21 ENCOUNTER — Telehealth: Payer: Self-pay | Admitting: Family Medicine

## 2017-08-21 NOTE — Telephone Encounter (Signed)
Copied from CRM #[id number]. Topic: Quick Communication - Rx Refill/Question °>> Aug 21, 2017 10:47 AM [place] Medina, Teresa D wrote: °Medication:  Blood Glucose Monitoring Suppl (ACCU-CHEK AVIVA PLUS) w/Device KIT  ° ° °Has the patient contacted their pharmacy? No. Patient would like to talk to Dr. Sowles CMA. ° ° °(Agent: If no, request that the patient contact the pharmacy for the refill.) ° ° °Preferred Pharmacy (with phone number or street name): [id number] - [place], [place] - [address], still not sure which pharmacy to go. Need to talk to CMA before sending. ° ° °Agent: Please be advised that RX refills may take up to 3 business days. We ask that you follow-up with your pharmacy. °

## 2017-08-21 NOTE — Telephone Encounter (Signed)
Patient has questions about Glucose monitoring kit for office.

## 2017-08-22 ENCOUNTER — Other Ambulatory Visit: Payer: Self-pay

## 2017-08-22 MED ORDER — BLOOD GLUCOSE MONITOR KIT: PACK | 0 refills | Status: DC

## 2017-08-22 NOTE — Telephone Encounter (Signed)
Refill request for diabetic medication:   Glucose meter kit  Last office visit pertaining to diabetes: 07/14/2017   Lab Results  Component Value Date   HGBA1C 7.6 07/14/2017    Follow-up on file. 11/17/2017

## 2017-08-22 NOTE — Telephone Encounter (Signed)
Sent request to PCP

## 2017-11-10 ENCOUNTER — Other Ambulatory Visit: Payer: Self-pay | Admitting: Family Medicine

## 2017-11-10 DIAGNOSIS — E1121 Type 2 diabetes mellitus with diabetic nephropathy: Secondary | ICD-10-CM

## 2017-11-10 DIAGNOSIS — E1165 Type 2 diabetes mellitus with hyperglycemia: Principal | ICD-10-CM

## 2017-11-10 DIAGNOSIS — IMO0002 Reserved for concepts with insufficient information to code with codable children: Secondary | ICD-10-CM

## 2017-11-10 NOTE — Telephone Encounter (Signed)
Copied from [place] [id number]. Topic: Quick Communication - Rx Refill/Question >> Nov 10, 2017  2:08 PM Boyd Kerbs wrote: Medication:   Insulin Degludec (TRESIBA FLEXTOUCH) 200 UNIT/ML SOPN  Pt. Is running low and needs just one box called in and then rest called into [employer]  Has the patient contacted their pharmacy? No. (Agent: If no, request that the patient contact the pharmacy for the refill.) Preferred Pharmacy (with phone number or street name):   [facility] [address] [place] - [place] [address] Phone: [phone] Fax: [phone]   Agent: Please be advised that RX refills may take up to 3 business days. We ask that you follow-up with your pharmacy.

## 2017-11-11 MED ORDER — INSULIN DEGLUDEC 200 UNIT/ML ~~LOC~~ SOPN: 50.0000 [IU] | PEN_INJECTOR | Freq: Every day | SUBCUTANEOUS | 0 refills | Status: DC

## 2017-11-11 NOTE — Telephone Encounter (Signed)
LOV  07/14/17 Dr. Ancil Boozer

## 2017-11-11 NOTE — Telephone Encounter (Signed)
Refill request for diabetic medication:   Christine Roberts  Last office visit pertaining to diabetes: 07/14/2017   Lab Results  Component Value Date   HGBA1C 7.6 07/14/2017    Follow-ups on file. 11/17/2017

## 2017-11-12 ENCOUNTER — Telehealth: Payer: Self-pay | Admitting: Family Medicine

## 2017-11-12 NOTE — Telephone Encounter (Signed)
We will need to change medication if she is in the donut hole. Can she bring a copy of book from [place].

## 2017-11-12 NOTE — Telephone Encounter (Signed)
Copied from [place]. Topic: Quick Communication - Rx Refill/Question >> Nov 12, 2017  2:01 PM Christine Roberts wrote: Medication: Insulin Degludec (TRESIBA FLEXTOUCH) 200 UNIT/ML SOPN [[id number]]   Pt called to get a alternate medication or another resolution Roberts/c the medication is $391, pt is worried about what to do next, contact pt to advise

## 2017-11-13 NOTE — Telephone Encounter (Signed)
Patient is going to come in to get samples and call her insurance to find out cheaper options for her.

## 2017-11-17 ENCOUNTER — Encounter: Payer: Self-pay | Admitting: Family Medicine

## 2017-11-17 ENCOUNTER — Ambulatory Visit (INDEPENDENT_AMBULATORY_CARE_PROVIDER_SITE_OTHER): Payer: Medicare HMO | Admitting: Family Medicine

## 2017-11-17 VITALS — BP 100/70 | HR 74 | Resp 16 | Ht 63.0 in | Wt 186.2 lb

## 2017-11-17 DIAGNOSIS — I10 Essential (primary) hypertension: Secondary | ICD-10-CM | POA: Diagnosis not present

## 2017-11-17 DIAGNOSIS — Z8673 Personal history of transient ischemic attack (TIA), and cerebral infarction without residual deficits: Secondary | ICD-10-CM | POA: Diagnosis not present

## 2017-11-17 DIAGNOSIS — E785 Hyperlipidemia, unspecified: Secondary | ICD-10-CM | POA: Diagnosis not present

## 2017-11-17 DIAGNOSIS — E538 Deficiency of other specified B group vitamins: Secondary | ICD-10-CM | POA: Diagnosis not present

## 2017-11-17 DIAGNOSIS — M170 Bilateral primary osteoarthritis of knee: Secondary | ICD-10-CM

## 2017-11-17 DIAGNOSIS — E1121 Type 2 diabetes mellitus with diabetic nephropathy: Secondary | ICD-10-CM | POA: Diagnosis not present

## 2017-11-17 DIAGNOSIS — E1165 Type 2 diabetes mellitus with hyperglycemia: Secondary | ICD-10-CM | POA: Diagnosis not present

## 2017-11-17 DIAGNOSIS — M7042 Prepatellar bursitis, left knee: Secondary | ICD-10-CM

## 2017-11-17 DIAGNOSIS — K5900 Constipation, unspecified: Secondary | ICD-10-CM

## 2017-11-17 DIAGNOSIS — D692 Other nonthrombocytopenic purpura: Secondary | ICD-10-CM | POA: Diagnosis not present

## 2017-11-17 LAB — POCT GLYCOSYLATED HEMOGLOBIN (HGB A1C): Hemoglobin A1C: 7.2

## 2017-11-17 MED ORDER — MELOXICAM 15 MG PO TABS: 15.0000 mg | ORAL_TABLET | Freq: Every day | ORAL | 0 refills | Status: DC

## 2017-11-17 MED ORDER — POLYETHYLENE GLYCOL 3350 17 GM/SCOOP PO POWD: 17.0000 g | Freq: Two times a day (BID) | ORAL | 1 refills | Status: DC | PRN

## 2017-11-17 MED ORDER — LIRAGLUTIDE 18 MG/3ML ~~LOC~~ SOPN: 1.8000 mg | PEN_INJECTOR | Freq: Every day | SUBCUTANEOUS | 1 refills | Status: DC

## 2017-11-17 MED ORDER — METFORMIN HCL 1000 MG PO TABS: 1000.0000 mg | ORAL_TABLET | Freq: Two times a day (BID) | ORAL | 1 refills | Status: DC

## 2017-11-17 MED ORDER — INSULIN DEGLUDEC 200 UNIT/ML ~~LOC~~ SOPN: 50.0000 [IU] | PEN_INJECTOR | Freq: Every day | SUBCUTANEOUS | 0 refills | Status: DC

## 2017-11-17 MED ORDER — LISINOPRIL 20 MG PO TABS: 20.0000 mg | ORAL_TABLET | Freq: Every day | ORAL | 1 refills | Status: DC

## 2017-11-17 NOTE — Progress Notes (Signed)
Name: Christine Roberts   MRN: [id number]    DOB: 1945-12-23   Date:11/17/2017       Progress Note  Subjective  Chief Complaint  Chief Complaint  Patient presents with  . Diabetes  . Hyperlipidemia  . Hypertension    HPI  DMII: she is on Metformin, Tresiba andVictoza.She is on ace for microalbuminuria and denies side effects of medications. She exercises but is not eating healthy.. hgbA1C has gone up to 7.6%, but down to 7.2% today. She has been going to the gym 3 times a week. She denies polyphagia, polyuria or polydipsia   HTN - taking medication, no side effects, no chest pain or palpitation. BP is towards low end of normal, she gets a little dizzy when she gets up quickly, but doing well otherwise. Discussed going down to 10 mg , she will try the week before her next visit, stay hydrated  Obesity: she has lost 31 lbsfromDecember 2016 to Dec 2017, gained 11  lbs in 2018, but is losing again, 6 lbs since 07/2017. She has been exercising   Hyperlipidemia: taking lipitor a few times a week to avoid side effects, otherwise she has leg pains  TIA versus Complicated migraine: she developed dizziness, headache and aphasia on 11/16 - she was transported by EMS to [facility], she had normal Echo, CT, MRI of brain and carotid dopplers. She was seen by neurologist and advised to continue on aspirin and lipitor in case it was a TIA, but likely a complicated migraine. No other episodes since. She states only had migraine many years ago when on ocp. No recurrence of symptoms. She sates she has been doing well.   Constipation and internal hemorrhoids: diagnosed years ago, has [place] 3-6 She used anusol HC during last hemorrhoids flare, doing better, no bleeding at this time  B12 deficiency: not currently taking B12 supplementation  Tremors: head when she needs to hold head still, stable   Senile purpura: stable on arms  OA knee with patellar bursitis: going on for the past month, swelling over  left knee, she is taking otc ostebiflex , we will give her melooxicam to take it for one week and after that prn. No redness or increase in warmth   Patient Active Problem List   Diagnosis Date Noted  . Seasonal allergic rhinitis 09/12/2015  . TIA (transient ischemic attack) 05/24/2015  . Senile purpura ([place]) 05/09/2015  . Benign essential HTN 03/18/2015  . Pain in shoulder 03/18/2015  . Dyslipidemia 03/18/2015  . History of surgery to major organs, presenting hazards to health 03/18/2015  . Hearing loss 03/18/2015  . Hiatal hernia 03/18/2015  . History of colon polyps 03/18/2015  . Calculus of kidney 03/18/2015  . B12 deficiency 03/18/2015  . Microalbuminuria 03/18/2015  . Adult BMI 30+ 03/18/2015  . Arthritis, degenerative 03/18/2015  . Diabetes mellitus with nephropathy ([place]) 03/18/2015    Past Surgical History:  Procedure Laterality Date  . ABDOMINAL HYSTERECTOMY    . CHOLECYSTECTOMY    . COLONOSCOPY  2015  . cystoscopic    . LITHOTRIPSY  12/2007  . MYRINGOTOMY Right 11/2009    Family History  Problem Relation Age of Onset  . Cancer Mother   . Diabetes Mother   . Diabetes Daughter   . CAD Father   . Diabetes Daughter        hypoglycemia    Social History   Socioeconomic History  . Marital status: Widowed    Spouse name: Not on file  .  Number of children: 2  . Years of education: Not on file  . Highest education level: Associate degree: occupational, Hotel manager, or vocational program  Occupational History    Employer: [employer]  Social Needs  . Financial resource strain: Somewhat hard  . Food insecurity:    Worry: Never true    Inability: Never true  . Transportation needs:    Medical: No    Non-medical: No  Tobacco Use  . Smoking status: Never Smoker  . Smokeless tobacco: Never Used  . Tobacco comment: smoking cessation materials not required  Substance and Sexual Activity  . Alcohol use: Yes    Alcohol/week: 0.6 oz    Types: 1  Shots of liquor per week    Comment: socially  . Drug use: No  . Sexual activity: Not Currently  Lifestyle  . Physical activity:    Days per week: 3 days    Minutes per session: 60 min  . Stress: Not at all  Relationships  . Social connections:    Talks on phone: Patient refused    Gets together: Patient refused    Attends religious service: Patient refused    Active member of club or organization: Patient refused    Attends meetings of clubs or organizations: Patient refused    Relationship status: Widowed  . Intimate partner violence:    Fear of current or ex partner: No    Emotionally abused: No    Physically abused: No    Forced sexual activity: No  Other Topics Concern  . Not on file  Social History Narrative  . Not on file     Current Outpatient Medications:  .  ACCU-CHEK AVIVA PLUS test strip, USE AS DIRECTED, Disp: 50 each, Rfl: 25 .  aspirin EC 81 MG tablet, Take 1 tablet (81 mg total) by mouth daily., Disp: 90 tablet, Rfl: 0 .  atorvastatin (LIPITOR) 40 MG tablet, TAKE 1 TABLET (40 MG TOTAL) BY MOUTH AT BEDTIME., Disp: 90 tablet, Rfl: 1 .  blood glucose meter kit and supplies KIT, Dispense based on patient and insurance preference. Use up to four times daily as directed. (FOR ICD-9 250.00, 250.01)., Disp: 1 each, Rfl: 0 .  calcium citrate (CALCITRATE - DOSED IN MG ELEMENTAL CALCIUM) 950 MG tablet, Take 200 mg of elemental calcium by mouth daily., Disp: , Rfl:  .  fluticasone (FLONASE) 50 MCG/ACT nasal spray, Place 2 sprays into both nostrils daily., Disp: 16 g, Rfl: 6 .  hydrocortisone (ANUSOL-HC) 25 MG suppository, Place 1 suppository (25 mg total) rectally 2 (two) times daily., Disp: 12 suppository, Rfl: 0 .  Insulin Degludec (TRESIBA FLEXTOUCH) 200 UNIT/ML SOPN, Inject 50 Units into the skin daily., Disp: 27 mL, Rfl: 0 .  Insulin Pen Needle (NOVOFINE) 30G X 8 MM MISC, Inject 10 each into the skin as needed., Disp: 100 each, Rfl: 2 .  Lancet Devices MISC, 1 Units by  Does not apply route 2 (two) times daily., Disp: 100 each, Rfl: 3 .  liraglutide (VICTOZA) 18 MG/3ML SOPN, Inject 0.3 mLs (1.8 mg total) into the skin daily., Disp: 27 mL, Rfl: 1 .  lisinopril (PRINIVIL,ZESTRIL) 20 MG tablet, TAKE 1 TABLET EVERY DAY, Disp: 90 tablet, Rfl: 0 .  metFORMIN (GLUCOPHAGE) 1000 MG tablet, TAKE 1 TABLET TWICE DAILY, Disp: 180 tablet, Rfl: 0 .  polyethylene glycol powder (GLYCOLAX/MIRALAX) powder, Take 17 g by mouth 2 (two) times daily as needed., Disp: 3350 g, Rfl: 1 .  ACCU-CHEK SOFTCLIX LANCETS lancets, 1  each by Other route daily., Disp: , Rfl:   Allergies  Allergen Reactions  . Latex Rash  . Erythromycin Other (See Comments)    Strawberry tongue     ROS  Constitutional: Negative for fever or significant weight change.  Respiratory: Negative for cough and shortness of breath.   Cardiovascular: Negative for chest pain or palpitations.  Gastrointestinal: Negative for abdominal pain, no bowel changes.  Musculoskeletal: Negative for gait problem or joint swelling.  Skin: Negative for rash.  Neurological: Negative for dizziness or headache.  No other specific complaints in a complete review of systems (except as listed in HPI above).  Objective  Vitals:   11/17/17 1320  BP: 100/70  Pulse: 74  Resp: 16  SpO2: 96%  Weight: 186 lb 3.2 oz (84.5 kg)  Height: _0  (1.6 m)    Body mass index is 32.98 kg/m.  Physical Exam  Constitutional: Patient appears well-developed and well-nourished. Obese No distress.  HEENT: head atraumatic, normocephalic, pupils equal and reactive to light,  neck supple, throat within normal limits Cardiovascular: Normal rate, regular rhythm and normal heart sounds.  No murmur heard. No BLE edema. Pulmonary/Chest: Effort normal and breath sounds normal. No respiratory distress. Abdominal: Soft.  There is no tenderness. Psychiatric: Patient has a normal mood and affect. behavior is normal. Judgment and thought content  normal. Muscular Skeletal: bursitis left knee, crepitus with extension of both knees.   Recent Results (from the past 2160 hour(s))  POCT HgB A1C     Status: Abnormal   Collection Time: 11/17/17  1:23 PM  Result Value Ref Range   Hemoglobin A1C 7.2      PHQ2/9: Depression screen [facility] 2/9 07/18/2017 07/18/2017 07/14/2017 01/10/2017 09/09/2016  Decreased Interest 0 0 0 0 0  Down, Depressed, Hopeless 0 0 0 0 0  PHQ - 2 Score 0 0 0 0 0  Altered sleeping 0 - - - -  Tired, decreased energy 0 - - - -  Change in appetite 0 - - - -  Feeling bad or failure about yourself  0 - - - -  Trouble concentrating 0 - - - -  Moving slowly or fidgety/restless 0 - - - -  Suicidal thoughts 0 - - - -  PHQ-9 Score 0 - - - -  Difficult doing work/chores Not difficult at all - - - -     Fall Risk: Fall Risk  11/17/2017 07/18/2017 07/14/2017 01/10/2017 09/09/2016  Falls in the past year? _1   Number falls in past yr: - - - - -  Injury with Fall? - - - - -  Risk for fall due to : - - - - -  Follow up - - - - -      Assessment & Plan  1. Controlled type 2 diabetes mellitus with nephropathy (HCC)  - POCT HgB A1C - metFORMIN (GLUCOPHAGE) 1000 MG tablet; Take 1 tablet (1,000 mg total) by mouth 2 (two) times daily.  Dispense: 180 tablet; Refill: 1 - lisinopril (PRINIVIL,ZESTRIL) 20 MG tablet; Take 1 tablet (20 mg total) by mouth daily.  Dispense: 90 tablet; Refill: 1 - try to go down to half pill and monitor bp at home  - liraglutide (VICTOZA) 18 MG/3ML SOPN; Inject 0.3 mLs (1.8 mg total) into the skin daily.  Dispense: 27 mL; Refill: 1 - Insulin Degludec (TRESIBA FLEXTOUCH) 200 UNIT/ML SOPN; Inject 50 Units into the skin daily.  Dispense: 27 mL; Refill: 0 - Lipid panel -  Urine Microalbumin w/creat. ratio   2. Constipation, unspecified constipation type  - polyethylene glycol powder (GLYCOLAX/MIRALAX) powder; Take 17 g by mouth 2 (two) times daily as needed.  Dispense: 3350 g; Refill: 1  3. Benign  essential HTN  - lisinopril (PRINIVIL,ZESTRIL) 20 MG tablet; Take 1 tablet (20 mg total) by mouth daily.  Dispense: 90 tablet; Refill: 1 - COMPLETE METABOLIC PANEL WITH GFR - CBC with Differential/Platelet  4. Senile purpura (HCC)  stable  5. B12 deficiency  - Vitamin B12  6. Dyslipidemia  - Lipid panel  7. Hx-TIA (transient ischemic attack)  - Lipid panel  8. Morbid obesity ([place])  Discussed with the patient the risk posed by an increased BMI. Discussed importance of portion control, calorie counting and at least 150 minutes of physical activity weekly. Avoid sweet beverages and drink more water. Eat at least 6 servings of fruit and vegetables daily   9. Prepatellar bursitis of left knee  Left knee - going on for the past month,   10. Primary osteoarthritis of both knees  Advised otc medication and topical medication   - meloxicam (MOBIC) 15 MG tablet; Take 1 tablet (15 mg total) by mouth daily.  Dispense: 30 tablet; Refill: 0

## 2017-11-18 LAB — CBC WITH DIFFERENTIAL/PLATELET
BASOS PCT: 0.4 %
Basophils Absolute: 39 cells/uL (ref 0–200)
Eosinophils Absolute: 353 cells/uL (ref 15–500)
Eosinophils Relative: 3.6 %
HEMATOCRIT: 36.2 % (ref 35.0–45.0)
Hemoglobin: 12.5 g/dL (ref 11.7–15.5)
LYMPHS ABS: 2744 {cells}/uL (ref 850–3900)
MCH: 28.5 pg (ref 27.0–33.0)
MCHC: 34.5 g/dL (ref 32.0–36.0)
MCV: 82.5 fL (ref 80.0–100.0)
MPV: 9.9 fL (ref 7.5–12.5)
Monocytes Relative: 6.7 %
NEUTROS PCT: 61.3 %
Neutro Abs: 6007 cells/uL (ref 1500–7800)
PLATELETS: 343 10*3/uL (ref 140–400)
RBC: 4.39 10*6/uL (ref 3.80–5.10)
RDW: 13.4 % (ref 11.0–15.0)
Total Lymphocyte: 28 %
WBC: 9.8 10*3/uL (ref 3.8–10.8)
WBCMIX: 657 {cells}/uL (ref 200–950)

## 2017-11-18 LAB — COMPLETE METABOLIC PANEL WITH GFR
AG RATIO: 1.4 (calc) (ref 1.0–2.5)
ALBUMIN MSPROF: 4.2 g/dL (ref 3.6–5.1)
ALT: 11 U/L (ref 6–29)
AST: 12 U/L (ref 10–35)
Alkaline phosphatase (APISO): 67 U/L (ref 33–130)
BILIRUBIN TOTAL: 0.4 mg/dL (ref 0.2–1.2)
BUN: 19 mg/dL (ref 7–25)
CALCIUM: 10 mg/dL (ref 8.6–10.4)
CHLORIDE: 103 mmol/L (ref 98–110)
CO2: 29 mmol/L (ref 20–32)
Creat: 0.77 mg/dL (ref 0.60–0.93)
GFR, EST AFRICAN AMERICAN: 89 mL/min/{1.73_m2} (ref >=60)
GFR, EST NON AFRICAN AMERICAN: 77 mL/min/{1.73_m2} (ref >=60)
GLOBULIN: 2.9 g/dL (ref 1.9–3.7)
Glucose, Bld: 84 mg/dL (ref 65–139)
POTASSIUM: 4.5 mmol/L (ref 3.5–5.3)
SODIUM: 140 mmol/L (ref 135–146)
TOTAL PROTEIN: 7.1 g/dL (ref 6.1–8.1)

## 2017-11-18 LAB — MICROALBUMIN / CREATININE URINE RATIO
CREATININE, URINE: 117 mg/dL (ref 20–275)
MICROALB UR: 0.4 mg/dL
Microalb Creat Ratio: 3 mcg/mg creat (ref <30)

## 2017-11-18 LAB — LIPID PANEL
Cholesterol: 181 mg/dL (ref <200)
HDL: 75 mg/dL (ref >50)
LDL Cholesterol (Calc): 85 mg/dL (calc)
NON-HDL CHOLESTEROL (CALC): 106 mg/dL (ref <130)
Total CHOL/HDL Ratio: 2.4 (calc) (ref <5.0)
Triglycerides: 116 mg/dL (ref <150)

## 2017-11-18 LAB — VITAMIN B12: VITAMIN B 12: 425 pg/mL (ref 200–1100)

## 2017-12-25 ENCOUNTER — Encounter: Payer: Self-pay | Admitting: Family Medicine

## 2017-12-25 ENCOUNTER — Ambulatory Visit (INDEPENDENT_AMBULATORY_CARE_PROVIDER_SITE_OTHER): Payer: Medicare HMO | Admitting: Family Medicine

## 2017-12-25 VITALS — BP 130/70 | HR 88 | Resp 16 | Ht 63.0 in | Wt 191.2 lb

## 2017-12-25 DIAGNOSIS — M5432 Sciatica, left side: Secondary | ICD-10-CM

## 2017-12-25 DIAGNOSIS — M7042 Prepatellar bursitis, left knee: Secondary | ICD-10-CM | POA: Diagnosis not present

## 2017-12-25 NOTE — Progress Notes (Signed)
Name: Christine Roberts   MRN: [id number]    DOB: [birth year]   Date:12/25/2017       Progress Note  Subjective  Chief Complaint  Chief Complaint  Patient presents with  . Follow-up  . Leg Pain    HPI  Left sciatica and follow up knee pain: last month she was having swelling and pain on left leg, she took meloxicam for one week and stopped because of side effects. She states she has been taking CBD oils since and seemed to have improved pain but swelling is the same. She states she now has noticed that the whole leg feels achy, worse on left outer hip, sometimes affects her gait. Worse after exercise or the end of the day. No tingling or numbness. No rashes. No falls or instability. No bowel or bladder incontinence    Patient Active Problem List   Diagnosis Date Noted  . Seasonal allergic rhinitis 09/12/2015  . TIA (transient ischemic attack) 05/24/2015  . Senile purpura ([place]) 05/09/2015  . Benign essential HTN 03/18/2015  . Pain in shoulder 03/18/2015  . Dyslipidemia 03/18/2015  . History of surgery to major organs, presenting hazards to health 03/18/2015  . Hearing loss 03/18/2015  . Hiatal hernia 03/18/2015  . History of colon polyps 03/18/2015  . Calculus of kidney 03/18/2015  . B12 deficiency 03/18/2015  . Microalbuminuria 03/18/2015  . Adult BMI 30+ 03/18/2015  . Arthritis, degenerative 03/18/2015  . Diabetes mellitus with nephropathy ([place]) 03/18/2015    Past Surgical History:  Procedure Laterality Date  . ABDOMINAL HYSTERECTOMY    . CHOLECYSTECTOMY    . COLONOSCOPY  2015  . cystoscopic    . LITHOTRIPSY  12/2007  . MYRINGOTOMY Right 11/2009    Family History  Problem Relation Age of Onset  . Cancer Mother   . Diabetes Mother   . Diabetes Daughter   . CAD Father   . Diabetes Daughter        hypoglycemia    Social History   Socioeconomic History  . Marital status: Widowed    Spouse name: Not on file  . Number of children: 2  . Years of education: Not on  file  . Highest education level: Associate degree: occupational, Hotel manager, or vocational program  Occupational History    Employer: [employer]  Social Needs  . Financial resource strain: Somewhat hard  . Food insecurity:    Worry: Never true    Inability: Never true  . Transportation needs:    Medical: No    Non-medical: No  Tobacco Use  . Smoking status: Never Smoker  . Smokeless tobacco: Never Used  . Tobacco comment: smoking cessation materials not required  Substance and Sexual Activity  . Alcohol use: Yes    Alcohol/week: 0.6 oz    Types: 1 Shots of liquor per week    Comment: socially  . Drug use: No  . Sexual activity: Not Currently  Lifestyle  . Physical activity:    Days per week: 3 days    Minutes per session: 60 min  . Stress: Not at all  Relationships  . Social connections:    Talks on phone: Patient refused    Gets together: Patient refused    Attends religious service: Patient refused    Active member of club or organization: Patient refused    Attends meetings of clubs or organizations: Patient refused    Relationship status: Widowed  . Intimate partner violence:    Fear of current or  ex partner: No    Emotionally abused: No    Physically abused: No    Forced sexual activity: No  Other Topics Concern  . Not on file  Social History Narrative  . Not on file     Current Outpatient Medications:  .  ACCU-CHEK AVIVA PLUS test strip, USE AS DIRECTED, Disp: 50 each, Rfl: 25 .  ACCU-CHEK SOFTCLIX LANCETS lancets, 1 each by Other route daily., Disp: , Rfl:  .  aspirin EC 81 MG tablet, Take 1 tablet (81 mg total) by mouth daily., Disp: 90 tablet, Rfl: 0 .  atorvastatin (LIPITOR) 40 MG tablet, TAKE 1 TABLET (40 MG TOTAL) BY MOUTH AT BEDTIME., Disp: 90 tablet, Rfl: 1 .  blood glucose meter kit and supplies KIT, Dispense based on patient and insurance preference. Use up to four times daily as directed. (FOR ICD-9 250.00, 250.01)., Disp: 1 each,  Rfl: 0 .  calcium citrate (CALCITRATE - DOSED IN MG ELEMENTAL CALCIUM) 950 MG tablet, Take 200 mg of elemental calcium by mouth daily., Disp: , Rfl:  .  fluticasone (FLONASE) 50 MCG/ACT nasal spray, Place 2 sprays into both nostrils daily., Disp: 16 g, Rfl: 6 .  hydrocortisone (ANUSOL-HC) 25 MG suppository, Place 1 suppository (25 mg total) rectally 2 (two) times daily., Disp: 12 suppository, Rfl: 0 .  Insulin Degludec (TRESIBA FLEXTOUCH) 200 UNIT/ML SOPN, Inject 50 Units into the skin daily., Disp: 27 mL, Rfl: 0 .  Insulin Pen Needle (NOVOFINE) 30G X 8 MM MISC, Inject 10 each into the skin as needed., Disp: 100 each, Rfl: 2 .  Lancet Devices MISC, 1 Units by Does not apply route 2 (two) times daily., Disp: 100 each, Rfl: 3 .  liraglutide (VICTOZA) 18 MG/3ML SOPN, Inject 0.3 mLs (1.8 mg total) into the skin daily., Disp: 27 mL, Rfl: 1 .  lisinopril (PRINIVIL,ZESTRIL) 20 MG tablet, Take 1 tablet (20 mg total) by mouth daily., Disp: 90 tablet, Rfl: 1 .  metFORMIN (GLUCOPHAGE) 1000 MG tablet, Take 1 tablet (1,000 mg total) by mouth 2 (two) times daily., Disp: 180 tablet, Rfl: 1 .  polyethylene glycol powder (GLYCOLAX/MIRALAX) powder, Take 17 g by mouth 2 (two) times daily as needed., Disp: 3350 g, Rfl: 1  Allergies  Allergen Reactions  . Latex Rash  . Erythromycin Other (See Comments)    Strawberry tongue     ROS  Ten systems reviewed and is negative except as mentioned in HPI   Objective  Vitals:   12/25/17 1058  BP: 130/70  Pulse: 88  Resp: 16  SpO2: 98%  Weight: 191 lb 3.2 oz (86.7 kg)  Height: _0  (1.6 m)    Body mass index is 33.87 kg/m.  Physical Exam  Constitutional: Patient appears well-developed and well-nourished. Obese  No distress.  HEENT: head atraumatic, normocephalic, pupils equal and reactive to light, neck supple, throat within normal limits Cardiovascular: Normal rate, regular rhythm and normal heart sounds.  No murmur heard. No BLE edema. Pulmonary/Chest:  Effort normal and breath sounds normal. No respiratory distress. Abdominal: Soft.  There is no tenderness. Muscular Skeletal: she still has swelling over left patella with a small nodule in the center. No redness or pain today. Pain during palpation of left trochanteric bursa, also with palpation of left lower back, but negative straight leg raise Psychiatric: Patient has a normal mood and affect. behavior is normal. Judgment and thought content normal.  Recent Results (from the past 2160 hour(s))  POCT HgB A1C  Status: Abnormal   Collection Time: 11/17/17  1:23 PM  Result Value Ref Range   Hemoglobin A1C 7.2   COMPLETE METABOLIC PANEL WITH GFR     Status: None   Collection Time: 11/17/17  2:15 PM  Result Value Ref Range   Glucose, Bld 84 65 - 139 mg/dL    Comment: .        Non-fasting reference interval .    BUN 19 7 - 25 mg/dL   Creat 0.77 0.60 - 0.93 mg/dL    Comment: For patients >29 years of age, the reference limit for Creatinine is approximately 13% higher for people identified as African-American. .    GFR, Est Non African American 77 > OR = 60 mL/min/1.86m   GFR, Est African American 89 > OR = 60 mL/min/1.782m  BUN/Creatinine Ratio NOT APPLICABLE 6 - 22 (calc)   Sodium 140 135 - 146 mmol/L   Potassium 4.5 3.5 - 5.3 mmol/L   Chloride 103 98 - 110 mmol/L   CO2 29 20 - 32 mmol/L   Calcium 10.0 8.6 - 10.4 mg/dL   Total Protein 7.1 6.1 - 8.1 g/dL   Albumin 4.2 3.6 - 5.1 g/dL   Globulin 2.9 1.9 - 3.7 g/dL (calc)   AG Ratio 1.4 1.0 - 2.5 (calc)   Total Bilirubin 0.4 0.2 - 1.2 mg/dL   Alkaline phosphatase (APISO) 67 33 - 130 U/L   AST 12 10 - 35 U/L   ALT 11 6 - 29 U/L  Lipid panel     Status: None   Collection Time: 11/17/17  2:15 PM  Result Value Ref Range   Cholesterol 181 <200 mg/dL   HDL 75 >50 mg/dL   Triglycerides 116 <150 mg/dL   LDL Cholesterol (Calc) 85 mg/dL (calc)    Comment: Reference range: <100 . Desirable range <100 mg/dL for primary prevention;    <70 mg/dL for patients with CHD or diabetic patients  with > or = 2 CHD risk factors. . Marland KitchenDL-C is now calculated using the Martin-Hopkins  calculation, which is a validated novel method providing  better accuracy than the Friedewald equation in the  estimation of LDL-C.  MaCresenciano Genret al. JAAnnamaria [id number];[id number]  (http://education.[facility].com/faq/FAQ164)    Total CHOL/HDL Ratio 2.4 <5.0 (calc)   Non-HDL Cholesterol (Calc) 106 <130 mg/dL (calc)    Comment: For patients with diabetes plus 1 major ASCVD risk  factor, treating to a non-HDL-C goal of <100 mg/dL  (LDL-C of <70 mg/dL) is considered a therapeutic  option.   CBC with Differential/Platelet     Status: None   Collection Time: 11/17/17  2:15 PM  Result Value Ref Range   WBC 9.8 3.8 - 10.8 Thousand/uL   RBC 4.39 3.80 - 5.10 Million/uL   Hemoglobin 12.5 11.7 - 15.5 g/dL   HCT 36.2 35.0 - 45.0 %   MCV 82.5 80.0 - 100.0 fL   MCH 28.5 27.0 - 33.0 pg   MCHC 34.5 32.0 - 36.0 g/dL   RDW 13.4 11.0 - 15.0 %   Platelets 343 140 - 400 Thousand/uL   MPV 9.9 7.5 - 12.5 fL   Neutro Abs 6,007 1,500 - 7,800 cells/uL   Lymphs Abs 2,744 850 - 3,900 cells/uL   WBC mixed population 657 200 - 950 cells/uL   Eosinophils Absolute 353 15 - 500 cells/uL   Basophils Absolute 39 0 - 200 cells/uL   Neutrophils Relative % 61.3 %   Total Lymphocyte 28.0 %  Monocytes Relative 6.7 %   Eosinophils Relative 3.6 %   Basophils Relative 0.4 %  Vitamin B12     Status: None   Collection Time: 11/17/17  2:15 PM  Result Value Ref Range   Vitamin B-12 425 200 - 1,100 pg/mL  Urine Microalbumin w/creat. ratio     Status: None   Collection Time: 11/17/17  2:15 PM  Result Value Ref Range   Creatinine, Urine 117 20 - 275 mg/dL   Microalb, Ur 0.4 mg/dL    Comment: Reference Range Not established    Microalb Creat Ratio 3 <30 mcg/mg creat    Comment: . The ADA defines abnormalities in albumin excretion as follows: Marland Kitchen Category          Result (mcg/mg creatinine) . Normal                    <30 Microalbuminuria         30-299  Clinical albuminuria   > OR = 300 . The ADA recommends that at least two of three specimens collected within a 3-6 month period be abnormal before considering a patient to be within a diagnostic category.      PHQ2/9: Depression screen [facility] 2/9 07/18/2017 07/18/2017 07/14/2017 01/10/2017 09/09/2016  Decreased Interest 0 0 0 0 0  Down, Depressed, Hopeless 0 0 0 0 0  PHQ - 2 Score 0 0 0 0 0  Altered sleeping 0 - - - -  Tired, decreased energy 0 - - - -  Change in appetite 0 - - - -  Feeling bad or failure about yourself  0 - - - -  Trouble concentrating 0 - - - -  Moving slowly or fidgety/restless 0 - - - -  Suicidal thoughts 0 - - - -  PHQ-9 Score 0 - - - -  Difficult doing work/chores Not difficult at all - - - -     Fall Risk: Fall Risk  12/25/2017 11/17/2017 07/18/2017 07/14/2017 01/10/2017  Falls in the past year? _0   Number falls in past yr: - - - - -  Injury with Fall? - - - - -  Risk for fall due to : - - - - -  Follow up - - - - -     Assessment & Plan  1. Prepatellar bursitis of left knee  Improved. Took one week of meloxicam and after that has been using CBD oils. She asked my opinion and I explained that not an illegal form but not FDA approved and I cannot tell her if it is harmful or not   2. Sciatica of left side  Discussed referral to Ortho, prednisone taper. She states she will try seeing her chiropractor first and leg me know if does not work.

## 2017-12-29 ENCOUNTER — Encounter: Payer: Self-pay | Admitting: Family Medicine

## 2018-01-19 ENCOUNTER — Other Ambulatory Visit: Payer: Self-pay | Admitting: Family Medicine

## 2018-01-19 DIAGNOSIS — E785 Hyperlipidemia, unspecified: Secondary | ICD-10-CM

## 2018-01-30 DIAGNOSIS — Z86010 Personal history of colonic polyps: Secondary | ICD-10-CM | POA: Diagnosis not present

## 2018-01-30 DIAGNOSIS — K649 Unspecified hemorrhoids: Secondary | ICD-10-CM | POA: Diagnosis not present

## 2018-03-01 ENCOUNTER — Other Ambulatory Visit: Payer: Self-pay | Admitting: Family Medicine

## 2018-03-01 DIAGNOSIS — E1121 Type 2 diabetes mellitus with diabetic nephropathy: Secondary | ICD-10-CM

## 2018-03-01 DIAGNOSIS — IMO0002 Reserved for concepts with insufficient information to code with codable children: Secondary | ICD-10-CM

## 2018-03-01 DIAGNOSIS — E1165 Type 2 diabetes mellitus with hyperglycemia: Principal | ICD-10-CM

## 2018-03-04 ENCOUNTER — Encounter
Admission: RE | Disposition: A | Discharge status: Home / Self Care | Payer: Self-pay | Source: Ambulatory Visit | Attending: Internal Medicine

## 2018-03-04 ENCOUNTER — Ambulatory Visit: Payer: Medicare HMO | Admitting: Certified Registered"

## 2018-03-04 ENCOUNTER — Encounter: Payer: Self-pay | Admitting: *Deleted

## 2018-03-04 ENCOUNTER — Ambulatory Visit
Admission: RE | Admit: 2018-03-04 | Discharge: 2018-03-04 | Disposition: A | Discharge status: Home / Self Care | Payer: Medicare HMO | Source: Ambulatory Visit | Attending: Internal Medicine | Admitting: Internal Medicine

## 2018-03-04 DIAGNOSIS — I10 Essential (primary) hypertension: Secondary | ICD-10-CM | POA: Insufficient documentation

## 2018-03-04 DIAGNOSIS — Z8673 Personal history of transient ischemic attack (TIA), and cerebral infarction without residual deficits: Secondary | ICD-10-CM | POA: Insufficient documentation

## 2018-03-04 DIAGNOSIS — Z7982 Long term (current) use of aspirin: Secondary | ICD-10-CM | POA: Insufficient documentation

## 2018-03-04 DIAGNOSIS — E785 Hyperlipidemia, unspecified: Secondary | ICD-10-CM | POA: Diagnosis not present

## 2018-03-04 DIAGNOSIS — K640 First degree hemorrhoids: Secondary | ICD-10-CM | POA: Insufficient documentation

## 2018-03-04 DIAGNOSIS — Z7951 Long term (current) use of inhaled steroids: Secondary | ICD-10-CM | POA: Insufficient documentation

## 2018-03-04 DIAGNOSIS — Z794 Long term (current) use of insulin: Secondary | ICD-10-CM | POA: Diagnosis not present

## 2018-03-04 DIAGNOSIS — D120 Benign neoplasm of cecum: Secondary | ICD-10-CM | POA: Insufficient documentation

## 2018-03-04 DIAGNOSIS — E1151 Type 2 diabetes mellitus with diabetic peripheral angiopathy without gangrene: Secondary | ICD-10-CM | POA: Insufficient documentation

## 2018-03-04 DIAGNOSIS — K219 Gastro-esophageal reflux disease without esophagitis: Secondary | ICD-10-CM | POA: Insufficient documentation

## 2018-03-04 DIAGNOSIS — K649 Unspecified hemorrhoids: Secondary | ICD-10-CM | POA: Diagnosis not present

## 2018-03-04 DIAGNOSIS — Z09 Encounter for follow-up examination after completed treatment for conditions other than malignant neoplasm: Secondary | ICD-10-CM | POA: Diagnosis not present

## 2018-03-04 DIAGNOSIS — Z86010 Personal history of colonic polyps: Secondary | ICD-10-CM | POA: Insufficient documentation

## 2018-03-04 DIAGNOSIS — Z79899 Other long term (current) drug therapy: Secondary | ICD-10-CM | POA: Diagnosis not present

## 2018-03-04 DIAGNOSIS — K5792 Diverticulitis of intestine, part unspecified, without perforation or abscess without bleeding: Secondary | ICD-10-CM | POA: Diagnosis not present

## 2018-03-04 DIAGNOSIS — K5730 Diverticulosis of large intestine without perforation or abscess without bleeding: Secondary | ICD-10-CM | POA: Insufficient documentation

## 2018-03-04 DIAGNOSIS — D122 Benign neoplasm of ascending colon: Secondary | ICD-10-CM | POA: Diagnosis not present

## 2018-03-04 DIAGNOSIS — Z1211 Encounter for screening for malignant neoplasm of colon: Secondary | ICD-10-CM | POA: Diagnosis not present

## 2018-03-04 DIAGNOSIS — K635 Polyp of colon: Secondary | ICD-10-CM | POA: Diagnosis not present

## 2018-03-04 DIAGNOSIS — E119 Type 2 diabetes mellitus without complications: Secondary | ICD-10-CM | POA: Diagnosis not present

## 2018-03-04 HISTORY — DX: Cerebral infarction, unspecified: I63.9

## 2018-03-04 HISTORY — PX: COLONOSCOPY WITH PROPOFOL: SHX5780

## 2018-03-04 LAB — GLUCOSE, CAPILLARY: GLUCOSE-CAPILLARY: 100 mg/dL — AB (ref 70–99)

## 2018-03-04 SURGERY — COLONOSCOPY WITH PROPOFOL
Anesthesia: General

## 2018-03-04 MED ORDER — PROPOFOL 500 MG/50ML IV EMUL
INTRAVENOUS | Status: DC | PRN
  Administered 2018-03-04: 100 ug/kg/min via INTRAVENOUS

## 2018-03-04 MED ORDER — LIDOCAINE HCL (PF) 2 % IJ SOLN
INTRAMUSCULAR | Status: AC
  Filled 2018-03-04: qty 10

## 2018-03-04 MED ORDER — PROPOFOL 500 MG/50ML IV EMUL
INTRAVENOUS | Status: AC
  Filled 2018-03-04: qty 50

## 2018-03-04 MED ORDER — PROPOFOL 10 MG/ML IV BOLUS
INTRAVENOUS | Status: DC | PRN
  Administered 2018-03-04 (×2): 100 mg via INTRAVENOUS

## 2018-03-04 MED ORDER — SODIUM CHLORIDE 0.9 % IV SOLN
INTRAVENOUS | Status: DC
  Administered 2018-03-04: 1000 mL via INTRAVENOUS

## 2018-03-04 MED ORDER — LIDOCAINE HCL (CARDIAC) PF 100 MG/5ML IV SOSY
PREFILLED_SYRINGE | INTRAVENOUS | Status: DC | PRN
  Administered 2018-03-04: 40 mg via INTRAVENOUS

## 2018-03-04 NOTE — Anesthesia Postprocedure Evaluation (Signed)
Anesthesia Post Note  Patient: MERION CATON  Procedure(s) Performed: COLONOSCOPY WITH PROPOFOL (N/A )  Patient location during evaluation: Endoscopy Anesthesia Type: General Level of consciousness: awake and alert, oriented and patient cooperative Pain management: satisfactory to patient Vital Signs Assessment: post-procedure vital signs reviewed and stable Respiratory status: spontaneous breathing and respiratory function stable Cardiovascular status: blood pressure returned to baseline and stable Postop Assessment: no headache, no backache, patient able to bend at knees, no apparent nausea or vomiting, adequate PO intake and able to ambulate Anesthetic complications: no     Last Vitals:  Vitals:   03/04/18 0729 03/04/18 0821  BP: (!) 157/91 (!) 80/65  Pulse: 94 82  Resp: 16 17  Temp: (!) 36.1 C (!) 36.1 C  SpO2: 100% 96%    Last Pain:  Vitals:   03/04/18 0821  TempSrc: Tympanic  PainSc: Asleep                 Clovis Riley Colbert Curenton

## 2018-03-04 NOTE — Anesthesia Preprocedure Evaluation (Signed)
Anesthesia Evaluation  Patient identified by MRN, date of birth, ID band Patient awake    Reviewed: Allergy & Precautions, H&P , NPO status , Patient's Chart, lab work & pertinent test results, reviewed documented beta blocker date and time   Airway Mallampati: II   Neck ROM: full    Dental  (+) Poor Dentition   Pulmonary neg pulmonary ROS,    Pulmonary exam normal        Cardiovascular Exercise Tolerance: Good hypertension, On Medications + Peripheral Vascular Disease  negative cardio ROS Normal cardiovascular exam Rhythm:regular Rate:Normal     Neuro/Psych TIA Neuromuscular disease CVA negative neurological ROS  negative psych ROS   GI/Hepatic negative GI ROS, Neg liver ROS, hiatal hernia, GERD  Medicated,  Endo/Other  negative endocrine ROSdiabetes  Renal/GU Renal diseasenegative Renal ROS  negative genitourinary   Musculoskeletal   Abdominal   Peds  Hematology negative hematology ROS (+)   Anesthesia Other Findings Past Medical History: No date: Diabetes mellitus without complication (HCC) No date: Hyperlipidemia No date: Hypertension No date: Kidney stone No date: Left shoulder pain No date: Obesity No date: Osteoarthritis No date: Ovarian failure No date: Stroke ([place]) No date: Vitamin B12 deficiency (non anemic) Past Surgical History: No date: ABDOMINAL HYSTERECTOMY No date: CHOLECYSTECTOMY 2015: COLONOSCOPY No date: cystoscopic 12/2007: LITHOTRIPSY 11/2009: MYRINGOTOMY; Right BMI    Body Mass Index:  32.77 kg/m     Reproductive/Obstetrics negative OB ROS                             Anesthesia Physical Anesthesia Plan  ASA: III  Anesthesia Plan: General   Post-op Pain Management:    Induction:   PONV Risk Score and Plan:   Airway Management Planned:   Additional Equipment:   Intra-op Plan:   Post-operative Plan:   Informed Consent: I have reviewed  the patients History and Physical, chart, labs and discussed the procedure including the risks, benefits and alternatives for the proposed anesthesia with the patient or authorized representative who has indicated his/her understanding and acceptance.   Dental Advisory Given  Plan Discussed with: CRNA  Anesthesia Plan Comments:         Anesthesia Quick Evaluation

## 2018-03-04 NOTE — Interval H&P Note (Signed)
History and Physical Interval Note:  03/04/2018 7:59 AM  Christine Roberts  has presented today for surgery, with the diagnosis of PHX COLON POLYPS HERMORRHOIDS  The various methods of treatment have been discussed with the patient and family. After consideration of risks, benefits and other options for treatment, the patient has consented to  Procedure(s): COLONOSCOPY WITH PROPOFOL (N/A) as a surgical intervention .  The patient's history has been reviewed, patient examined, no change in status, stable for surgery.  I have reviewed the patient's chart and labs.  Questions were answered to the patient's satisfaction.     [place], [place]

## 2018-03-04 NOTE — Anesthesia Post-op Follow-up Note (Signed)
Anesthesia QCDR form completed.        

## 2018-03-04 NOTE — H&P (Signed)
Outpatient short stay form Pre-procedure 03/04/2018 7:57 AM Teodoro K. Alice Reichert, M.D.  Primary Physician: None  Reason for visit:  Personal hx of colon polyps, hx hemorrhoids.  History of present illness:  72 y/o female presents for personal hx of colon polyps. Has occasional rectal bleeding and pain from hemorrhoids. No weight loss.     Current Facility-Administered Medications:  .  0.9 %  sodium chloride infusion, , Intravenous, Continuous, [place], Benay Pike, MD, Last Rate: 20 mL/hr at 03/04/18 0747, 1,000 mL at 03/04/18 0747  Medications Prior to Admission  Medication Sig Dispense Refill Last Dose  . ACCU-CHEK AVIVA PLUS test strip TEST BLOOD SUGAR UP TO FOUR TIMES DAILY AS DIRECTED 100 each 0   . ACCU-CHEK SOFTCLIX LANCETS lancets 1 each by Other route daily.   Taking  . aspirin EC 81 MG tablet Take 1 tablet (81 mg total) by mouth daily. 90 tablet 0 Taking  . atorvastatin (LIPITOR) 40 MG tablet TAKE 1 TABLET AT BEDTIME 90 tablet 1   . blood glucose meter kit and supplies KIT Dispense based on patient and insurance preference. Use up to four times daily as directed. (FOR ICD-9 250.00, 250.01). 1 each 0 Taking  . calcium citrate (CALCITRATE - DOSED IN MG ELEMENTAL CALCIUM) 950 MG tablet Take 200 mg of elemental calcium by mouth daily.   Taking  . fluticasone (FLONASE) 50 MCG/ACT nasal spray Place 2 sprays into both nostrils daily. 16 g 6 Taking  . hydrocortisone (ANUSOL-HC) 25 MG suppository Place 1 suppository (25 mg total) rectally 2 (two) times daily. 12 suppository 0 Taking  . Insulin Degludec (TRESIBA FLEXTOUCH) 200 UNIT/ML SOPN Inject 50 Units into the skin daily. 27 mL 0 03/04/2018 at 0600  . Insulin Pen Needle (NOVOFINE) 30G X 8 MM MISC Inject 10 each into the skin as needed. 100 each 2 Taking  . Lancet Devices MISC 1 Units by Does not apply route 2 (two) times daily. 100 each 3 Taking  . liraglutide (VICTOZA) 18 MG/3ML SOPN Inject 0.3 mLs (1.8 mg total) into the skin daily. 27 mL 1  Taking  . lisinopril (PRINIVIL,ZESTRIL) 20 MG tablet Take 1 tablet (20 mg total) by mouth daily. 90 tablet 1 Taking  . metFORMIN (GLUCOPHAGE) 1000 MG tablet Take 1 tablet (1,000 mg total) by mouth 2 (two) times daily. 180 tablet 1 Taking  . polyethylene glycol powder (GLYCOLAX/MIRALAX) powder Take 17 g by mouth 2 (two) times daily as needed. 3350 g 1 Taking     Allergies  Allergen Reactions  . Latex Rash  . Erythromycin Other (See Comments)    Strawberry tongue     Past Medical History:  Diagnosis Date  . Diabetes mellitus without complication ([place])   . Hyperlipidemia   . Hypertension   . Kidney stone   . Left shoulder pain   . Obesity   . Osteoarthritis   . Ovarian failure   . Stroke ([place])   . Vitamin B12 deficiency (non anemic)     Review of systems:  Otherwise negative.    Physical Exam  Gen: Alert, oriented. Appears stated age.  HEENT: [place]/AT. PERRLA. Lungs: CTA, no wheezes. CV: RR nl S1, S2. Abd: soft, benign, no masses. BS+ Ext: No edema. Pulses 2+    Planned procedures: Proceed with colonoscopy. The patient understands the nature of the planned procedure, indications, risks, alternatives and potential complications including but not limited to bleeding, infection, perforation, damage to internal organs and possible oversedation/side effects from anesthesia. The patient agrees  and gives consent to proceed.  Please refer to procedure notes for findings, recommendations and patient disposition/instructions.     Teodoro K. Alice Reichert, M.D. Gastroenterology 03/04/2018  7:57 AM

## 2018-03-04 NOTE — Interval H&P Note (Signed)
History and Physical Interval Note:  03/04/2018 7:58 AM  Christine Roberts  has presented today for surgery, with the diagnosis of PHX COLON POLYPS HERMORRHOIDS  The various methods of treatment have been discussed with the patient and family. After consideration of risks, benefits and other options for treatment, the patient has consented to  Procedure(s): COLONOSCOPY WITH PROPOFOL (N/A) as a surgical intervention .  The patient's history has been reviewed, patient examined, no change in status, stable for surgery.  I have reviewed the patient's chart and labs.  Questions were answered to the patient's satisfaction.     [place], [place]

## 2018-03-04 NOTE — Op Note (Signed)
[facility] Gastroenterology Patient Name: Christine Roberts Procedure Date: 03/04/2018 7:18 AM MRN: [id number] Account #: [id number] Date of Birth: 17-Jul-1945 Admit Type: Outpatient Age: 72 Room: [facility] ENDO ROOM 2 Gender: Female Note Status: Finalized Procedure:            Colonoscopy Indications:          High risk colon cancer surveillance: Personal history                        of colonic polyps Providers:            Benay Pike. Alice Reichert MD, MD Referring MD:         Bethena Roys. Sowles, MD (Referring MD) Medicines:            Propofol per Anesthesia Complications:        No immediate complications. Procedure:            Pre-Anesthesia Assessment:                       - The risks and benefits of the procedure and the                        sedation options and risks were discussed with the                        patient. All questions were answered and informed                        consent was obtained.                       - Patient identification and proposed procedure were                        verified prior to the procedure by the nurse. The                        procedure was verified in the procedure room.                       - ASA Grade Assessment: III - A patient with severe                        systemic disease.                       - After reviewing the risks and benefits, the patient                        was deemed in satisfactory condition to undergo the                        procedure.                       After obtaining informed consent, the colonoscope was                        passed under direct vision. Throughout the procedure,  the patient's blood pressure, pulse, and oxygen                        saturations were monitored continuously. The                        Colonoscope was introduced through the anus and                        advanced to the the cecum, identified by appendiceal   orifice and ileocecal valve. The colonoscopy was                        performed without difficulty. The patient tolerated the                        procedure well. The quality of the bowel preparation                        was excellent. The ileocecal valve, appendiceal                        orifice, and rectum were photographed. Findings:      The perianal and digital rectal examinations were normal. Pertinent       negatives include normal sphincter tone and no palpable rectal lesions.      A few small-mouthed diverticula were found in the left colon.      Four sessile polyps were found in the ascending colon and cecum. The       polyps were 2 to 3 mm in size. These polyps were removed with a jumbo       cold forceps. Resection and retrieval were complete.      Non-bleeding internal hemorrhoids were found during retroflexion. The       hemorrhoids were Grade I (internal hemorrhoids that do not prolapse).      The exam was otherwise without abnormality. Impression:           - Diverticulosis in the left colon.                       - Four 2 to 3 mm polyps in the ascending colon and in                        the cecum, removed with a jumbo cold forceps. Resected                        and retrieved.                       - Non-bleeding internal hemorrhoids.                       - The examination was otherwise normal. Recommendation:       - Patient has a contact number available for                        emergencies. The signs and symptoms of potential                        delayed complications were discussed with the patient.  Return to normal activities tomorrow. Written discharge                        instructions were provided to the patient.                       - Resume previous diet.                       - Continue present medications.                       - Repeat colonoscopy is recommended for surveillance.                        The  colonoscopy date will be determined after pathology                        results from today's exam become available for review.                       - Return to GI office PRN.                       - The findings and recommendations were discussed with                        the patient and their family. Procedure Code(s):    --- Professional ---                       [id number], Colonoscopy, flexible; with biopsy, single or                        multiple Diagnosis Code(s):    --- Professional ---                       K57.30, Diverticulosis of large intestine without                        perforation or abscess without bleeding                       D12.0, Benign neoplasm of cecum                       D12.2, Benign neoplasm of ascending colon                       K64.0, First degree hemorrhoids                       Z86.010, Personal history of colonic polyps CPT copyright 2017 American Medical Association. All rights reserved. The codes documented in this report are preliminary and upon coder review may  be revised to meet current compliance requirements. Efrain Sella MD, MD 03/04/2018 8:21:02 AM This report has been signed electronically. Number of Addenda: 0 Note Initiated On: 03/04/2018 7:18 AM Scope Withdrawal Time: 0 hours 8 minutes 49 seconds  Total Procedure Duration: 0 hours 12 minutes 46 seconds       [facility]

## 2018-03-04 NOTE — Transfer of Care (Signed)
Immediate Anesthesia Transfer of Care Note  Patient: Christine Roberts  Procedure(s) Performed: COLONOSCOPY WITH PROPOFOL (N/A )  Patient Location: Endoscopy Unit  Anesthesia Type:General  Level of Consciousness: drowsy and patient cooperative  Airway & Oxygen Therapy: Patient Spontanous Breathing  Post-op Assessment: Report given to RN, Post -op Vital signs reviewed and stable and Patient moving all extremities  Post vital signs: Reviewed and stable  Last Vitals:  Vitals Value Taken Time  BP 80/65 03/04/2018  8:22 AM  Temp 36.1 C 03/04/2018  8:21 AM  Pulse 83 03/04/2018  8:23 AM  Resp 18 03/04/2018  8:23 AM  SpO2 97 % 03/04/2018  8:23 AM  Vitals shown include unvalidated device data.  Last Pain:  Vitals:   03/04/18 0821  TempSrc: Tympanic  PainSc: Asleep         Complications: No apparent anesthesia complications

## 2018-03-05 ENCOUNTER — Encounter: Payer: Self-pay | Admitting: Internal Medicine

## 2018-03-06 LAB — SURGICAL PATHOLOGY

## 2018-03-14 ENCOUNTER — Other Ambulatory Visit: Payer: Self-pay

## 2018-03-14 ENCOUNTER — Encounter: Payer: Self-pay | Admitting: Emergency Medicine

## 2018-03-14 ENCOUNTER — Emergency Department
Admission: EM | Admit: 2018-03-14 | Discharge: 2018-03-14 | Disposition: A | Discharge status: Home / Self Care | Payer: Medicare HMO | Attending: Emergency Medicine | Admitting: Emergency Medicine

## 2018-03-14 DIAGNOSIS — W5582XA Struck by other mammals, initial encounter: Secondary | ICD-10-CM | POA: Diagnosis not present

## 2018-03-14 DIAGNOSIS — Y929 Unspecified place or not applicable: Secondary | ICD-10-CM | POA: Insufficient documentation

## 2018-03-14 DIAGNOSIS — Z8673 Personal history of transient ischemic attack (TIA), and cerebral infarction without residual deficits: Secondary | ICD-10-CM | POA: Insufficient documentation

## 2018-03-14 DIAGNOSIS — I10 Essential (primary) hypertension: Secondary | ICD-10-CM | POA: Diagnosis not present

## 2018-03-14 DIAGNOSIS — S51801A Unspecified open wound of right forearm, initial encounter: Secondary | ICD-10-CM | POA: Diagnosis not present

## 2018-03-14 DIAGNOSIS — E1140 Type 2 diabetes mellitus with diabetic neuropathy, unspecified: Secondary | ICD-10-CM | POA: Insufficient documentation

## 2018-03-14 DIAGNOSIS — Z794 Long term (current) use of insulin: Secondary | ICD-10-CM | POA: Diagnosis not present

## 2018-03-14 DIAGNOSIS — S51811A Laceration without foreign body of right forearm, initial encounter: Secondary | ICD-10-CM

## 2018-03-14 DIAGNOSIS — Y939 Activity, unspecified: Secondary | ICD-10-CM | POA: Diagnosis not present

## 2018-03-14 DIAGNOSIS — Z79899 Other long term (current) drug therapy: Secondary | ICD-10-CM | POA: Insufficient documentation

## 2018-03-14 DIAGNOSIS — Z23 Encounter for immunization: Secondary | ICD-10-CM | POA: Diagnosis not present

## 2018-03-14 DIAGNOSIS — Y999 Unspecified external cause status: Secondary | ICD-10-CM | POA: Diagnosis not present

## 2018-03-14 MED ORDER — TETANUS-DIPHTH-ACELL PERTUSSIS 5-2.5-18.5 LF-MCG/0.5 IM SUSP
0.5000 mL | Freq: Once | INTRAMUSCULAR | Status: AC
  Administered 2018-03-14: 0.5 mL via INTRAMUSCULAR
  Filled 2018-03-14: qty 0.5

## 2018-03-14 MED ORDER — CEPHALEXIN 500 MG PO CAPS: 500.0000 mg | ORAL_CAPSULE | Freq: Three times a day (TID) | ORAL | 0 refills | Status: DC

## 2018-03-14 MED ORDER — MUPIROCIN 2 % EX OINT: 1.0000 "application " | TOPICAL_OINTMENT | Freq: Two times a day (BID) | CUTANEOUS | 0 refills | Status: DC

## 2018-03-14 MED ORDER — BACITRACIN-NEOMYCIN-POLYMYXIN 400-5-5000 EX OINT
TOPICAL_OINTMENT | Freq: Once | CUTANEOUS | Status: AC
  Administered 2018-03-14: 1 via TOPICAL
  Filled 2018-03-14: qty 1

## 2018-03-14 NOTE — ED Notes (Signed)
NAD noted at time of D/C. Pt denies questions or concerns. Pt ambulatory to the lobby at this time.  

## 2018-03-14 NOTE — ED Triage Notes (Signed)
Patient states she was grazed by a goat horn last PM, pet goat belongs to her.  Has 4-5 inch superficial laceration/skin tear right forearm dorsal surface.  Sterile 4X4 and kling wrapped on arm.  Not actively bleeding.

## 2018-03-14 NOTE — ED Provider Notes (Signed)
[facility] Emergency Department Provider Note  ____________________________________________   First MD Initiated Contact with Patient 03/14/18 1131     (approximate)  I have reviewed the triage vital signs and the nursing notes.   HISTORY  Chief Complaint Extremity Laceration    HPI Christine Roberts is a 72 y.o. female presents emergency department complaining of a skin tear to the right forearm.  States she needs a tetanus.  She states her pet goat brushed her arm with his horn which cut her arm.  She denies any fever or chills.  She denies any other injuries.    Past Medical History:  Diagnosis Date  . Diabetes mellitus without complication ([place])   . Hyperlipidemia   . Hypertension   . Kidney stone   . Left shoulder pain   . Obesity   . Osteoarthritis   . Ovarian failure   . Stroke ([place])   . Vitamin B12 deficiency (non anemic)     Patient Active Problem List   Diagnosis Date Noted  . Seasonal allergic rhinitis 09/12/2015  . TIA (transient ischemic attack) 05/24/2015  . Senile purpura ([place]) 05/09/2015  . Benign essential HTN 03/18/2015  . Pain in shoulder 03/18/2015  . Dyslipidemia 03/18/2015  . History of surgery to major organs, presenting hazards to health 03/18/2015  . Hearing loss 03/18/2015  . Hiatal hernia 03/18/2015  . History of colon polyps 03/18/2015  . Calculus of kidney 03/18/2015  . B12 deficiency 03/18/2015  . Microalbuminuria 03/18/2015  . Adult BMI 30+ 03/18/2015  . Arthritis, degenerative 03/18/2015  . Diabetes mellitus with nephropathy ([place]) 03/18/2015    Past Surgical History:  Procedure Laterality Date  . ABDOMINAL HYSTERECTOMY    . CHOLECYSTECTOMY    . COLONOSCOPY  2015  . COLONOSCOPY WITH PROPOFOL N/A 03/04/2018   Procedure: COLONOSCOPY WITH PROPOFOL;  Surgeon: [place], Benay Pike, MD;  Location: [facility] ENDOSCOPY;  Service: Gastroenterology;  Laterality: N/A;  . cystoscopic    . LITHOTRIPSY  12/2007  .  MYRINGOTOMY Right 11/2009    Prior to Admission medications   Medication Sig Start Date End Date Taking? Authorizing Provider  ACCU-CHEK AVIVA PLUS test strip TEST BLOOD SUGAR UP TO FOUR TIMES DAILY AS DIRECTED 03/01/18   Ancil Boozer, Drue Stager, MD  ACCU-CHEK SOFTCLIX LANCETS lancets 1 each by Other route daily. 08/26/17   [provider]  aspirin EC 81 MG tablet Take 1 tablet (81 mg total) by mouth daily. 03/21/15   Steele Sizer, MD  atorvastatin (LIPITOR) 40 MG tablet TAKE 1 TABLET AT BEDTIME 01/19/18   Steele Sizer, MD  blood glucose meter kit and supplies KIT Dispense based on patient and insurance preference. Use up to four times daily as directed. (FOR ICD-9 250.00, 250.01). 08/22/17   Steele Sizer, MD  calcium citrate (CALCITRATE - DOSED IN MG ELEMENTAL CALCIUM) 950 MG tablet Take 200 mg of elemental calcium by mouth daily.    [provider]  cephALEXin (KEFLEX) 500 MG capsule Take 1 capsule (500 mg total) by mouth 3 (three) times daily. 03/14/18   Fisher, Linden Dolin, PA-C  fluticasone (FLONASE) 50 MCG/ACT nasal spray Place 2 sprays into both nostrils daily. 08/08/15   Steele Sizer, MD  hydrocortisone (ANUSOL-HC) 25 MG suppository Place 1 suppository (25 mg total) rectally 2 (two) times daily. 07/14/17   Steele Sizer, MD  Insulin Degludec (TRESIBA FLEXTOUCH) 200 UNIT/ML SOPN Inject 50 Units into the skin daily. 11/17/17   Steele Sizer, MD  Insulin Pen Needle (NOVOFINE) 30G  X 8 MM MISC Inject 10 each into the skin as needed. 11/02/15   Steele Sizer, MD  Lancet Devices MISC 1 Units by Does not apply route 2 (two) times daily. 10/27/15   Steele Sizer, MD  liraglutide (VICTOZA) 18 MG/3ML SOPN Inject 0.3 mLs (1.8 mg total) into the skin daily. 11/17/17   Steele Sizer, MD  lisinopril (PRINIVIL,ZESTRIL) 20 MG tablet Take 1 tablet (20 mg total) by mouth daily. 11/17/17   Steele Sizer, MD  metFORMIN (GLUCOPHAGE) 1000 MG tablet Take 1 tablet (1,000 mg total) by mouth 2 (two)  times daily. 11/17/17   Steele Sizer, MD  mupirocin ointment (BACTROBAN) 2 % Apply 1 application topically 2 (two) times daily. 03/14/18   Fisher, Linden Dolin, PA-C  polyethylene glycol powder (GLYCOLAX/MIRALAX) powder Take 17 g by mouth 2 (two) times daily as needed. 11/17/17   Steele Sizer, MD    Allergies Latex and Erythromycin  Family History  Problem Relation Age of Onset  . Cancer Mother   . Diabetes Mother   . Diabetes Daughter   . CAD Father   . Diabetes Daughter        hypoglycemia    Social History Social History   Tobacco Use  . Smoking status: Never Smoker  . Smokeless tobacco: Never Used  . Tobacco comment: smoking cessation materials not required  Substance Use Topics  . Alcohol use: Yes    Alcohol/week: 1.0 standard drinks    Types: 1 Shots of liquor per week    Comment: socially  . Drug use: No    Review of Systems  Constitutional: No fever/chills Eyes: No visual changes. ENT: No sore throat. Respiratory: Denies cough Genitourinary: Negative for dysuria. Musculoskeletal: Negative for back pain. Skin: Negative for rash.  Skin tear to the right forearm    ____________________________________________   PHYSICAL EXAM:  VITAL SIGNS: ED Triage Vitals  Enc Vitals Group     BP 03/14/18 1052 103/72     Pulse Rate 03/14/18 1052 88     Resp 03/14/18 1052 16     Temp 03/14/18 1052 98.2 F (36.8 C)     Temp Source 03/14/18 1052 Oral     SpO2 03/14/18 1052 95 %     Weight 03/14/18 1101 185 lb (83.9 kg)     Height 03/14/18 1101 5' 3"  (1.6 m)     Head Circumference --      Peak Flow --      Pain Score 03/14/18 1101 6     Pain Loc --      Pain Edu? --      Excl. in [place]? --     Constitutional: Alert and oriented. Well appearing and in no acute distress. Eyes: Conjunctivae are normal.  Head: Atraumatic. Nose: No congestion/rhinnorhea. Mouth/Throat: Mucous membranes are moist.   Neck:  supple no lymphadenopathy noted Cardiovascular: Normal rate,  regular rhythm. Respiratory: Normal respiratory effort.  No retractions GU: deferred Musculoskeletal: FROM all extremities, warm and well perfused.  Positive for a large skin tear on the right forearm.  The area has dried blood and crusting noted on it.  There is no open weeping area.  There is no bony tenderness noted.  Neurovascular is intact. Neurologic:  Normal speech and language.  Skin:  Skin is warm, dry.  Positive for a 6 inch skin tear on the right forearm Psychiatric: Mood and affect are normal. Speech and behavior are normal.  ____________________________________________   LABS (all labs ordered are listed, but only  abnormal results are displayed)  Labs Reviewed - No data to display ____________________________________________   ____________________________________________  RADIOLOGY    ____________________________________________   PROCEDURES  Procedure(s) performed: No  Procedures    ____________________________________________   INITIAL IMPRESSION / ASSESSMENT AND PLAN / ED COURSE  Pertinent labs & imaging results that were available during my care of the patient were reviewed by me and considered in my medical decision making (see chart for details).   Patient is a 72 year old female presents emergency department with concerns of needing a tetanus due to a large skin tear on her right forearm.  She states she was outside with her pet goat and his horn scraped up her arm.  She is unsure of her last tetanus.  She denies any fever or chills.  States that area is starting to heal.  On physical exam patient appears well.  She is afebrile.  The right forearm has a 6 inch long skin tear which is already starting to heal.  There is dried crusting blood.  There is a slight amount of redness surrounding the wound.  Neurovascular is intact.  The patient was given a Tdap.  She was given a prescription for Keflex and Bactroban ointment.  She is to take the medications  as prescribed.  She is to follow-up with her regular doctor if not better in 3 to 5 days.  Return emergency department if the redness is increased and she is having more pain.  The patient states she understands will comply with treatment plan.  She is discharged in stable condition     As part of my medical decision making, I reviewed the following data within the [place] notes reviewed and incorporated, Old chart reviewed, Notes from prior ED visits and [place] Controlled Substance Database  ____________________________________________   FINAL CLINICAL IMPRESSION(S) / ED DIAGNOSES  Final diagnoses:  Skin tear of right forearm without complication, initial encounter      NEW MEDICATIONS STARTED DURING THIS VISIT:  Discharge Medication List as of 03/14/2018 11:48 AM    START taking these medications   Details  cephALEXin (KEFLEX) 500 MG capsule Take 1 capsule (500 mg total) by mouth 3 (three) times daily., Starting Sat 03/14/2018, Normal    mupirocin ointment (BACTROBAN) 2 % Apply 1 application topically 2 (two) times daily., Starting Sat 03/14/2018, Normal         Note:  This document was prepared using Dragon voice recognition software and may include unintentional dictation errors.    Versie Starks, PA-C 03/14/18 1351    Lisa Roca, MD 03/14/18 [phone]

## 2018-03-14 NOTE — ED Notes (Signed)
Pt states the horn of her goat cut her forearm yesterday around 6pm. She cleaned it with betadine but believes she needs a tetanus. Arm was dressed in triage.

## 2018-03-14 NOTE — Discharge Instructions (Signed)
Apply the Bactroban ointment twice daily.  Take the antibiotic as prescribed.  If the redness is increasing please return to the emergency department or see your regular doctor.  Your tetanus was updated and is now good for 10 years.

## 2018-03-30 ENCOUNTER — Ambulatory Visit: Payer: Medicare HMO | Admitting: Family Medicine

## 2018-03-30 ENCOUNTER — Encounter: Payer: Self-pay | Admitting: Family Medicine

## 2018-03-30 VITALS — BP 132/62 | HR 99 | Temp 97.7°F | Resp 18 | Ht 63.0 in | Wt 192.6 lb

## 2018-03-30 DIAGNOSIS — Z23 Encounter for immunization: Secondary | ICD-10-CM

## 2018-03-30 DIAGNOSIS — T3695XA Adverse effect of unspecified systemic antibiotic, initial encounter: Secondary | ICD-10-CM

## 2018-03-30 DIAGNOSIS — E785 Hyperlipidemia, unspecified: Secondary | ICD-10-CM | POA: Diagnosis not present

## 2018-03-30 DIAGNOSIS — D692 Other nonthrombocytopenic purpura: Secondary | ICD-10-CM

## 2018-03-30 DIAGNOSIS — E1121 Type 2 diabetes mellitus with diabetic nephropathy: Secondary | ICD-10-CM

## 2018-03-30 DIAGNOSIS — Z8673 Personal history of transient ischemic attack (TIA), and cerebral infarction without residual deficits: Secondary | ICD-10-CM | POA: Diagnosis not present

## 2018-03-30 DIAGNOSIS — H00011 Hordeolum externum right upper eyelid: Secondary | ICD-10-CM | POA: Diagnosis not present

## 2018-03-30 DIAGNOSIS — E538 Deficiency of other specified B group vitamins: Secondary | ICD-10-CM | POA: Diagnosis not present

## 2018-03-30 DIAGNOSIS — I10 Essential (primary) hypertension: Secondary | ICD-10-CM

## 2018-03-30 DIAGNOSIS — B379 Candidiasis, unspecified: Secondary | ICD-10-CM

## 2018-03-30 LAB — POCT GLYCOSYLATED HEMOGLOBIN (HGB A1C): HbA1c, POC (controlled diabetic range): 8.1 % — AB (ref 0.0–7.0)

## 2018-03-30 MED ORDER — LISINOPRIL 20 MG PO TABS: 20.0000 mg | ORAL_TABLET | Freq: Every day | ORAL | 1 refills | Status: DC

## 2018-03-30 MED ORDER — ATORVASTATIN CALCIUM 40 MG PO TABS: 40.0000 mg | ORAL_TABLET | Freq: Every day | ORAL | 0 refills | Status: DC

## 2018-03-30 MED ORDER — METFORMIN HCL ER 750 MG PO TB24: 750.0000 mg | ORAL_TABLET | Freq: Every day | ORAL | 1 refills | Status: DC

## 2018-03-30 MED ORDER — FLUCONAZOLE 150 MG PO TABS: 150.0000 mg | ORAL_TABLET | ORAL | 0 refills | Status: DC

## 2018-03-30 NOTE — Progress Notes (Signed)
Name: Christine Roberts   MRN: [id number]    DOB: 1945/12/30   Date:03/30/2018       Progress Note  Subjective  Chief Complaint  Chief Complaint  Patient presents with  . Medication Refill  . Diabetes  . Hyperlipidemia  . Hypertension    HPI  DMII: she is on Metformin, [name] but off Victoza because of cost. She is on ace for microalbuminuria and denies side effects of medications. She exercises but is not eating healthy, still drinking sweet tea and startches. hgbA1C has gone up to 8.1% since she stopped Victoza. She has been going to the gym 3 times a week. She denies polyphagia, polyuria or polydipsia . She is due for eye exam  HTN - taking medication, no side effects, no chest pain or palpitation. BP is at goal today, no longer having dizziness, denies chest pain or palpitation  Obesity: she has lost 31 lbsfromDecember 2016 to Dec 2017, gained11lbs in 2018, lost 5 lbs since. She will resume a healthier diet, with more chicken, eggs and less starches.   Hyperlipidemia: taking lipitor a few times a week to avoid side effects, otherwise she has leg pains  TIA versus Complicated migraine: she developed dizziness, headache and aphasia on 11/16 - she was transported by EMS to [facility], she had normal Echo, CT, MRI of brain and carotid dopplers. She was seen by neurologist and advised to continue on aspirin and lipitor in case it was a TIA, but likely a complicated migraine. No other episodes since. She states only had migraine many years ago when on ocp. Doing well, only taking lipitor a couple times a week   Constipation and internal hemorrhoids: diagnosed years ago, has [place] 3-6 She used anusol HC during last hemorrhoids flare, doing better, no bleeding at this time Had colonoscopy 02/2018 and 4 polyps removed and abdominal pain resolved.   B12 deficiency: not currently taking B12 supplementation, advised to take supplements about once a week  Tremors: head when she needs to  hold head still, stable    Senile purpura: stable on arms, recently got hurt by the horn or her goat on her right arm, went to [facility] got antibiotics and is healing now  Hordeolum of right eyelid, going on for the past 6 weeks and not better with warm compresses, advised to follow up with optometrist.    Patient Active Problem List   Diagnosis Date Noted  . Seasonal allergic rhinitis 09/12/2015  . TIA (transient ischemic attack) 05/24/2015  . Senile purpura ([place]) 05/09/2015  . Benign essential HTN 03/18/2015  . Pain in shoulder 03/18/2015  . Dyslipidemia 03/18/2015  . History of surgery to major organs, presenting hazards to health 03/18/2015  . Hearing loss 03/18/2015  . Hiatal hernia 03/18/2015  . History of colon polyps 03/18/2015  . Calculus of kidney 03/18/2015  . B12 deficiency 03/18/2015  . Microalbuminuria 03/18/2015  . Adult BMI 30+ 03/18/2015  . Arthritis, degenerative 03/18/2015  . Diabetes mellitus with nephropathy ([place]) 03/18/2015    Past Surgical History:  Procedure Laterality Date  . ABDOMINAL HYSTERECTOMY    . CHOLECYSTECTOMY    . COLONOSCOPY  2015  . COLONOSCOPY WITH PROPOFOL N/A 03/04/2018   Procedure: COLONOSCOPY WITH PROPOFOL;  Surgeon: Toledo, Benay Pike, MD;  Location: [facility] ENDOSCOPY;  Service: Gastroenterology;  Laterality: N/A;  . cystoscopic    . LITHOTRIPSY  12/2007  . MYRINGOTOMY Right 11/2009    Family History  Problem Relation Age of Onset  . Cancer Mother   .  Diabetes Mother   . Diabetes Daughter   . CAD Father   . Diabetes Daughter        hypoglycemia    Social History   Socioeconomic History  . Marital status: Widowed    Spouse name: Not on file  . Number of children: 2  . Years of education: Not on file  . Highest education level: Associate degree: occupational, Hotel manager, or vocational program  Occupational History    Employer: [employer]  Social Needs  . Financial resource strain: Somewhat hard  . Food  insecurity:    Worry: Never true    Inability: Never true  . Transportation needs:    Medical: No    Non-medical: No  Tobacco Use  . Smoking status: Never Smoker  . Smokeless tobacco: Never Used  . Tobacco comment: smoking cessation materials not required  Substance and Sexual Activity  . Alcohol use: Yes    Alcohol/week: 1.0 standard drinks    Types: 1 Shots of liquor per week    Comment: socially  . Drug use: No  . Sexual activity: Not Currently  Lifestyle  . Physical activity:    Days per week: 3 days    Minutes per session: 60 min  . Stress: Not at all  Relationships  . Social connections:    Talks on phone: Patient refused    Gets together: Patient refused    Attends religious service: Patient refused    Active member of club or organization: Patient refused    Attends meetings of clubs or organizations: Patient refused    Relationship status: Widowed  . Intimate partner violence:    Fear of current or ex partner: No    Emotionally abused: No    Physically abused: No    Forced sexual activity: No  Other Topics Concern  . Not on file  Social History Narrative  . Not on file     Current Outpatient Medications:  .  ACCU-CHEK AVIVA PLUS test strip, TEST BLOOD SUGAR UP TO FOUR TIMES DAILY AS DIRECTED, Disp: 100 each, Rfl: 0 .  ACCU-CHEK SOFTCLIX LANCETS lancets, 1 each by Other route daily., Disp: , Rfl:  .  aspirin EC 81 MG tablet, Take 1 tablet (81 mg total) by mouth daily., Disp: 90 tablet, Rfl: 0 .  atorvastatin (LIPITOR) 40 MG tablet, TAKE 1 TABLET AT BEDTIME, Disp: 90 tablet, Rfl: 1 .  blood glucose meter kit and supplies KIT, Dispense based on patient and insurance preference. Use up to four times daily as directed. (FOR ICD-9 250.00, 250.01)., Disp: 1 each, Rfl: 0 .  calcium citrate (CALCITRATE - DOSED IN MG ELEMENTAL CALCIUM) 950 MG tablet, Take 200 mg of elemental calcium by mouth daily., Disp: , Rfl:  .  cephALEXin (KEFLEX) 500 MG capsule, Take 1 capsule  (500 mg total) by mouth 3 (three) times daily., Disp: 21 capsule, Rfl: 0 .  fluticasone (FLONASE) 50 MCG/ACT nasal spray, Place 2 sprays into both nostrils daily., Disp: 16 g, Rfl: 6 .  hydrocortisone (ANUSOL-HC) 25 MG suppository, Place 1 suppository (25 mg total) rectally 2 (two) times daily., Disp: 12 suppository, Rfl: 0 .  Insulin Degludec (TRESIBA FLEXTOUCH) 200 UNIT/ML SOPN, Inject 50 Units into the skin daily., Disp: 27 mL, Rfl: 0 .  Insulin Pen Needle (NOVOFINE) 30G X 8 MM MISC, Inject 10 each into the skin as needed., Disp: 100 each, Rfl: 2 .  Lancet Devices MISC, 1 Units by Does not apply route 2 (two) times  daily., Disp: 100 each, Rfl: 3 .  lisinopril (PRINIVIL,ZESTRIL) 20 MG tablet, Take 1 tablet (20 mg total) by mouth daily., Disp: 90 tablet, Rfl: 1 .  metFORMIN (GLUCOPHAGE) 1000 MG tablet, Take 1 tablet (1,000 mg total) by mouth 2 (two) times daily., Disp: 180 tablet, Rfl: 1 .  mupirocin ointment (BACTROBAN) 2 %, Apply 1 application topically 2 (two) times daily., Disp: 22 g, Rfl: 0 .  polyethylene glycol powder (GLYCOLAX/MIRALAX) powder, Take 17 g by mouth 2 (two) times daily as needed., Disp: 3350 g, Rfl: 1 .  liraglutide (VICTOZA) 18 MG/3ML SOPN, Inject 0.3 mLs (1.8 mg total) into the skin daily. (Patient not taking: Reported on 03/30/2018), Disp: 27 mL, Rfl: 1  Allergies  Allergen Reactions  . Latex Rash  . Erythromycin Other (See Comments)    Strawberry tongue    I personally reviewed active problem list, medication list, allergies, family history, social history with the patient/caregiver today.   ROS  Constitutional: Negative for fever or weight change.  Respiratory: Negative for cough and shortness of breath.   Cardiovascular: Negative for chest pain or palpitations.  Gastrointestinal: Negative for abdominal pain, no bowel changes.  Musculoskeletal: Negative for gait problem or joint swelling.  Skin: Negative for rash.  Neurological: Negative for dizziness or  headache.  No other specific complaints in a complete review of systems (except as listed in HPI above).  Objective  Vitals:   03/30/18 1555  BP: 132/62  Pulse: 99  Resp: 18  Temp: 97.7 F (36.5 C)  TempSrc: Oral  SpO2: 97%  Weight: 192 lb 10.1 oz (87.4 kg)  Height: 5' 3"  (1.6 m)    Body mass index is 34.12 kg/m.  Physical Exam  Constitutional: Patient appears well-developed and well-nourished. Obese  No distress.  HEENT: head atraumatic, normocephalic, pupils equal and reactive to light,  neck supple, throat within normal limits Cardiovascular: Normal rate, regular rhythm and normal heart sounds.  No murmur heard. No BLE edema. Pulmonary/Chest: Effort normal and breath sounds normal. No respiratory distress. Abdominal: Soft.  There is no tenderness. Psychiatric: Patient has a normal mood and affect. behavior is normal. Judgment and thought content normal.  Recent Results (from the past 2160 hour(s))  Glucose, capillary     Status: Abnormal   Collection Time: 03/04/18  7:33 AM  Result Value Ref Range   Glucose-Capillary 100 (H) 70 - 99 mg/dL  Surgical pathology     Status: None   Collection Time: 03/04/18  8:10 AM  Result Value Ref Range   SURGICAL PATHOLOGY      Surgical Pathology CASE: [id number] PATIENT: Cathaleen Critz Surgical Pathology Report     SPECIMEN SUBMITTED: A. Colon polyp x2, cecum; cbx B. Colon polyps x2, ascending; cbx  CLINICAL HISTORY: None provided  PRE-OPERATIVE DIAGNOSIS: PHX colon polyps, hemorrhoids  POST-OPERATIVE DIAGNOSIS: None provided.     DIAGNOSIS: A.  COLON POLYP X 2, CECUM; COLD BIOPSY: - TUBULAR ADENOMA, NEGATIVE FOR HIGH-GRADE DYSPLASIA AND MALIGNANCY. - FRAGMENT WITH PROMINENT LYMPHOID AGGREGATE.  B.  COLON POLYPS X 2, ASCENDING; COLD BIOPSY: - TUBULAR ADENOMAS, 2 FRAGMENTS. - NEGATIVE FOR HIGH-GRADE DYSPLASIA AND MALIGNANCY.   GROSS DESCRIPTION: A. Labeled: Cbx cecal polyp in colon x2 Received: In  formalin Tissue fragment(s): 2 Size: 0.5 and 0.6 cm Description: Pink-tan fragments Entirely submitted in one cassette.  B. Labeled: Cbx ascending colon polyps x2 Received: In formalin Tissue fragment(s): 2 Size: 0.3-0.4 cm Description: Pink-tan fragments Entirely submi tted in one cassette.   Final Diagnosis  performed by Bryan Lemma, MD.   Electronically signed 03/06/2018 3:09:53PM The electronic signature indicates that the named Attending Pathologist has evaluated the specimen  Technical component performed at [facility], [address] [place], [place] [address] Lab: [phone] Dir: Rush Farmer, MD, MMM  Professional component performed at [facility], [facility], [place], [place],  [address] Lab: [phone] Dir: Dellia Nims. Rubinas, MD   POCT HgB A1C     Status: Abnormal   Collection Time: 03/30/18  4:05 PM  Result Value Ref Range   Hemoglobin A1C     HbA1c POC (<> result, manual entry)     HbA1c, POC (prediabetic range)     HbA1c, POC (controlled diabetic range) 8.1 (A) 0.0 - 7.0 %     PHQ2/9: Depression screen [facility] 2/9 03/30/2018 07/18/2017 07/18/2017 07/14/2017 01/10/2017  Decreased Interest 0 0 0 0 0  Down, Depressed, Hopeless 0 0 0 0 0  PHQ - 2 Score 0 0 0 0 0  Altered sleeping - 0 - - -  Tired, decreased energy - 0 - - -  Change in appetite - 0 - - -  Feeling bad or failure about yourself  - 0 - - -  Trouble concentrating - 0 - - -  Moving slowly or fidgety/restless - 0 - - -  Suicidal thoughts - 0 - - -  PHQ-9 Score - 0 - - -  Difficult doing work/chores - Not difficult at all - - -    Fall Risk: Fall Risk  03/30/2018 12/25/2017 11/17/2017 07/18/2017 07/14/2017  Falls in the past year? No No No No No  Number falls in past yr: - - - - -  Injury with Fall? - - - - -  Risk for fall due to : - - - - -  Follow up - - - - -    Functional Status Survey: Is the patient deaf or have difficulty hearing?: No Does the patient have difficulty  seeing, even when wearing glasses/contacts?: No Does the patient have difficulty concentrating, remembering, or making decisions?: No Does the patient have difficulty walking or climbing stairs?: No Does the patient have difficulty dressing or bathing?: No Does the patient have difficulty doing errands alone such as visiting a doctor's office or shopping?: No    Assessment & Plan  1. Type 2 diabetes with nephropathy (HCC)  - POCT HgB A1C - lisinopril (PRINIVIL,ZESTRIL) 20 MG tablet; Take 1 tablet (20 mg total) by mouth daily.  Dispense: 90 tablet; Refill: 1  2. Need for immunization against influenza  - Flu vaccine HIGH DOSE PF (Fluzone High dose)  3. B12 deficiency   4. Dyslipidemia  On statin therapy   5. Hx-TIA (transient ischemic attack)   6. Benign essential HTN  - lisinopril (PRINIVIL,ZESTRIL) 20 MG tablet; Take 1 tablet (20 mg total) by mouth daily.  Dispense: 90 tablet; Refill: 1  7. Senile purpura (HCC)  stable  8. Antibiotic-induced yeast infection  - fluconazole (DIFLUCAN) 150 MG tablet; Take 1 tablet (150 mg total) by mouth every other day.  Dispense: 3 tablet; Refill: 0  9. Hordeolum externum of right upper eyelid  Going on for 6 weeks and not resoling with warm compresses, advised to follow up with optometrist

## 2018-04-01 DIAGNOSIS — H0013 Chalazion right eye, unspecified eyelid: Secondary | ICD-10-CM | POA: Diagnosis not present

## 2018-04-01 LAB — HM DIABETES EYE EXAM

## 2018-04-02 ENCOUNTER — Encounter: Payer: Self-pay | Admitting: Family Medicine

## 2018-05-05 DIAGNOSIS — H0013 Chalazion right eye, unspecified eyelid: Secondary | ICD-10-CM | POA: Diagnosis not present

## 2018-05-14 DIAGNOSIS — H0013 Chalazion right eye, unspecified eyelid: Secondary | ICD-10-CM | POA: Diagnosis not present

## 2018-05-22 ENCOUNTER — Other Ambulatory Visit: Payer: Self-pay

## 2018-05-22 ENCOUNTER — Emergency Department: Payer: Medicare HMO

## 2018-05-22 ENCOUNTER — Emergency Department
Admission: EM | Admit: 2018-05-22 | Discharge: 2018-05-22 | Disposition: A | Discharge status: Home / Self Care | Payer: Medicare HMO | Attending: Emergency Medicine | Admitting: Emergency Medicine

## 2018-05-22 ENCOUNTER — Ambulatory Visit: Payer: Self-pay

## 2018-05-22 DIAGNOSIS — Z7982 Long term (current) use of aspirin: Secondary | ICD-10-CM | POA: Insufficient documentation

## 2018-05-22 DIAGNOSIS — M545 Low back pain, unspecified: Secondary | ICD-10-CM

## 2018-05-22 DIAGNOSIS — Z91040 Latex allergy status: Secondary | ICD-10-CM | POA: Insufficient documentation

## 2018-05-22 DIAGNOSIS — Z794 Long term (current) use of insulin: Secondary | ICD-10-CM | POA: Diagnosis not present

## 2018-05-22 DIAGNOSIS — J189 Pneumonia, unspecified organism: Secondary | ICD-10-CM | POA: Diagnosis not present

## 2018-05-22 DIAGNOSIS — Z79899 Other long term (current) drug therapy: Secondary | ICD-10-CM | POA: Diagnosis not present

## 2018-05-22 DIAGNOSIS — N200 Calculus of kidney: Secondary | ICD-10-CM | POA: Diagnosis not present

## 2018-05-22 DIAGNOSIS — E119 Type 2 diabetes mellitus without complications: Secondary | ICD-10-CM | POA: Insufficient documentation

## 2018-05-22 DIAGNOSIS — R109 Unspecified abdominal pain: Secondary | ICD-10-CM | POA: Insufficient documentation

## 2018-05-22 DIAGNOSIS — J181 Lobar pneumonia, unspecified organism: Secondary | ICD-10-CM | POA: Insufficient documentation

## 2018-05-22 DIAGNOSIS — I10 Essential (primary) hypertension: Secondary | ICD-10-CM | POA: Diagnosis not present

## 2018-05-22 DIAGNOSIS — K5730 Diverticulosis of large intestine without perforation or abscess without bleeding: Secondary | ICD-10-CM | POA: Diagnosis not present

## 2018-05-22 LAB — URINALYSIS, COMPLETE (UACMP) WITH MICROSCOPIC
BACTERIA UA: NONE SEEN
BILIRUBIN URINE: NEGATIVE
Glucose, UA: NEGATIVE mg/dL
Hgb urine dipstick: NEGATIVE
KETONES UR: NEGATIVE mg/dL
Nitrite: NEGATIVE
Protein, ur: NEGATIVE mg/dL
Specific Gravity, Urine: 1.013 (ref 1.005–1.030)
pH: 5 (ref 5.0–8.0)

## 2018-05-22 MED ORDER — AZITHROMYCIN 250 MG PO TABS: ORAL_TABLET | ORAL | 0 refills | Status: DC

## 2018-05-22 MED ORDER — OXYCODONE HCL 5 MG PO TABS: 5.0000 mg | ORAL_TABLET | Freq: Three times a day (TID) | ORAL | 0 refills | Status: DC | PRN

## 2018-05-22 MED ORDER — OXYCODONE HCL 5 MG PO TABS
5.0000 mg | ORAL_TABLET | Freq: Once | ORAL | Status: AC
  Administered 2018-05-22: 5 mg via ORAL
  Filled 2018-05-22: qty 1

## 2018-05-22 NOTE — Telephone Encounter (Signed)
She needs to be seen in our office next week and we can decide , she can go to [facility] walk in if she thinks muscular problem

## 2018-05-22 NOTE — Telephone Encounter (Signed)
Patient called and said she said she just left the emergency room and they diagnosed her with pneumonia on the left side. She is having a hard time believing that is what is wrong with her. She said they sent her home with a zpack. She said that she has lower back pain on the right side radiating to the front and she thought she had a kidney stone. She said she could hardly get out of bed this morning. They did give her something for pain. She thinks that she needs to just see an orthopedic.Patient advised from the [facility] she most likely needs to make an appointment with Dr Ancil Boozer to follow up from the ED visit. ED visit today.  Reason for Disposition . [1] Follow-up call from patient regarding patient's clinical status AND [2] information NON-URGENT  Protocols used: PCP CALL - NO TRIAGE-A-AH

## 2018-05-22 NOTE — ED Notes (Signed)
FIRST NURSE NOTE: Pt ambulatory in lobby, c/o back pain, states "it may be a kidney stone"

## 2018-05-22 NOTE — ED Notes (Signed)
Resting better at present   Family at bedside   Awaiting results

## 2018-05-22 NOTE — ED Notes (Signed)
See triage note  Presents with lower back pain for a few days  States pain is mainly on the right side  Does not radiate into legs  Ambulates well

## 2018-05-22 NOTE — ED Triage Notes (Signed)
States low back pain. Denies injury. Denies urinary symptoms. States "minor pain on and off" but worse today.   A&O, ambulatory.

## 2018-05-22 NOTE — Telephone Encounter (Signed)
Patient states due to her Insurance she will have to have a referral to go to the Orthopedic doctors. She would like to come in next week as the first patient.

## 2018-05-22 NOTE — ED Provider Notes (Signed)
[facility] Emergency Department Provider Note ____________________________________________  Time seen: Approximately 9:07 AM  I have reviewed the triage vital signs and the nursing notes.  HISTORY  Chief Complaint Back Pain   HPI Christine Roberts is a 72 y.o. female who presents to the emergency department for treatment and evaluation of back pain.  Patient states that she has chronic back pain but over the past day it has "settled on the right side."  She is concerned that she may have a kidney stone as she has had multiple stones in the past that have required lithotripsy.  She also states that she is been working outside in her yard but does not recall pulling anything or injuring herself.  No relief with arthritis medication.  Past Medical History:  Diagnosis Date  . Diabetes mellitus without complication ([place])   . Hyperlipidemia   . Hypertension   . Kidney stone   . Left shoulder pain   . Obesity   . Osteoarthritis   . Ovarian failure   . Stroke ([place])   . Vitamin B12 deficiency (non anemic)     Patient Active Problem List   Diagnosis Date Noted  . Seasonal allergic rhinitis 09/12/2015  . TIA (transient ischemic attack) 05/24/2015  . Senile purpura ([place]) 05/09/2015  . Benign essential HTN 03/18/2015  . Pain in shoulder 03/18/2015  . Dyslipidemia 03/18/2015  . History of surgery to major organs, presenting hazards to health 03/18/2015  . Hearing loss 03/18/2015  . Hiatal hernia 03/18/2015  . History of colon polyps 03/18/2015  . Calculus of kidney 03/18/2015  . B12 deficiency 03/18/2015  . Microalbuminuria 03/18/2015  . Adult BMI 30+ 03/18/2015  . Arthritis, degenerative 03/18/2015  . Diabetes mellitus with nephropathy ([place]) 03/18/2015    Past Surgical History:  Procedure Laterality Date  . ABDOMINAL HYSTERECTOMY    . CHOLECYSTECTOMY    . COLONOSCOPY  2015  . COLONOSCOPY WITH PROPOFOL N/A 03/04/2018   Procedure: COLONOSCOPY WITH PROPOFOL;   Surgeon: Toledo, Benay Pike, MD;  Location: [facility] ENDOSCOPY;  Service: Gastroenterology;  Laterality: N/A;  . cystoscopic    . LITHOTRIPSY  12/2007  . MYRINGOTOMY Right 11/2009    Prior to Admission medications   Medication Sig Start Date End Date Taking? Authorizing Provider  ACCU-CHEK AVIVA PLUS test strip TEST BLOOD SUGAR UP TO FOUR TIMES DAILY AS DIRECTED 03/01/18   Ancil Boozer, Drue Stager, MD  ACCU-CHEK SOFTCLIX LANCETS lancets 1 each by Other route daily. 08/26/17   [provider]  aspirin EC 81 MG tablet Take 1 tablet (81 mg total) by mouth daily. 03/21/15   Steele Sizer, MD  atorvastatin (LIPITOR) 40 MG tablet Take 1 tablet (40 mg total) by mouth at bedtime. 03/30/18   Steele Sizer, MD  azithromycin (ZITHROMAX) 250 MG tablet 2 tablets today, then 1 tablet for the next 4 days. 05/22/18   Mychelle Kendra, Johnette Abraham B, FNP  blood glucose meter kit and supplies KIT Dispense based on patient and insurance preference. Use up to four times daily as directed. (FOR ICD-9 250.00, 250.01). 08/22/17   Steele Sizer, MD  calcium citrate (CALCITRATE - DOSED IN MG ELEMENTAL CALCIUM) 950 MG tablet Take 200 mg of elemental calcium by mouth daily.    [provider]  fluconazole (DIFLUCAN) 150 MG tablet Take 1 tablet (150 mg total) by mouth every other day. 03/30/18   Steele Sizer, MD  fluticasone (FLONASE) 50 MCG/ACT nasal spray Place 2 sprays into both nostrils daily. 08/08/15   Sowles, Drue Stager,  MD  Insulin Degludec (TRESIBA FLEXTOUCH) 200 UNIT/ML SOPN Inject 50 Units into the skin daily. 11/17/17   Steele Sizer, MD  Insulin Pen Needle (NOVOFINE) 30G X 8 MM MISC Inject 10 each into the skin as needed. 11/02/15   Steele Sizer, MD  Lancet Devices MISC 1 Units by Does not apply route 2 (two) times daily. 10/27/15   Steele Sizer, MD  lisinopril (PRINIVIL,ZESTRIL) 20 MG tablet Take 1 tablet (20 mg total) by mouth daily. 03/30/18   Steele Sizer, MD  metFORMIN (GLUCOPHAGE-XR) 750 MG 24 hr tablet Take  1 tablet (750 mg total) by mouth daily with breakfast. 03/30/18   Steele Sizer, MD  oxyCODONE (ROXICODONE) 5 MG immediate release tablet Take 1 tablet (5 mg total) by mouth every 8 (eight) hours as needed for severe pain. 05/22/18 05/22/19  Nikeia Henkes, Johnette Abraham B, FNP  polyethylene glycol powder (GLYCOLAX/MIRALAX) powder Take 17 g by mouth 2 (two) times daily as needed. 11/17/17   Steele Sizer, MD    Allergies Latex and Erythromycin  Family History  Problem Relation Age of Onset  . Cancer Mother   . Diabetes Mother   . Diabetes Daughter   . CAD Father   . Diabetes Daughter        hypoglycemia    Social History Social History   Tobacco Use  . Smoking status: Never Smoker  . Smokeless tobacco: Never Used  . Tobacco comment: smoking cessation materials not required  Substance Use Topics  . Alcohol use: Yes    Alcohol/week: 1.0 standard drinks    Types: 1 Shots of liquor per week    Comment: socially  . Drug use: No    Review of Systems Constitutional: Well appearing. Respiratory: Negative for dyspnea. Cardiovascular: Negative for change in skin temperature or color. Musculoskeletal:   Negative for chronic steroid use   Negative for trauma in the presence of osteoporosis  Negative for age over 44 and trauma.  Negative for constitutional symptoms, or history of cancer.  Negative for pain worse at night. Skin:   Negative for rash, lesion, or wound.  Genitourinary: Negative for urinary retention.  Negative for dysuria Rectal: Negative for fecal incontinence or new onset constipation/bowel habit changes. Hematological/Immunilogical: Negative for immunosuppression, IV drug use, or fever Neurological: Negative for burning, tingling, numb, electric, radiating pain in the.                        Negative for saddle anesthesia.                        Negative for focal neurologic deficit, progressive or disabling symptoms             Negative for saddle  anesthesia. ____________________________________________   PHYSICAL EXAM:  VITAL SIGNS: ED Triage Vitals  Enc Vitals Group     BP 05/22/18 0759 (!) 156/88     Pulse Rate 05/22/18 0759 82     Resp --      Temp 05/22/18 0759 97.6 F (36.4 C)     Temp Source 05/22/18 0759 Oral     SpO2 05/22/18 0759 98 %     Weight 05/22/18 0801 190 lb (86.2 kg)     Height 05/22/18 0801 _0  (1.6 m)     Head Circumference --      Peak Flow --      Pain Score 05/22/18 0801 6     Pain Loc --  Pain Edu? --      Excl. in [place]? --     Constitutional: Alert and oriented. Well appearing and in no acute distress. Eyes: Conjunctivae are clear without discharge or drainage.  Head: Atraumatic. Neck: Full, active range of motion. Respiratory: Respirations even and unlabored. Musculoskeletal: Full ROM of the back and extremities, Strength 5/5 of the lower extremities as tested.  No CVA tenderness Neurologic: Reflexes of the lower extremities are 2+.  Negative straight leg raise on the right or left side. Skin: Atraumatic.  Psychiatric: Behavior and affect are normal.  ____________________________________________   LABS (all labs ordered are listed, but only abnormal results are displayed)  Labs Reviewed  URINALYSIS, COMPLETE (UACMP) WITH MICROSCOPIC - Abnormal; Notable for the following components:      Result Value   Color, Urine YELLOW (*)    APPearance CLEAR (*)    Leukocytes, UA TRACE (*)    All other components within normal limits  URINE CULTURE   ____________________________________________  RADIOLOGY  X-ray of the lumbar spine is negative for acute findings.  She does have some degenerative disease in the lower lumbar and degenerative endplate disease but otherwise no acute findings.  CT of the abdomen and pelvis for stone is negative for urethral lithiasis.  It does show an area of airspace consolidation that is consistent with pneumonia in the posterior segment of the lower  lobe. ____________________________________________   PROCEDURES  Procedure(s) performed:  Procedures ____________________________________________   INITIAL IMPRESSION / ASSESSMENT AND PLAN / ED COURSE  Christine Roberts is a 72 y.o. female presenting to the emergency department for treatment and evaluation of back pain.  Patient's pain is on the right side.  CT does not show a kidney stones on the right side.  The patient has no hematuria.  CT does show an airspace consolidation in the left lower lobe that is concerning for pneumonia.  The patient denies any recent cough, fever or other URI symptoms. She will be treated with azithromycin and a few days of roxicet for her back pain. She was given Roxicet in the ER without adverse reaction. She was warned that it may cause dizziness or drowsiness.   Medications  oxyCODONE (Oxy IR/ROXICODONE) immediate release tablet 5 mg (5 mg Oral Given 05/22/18 8421)    ED Discharge Orders         Ordered    azithromycin (ZITHROMAX) 250 MG tablet     05/22/18 1131    oxyCODONE (ROXICODONE) 5 MG immediate release tablet  Every 8 hours PRN     05/22/18 1131           Pertinent labs & imaging results that were available during my care of the patient were reviewed by me and considered in my medical decision making (see chart for details).  _________________________________________   FINAL CLINICAL IMPRESSION(S) / ED DIAGNOSES  Final diagnoses:  Acute lumbar back pain  Community acquired pneumonia of left lower lobe of lung ([place])     If controlled substance prescribed during this visit, 12 month history viewed on the [place] prior to issuing an initial prescription for Schedule II or III opiod.    Victorino Dike, FNP 05/22/18 1424    Lisa Roca, MD 05/22/18 [phone]

## 2018-05-23 LAB — URINE CULTURE: Culture: <10000 — AB

## 2018-05-24 NOTE — Telephone Encounter (Signed)
Can you please see when patient can be added for this week ?

## 2018-05-25 ENCOUNTER — Encounter: Payer: Self-pay | Admitting: Family Medicine

## 2018-05-25 ENCOUNTER — Ambulatory Visit (INDEPENDENT_AMBULATORY_CARE_PROVIDER_SITE_OTHER): Payer: Medicare HMO | Admitting: Family Medicine

## 2018-05-25 ENCOUNTER — Telehealth: Payer: Self-pay | Admitting: Family Medicine

## 2018-05-25 VITALS — BP 106/62 | HR 110 | Temp 98.2°F | Resp 16 | Ht 63.0 in | Wt 194.7 lb

## 2018-05-25 DIAGNOSIS — R1030 Lower abdominal pain, unspecified: Secondary | ICD-10-CM

## 2018-05-25 DIAGNOSIS — M5136 Other intervertebral disc degeneration, lumbar region: Secondary | ICD-10-CM | POA: Diagnosis not present

## 2018-05-25 DIAGNOSIS — E1121 Type 2 diabetes mellitus with diabetic nephropathy: Secondary | ICD-10-CM | POA: Diagnosis not present

## 2018-05-25 DIAGNOSIS — M51369 Other intervertebral disc degeneration, lumbar region without mention of lumbar back pain or lower extremity pain: Secondary | ICD-10-CM

## 2018-05-25 DIAGNOSIS — J181 Lobar pneumonia, unspecified organism: Secondary | ICD-10-CM | POA: Diagnosis not present

## 2018-05-25 DIAGNOSIS — N200 Calculus of kidney: Secondary | ICD-10-CM | POA: Diagnosis not present

## 2018-05-25 DIAGNOSIS — J189 Pneumonia, unspecified organism: Secondary | ICD-10-CM

## 2018-05-25 DIAGNOSIS — I700 Atherosclerosis of aorta: Secondary | ICD-10-CM | POA: Diagnosis not present

## 2018-05-25 LAB — COMPLETE METABOLIC PANEL WITH GFR
AG RATIO: 1.4 (calc) (ref 1.0–2.5)
ALBUMIN MSPROF: 3.9 g/dL (ref 3.6–5.1)
ALKALINE PHOSPHATASE (APISO): 64 U/L (ref 33–130)
ALT: 14 U/L (ref 6–29)
AST: 13 U/L (ref 10–35)
BILIRUBIN TOTAL: 0.5 mg/dL (ref 0.2–1.2)
BUN: 13 mg/dL (ref 7–25)
CHLORIDE: 103 mmol/L (ref 98–110)
CO2: 27 mmol/L (ref 20–32)
Calcium: 10 mg/dL (ref 8.6–10.4)
Creat: 0.79 mg/dL (ref 0.60–0.93)
GFR, EST AFRICAN AMERICAN: 86 mL/min/{1.73_m2} (ref >=60)
GFR, Est Non African American: 74 mL/min/{1.73_m2} (ref >=60)
GLOBULIN: 2.7 g/dL (ref 1.9–3.7)
Glucose, Bld: 104 mg/dL (ref 65–139)
POTASSIUM: 4.2 mmol/L (ref 3.5–5.3)
SODIUM: 140 mmol/L (ref 135–146)
Total Protein: 6.6 g/dL (ref 6.1–8.1)

## 2018-05-25 LAB — CBC WITH DIFFERENTIAL/PLATELET
BASOS ABS: 44 {cells}/uL (ref 0–200)
Basophils Relative: 0.5 %
EOS PCT: 1.7 %
Eosinophils Absolute: 148 cells/uL (ref 15–500)
HEMATOCRIT: 35 % (ref 35.0–45.0)
Hemoglobin: 11.7 g/dL (ref 11.7–15.5)
LYMPHS ABS: 2010 {cells}/uL (ref 850–3900)
MCH: 27.9 pg (ref 27.0–33.0)
MCHC: 33.4 g/dL (ref 32.0–36.0)
MCV: 83.3 fL (ref 80.0–100.0)
MONOS PCT: 6 %
MPV: 10.2 fL (ref 7.5–12.5)
NEUTROS PCT: 68.7 %
Neutro Abs: 5977 cells/uL (ref 1500–7800)
Platelets: 293 10*3/uL (ref 140–400)
RBC: 4.2 10*6/uL (ref 3.80–5.10)
RDW: 13.2 % (ref 11.0–15.0)
Total Lymphocyte: 23.1 %
WBC mixed population: 522 cells/uL (ref 200–950)
WBC: 8.7 10*3/uL (ref 3.8–10.8)

## 2018-05-25 MED ORDER — PREDNISONE 10 MG (48) PO TBPK: ORAL_TABLET | ORAL | 0 refills | Status: DC

## 2018-05-25 MED ORDER — OXYCODONE HCL 5 MG PO TABS: 5.0000 mg | ORAL_TABLET | Freq: Three times a day (TID) | ORAL | 0 refills | Status: DC | PRN

## 2018-05-25 NOTE — Progress Notes (Signed)
Name: Christine Roberts   MRN: [id number]    DOB: 01-Dec-1945   Date:05/25/2018       Progress Note  Subjective  Chief Complaint  Chief Complaint  Patient presents with  . Back Pain    Onset-2 weeks, has been very stiff and hurting- get butter once she is moving. She went to the ER and was dx Pneumonia. But states the pain bothers her would like a referral to Ortho  . Pneumonia    Diagnosis Friday in the hospital-no symptoms started her on Z-pak. Has 2 left    HPI  CAP: she went to [facility] on Friday because of lower back pain ( right lower side) unable to walk and thought was kidney stone, reviewed CT and found to have CAP on left lower lobe, she states she only had a mild cough , she was given Z-pack and is still taking it. No fever or chills.   Kidney stone: used to see Dr. Jacqlyn Larsen until he moved. Last time she passed a kidney stone was a couple of years ago , improved when she started to take calcium citrate. She states this pain was similar to when she had kidney stone, except that now movement can cause shooting on her mid back, and radiates to both flanks. She states she has been feeling bloated also. She has difficulty getting out of bed. No hematuria , urine culture at [facility] was negative. No fever or chills. She has noticed weak urinary stream, hesitancy.   DDD lumbar spine: on X-ray, she states feeling very stiff , worse in am's , difficulty getting out of bed, taking pain medication given by EC. She states back pain has been going on for weeks, worse after after raked her yard last week but improved until Friday when got much worse. No fall. No rashes, no bowel or bladder incontinence .   DM: last hgbA1C was elevated, we adjusted her medication, states glucose at home has been controlled 80-120's. Explained prednisone will raise glucose values and we will need to adjust Tresiba dose  Atherosclerosis aorta: seen on CT renal stone protocol, she is on statin therapy and aspirin, discussed results with  patient   Patient Active Problem List   Diagnosis Date Noted  . Seasonal allergic rhinitis 09/12/2015  . TIA (transient ischemic attack) 05/24/2015  . Senile purpura ([place]) 05/09/2015  . Benign essential HTN 03/18/2015  . Pain in shoulder 03/18/2015  . Dyslipidemia 03/18/2015  . History of surgery to major organs, presenting hazards to health 03/18/2015  . Hearing loss 03/18/2015  . Hiatal hernia 03/18/2015  . History of colon polyps 03/18/2015  . Calculus of kidney 03/18/2015  . B12 deficiency 03/18/2015  . Microalbuminuria 03/18/2015  . Adult BMI 30+ 03/18/2015  . Arthritis, degenerative 03/18/2015  . Diabetes mellitus with nephropathy ([place]) 03/18/2015    Past Surgical History:  Procedure Laterality Date  . ABDOMINAL HYSTERECTOMY    . CHOLECYSTECTOMY    . COLONOSCOPY  2015  . COLONOSCOPY WITH PROPOFOL N/A 03/04/2018   Procedure: COLONOSCOPY WITH PROPOFOL;  Surgeon: Toledo, Benay Pike, MD;  Location: [facility] ENDOSCOPY;  Service: Gastroenterology;  Laterality: N/A;  . cystoscopic    . LITHOTRIPSY  12/2007  . MYRINGOTOMY Right 11/2009    Family History  Problem Relation Age of Onset  . Cancer Mother   . Diabetes Mother   . Diabetes Daughter   . CAD Father   . Diabetes Daughter        hypoglycemia  Social History   Socioeconomic History  . Marital status: Widowed    Spouse name: Not on file  . Number of children: 2  . Years of education: Not on file  . Highest education level: Associate degree: occupational, Hotel manager, or vocational program  Occupational History    Employer: [employer]  Social Needs  . Financial resource strain: Somewhat hard  . Food insecurity:    Worry: Never true    Inability: Never true  . Transportation needs:    Medical: No    Non-medical: No  Tobacco Use  . Smoking status: Never Smoker  . Smokeless tobacco: Never Used  . Tobacco comment: smoking cessation materials not required  Substance and Sexual Activity  .  Alcohol use: Yes    Alcohol/week: 1.0 standard drinks    Types: 1 Shots of liquor per week    Comment: socially  . Drug use: No  . Sexual activity: Not Currently  Lifestyle  . Physical activity:    Days per week: 3 days    Minutes per session: 60 min  . Stress: Not at all  Relationships  . Social connections:    Talks on phone: Patient refused    Gets together: Patient refused    Attends religious service: Patient refused    Active member of club or organization: Patient refused    Attends meetings of clubs or organizations: Patient refused    Relationship status: Widowed  . Intimate partner violence:    Fear of current or ex partner: No    Emotionally abused: No    Physically abused: No    Forced sexual activity: No  Other Topics Concern  . Not on file  Social History Narrative  . Not on file     Current Outpatient Medications:  .  ACCU-CHEK AVIVA PLUS test strip, TEST BLOOD SUGAR UP TO FOUR TIMES DAILY AS DIRECTED, Disp: 100 each, Rfl: 0 .  ACCU-CHEK SOFTCLIX LANCETS lancets, 1 each by Other route daily., Disp: , Rfl:  .  aspirin EC 81 MG tablet, Take 1 tablet (81 mg total) by mouth daily., Disp: 90 tablet, Rfl: 0 .  atorvastatin (LIPITOR) 40 MG tablet, Take 1 tablet (40 mg total) by mouth at bedtime., Disp: 12 tablet, Rfl: 0 .  azithromycin (ZITHROMAX) 250 MG tablet, 2 tablets today, then 1 tablet for the next 4 days., Disp: 6 each, Rfl: 0 .  blood glucose meter kit and supplies KIT, Dispense based on patient and insurance preference. Use up to four times daily as directed. (FOR ICD-9 250.00, 250.01)., Disp: 1 each, Rfl: 0 .  calcium citrate (CALCITRATE - DOSED IN MG ELEMENTAL CALCIUM) 950 MG tablet, Take 200 mg of elemental calcium by mouth daily., Disp: , Rfl:  .  fluticasone (FLONASE) 50 MCG/ACT nasal spray, Place 2 sprays into both nostrils daily., Disp: 16 g, Rfl: 6 .  Insulin Degludec (TRESIBA FLEXTOUCH) 200 UNIT/ML SOPN, Inject 50 Units into the skin daily., Disp: 27  mL, Rfl: 0 .  Insulin Pen Needle (NOVOFINE) 30G X 8 MM MISC, Inject 10 each into the skin as needed., Disp: 100 each, Rfl: 2 .  Lancet Devices MISC, 1 Units by Does not apply route 2 (two) times daily., Disp: 100 each, Rfl: 3 .  lisinopril (PRINIVIL,ZESTRIL) 20 MG tablet, Take 1 tablet (20 mg total) by mouth daily., Disp: 90 tablet, Rfl: 1 .  metFORMIN (GLUCOPHAGE-XR) 750 MG 24 hr tablet, Take 1 tablet (750 mg total) by mouth daily with breakfast.,  Disp: 180 tablet, Rfl: 1 .  neomycin-polymyxin b-dexamethasone (MAXITROL) 3.5-10000-0.1 OINT, APPLY A SMALL AMOUNT TO RIGHT UPPER LID MARGIN TWICE A DAY FOLLOWING HOT COMPRESSES, Disp: , Rfl: 99 .  oxyCODONE (ROXICODONE) 5 MG immediate release tablet, Take 1 tablet (5 mg total) by mouth every 8 (eight) hours as needed for severe pain., Disp: 12 tablet, Rfl: 0 .  polyethylene glycol powder (GLYCOLAX/MIRALAX) powder, Take 17 g by mouth 2 (two) times daily as needed., Disp: 3350 g, Rfl: 1 .  fluconazole (DIFLUCAN) 150 MG tablet, Take 1 tablet (150 mg total) by mouth every other day. (Patient not taking: Reported on 05/25/2018), Disp: 3 tablet, Rfl: 0 .  predniSONE (STERAPRED UNI-PAK 48 TAB) 10 MG (48) TBPK tablet, Take as directed, Disp: 48 tablet, Rfl: 0  Allergies  Allergen Reactions  . Latex Rash  . Erythromycin Other (See Comments)    Strawberry tongue    I personally reviewed active problem list, medication list, allergies, family history, social history with the patient/caregiver today.   ROS  Constitutional: Negative for fever or weight change.  Respiratory: Negative for cough and shortness of breath.   Cardiovascular: Negative for chest pain or palpitations.  Gastrointestinal: Negative for abdominal pain, no bowel changes.  Musculoskeletal:Positive for gait problem but no joint swelling.  Skin: Negative for rash.  Neurological: Negative for dizziness or headache.  No other specific complaints in a complete review of systems (except as  listed in HPI above).  Objective  Vitals:   05/25/18 0856  BP: 106/62  Pulse: (!) 110  Resp: 16  Temp: 98.2 F (36.8 C)  TempSrc: Oral  SpO2: 98%  Weight: 194 lb 11.2 oz (88.3 kg)  Height: 5' 3" (1.6 m)    Body mass index is 34.49 kg/m.  Physical Exam  Constitutional: Patient appears well-developed and well-nourished. Obese No distress.  HEENT: head atraumatic, normocephalic, pupils equal and reactive to light,  neck supple, throat within normal limits Cardiovascular: Normal rate, regular rhythm and normal heart sounds.  No murmur heard. No BLE edema. Pulmonary/Chest: Effort normal and breath sounds normal. No respiratory distress. Abdominal: Soft.  There is mild lower abdominal tenderness. Muscular skeletal: pain during palpation of lumbar spine, negative straight leg raise, negative CVA tenderness.  Psychiatric: Patient has a normal mood and affect. behavior is normal. Judgment and thought content normal.  Recent Results (from the past 2160 hour(s))  Glucose, capillary     Status: Abnormal   Collection Time: 03/04/18  7:33 AM  Result Value Ref Range   Glucose-Capillary 100 (H) 70 - 99 mg/dL  Surgical pathology     Status: None   Collection Time: 03/04/18  8:10 AM  Result Value Ref Range   SURGICAL PATHOLOGY      Surgical Pathology CASE: [id number] PATIENT: Reene Browder Surgical Pathology Report     SPECIMEN SUBMITTED: A. Colon polyp x2, cecum; cbx B. Colon polyps x2, ascending; cbx  CLINICAL HISTORY: None provided  PRE-OPERATIVE DIAGNOSIS: PHX colon polyps, hemorrhoids  POST-OPERATIVE DIAGNOSIS: None provided.     DIAGNOSIS: A.  COLON POLYP X 2, CECUM; COLD BIOPSY: - TUBULAR ADENOMA, NEGATIVE FOR HIGH-GRADE DYSPLASIA AND MALIGNANCY. - FRAGMENT WITH PROMINENT LYMPHOID AGGREGATE.  B.  COLON POLYPS X 2, ASCENDING; COLD BIOPSY: - TUBULAR ADENOMAS, 2 FRAGMENTS. - NEGATIVE FOR HIGH-GRADE DYSPLASIA AND MALIGNANCY.   GROSS DESCRIPTION: A. Labeled:  Cbx cecal polyp in colon x2 Received: In formalin Tissue fragment(s): 2 Size: 0.5 and 0.6 cm Description: Pink-tan fragments Entirely submitted in one cassette.  B. Labeled: Cbx ascending colon polyps x2 Received: In formalin Tissue fragment(s): 2 Size: 0.3-0.4 cm Description: Pink-tan fragments Entirely submi tted in one cassette.   Final Diagnosis performed by Bryan Lemma, MD.   Electronically signed 03/06/2018 3:09:53PM The electronic signature indicates that the named Attending Pathologist has evaluated the specimen  Technical component performed at [facility], [address] [place], [place] [address] Lab: [phone] Dir: Rush Farmer, MD, MMM  Professional component performed at [facility], [facility], [place], [place], [place] [address] Lab: [phone] Dir: Dellia Nims. Rubinas, MD   POCT HgB A1C     Status: Abnormal   Collection Time: 03/30/18  4:05 PM  Result Value Ref Range   Hemoglobin A1C     HbA1c POC (<> result, manual entry)     HbA1c, POC (prediabetic range)     HbA1c, POC (controlled diabetic range) 8.1 (A) 0.0 - 7.0 %  HM DIABETES EYE EXAM     Status: None   Collection Time: 04/01/18 12:00 AM  Result Value Ref Range   HM Diabetic Eye Exam No Retinopathy No Retinopathy    Comment: [facility], Dr. Sandra Cockayne  Urinalysis, Complete w Microscopic     Status: Abnormal   Collection Time: 05/22/18  8:17 AM  Result Value Ref Range   Color, Urine YELLOW (A) YELLOW   APPearance CLEAR (A) CLEAR   Specific Gravity, Urine 1.013 1.005 - 1.030   pH 5.0 5.0 - 8.0   Glucose, UA NEGATIVE NEGATIVE mg/dL   Hgb urine dipstick NEGATIVE NEGATIVE   Bilirubin Urine NEGATIVE NEGATIVE   Ketones, ur NEGATIVE NEGATIVE mg/dL   Protein, ur NEGATIVE NEGATIVE mg/dL   Nitrite NEGATIVE NEGATIVE   Leukocytes, UA TRACE (A) NEGATIVE   RBC / HPF 0-5 0 - 5 RBC/hpf   WBC, UA 0-5 0 - 5 WBC/hpf   Bacteria, UA NONE SEEN NONE SEEN   Squamous Epithelial / LPF 0-5 0 - 5    Mucus PRESENT     Comment: Performed at [facility], [address] [place], [place] [address]  Urine culture     Status: Abnormal   Collection Time: 05/22/18  8:17 AM  Result Value Ref Range   Specimen Description      URINE, RANDOM Performed at [facility], [address], [place], [place] [address]    Special Requests      NONE Performed at [facility], [address] [place], [place] [address]    Culture (A)     <10,000 COLONIES/mL INSIGNIFICANT GROWTH Performed at [facility], [address], [place], [place] [address]    Report Status 05/23/2018 FINAL       PHQ2/9: Depression screen [facility] 2/9 03/30/2018 07/18/2017 07/18/2017 07/14/2017 01/10/2017  Decreased Interest 0 0 0 0 0  Down, Depressed, Hopeless 0 0 0 0 0  PHQ - 2 Score 0 0 0 0 0  Altered sleeping - 0 - - -  Tired, decreased energy - 0 - - -  Change in appetite - 0 - - -  Feeling bad or failure about yourself  - 0 - - -  Trouble concentrating - 0 - - -  Moving slowly or fidgety/restless - 0 - - -  Suicidal thoughts - 0 - - -  PHQ-9 Score - 0 - - -  Difficult doing work/chores - Not difficult at all - - -     Fall Risk: Fall Risk  03/30/2018 12/25/2017 11/17/2017 07/18/2017 07/14/2017  Falls in the past year? _0   Number falls in past yr: - - - - -  Injury with Fall? - - - - -  Risk for fall due to : - - - - -  Follow up - - - - -     Assessment & Plan  1. Kidney stone on right side  - Ambulatory referral to Urology  2. Degenerative disc disease, lumbar  - Ambulatory referral to Orthopedic Surgery - predniSONE (STERAPRED UNI-PAK 48 TAB) 10 MG (48) TBPK tablet; Take as directed  Dispense: 48 tablet; Refill: 0  3. Lower abdominal pain  It can be from passing kidney stone or back pain  - COMPLETE METABOLIC PANEL WITH GFR - predniSONE (STERAPRED UNI-PAK 48 TAB) 10 MG (48) TBPK tablet; Take as directed  Dispense: 48 tablet; Refill: 0  4. Community  acquired pneumonia of left lower lobe of lung ([place])  Still taking zpack  - COMPLETE METABOLIC PANEL WITH GFR - CBC with Differential/Platelet  5. Type 2 diabetes with nephropathy ([facility])  Explained prednisone will raise glucose level, needs to be compliant with diet and monitor glucose daily   6. Atherosclerosis of aorta ([facility])  On statin and aspirin

## 2018-05-25 NOTE — Telephone Encounter (Signed)
Pt is coming in on Dec 29th for follow up to pneumonia per tiffany

## 2018-05-25 NOTE — Telephone Encounter (Signed)
Copied from [place] [phone]. Topic: Quick Communication - See Telephone Encounter >> May 25, 2018  2:03 PM Vernona Rieger wrote:   CRM for notification. See Telephone encounter for: 05/25/18.  Patient states she was in the office today and she thought that Dr Ancil Boozer was going to call her in some pain medication (oxyCODONE (ROXICODONE) 5 MG immediate release tablet).  Patient states the pharmacy only has prednisone there for her. Please advise  [place] [address], [place] - [place] [address] [place] [address]

## 2018-05-28 DIAGNOSIS — M545 Low back pain: Secondary | ICD-10-CM | POA: Diagnosis not present

## 2018-06-01 ENCOUNTER — Ambulatory Visit: Payer: Medicare HMO | Admitting: Family Medicine

## 2018-06-02 ENCOUNTER — Encounter: Payer: Self-pay | Admitting: Family Medicine

## 2018-06-02 ENCOUNTER — Ambulatory Visit: Payer: Medicare HMO | Admitting: Family Medicine

## 2018-06-02 VITALS — BP 118/70 | HR 101 | Temp 98.5°F | Resp 18 | Ht 63.0 in | Wt 194.8 lb

## 2018-06-02 DIAGNOSIS — E1121 Type 2 diabetes mellitus with diabetic nephropathy: Secondary | ICD-10-CM | POA: Diagnosis not present

## 2018-06-02 DIAGNOSIS — M5136 Other intervertebral disc degeneration, lumbar region: Secondary | ICD-10-CM

## 2018-06-02 DIAGNOSIS — J189 Pneumonia, unspecified organism: Secondary | ICD-10-CM

## 2018-06-02 DIAGNOSIS — M51369 Other intervertebral disc degeneration, lumbar region without mention of lumbar back pain or lower extremity pain: Secondary | ICD-10-CM

## 2018-06-02 DIAGNOSIS — J181 Lobar pneumonia, unspecified organism: Secondary | ICD-10-CM

## 2018-06-02 NOTE — Progress Notes (Signed)
Name: Christine Roberts   MRN: [id number]    DOB: Sep 01, 1945   Date:06/02/2018       Progress Note  Subjective  Chief Complaint  Chief Complaint  Patient presents with  . Follow-up  . Pneumonia    States her pain is gone and just has a very little wheezing    HPI  CAP: diagnosed when she went to [facility] for acute low back pain on 05/22/2018, CT was done to rule out kidney stone. She was given a Zpack, she still has mild wheezing and occasional cough but no SOB.   Acute on chronic low back pain; initially the pain was very intense that caused her to go to [facility], it was not secondary to kidney stone, she saw neurosugery PA at [facility] and was already improving with prednisone taper, they added muscle relaxer, she states it causes her to feel sleepy , but at night has a hard time sleeping secondary to prednisone. She is feeling much better but not ready to return to work, we will release her next week. She still has a few oxycodones that she takes prn   DM: she denies polyphagia, polydipsia and polyuria, checking glucose.    Patient Active Problem List   Diagnosis Date Noted  . Atherosclerosis of aorta ([place]) 05/25/2018  . Seasonal allergic rhinitis 09/12/2015  . TIA (transient ischemic attack) 05/24/2015  . Senile purpura ([place]) 05/09/2015  . Benign essential HTN 03/18/2015  . Pain in shoulder 03/18/2015  . Dyslipidemia 03/18/2015  . History of surgery to major organs, presenting hazards to health 03/18/2015  . Hearing loss 03/18/2015  . Hiatal hernia 03/18/2015  . History of colon polyps 03/18/2015  . Calculus of kidney 03/18/2015  . B12 deficiency 03/18/2015  . Microalbuminuria 03/18/2015  . Adult BMI 30+ 03/18/2015  . Arthritis, degenerative 03/18/2015  . Diabetes mellitus with nephropathy ([place]) 03/18/2015    Past Surgical History:  Procedure Laterality Date  . ABDOMINAL HYSTERECTOMY    . CHOLECYSTECTOMY    . COLONOSCOPY  2015  . COLONOSCOPY WITH PROPOFOL N/A 03/04/2018    Procedure: COLONOSCOPY WITH PROPOFOL;  Surgeon: Toledo, Benay Pike, MD;  Location: [facility] ENDOSCOPY;  Service: Gastroenterology;  Laterality: N/A;  . cystoscopic    . LITHOTRIPSY  12/2007  . MYRINGOTOMY Right 11/2009    Family History  Problem Relation Age of Onset  . Cancer Mother   . Diabetes Mother   . Diabetes Daughter   . CAD Father   . Diabetes Daughter        hypoglycemia    Social History   Socioeconomic History  . Marital status: Widowed    Spouse name: Not on file  . Number of children: 2  . Years of education: Not on file  . Highest education level: Associate degree: occupational, Hotel manager, or vocational program  Occupational History    Employer: [employer]  Social Needs  . Financial resource strain: Somewhat hard  . Food insecurity:    Worry: Never true    Inability: Never true  . Transportation needs:    Medical: No    Non-medical: No  Tobacco Use  . Smoking status: Never Smoker  . Smokeless tobacco: Never Used  . Tobacco comment: smoking cessation materials not required  Substance and Sexual Activity  . Alcohol use: Yes    Alcohol/week: 1.0 standard drinks    Types: 1 Shots of liquor per week    Comment: socially  . Drug use: No  . Sexual activity:  Not Currently  Lifestyle  . Physical activity:    Days per week: 3 days    Minutes per session: 60 min  . Stress: Not at all  Relationships  . Social connections:    Talks on phone: Patient refused    Gets together: Patient refused    Attends religious service: Patient refused    Active member of club or organization: Patient refused    Attends meetings of clubs or organizations: Patient refused    Relationship status: Widowed  . Intimate partner violence:    Fear of current or ex partner: No    Emotionally abused: No    Physically abused: No    Forced sexual activity: No  Other Topics Concern  . Not on file  Social History Narrative  . Not on file     Current Outpatient  Medications:  .  ACCU-CHEK AVIVA PLUS test strip, TEST BLOOD SUGAR UP TO FOUR TIMES DAILY AS DIRECTED, Disp: 100 each, Rfl: 0 .  ACCU-CHEK SOFTCLIX LANCETS lancets, 1 each by Other route daily., Disp: , Rfl:  .  aspirin EC 81 MG tablet, Take 1 tablet (81 mg total) by mouth daily., Disp: 90 tablet, Rfl: 0 .  atorvastatin (LIPITOR) 40 MG tablet, Take 1 tablet (40 mg total) by mouth at bedtime., Disp: 12 tablet, Rfl: 0 .  blood glucose meter kit and supplies KIT, Dispense based on patient and insurance preference. Use up to four times daily as directed. (FOR ICD-9 250.00, 250.01)., Disp: 1 each, Rfl: 0 .  calcium citrate (CALCITRATE - DOSED IN MG ELEMENTAL CALCIUM) 950 MG tablet, Take 200 mg of elemental calcium by mouth daily., Disp: , Rfl:  .  fluconazole (DIFLUCAN) 150 MG tablet, Take 1 tablet (150 mg total) by mouth every other day., Disp: 3 tablet, Rfl: 0 .  fluticasone (FLONASE) 50 MCG/ACT nasal spray, Place 2 sprays into both nostrils daily., Disp: 16 g, Rfl: 6 .  Insulin Degludec (TRESIBA FLEXTOUCH) 200 UNIT/ML SOPN, Inject 50 Units into the skin daily., Disp: 27 mL, Rfl: 0 .  Insulin Pen Needle (NOVOFINE) 30G X 8 MM MISC, Inject 10 each into the skin as needed., Disp: 100 each, Rfl: 2 .  Lancet Devices MISC, 1 Units by Does not apply route 2 (two) times daily., Disp: 100 each, Rfl: 3 .  lisinopril (PRINIVIL,ZESTRIL) 20 MG tablet, Take 1 tablet (20 mg total) by mouth daily., Disp: 90 tablet, Rfl: 1 .  metFORMIN (GLUCOPHAGE-XR) 750 MG 24 hr tablet, Take 1 tablet (750 mg total) by mouth daily with breakfast., Disp: 180 tablet, Rfl: 1 .  methocarbamol (ROBAXIN) 500 MG tablet, Take by mouth., Disp: , Rfl:  .  neomycin-polymyxin b-dexamethasone (MAXITROL) 3.5-10000-0.1 OINT, APPLY A SMALL AMOUNT TO RIGHT UPPER LID MARGIN TWICE A DAY FOLLOWING HOT COMPRESSES, Disp: , Rfl: 99 .  oxyCODONE (ROXICODONE) 5 MG immediate release tablet, Take 1 tablet (5 mg total) by mouth every 8 (eight) hours as needed for  severe pain., Disp: 12 tablet, Rfl: 0 .  polyethylene glycol powder (GLYCOLAX/MIRALAX) powder, Take 17 g by mouth 2 (two) times daily as needed., Disp: 3350 g, Rfl: 1 .  predniSONE (STERAPRED UNI-PAK 48 TAB) 10 MG (48) TBPK tablet, Take as directed, Disp: 48 tablet, Rfl: 0  Allergies  Allergen Reactions  . Latex Rash  . Erythromycin Other (See Comments)    Strawberry tongue    I personally reviewed active problem list, medication list, allergies, family history, social history with the patient/caregiver today.  ROS  Constitutional: Negative for fever or weight change.  Respiratory: Negative for cough and shortness of breath.  She has mild wheezing  Cardiovascular: Negative for chest pain or palpitations.  Gastrointestinal: Negative for abdominal pain, no bowel changes.  Musculoskeletal: Negative for gait problem or joint swelling.  Skin: Negative for rash.  Neurological: Negative for dizziness or headache.  No other specific complaints in a complete review of systems (except as listed in HPI above).  Objective  Vitals:   06/02/18 1354  BP: 118/70  Pulse: (!) 101  Resp: 18  Temp: 98.5 F (36.9 C)  TempSrc: Oral  SpO2: 98%  Weight: 194 lb 12.8 oz (88.4 kg)  Height: 5' 3" (1.6 m)    Body mass index is 34.51 kg/m.  Physical Exam  Constitutional: Patient appears well-developed and well-nourished. Obese No distress.  HEENT: head atraumatic, normocephalic, pupils equal and reactive to light, neck supple, throat within normal limits Cardiovascular: Normal rate, regular rhythm and normal heart sounds.  No murmur heard. No BLE edema. Pulmonary/Chest: Effort normal and breath sounds normal. No respiratory distress. Abdominal: Soft.  There is no tenderness. Psychiatric: Patient has a normal mood and affect. behavior is normal. Judgment and thought content normal. Muscular Skeletal: normal flexion , extension and lateral bending, normal gait. No pain during palpation today    Recent Results (from the past 2160 hour(s))  POCT HgB A1C     Status: Abnormal   Collection Time: 03/30/18  4:05 PM  Result Value Ref Range   Hemoglobin A1C     HbA1c POC (<> result, manual entry)     HbA1c, POC (prediabetic range)     HbA1c, POC (controlled diabetic range) 8.1 (A) 0.0 - 7.0 %  HM DIABETES EYE EXAM     Status: None   Collection Time: 04/01/18 12:00 AM  Result Value Ref Range   HM Diabetic Eye Exam No Retinopathy No Retinopathy    Comment: [facility], Dr. Sandra Cockayne  Urinalysis, Complete w Microscopic     Status: Abnormal   Collection Time: 05/22/18  8:17 AM  Result Value Ref Range   Color, Urine YELLOW (A) YELLOW   APPearance CLEAR (A) CLEAR   Specific Gravity, Urine 1.013 1.005 - 1.030   pH 5.0 5.0 - 8.0   Glucose, UA NEGATIVE NEGATIVE mg/dL   Hgb urine dipstick NEGATIVE NEGATIVE   Bilirubin Urine NEGATIVE NEGATIVE   Ketones, ur NEGATIVE NEGATIVE mg/dL   Protein, ur NEGATIVE NEGATIVE mg/dL   Nitrite NEGATIVE NEGATIVE   Leukocytes, UA TRACE (A) NEGATIVE   RBC / HPF 0-5 0 - 5 RBC/hpf   WBC, UA 0-5 0 - 5 WBC/hpf   Bacteria, UA NONE SEEN NONE SEEN   Squamous Epithelial / LPF 0-5 0 - 5   Mucus PRESENT     Comment: Performed at [facility], [address], [place], [place] [address]  Urine culture     Status: Abnormal   Collection Time: 05/22/18  8:17 AM  Result Value Ref Range   Specimen Description      URINE, RANDOM Performed at [facility], [address] [place], [place] [address]    Special Requests      NONE Performed at [facility], [address] [place], [place] [address]    Culture (A)     <10,000 COLONIES/mL INSIGNIFICANT GROWTH Performed at [facility], [address], [place], [place] [address]    Report Status 05/23/2018 FINAL   COMPLETE METABOLIC PANEL  WITH GFR     Status: None   Collection Time: 05/25/18 10:01 AM  Result Value Ref Range   Glucose, Bld 104 65 - 139 mg/dL    Comment: .         Non-fasting reference interval .    BUN 13 7 - 25 mg/dL   Creat 0.79 0.60 - 0.93 mg/dL    Comment: For patients >94 years of age, the reference limit for Creatinine is approximately 13% higher for people identified as African-American. .    GFR, Est Non African American 74 > OR = 60 mL/min/1.21m   GFR, Est African American 86 > OR = 60 mL/min/1.758m  BUN/Creatinine Ratio NOT APPLICABLE 6 - 22 (calc)   Sodium 140 135 - 146 mmol/L   Potassium 4.2 3.5 - 5.3 mmol/L   Chloride 103 98 - 110 mmol/L   CO2 27 20 - 32 mmol/L   Calcium 10.0 8.6 - 10.4 mg/dL   Total Protein 6.6 6.1 - 8.1 g/dL   Albumin 3.9 3.6 - 5.1 g/dL   Globulin 2.7 1.9 - 3.7 g/dL (calc)   AG Ratio 1.4 1.0 - 2.5 (calc)   Total Bilirubin 0.5 0.2 - 1.2 mg/dL   Alkaline phosphatase (APISO) 64 33 - 130 U/L   AST 13 10 - 35 U/L   ALT 14 6 - 29 U/L  CBC with Differential/Platelet     Status: None   Collection Time: 05/25/18 10:01 AM  Result Value Ref Range   WBC 8.7 3.8 - 10.8 Thousand/uL   RBC 4.20 3.80 - 5.10 Million/uL   Hemoglobin 11.7 11.7 - 15.5 g/dL   HCT 35.0 35.0 - 45.0 %   MCV 83.3 80.0 - 100.0 fL   MCH 27.9 27.0 - 33.0 pg   MCHC 33.4 32.0 - 36.0 g/dL   RDW 13.2 11.0 - 15.0 %   Platelets 293 140 - 400 Thousand/uL   MPV 10.2 7.5 - 12.5 fL   Neutro Abs 5,977 1,500 - 7,800 cells/uL   Lymphs Abs 2,010 850 - 3,900 cells/uL   WBC mixed population 522 200 - 950 cells/uL   Eosinophils Absolute 148 15 - 500 cells/uL   Basophils Absolute 44 0 - 200 cells/uL   Neutrophils Relative % 68.7 %   Total Lymphocyte 23.1 %   Monocytes Relative 6.0 %   Eosinophils Relative 1.7 %   Basophils Relative 0.5 %     PHQ2/9: Depression [facility] 03/30/2018 07/18/2017 07/18/2017 07/14/2017 01/10/2017  Decreased Interest 0 0 0 0 0  Down, Depressed, Hopeless 0 0 0 0 0  PHQ - 2 Score 0 0 0 0 0  Altered sleeping - 0 - - -  Tired, decreased energy - 0 - - -  Change in appetite - 0 - - -  Feeling bad or failure about yourself   - 0 - - -  Trouble concentrating - 0 - - -  Moving slowly or fidgety/restless - 0 - - -  Suicidal thoughts - 0 - - -  PHQ-9 Score - 0 - - -  Difficult doing work/chores - Not difficult at all - - -     Fall Risk: Fall Risk  06/02/2018 03/30/2018 12/25/2017 11/17/2017 07/18/2017  Falls in the past year? 0 No No No No  Number falls in past yr: 0 - - - -  Injury with Fall? - - - - -  Risk for fall due to : - - - - -  Follow up - - - - -  Functional Status Survey: Is the patient deaf or have difficulty hearing?: No Does the patient have difficulty seeing, even when wearing glasses/contacts?: No Does the patient have difficulty concentrating, remembering, or making decisions?: No Does the patient have difficulty walking or climbing stairs?: No Does the patient have difficulty dressing or bathing?: No Does the patient have difficulty doing errands alone such as visiting a doctor's office or shopping?: No    Assessment & Plan  1. Community acquired pneumonia of left lower lobe of lung ([place])  She finished antibiotics, no SOB  2. Type 2 diabetes with nephropathy ([facility])  On steroids but glucose has been at goal  3. Degenerative disc disease, lumbar  Had acute on chronic flare , improving on steroids and also muscle relaxer given by Marin Olp PA and neurosurgery department at [facility]. She is doing better, but still taking medication that cause sedation and not sleeping well at night because of prednisone, we will release her back to work on Dec 2 nd 2019 Advised to take muscle relaxer prn now and when she goes back to work to take it at night only

## 2018-06-05 ENCOUNTER — Ambulatory Visit: Payer: Medicare HMO | Admitting: Family Medicine

## 2018-07-03 ENCOUNTER — Other Ambulatory Visit: Payer: Self-pay | Admitting: Pharmacist

## 2018-07-03 NOTE — Patient Outreach (Signed)
[place]) Care Management  07/03/2018  VEIDA SPIRA [birth year] [phone]  Patient was called regarding medication adherence with atorvastatin. Unfortunately, she did not answer the phone. HIPAA compliant message was left on her voicemail.  Plan: Document phone call on [employer] Adherence spreadsheet.   Elayne Guerin, PharmD, [place] Clinical Pharmacist [phone]

## 2018-07-21 ENCOUNTER — Encounter: Payer: Self-pay | Admitting: Family Medicine

## 2018-07-21 ENCOUNTER — Ambulatory Visit (INDEPENDENT_AMBULATORY_CARE_PROVIDER_SITE_OTHER): Payer: Medicare HMO

## 2018-07-21 ENCOUNTER — Ambulatory Visit (INDEPENDENT_AMBULATORY_CARE_PROVIDER_SITE_OTHER): Payer: Medicare HMO | Admitting: Family Medicine

## 2018-07-21 VITALS — BP 110/70 | HR 92 | Temp 97.8°F | Resp 16 | Ht 63.0 in | Wt 199.0 lb

## 2018-07-21 DIAGNOSIS — I700 Atherosclerosis of aorta: Secondary | ICD-10-CM | POA: Diagnosis not present

## 2018-07-21 DIAGNOSIS — E1165 Type 2 diabetes mellitus with hyperglycemia: Secondary | ICD-10-CM

## 2018-07-21 DIAGNOSIS — Z1231 Encounter for screening mammogram for malignant neoplasm of breast: Secondary | ICD-10-CM

## 2018-07-21 DIAGNOSIS — N200 Calculus of kidney: Secondary | ICD-10-CM | POA: Diagnosis not present

## 2018-07-21 DIAGNOSIS — Z780 Asymptomatic menopausal state: Secondary | ICD-10-CM | POA: Diagnosis not present

## 2018-07-21 DIAGNOSIS — IMO0002 Reserved for concepts with insufficient information to code with codable children: Secondary | ICD-10-CM

## 2018-07-21 DIAGNOSIS — I10 Essential (primary) hypertension: Secondary | ICD-10-CM

## 2018-07-21 DIAGNOSIS — E1121 Type 2 diabetes mellitus with diabetic nephropathy: Secondary | ICD-10-CM

## 2018-07-21 DIAGNOSIS — Z0000 Encounter for general adult medical examination without abnormal findings: Secondary | ICD-10-CM | POA: Diagnosis not present

## 2018-07-21 DIAGNOSIS — E785 Hyperlipidemia, unspecified: Secondary | ICD-10-CM

## 2018-07-21 DIAGNOSIS — D692 Other nonthrombocytopenic purpura: Secondary | ICD-10-CM

## 2018-07-21 LAB — POCT GLYCOSYLATED HEMOGLOBIN (HGB A1C): HbA1c, POC (controlled diabetic range): 9.1 % — AB (ref 0.0–7.0)

## 2018-07-21 MED ORDER — GLUCOSE BLOOD VI STRP: ORAL_STRIP | 0 refills | Status: DC

## 2018-07-21 MED ORDER — ACCU-CHEK SOFTCLIX LANCETS MISC: 1.0000 | Freq: Every day | 1 refills | Status: AC

## 2018-07-21 MED ORDER — ACCU-CHEK AVIVA DEVI: 0 refills | Status: DC

## 2018-07-21 MED ORDER — LISINOPRIL 20 MG PO TABS: 20.0000 mg | ORAL_TABLET | Freq: Every day | ORAL | 1 refills | Status: DC

## 2018-07-21 MED ORDER — ATORVASTATIN CALCIUM 40 MG PO TABS: 40.0000 mg | ORAL_TABLET | Freq: Every day | ORAL | 0 refills | Status: DC

## 2018-07-21 MED ORDER — METFORMIN HCL ER 750 MG PO TB24: 750.0000 mg | ORAL_TABLET | Freq: Every day | ORAL | 1 refills | Status: DC

## 2018-07-21 NOTE — Patient Instructions (Signed)
Christine Roberts , Thank you for taking time to come for your Medicare Wellness Visit. I appreciate your ongoing commitment to your health goals. Please review the following plan we discussed and let me know if I can assist you in the future.   Screening recommendations/referrals: Colonoscopy: done 03/04/18 repeat in 2024 Mammogram: done 01/14/17. Please call [phone] to schedule your mammogram and bone density exam. Bone Density: done January 2011, ordered today. Recommended yearly ophthalmology/optometry visit for glaucoma screening and checkup Recommended yearly dental visit for hygiene and checkup  Vaccinations: Influenza vaccine: done 03/30/18 Pneumococcal vaccine: done 03/21/15 Tdap vaccine: done 03/14/18 Shingles vaccine: Shingrix discussed. Please contact your pharmacy for coverage information.     Advanced directives: Please bring a copy of your health care power of attorney and living will to the office at your convenience.  Conditions/risks identified: Recommend continuing healthy habits for weight loss.   Next appointment: Please follow up in one year for your Medicare Annual Wellness visit.     Preventive Care 74 Years and Older, Female Preventive care refers to lifestyle choices and visits with your health care provider that can promote health and wellness. What does preventive care include?  A yearly physical exam. This is also called an annual well check.  Dental exams once or twice a year.  Routine eye exams. Ask your health care provider how often you should have your eyes checked.  Personal lifestyle choices, including:  Daily care of your teeth and gums.  Regular physical activity.  Eating a healthy diet.  Avoiding tobacco and drug use.  Limiting alcohol use.  Practicing safe sex.  Taking low-dose aspirin every day.  Taking vitamin and mineral supplements as recommended by your health care provider. What happens during an annual well check? The services  and screenings done by your health care provider during your annual well check will depend on your age, overall health, lifestyle risk factors, and family history of disease. Counseling  Your health care provider may ask you questions about your:  Alcohol use.  Tobacco use.  Drug use.  Emotional well-being.  Home and relationship well-being.  Sexual activity.  Eating habits.  History of falls.  Memory and ability to understand (cognition).  Work and work Statistician.  Reproductive health. Screening  You may have the following tests or measurements:  Height, weight, and BMI.  Blood pressure.  Lipid and cholesterol levels. These may be checked every 5 years, or more frequently if you are over 74 years old.  Skin check.  Lung cancer screening. You may have this screening every year starting at age 34 if you have a 30-pack-year history of smoking and currently smoke or have quit within the past 15 years.  Fecal occult blood test (FOBT) of the stool. You may have this test every year starting at age 65.  Flexible sigmoidoscopy or colonoscopy. You may have a sigmoidoscopy every 5 years or a colonoscopy every 10 years starting at age 19.  Hepatitis C blood test.  Hepatitis B blood test.  Sexually transmitted disease (STD) testing.  Diabetes screening. This is done by checking your blood sugar (glucose) after you have not eaten for a while (fasting). You may have this done every 1-3 years.  Bone density scan. This is done to screen for osteoporosis. You may have this done starting at age 14.  Mammogram. This may be done every 1-2 years. Talk to your health care provider about how often you should have regular mammograms. Talk with your health  care provider about your test results, treatment options, and if necessary, the need for more tests. Vaccines  Your health care provider may recommend certain vaccines, such as:  Influenza vaccine. This is recommended every  year.  Tetanus, diphtheria, and acellular pertussis (Tdap, Td) vaccine. You may need a Td booster every 10 years.  Zoster vaccine. You may need this after age 44.  Pneumococcal 13-valent conjugate (PCV13) vaccine. One dose is recommended after age 4.  Pneumococcal polysaccharide (PPSV23) vaccine. One dose is recommended after age 52. Talk to your health care provider about which screenings and vaccines you need and how often you need them. This information is not intended to replace advice given to you by your health care provider. Make sure you discuss any questions you have with your health care provider. Document Released: 07/21/2015 Document Revised: 03/13/2016 Document Reviewed: 04/25/2015 Elsevier Interactive Patient Education  2017 [place] Prevention in the Home Falls can cause injuries. They can happen to people of all ages. There are many things you can do to make your home safe and to help prevent falls. What can I do on the outside of my home?  Regularly fix the edges of walkways and driveways and fix any cracks.  Remove anything that might make you trip as you walk through a door, such as a raised step or threshold.  Trim any bushes or trees on the path to your home.  Use bright outdoor lighting.  Clear any walking paths of anything that might make someone trip, such as rocks or tools.  Regularly check to see if handrails are loose or broken. Make sure that both sides of any steps have handrails.  Any raised decks and porches should have guardrails on the edges.  Have any leaves, snow, or ice cleared regularly.  Use sand or salt on walking paths during winter.  Clean up any spills in your garage right away. This includes oil or grease spills. What can I do in the bathroom?  Use night lights.  Install grab bars by the toilet and in the tub and shower. Do not use towel bars as grab bars.  Use non-skid mats or decals in the tub or shower.  If you  need to sit down in the shower, use a plastic, non-slip stool.  Keep the floor dry. Clean up any water that spills on the floor as soon as it happens.  Remove soap buildup in the tub or shower regularly.  Attach bath mats securely with double-sided non-slip rug tape.  Do not have throw rugs and other things on the floor that can make you trip. What can I do in the bedroom?  Use night lights.  Make sure that you have a light by your bed that is easy to reach.  Do not use any sheets or blankets that are too big for your bed. They should not hang down onto the floor.  Have a firm chair that has side arms. You can use this for support while you get dressed.  Do not have throw rugs and other things on the floor that can make you trip. What can I do in the kitchen?  Clean up any spills right away.  Avoid walking on wet floors.  Keep items that you use a lot in easy-to-reach places.  If you need to reach something above you, use a strong step stool that has a grab bar.  Keep electrical cords out of the way.  Do not use floor  polish or wax that makes floors slippery. If you must use wax, use non-skid floor wax.  Do not have throw rugs and other things on the floor that can make you trip. What can I do with my stairs?  Do not leave any items on the stairs.  Make sure that there are handrails on both sides of the stairs and use them. Fix handrails that are broken or loose. Make sure that handrails are as long as the stairways.  Check any carpeting to make sure that it is firmly attached to the stairs. Fix any carpet that is loose or worn.  Avoid having throw rugs at the top or bottom of the stairs. If you do have throw rugs, attach them to the floor with carpet tape.  Make sure that you have a light switch at the top of the stairs and the bottom of the stairs. If you do not have them, ask someone to add them for you. What else can I do to help prevent falls?  Wear shoes  that:  Do not have high heels.  Have rubber bottoms.  Are comfortable and fit you well.  Are closed at the toe. Do not wear sandals.  If you use a stepladder:  Make sure that it is fully opened. Do not climb a closed stepladder.  Make sure that both sides of the stepladder are locked into place.  Ask someone to hold it for you, if possible.  Clearly mark and make sure that you can see:  Any grab bars or handrails.  First and last steps.  Where the edge of each step is.  Use tools that help you move around (mobility aids) if they are needed. These include:  Canes.  Walkers.  Scooters.  Crutches.  Turn on the lights when you go into a dark area. Replace any light bulbs as soon as they burn out.  Set up your furniture so you have a clear path. Avoid moving your furniture around.  If any of your floors are uneven, fix them.  If there are any pets around you, be aware of where they are.  Review your medicines with your doctor. Some medicines can make you feel dizzy. This can increase your chance of falling. Ask your doctor what other things that you can do to help prevent falls. This information is not intended to replace advice given to you by your health care provider. Make sure you discuss any questions you have with your health care provider. Document Released: 04/20/2009 Document Revised: 11/30/2015 Document Reviewed: 07/29/2014 Elsevier Interactive Patient Education  2017 [employer].

## 2018-07-21 NOTE — Progress Notes (Signed)
Subjective:   Christine Roberts is a 73 y.o. female who presents for Medicare Annual (Subsequent) preventive examination.  Review of Systems:   Cardiac Risk Factors include: advanced age (>55mn, >>34women);diabetes mellitus;dyslipidemia;hypertension;obesity (BMI >30kg/m2)     Objective:     Vitals: BP 110/70 (BP Location: Left Arm, Patient Position: Sitting, Cuff Size: Large)   Pulse 92   Temp 97.8 F (36.6 C) (Oral)   Resp 16   Ht 5' 3"  (1.6 m)   Wt 199 lb (90.3 kg)   SpO2 96%   BMI 35.25 kg/m   Body mass index is 35.25 kg/m.  Advanced Directives 07/21/2018 05/22/2018 03/14/2018 03/04/2018 07/18/2017 01/10/2017 09/09/2016  Does Patient Have a Medical Advance Directive? Yes No No Yes Yes Yes Yes  Type of [place] - - - [place]  Does patient want to make changes to medical advance directive? - - - - - - -  Copy of [place]? No - copy requested - - - No - copy requested No - copy requested -  Would patient like information on creating a medical advance directive? - No - Patient declined No - Patient declined - - - -    Tobacco Social History   Tobacco Use  Smoking Status Never Smoker  Smokeless Tobacco Never Used  Tobacco Comment   smoking cessation materials not required     Counseling given: Not Answered Comment: smoking cessation materials not required   Clinical Intake:  Pre-visit preparation completed: Yes  Pain : No/denies pain     Nutritional Status: BMI > 30  Obese Nutritional Risks: None Diabetes: Yes CBG done?: No Did pt. bring in CBG monitor from home?: No   Nutrition Risk Assessment:  Has the patient had any N/V/D within the last 2 months?  No  Does the patient have any non-healing wounds?  No  Has the patient had any unintentional weight loss or weight gain?  No    Diabetes:  Is the patient diabetic?  Yes  If diabetic, was a CBG obtained today?  No  Did the patient bring in their glucometer from home?  No  How often do you monitor your CBG's? Daily fasting usually, current meter not working. Advised pt to contact manufacturer for trouble shooting or replacement meter since it is only 654months old. .   Financial Strains and Diabetes Management:  Are you having any financial strains with the device, your supplies or your medication? Yes .  Pt stated copay for tresiba is approximately $125. She applied for financial assistance from the manufacturer but was denied due to still working full time.  Does the patient want to be seen by Chronic Care Management for management of their diabetes?  No  Would the patient like to be referred to a Nutritionist or for Diabetic Management?  No   Diabetic Exams:  Diabetic Eye Exam: Completed 04/01/18 negative retinopathy.   Diabetic Foot Exam: Completed 07/14/17. Pt being seen today for DM follow up and will have foot exam completed.   How often do you need to have someone help you when you read instructions, pamphlets, or other written materials from your doctor or pharmacy?: 1 - Never What is the last grade level you completed in school?: associates degree  Interpreter Needed?: No  Information entered by :: KClemetine MarkerLPN  Past Medical History:  Diagnosis Date  . Diabetes mellitus without complication ([place])   . Hyperlipidemia   . Hypertension   . Kidney stone   . Left shoulder pain   . Obesity   . Osteoarthritis   . Ovarian failure   . Stroke ([place])   . Vitamin B12 deficiency (non anemic)    Past Surgical History:  Procedure Laterality Date  . ABDOMINAL HYSTERECTOMY    . CHOLECYSTECTOMY    . COLONOSCOPY  2015  . COLONOSCOPY WITH PROPOFOL N/A 03/04/2018   Procedure: COLONOSCOPY WITH PROPOFOL;  Surgeon: Toledo, Benay Pike, MD;  Location: [facility] ENDOSCOPY;  Service: Gastroenterology;  Laterality: N/A;  .  cystoscopic     stent placed in ureter  . LITHOTRIPSY  12/2007  . MYRINGOTOMY Right 11/2009   Family History  Problem Relation Age of Onset  . Cancer Mother   . Diabetes Mother   . Diabetes Daughter   . CAD Father   . Diabetes Daughter        hypoglycemia   Social History   Socioeconomic History  . Marital status: Widowed    Spouse name: Not on file  . Number of children: 2  . Years of education: Not on file  . Highest education level: Associate degree: occupational, Hotel manager, or vocational program  Occupational History    Employer: [employer]  Social Needs  . Financial resource strain: Not hard at all  . Food insecurity:    Worry: Never true    Inability: Never true  . Transportation needs:    Medical: No    Non-medical: No  Tobacco Use  . Smoking status: Never Smoker  . Smokeless tobacco: Never Used  . Tobacco comment: smoking cessation materials not required  Substance and Sexual Activity  . Alcohol use: Yes    Alcohol/week: 1.0 standard drinks    Types: 1 Shots of liquor per week    Comment: socially  . Drug use: No  . Sexual activity: Not Currently  Lifestyle  . Physical activity:    Days per week: 3 days    Minutes per session: 60 min  . Stress: Not at all  Relationships  . Social connections:    Talks on phone: More than three times a week    Gets together: Three times a week    Attends religious service: More than 4 times per year    Active member of club or organization: No    Attends meetings of clubs or organizations: Never    Relationship status: Widowed  Other Topics Concern  . Not on file  Social History Narrative  . Not on file    Outpatient Encounter Medications as of 07/21/2018  Medication Sig  . ACCU-CHEK AVIVA PLUS test strip TEST BLOOD SUGAR UP TO FOUR TIMES DAILY AS DIRECTED  . ACCU-CHEK SOFTCLIX LANCETS lancets 1 each by Other route daily.  Marland Kitchen aspirin EC 81 MG tablet Take 1 tablet (81 mg total) by mouth daily.  Marland Kitchen  atorvastatin (LIPITOR) 40 MG tablet Take 1 tablet (40 mg total) by mouth at bedtime.  . blood glucose meter kit and supplies KIT Dispense based on patient and insurance preference. Use up to four times daily as directed. (FOR ICD-9 250.00, 250.01).  . calcium citrate (CALCITRATE - DOSED IN MG ELEMENTAL CALCIUM) 950 MG tablet Take 200 mg of elemental calcium by mouth daily.  . fluticasone (FLONASE) 50 MCG/ACT nasal spray Place 2 sprays into both nostrils daily.  . Insulin Degludec (TRESIBA FLEXTOUCH) 200 UNIT/ML SOPN  Inject 50 Units into the skin daily. (Patient taking differently: Inject 50 Units into the skin daily. Pt takes 22 units daily)  . Insulin Pen Needle (NOVOFINE) 30G X 8 MM MISC Inject 10 each into the skin as needed.  Elmore Guise Devices MISC 1 Units by Does not apply route 2 (two) times daily.  Marland Kitchen lisinopril (PRINIVIL,ZESTRIL) 20 MG tablet Take 1 tablet (20 mg total) by mouth daily.  . metFORMIN (GLUCOPHAGE-XR) 750 MG 24 hr tablet Take 1 tablet (750 mg total) by mouth daily with breakfast.  . polyethylene glycol powder (GLYCOLAX/MIRALAX) powder Take 17 g by mouth 2 (two) times daily as needed.  . [DISCONTINUED] fluconazole (DIFLUCAN) 150 MG tablet Take 1 tablet (150 mg total) by mouth every other day.  . [DISCONTINUED] neomycin-polymyxin b-dexamethasone (MAXITROL) 3.5-10000-0.1 OINT APPLY A SMALL AMOUNT TO RIGHT UPPER LID MARGIN TWICE A DAY FOLLOWING HOT COMPRESSES  . [DISCONTINUED] oxyCODONE (ROXICODONE) 5 MG immediate release tablet Take 1 tablet (5 mg total) by mouth every 8 (eight) hours as needed for severe pain.  . [DISCONTINUED] predniSONE (STERAPRED UNI-PAK 48 TAB) 10 MG (48) TBPK tablet Take as directed   No facility-administered encounter medications on file as of 07/21/2018.     Activities of Daily Living In your present state of health, do you have any difficulty performing the following activities: 07/21/2018 06/02/2018  Hearing? N N  Comment declines hearing aids -  Vision?  N N  Comment wears reading glasses -  Difficulty concentrating or making decisions? N N  Walking or climbing stairs? N N  Dressing or bathing? N N  Doing errands, shopping? N N  Preparing Food and eating ? N -  Using the Toilet? N -  In the past six months, have you accidently leaked urine? N -  Do you have problems with loss of bowel control? N -  Managing your Medications? N -  Managing your Finances? N -  Housekeeping or managing your Housekeeping? N -  Some recent data might be hidden    Patient Care Team: Steele Sizer, MD as PCP - General (Family Medicine) Murrell Redden, MD as Consulting Physician (Urology) Estill Cotta, MD as Consulting Physician (Ophthalmology) Randa Spike, MD as Consulting Physician (Dermatology)    Assessment:   This is a routine wellness examination for [place].  Exercise Activities and Dietary recommendations Current Exercise Habits: Structured exercise class, Type of exercise: treadmill;walking, Time (Minutes): 60, Frequency (Times/Week): 3, Weekly Exercise (Minutes/Week): 180, Intensity: Moderate, Exercise limited by: None identified  Goals    . DIET - INCREASE WATER INTAKE     Recommend to drink at least 6-8 8oz glasses of water per day     . Weight (lb) < 200 lb (90.7 kg)     Pt would like to lose 10-20 lbs this year.        Fall Risk Fall Risk  07/21/2018 06/02/2018 03/30/2018 12/25/2017 11/17/2017  Falls in the past year? 0 0 No No No  Number falls in past yr: 0 0 - - -  Injury with Fall? - - - - -  Risk for fall due to : - - - - -  Follow up - - - - -   [place]:  Any stairs in or around the home WITH handrails? Yes  Home free of loose throw rugs in walkways, pet beds, electrical cords, etc? Yes  Adequate lighting in your home to reduce risk of falls? Yes  ASSISTIVE DEVICES UTILIZED TO PREVENT FALLS:  Life alert? No  Use of a cane, walker or w/c? No  Grab bars in the bathroom? Yes    Shower chair or bench in shower? No  Elevated toilet seat or a handicapped toilet? No   DME ORDERS:  DME order needed?  No   TIMED UP AND GO:  Was the test performed? Yes .  Length of time to ambulate 10 feet: 5 sec.   GAIT:  Appearance of gait: Gait stead-fast and without the use of an assistive device.  Education: Fall risk prevention has been discussed.  Intervention(s) required? No   Depression Screen PHQ 2/9 Scores 07/21/2018 03/30/2018 07/18/2017 07/18/2017  PHQ - 2 Score 0 0 0 0  PHQ- 9 Score - - 0 -     Cognitive Function     6CIT Screen 07/21/2018 07/18/2017  What Year? 0 points 0 points  What month? 0 points 0 points  What time? 0 points 0 points  Count back from 20 0 points 0 points  Months in reverse 0 points 0 points  Repeat phrase 0 points 0 points  Total Score 0 0    Immunization History  Administered Date(s) Administered  . Influenza Split 04/11/2009  . Influenza, High Dose Seasonal PF 03/21/2015, 05/10/2016, 03/30/2017, 03/30/2018  . Influenza, Seasonal, Injecte, Preservative Fre 04/07/2012  . Influenza,inj,Quad PF,6+ Mos 03/07/2014  . Influenza-Unspecified 03/07/2014, 03/30/2017  . Pneumococcal Conjugate-13 03/21/2015  . Pneumococcal Polysaccharide-23 10/25/2009  . Tdap 10/25/2009, 03/14/2018  . Zoster 09/24/2011    Qualifies for Shingles Vaccine? Yes  Zostavax completed 2013. Due for Shingrix. Education has been provided regarding the importance of this vaccine. Pt has been advised to call insurance company to determine out of pocket expense. Advised may also receive vaccine at local pharmacy or Health Dept. Verbalized acceptance and understanding.  Tdap: Up to date  Flu Vaccine: Up to date  Pneumococcal Vaccine: Up to date  Screening Tests Health Maintenance  Topic Date Due  . FOOT EXAM  07/14/2018  . HEMOGLOBIN A1C  09/28/2018  . MAMMOGRAM  01/15/2019  . OPHTHALMOLOGY EXAM  04/02/2019  . COLONOSCOPY  03/05/2023  . TETANUS/TDAP   03/14/2028  . INFLUENZA VACCINE  Completed  . DEXA SCAN  Completed  . Hepatitis C Screening  Completed  . PNA vac Low Risk Adult  Completed    Cancer Screenings:  Colorectal Screening: Completed 03/04/18. Repeat every 5 years;   Mammogram: Completed 01/14/17. Repeat every year; Ordered today. Pt provided with contact information and advised to call to schedule appt.   Bone Density: Completed 2011. Results reflect NORMAL per patient.  Repeat every 2 years. Ordered today. Pt provided with contact information and advised to call to schedule appt.   Lung Cancer Screening: (Low Dose CT Chest recommended if Age 85-80 years, 30 pack-year currently smoking OR have quit w/in 15years.) does not qualify.    Additional Screening:  Hepatitis C Screening: does qualify; Completed 03/30/13.  Vision Screening: Recommended annual ophthalmology exams for early detection of glaucoma and other disorders of the eye. Is the patient up to date with their annual eye exam?  Yes  Who is the provider or what is the name of the office in which the pt attends annual eye exams? Dr. Thomasene Ripple  Dental Screening: Recommended annual dental exams for proper oral hygiene  Community Resource Referral:  CRR required this visit?  No      Plan:     I have personally reviewed and  addressed the Medicare Annual Wellness questionnaire and have noted the following in the patient's chart:  A. Medical and social history B. Use of alcohol, tobacco or illicit drugs  C. Current medications and supplements D. Functional ability and status E.  Nutritional status F.  Physical activity G. Advance directives H. List of other physicians I.  Hospitalizations, surgeries, and ER visits in previous 12 months J.  [place] such as hearing and vision if needed, cognitive and depression L. Referrals and appointments   In addition, I have reviewed and discussed with patient certain preventive protocols, quality metrics,  and best practice recommendations. A written personalized care plan for preventive services as well as general preventive health recommendations were provided to patient.   Signed,  Clemetine Marker, LPN Nurse Health Advisor   Nurse Notes:Pt states her accu chek aviva plus meter stopped working and she has not been checking blood sugar lately. Advised her to contact manufacturer for troubleshooting or replacement meter since it is less than 6 months old and insurance may not pay for another one at this time. Pt also stated she feels like victoza worked better for her to control her A1c and curb her appetite. She is exercising 3 times weekly but knows that her diet was not the best over the Christmas holiday. Pt being seen today for DM follow up.

## 2018-07-21 NOTE — Progress Notes (Signed)
Name: Christine Roberts   MRN: [id number]    DOB: [birth year]   Date:74/14/2020       Progress Note  Subjective  Chief Complaint  Chief Complaint  Patient presents with  . Medication Refill  . Hypertension  . Diabetes    Has only been taking 22 units of Tresiba daily instead of 50.  Marland Kitchen Dyslipidemia    HPI  DMII: she is on Metformin, [name] but only at 20 units because she was afraid of not being able to afford medication. She is off   Victoza since the Summer because she reached the doughnut hole. . She is on ace for microalbuminuria and denies side effects of medications. She has been back to the gym 3 days a week for one hour,  hgbA1C has gone up again from 8.1% ( when off Victoza ) to 9.1 % now. She states eats out a lot, not making wise choices , eating carbohydrates and lots of dessert through the holidays. She has nocturia - stable about 2 times per night, denies polydipsia or polyphagia  HTN - taking medication, no side effects, no chest pain, dizziness or palpitation. BP is at goal   Obesity: weight is gradually going up again, she will try to resume a healthier diet   Hyperlipidemia: taking lipitor a few times a week to avoid side effects, otherwise she has leg pains  TIA versus Complicated migraine: she developed dizziness, headache and aphasia on 11/16 - she was transported by EMS to [facility], she had normal Echo, CT, MRI of brain and carotid dopplers. She was seen by neurologist and advised to continue on aspirin and lipitor in case it was a TIA, but likely a complicated migraine. No other episodes since. She states only had migraine many years ago when on ocp.  Unchanged   B12 deficiency: still not back on supplementation  Nephrolithiasis: used to see Dr. Jacqlyn Larsen but needs local provider, not having pain or hematuria at this time  Senile purpura: on both arms and stable  Atherosclerosis of aorta: continue statin therapy and aspirin 81 mg daily   Patient Active Problem List    Diagnosis Date Noted  . Atherosclerosis of aorta ([place]) 05/25/2018  . Seasonal allergic rhinitis 09/12/2015  . TIA (transient ischemic attack) 05/24/2015  . Senile purpura ([place]) 05/09/2015  . Benign essential HTN 03/18/2015  . Pain in shoulder 03/18/2015  . Dyslipidemia 03/18/2015  . History of surgery to major organs, presenting hazards to health 03/18/2015  . Hearing loss 03/18/2015  . Hiatal hernia 03/18/2015  . History of colon polyps 03/18/2015  . Calculus of kidney 03/18/2015  . B12 deficiency 03/18/2015  . Microalbuminuria 03/18/2015  . Adult BMI 30+ 03/18/2015  . Arthritis, degenerative 03/18/2015  . Diabetes mellitus with nephropathy ([place]) 03/18/2015    Past Surgical History:  Procedure Laterality Date  . ABDOMINAL HYSTERECTOMY    . CHOLECYSTECTOMY    . COLONOSCOPY  2015  . COLONOSCOPY WITH PROPOFOL N/A 03/04/2018   Procedure: COLONOSCOPY WITH PROPOFOL;  Surgeon: Toledo, Benay Pike, MD;  Location: [facility] ENDOSCOPY;  Service: Gastroenterology;  Laterality: N/A;  . cystoscopic     stent placed in ureter  . LITHOTRIPSY  12/2007  . MYRINGOTOMY Right 11/2009    Family History  Problem Relation Age of Onset  . Cancer Mother   . Diabetes Mother   . Diabetes Daughter   . CAD Father   . Diabetes Daughter        hypoglycemia    Social  History   Socioeconomic History  . Marital status: Widowed    Spouse name: Not on file  . Number of children: 2  . Years of education: Not on file  . Highest education level: Associate degree: occupational, Hotel manager, or vocational program  Occupational History    Employer: [employer]  Social Needs  . Financial resource strain: Not hard at all  . Food insecurity:    Worry: Never true    Inability: Never true  . Transportation needs:    Medical: No    Non-medical: No  Tobacco Use  . Smoking status: Never Smoker  . Smokeless tobacco: Never Used  . Tobacco comment: smoking cessation materials not required   Substance and Sexual Activity  . Alcohol use: Yes    Alcohol/week: 1.0 standard drinks    Types: 1 Shots of liquor per week    Comment: socially  . Drug use: No  . Sexual activity: Not Currently  Lifestyle  . Physical activity:    Days per week: 3 days    Minutes per session: 60 min  . Stress: Not at all  Relationships  . Social connections:    Talks on phone: More than three times a week    Gets together: Three times a week    Attends religious service: More than 4 times per year    Active member of club or organization: No    Attends meetings of clubs or organizations: Never    Relationship status: Widowed  . Intimate partner violence:    Fear of current or ex partner: No    Emotionally abused: No    Physically abused: No    Forced sexual activity: No  Other Topics Concern  . Not on file  Social History Narrative  . Not on file     Current Outpatient Medications:  .  ACCU-CHEK AVIVA PLUS test strip, TEST BLOOD SUGAR UP TO FOUR TIMES DAILY AS DIRECTED, Disp: 100 each, Rfl: 0 .  ACCU-CHEK SOFTCLIX LANCETS lancets, 1 each by Other route daily., Disp: , Rfl:  .  aspirin EC 81 MG tablet, Take 1 tablet (81 mg total) by mouth daily., Disp: 90 tablet, Rfl: 0 .  atorvastatin (LIPITOR) 40 MG tablet, Take 1 tablet (40 mg total) by mouth at bedtime., Disp: 12 tablet, Rfl: 0 .  blood glucose meter kit and supplies KIT, Dispense based on patient and insurance preference. Use up to four times daily as directed. (FOR ICD-9 250.00, 250.01)., Disp: 1 each, Rfl: 0 .  calcium citrate (CALCITRATE - DOSED IN MG ELEMENTAL CALCIUM) 950 MG tablet, Take 200 mg of elemental calcium by mouth daily., Disp: , Rfl:  .  fluticasone (FLONASE) 50 MCG/ACT nasal spray, Place 2 sprays into both nostrils daily., Disp: 16 g, Rfl: 6 .  Insulin Degludec (TRESIBA FLEXTOUCH) 200 UNIT/ML SOPN, Inject 50 Units into the skin daily. (Patient taking differently: Inject 50 Units into the skin daily. Pt takes 22 units  daily), Disp: 27 mL, Rfl: 0 .  Insulin Pen Needle (NOVOFINE) 30G X 8 MM MISC, Inject 10 each into the skin as needed., Disp: 100 each, Rfl: 2 .  Lancet Devices MISC, 1 Units by Does not apply route 2 (two) times daily., Disp: 100 each, Rfl: 3 .  lisinopril (PRINIVIL,ZESTRIL) 20 MG tablet, Take 1 tablet (20 mg total) by mouth daily., Disp: 90 tablet, Rfl: 1 .  metFORMIN (GLUCOPHAGE-XR) 750 MG 24 hr tablet, Take 1 tablet (750 mg total) by mouth daily with  breakfast., Disp: 180 tablet, Rfl: 1 .  polyethylene glycol powder (GLYCOLAX/MIRALAX) powder, Take 17 g by mouth 2 (two) times daily as needed., Disp: 3350 g, Rfl: 1  Allergies  Allergen Reactions  . Latex Rash  . Erythromycin Other (See Comments)    Strawberry tongue    I personally reviewed active problem list, medication list, allergies, family history, social history with the patient/caregiver today.   ROS  Constitutional: Negative for fever or weight change.  Respiratory: Negative for cough and shortness of breath.   Cardiovascular: Negative for chest pain or palpitations.  Gastrointestinal: Negative for abdominal pain, no bowel changes.  Musculoskeletal: Negative for gait problem or joint swelling.  Skin: Negative for rash.  Neurological: Negative for dizziness or headache.  No other specific complaints in a complete review of systems (except as listed in HPI above).  Objective  Vitals:   07/21/18 1140  BP: 110/70  Pulse: 92  Resp: 16  Temp: 97.8 F (36.6 C)  TempSrc: Oral  SpO2: 96%  Weight: 199 lb (90.3 kg)  Height: '5\' 3"'$  (1.6 m)    Body mass index is 35.25 kg/m.  Physical Exam  Constitutional: Patient appears well-developed and well-nourished. Obese  No distress.  HEENT: head atraumatic, normocephalic, pupils equal and reactive to light,neck supple, throat within normal limits Cardiovascular: Normal rate, regular rhythm and normal heart sounds.  No murmur heard. No BLE edema. Pulmonary/Chest: Effort normal  and breath sounds normal. No respiratory distress. Abdominal: Soft.  There is no tenderness. Psychiatric: Patient has a normal mood and affect. behavior is normal. Judgment and thought content normal.  Recent Results (from the past 2160 hour(s))  Urinalysis, Complete w Microscopic     Status: Abnormal   Collection Time: 05/22/18  8:17 AM  Result Value Ref Range   Color, Urine YELLOW (A) YELLOW   APPearance CLEAR (A) CLEAR   Specific Gravity, Urine 1.013 1.005 - 1.030   pH 5.0 5.0 - 8.0   Glucose, UA NEGATIVE NEGATIVE mg/dL   Hgb urine dipstick NEGATIVE NEGATIVE   Bilirubin Urine NEGATIVE NEGATIVE   Ketones, ur NEGATIVE NEGATIVE mg/dL   Protein, ur NEGATIVE NEGATIVE mg/dL   Nitrite NEGATIVE NEGATIVE   Leukocytes, UA TRACE (A) NEGATIVE   RBC / HPF 0-5 0 - 5 RBC/hpf   WBC, UA 0-5 0 - 5 WBC/hpf   Bacteria, UA NONE SEEN NONE SEEN   Squamous Epithelial / LPF 0-5 0 - 5   Mucus PRESENT     Comment: Performed at [facility], [address] [place], [place] [address]  Urine culture     Status: Abnormal   Collection Time: 05/22/18  8:17 AM  Result Value Ref Range   Specimen Description      URINE, RANDOM Performed at [facility], [address] [place], [place] [address]    Special Requests      NONE Performed at [facility], [address] [place], [place] [address]    Culture (A)     <10,000 COLONIES/mL INSIGNIFICANT GROWTH Performed at [facility] [address], [place], [place] [address]    Report Status 05/23/2018 FINAL   COMPLETE METABOLIC PANEL WITH GFR     Status: None   Collection Time: 05/25/18 10:01 AM  Result Value Ref Range   Glucose, Bld 104 65 - 139 mg/dL    Comment: .        Non-fasting reference interval .    BUN 13 7 - 25 mg/dL  Creat 0.79 0.60 - 0.93 mg/dL    Comment: For patients >26 years of age, the reference limit for Creatinine is approximately 13% higher for people identified as African-American. .     GFR, Est Non African American 74 > OR = 60 mL/min/1.75m   GFR, Est African American 86 > OR = 60 mL/min/1.726m  BUN/Creatinine Ratio NOT APPLICABLE 6 - 22 (calc)   Sodium 140 135 - 146 mmol/L   Potassium 4.2 3.5 - 5.3 mmol/L   Chloride 103 98 - 110 mmol/L   CO2 27 20 - 32 mmol/L   Calcium 10.0 8.6 - 10.4 mg/dL   Total Protein 6.6 6.1 - 8.1 g/dL   Albumin 3.9 3.6 - 5.1 g/dL   Globulin 2.7 1.9 - 3.7 g/dL (calc)   AG Ratio 1.4 1.0 - 2.5 (calc)   Total Bilirubin 0.5 0.2 - 1.2 mg/dL   Alkaline phosphatase (APISO) 64 33 - 130 U/L   AST 13 10 - 35 U/L   ALT 14 6 - 29 U/L  CBC with Differential/Platelet     Status: None   Collection Time: 05/25/18 10:01 AM  Result Value Ref Range   WBC 8.7 3.8 - 10.8 Thousand/uL   RBC 4.20 3.80 - 5.10 Million/uL   Hemoglobin 11.7 11.7 - 15.5 g/dL   HCT 35.0 35.0 - 45.0 %   MCV 83.3 80.0 - 100.0 fL   MCH 27.9 27.0 - 33.0 pg   MCHC 33.4 32.0 - 36.0 g/dL   RDW 13.2 11.0 - 15.0 %   Platelets 293 140 - 400 Thousand/uL   MPV 10.2 7.5 - 12.5 fL   Neutro Abs 5,977 1,500 - 7,800 cells/uL   Lymphs Abs 2,010 850 - 3,900 cells/uL   WBC mixed population 522 200 - 950 cells/uL   Eosinophils Absolute 148 15 - 500 cells/uL   Basophils Absolute 44 0 - 200 cells/uL   Neutrophils Relative % 68.7 %   Total Lymphocyte 23.1 %   Monocytes Relative 6.0 %   Eosinophils Relative 1.7 %   Basophils Relative 0.5 %  POCT HgB A1C     Status: Abnormal   Collection Time: 07/21/18 11:48 AM  Result Value Ref Range   Hemoglobin A1C     HbA1c POC (<> result, manual entry)     HbA1c, POC (prediabetic range)     HbA1c, POC (controlled diabetic range) 9.1 (A) 0.0 - 7.0 %     PHQ2/9: Depression screen [facility]/9 07/21/2018 03/30/2018 07/18/2017 07/18/2017 07/14/2017  Decreased Interest 0 0 0 0 0  Down, Depressed, Hopeless 0 0 0 0 0  PHQ - 2 Score 0 0 0 0 0  Altered sleeping - - 0 - -  Tired, decreased energy - - 0 - -  Change in appetite - - 0 - -  Feeling bad or failure about  yourself  - - 0 - -  Trouble concentrating - - 0 - -  Moving slowly or fidgety/restless - - 0 - -  Suicidal thoughts - - 0 - -  PHQ-9 Score - - 0 - -  Difficult doing work/chores - - Not difficult at all - -     Fall Risk: Fall Risk  07/21/2018 06/02/2018 03/30/2018 12/25/2017 11/17/2017  Falls in the past year? 0 0 No No No  Number falls in past yr: 0 0 - - -  Injury with Fall? - - - - -  Risk for fall due to : - - - - -  Follow up - - - - -    Assessment & Plan  1. Type 2 diabetes with nephropathy (HCC)  - POCT HgB A1C -9.1%, she has not been following a diabetic diet and also did not titrate [place] because she was on the dougnut hole, saving money  - metFORMIN (GLUCOPHAGE-XR) 750 MG 24 hr tablet; Take 1 tablet (750 mg total) by mouth daily with breakfast.  Dispense: 180 tablet; Refill: 1 - lisinopril (PRINIVIL,ZESTRIL) 20 MG tablet; Take 1 tablet (20 mg total) by mouth daily.  Dispense: 90 tablet; Refill: 1 - glucose blood (ACCU-CHEK AVIVA PLUS) test strip; TEST BLOOD SUGAR UP TO FOUR TIMES DAILY AS DIRECTED  Dispense: 100 each; Refill: 0 - ACCU-CHEK SOFTCLIX LANCETS lancets; 1 each by Other route daily.  Dispense: 100 each; Refill: 1 - Blood Glucose Monitoring Suppl (ACCU-CHEK AVIVA) device; Use as instructed  Dispense: 1 each; Refill: 0  2. Dyslipidemia  - atorvastatin (LIPITOR) 40 MG tablet; Take 1 tablet (40 mg total) by mouth at bedtime.  Dispense: 12 tablet; Refill: 0  3. Benign essential HTN  - lisinopril (PRINIVIL,ZESTRIL) 20 MG tablet; Take 1 tablet (20 mg total) by mouth daily.  Dispense: 90 tablet; Refill: 1  4. Morbid obesity ([place])  Discussed with the patient the risk posed by an increased BMI. Discussed importance of portion control, calorie counting and at least 150 minutes of physical activity weekly. Avoid sweet beverages and drink more water. Eat at least 6 servings of fruit and vegetables daily   5. Atherosclerosis of aorta (HCC)  Continue statin therapy,  needs to follow a diabetic diet and get hgbA1C at least below 8  6. Senile purpura (HCC)  Stable   8. Calcium nephrolithiasis  - Ambulatory referral to Urology

## 2018-08-17 ENCOUNTER — Other Ambulatory Visit: Payer: Self-pay | Admitting: Family Medicine

## 2018-08-17 DIAGNOSIS — E1121 Type 2 diabetes mellitus with diabetic nephropathy: Secondary | ICD-10-CM

## 2018-08-17 DIAGNOSIS — I10 Essential (primary) hypertension: Secondary | ICD-10-CM

## 2018-08-18 ENCOUNTER — Ambulatory Visit: Payer: Medicare HMO | Admitting: Urology

## 2018-08-18 ENCOUNTER — Encounter: Payer: Self-pay | Admitting: Urology

## 2018-11-20 ENCOUNTER — Encounter: Payer: Self-pay | Admitting: Family Medicine

## 2018-11-20 ENCOUNTER — Ambulatory Visit (INDEPENDENT_AMBULATORY_CARE_PROVIDER_SITE_OTHER): Payer: Medicare HMO | Admitting: Family Medicine

## 2018-11-20 ENCOUNTER — Other Ambulatory Visit: Payer: Self-pay | Admitting: Family Medicine

## 2018-11-20 ENCOUNTER — Other Ambulatory Visit: Payer: Self-pay

## 2018-11-20 DIAGNOSIS — Z79899 Other long term (current) drug therapy: Secondary | ICD-10-CM | POA: Diagnosis not present

## 2018-11-20 DIAGNOSIS — D692 Other nonthrombocytopenic purpura: Secondary | ICD-10-CM | POA: Diagnosis not present

## 2018-11-20 DIAGNOSIS — E1121 Type 2 diabetes mellitus with diabetic nephropathy: Secondary | ICD-10-CM | POA: Diagnosis not present

## 2018-11-20 DIAGNOSIS — I10 Essential (primary) hypertension: Secondary | ICD-10-CM

## 2018-11-20 DIAGNOSIS — E785 Hyperlipidemia, unspecified: Secondary | ICD-10-CM

## 2018-11-20 DIAGNOSIS — I700 Atherosclerosis of aorta: Secondary | ICD-10-CM

## 2018-11-20 MED ORDER — INSULIN DEGLUDEC 200 UNIT/ML ~~LOC~~ SOPN: 50.0000 [IU] | PEN_INJECTOR | Freq: Every day | SUBCUTANEOUS | 0 refills | Status: DC

## 2018-11-20 NOTE — Progress Notes (Signed)
Name: Christine Roberts   MRN: [id number]    DOB: [birth year]   Date:11/20/2018       Progress Note  Subjective  Chief Complaint  Chief Complaint  Patient presents with   Diabetes   Hypertension   Dyslipidemia    I connected with  Christine Roberts  on 11/20/18 at  3:20 PM EDT by a video enabled telemedicine application and verified that I am speaking with the correct person using two identifiers.  I discussed the limitations of evaluation and management by telemedicine and the availability of in person appointments. The patient expressed understanding and agreed to proceed. Staff also discussed with the patient that there may be a patient responsible charge related to this service. Patient Location: at home Provider Location: Christine Roberts   HPI  DMII: she is on Metformin, Christine Roberts now at 34 units . She is off Victoza since the Summer because she reached the doughnut hole. .She is on ace for microalbuminuria and denies side effects of medications. She has been back to the gym 3 days a week for one hour,  hgbA1C has gone upagain from 8.1% ( when off Victoza ) to 9.1 % on her last visit . Fasting has been below 130, she is taking Metformin 1500 mg and gone up from Christine Roberts 20 unit and is now on 34 units  she is not drinking as much wine , only time she has hypoglycemia is in the afternoon while feeding her animals and working in her yard   HTN - taking medication, no side effects, no chest pain, dizziness or palpitation. Unchanged   Obesity: weight is gradually going up again, discussed healthy diet again   Hyperlipidemia: taking lipitor a few times a week to avoid side effects, otherwise she has leg pains  TIA versus Complicated migraine: she developed dizziness, headache and aphasia on 11/16 - she was transported by EMS to Christine Roberts, she had normal Echo, CT, MRI of brain and carotid dopplers. She was seen by neurologist and advised to continue on aspirin and lipitor in case it  was a TIA, but likely a complicated migraine. No other episodes since. Unchanged   B12 deficiency: still not back on supplementation, we will recheck levels  Nephrolithiasis: used to see Christine Roberts but needs local provider, not having pain or hematuria at this time  Senile purpura: on both arms, she states arms very bad, skin very thin  Atherosclerosis of aorta: continue statin therapy and aspirin 81 mg daily , only taking atorvastatin three times a week   Patient Active Problem List   Diagnosis Date Noted   Morbid obesity ([place]) 07/21/2018   Atherosclerosis of aorta ([place]) 05/25/2018   Seasonal allergic rhinitis 09/12/2015   TIA (transient ischemic attack) 05/24/2015   Senile purpura ([place]) 05/09/2015   Benign essential HTN 03/18/2015   Pain in shoulder 03/18/2015   Dyslipidemia 03/18/2015   History of surgery to major organs, presenting hazards to health 03/18/2015   Hearing loss 03/18/2015   Hiatal hernia 03/18/2015   History of colon polyps 03/18/2015   Calculus of kidney 03/18/2015   B12 deficiency 03/18/2015   Microalbuminuria 03/18/2015   Adult BMI 30+ 03/18/2015   Arthritis, degenerative 03/18/2015   Diabetes mellitus with nephropathy ([place]) 03/18/2015    Past Surgical History:  Procedure Laterality Date   ABDOMINAL HYSTERECTOMY     CHOLECYSTECTOMY     COLONOSCOPY  2015   COLONOSCOPY WITH PROPOFOL N/A 03/04/2018   Procedure: COLONOSCOPY WITH PROPOFOL;  Surgeon: Toledo, Benay Pike, MD;  Location: [facility] ENDOSCOPY;  Service: Gastroenterology;  Laterality: N/A;   cystoscopic     stent placed in ureter   LITHOTRIPSY  12/2007   MYRINGOTOMY Right 11/2009    Family History  Problem Relation Age of Onset   Cancer Mother    Diabetes Mother    Diabetes Daughter    CAD Father    Diabetes Daughter        hypoglycemia    Social History   Socioeconomic History   Marital status: Widowed    Spouse name: Not on file   Number of children: 2    Years of education: Not on file   Highest education level: Associate degree: occupational, Hotel manager, or vocational program  Occupational History    Employer:  [employer]  Social Designer, fashion/clothing strain: Not hard at all   Food insecurity:    Worry: Never true    Inability: Never true   Transportation needs:    Medical: No    Non-medical: No  Tobacco Use   Smoking status: Never Smoker   Smokeless tobacco: Never Used   Tobacco comment: smoking cessation materials not required  Substance and Sexual Activity   Alcohol use: Yes    Alcohol/week: 1.0 standard drinks    Types: 1 Shots of liquor per week    Comment: socially   Drug use: No   Sexual activity: Not Currently  Lifestyle   Physical activity:    Days per week: 3 days    Minutes per session: 60 min   Stress: Not at all  Relationships   Social connections:    Talks on phone: More than three times a week    Gets together: Three times a week    Attends religious service: More than 4 times per year    Active member of club or organization: No    Attends meetings of clubs or organizations: Never    Relationship status: Widowed   Intimate partner violence:    Fear of current or ex partner: No    Emotionally abused: No    Physically abused: No    Forced sexual activity: No  Other Topics Concern   Not on file  Social History Narrative   Not on file     Current Outpatient Medications:    ACCU-CHEK SOFTCLIX LANCETS lancets, 1 each by Other route daily., Disp: 100 each, Rfl: 1   aspirin EC 81 MG tablet, Take 1 tablet (81 mg total) by mouth daily., Disp: 90 tablet, Rfl: 0   atorvastatin (LIPITOR) 40 MG tablet, Take 1 tablet (40 mg total) by mouth at bedtime., Disp: 12 tablet, Rfl: 0   blood glucose meter kit and supplies KIT, Dispense based on patient and insurance preference. Use up to four times daily as directed. (FOR ICD-9 250.00, 250.01)., Disp: 1 each, Rfl: 0   Blood  Glucose Monitoring Suppl (ACCU-CHEK AVIVA) device, Use as instructed, Disp: 1 each, Rfl: 0   calcium citrate (CALCITRATE - DOSED IN MG ELEMENTAL CALCIUM) 950 MG tablet, Take 200 mg of elemental calcium by mouth daily., Disp: , Rfl:    fluticasone (FLONASE) 50 MCG/ACT nasal spray, Place 2 sprays into both nostrils daily., Disp: 16 g, Rfl: 6   glucose blood (ACCU-CHEK AVIVA PLUS) test strip, TEST BLOOD SUGAR UP TO FOUR TIMES DAILY AS DIRECTED, Disp: 100 each, Rfl: 0   Insulin Degludec (TRESIBA FLEXTOUCH) 200 UNIT/ML SOPN, Inject 50 Units into the skin daily., Disp: 27  mL, Rfl: 0   Insulin Pen Needle (NOVOFINE) 30G X 8 MM MISC, Inject 10 each into the skin as needed., Disp: 100 each, Rfl: 2   Lancet Devices MISC, 1 Units by Does not apply route 2 (two) times daily., Disp: 100 each, Rfl: 3   lisinopril (PRINIVIL,ZESTRIL) 20 MG tablet, TAKE 1 TABLET (20 MG TOTAL) BY MOUTH DAILY., Disp: 90 tablet, Rfl: 1   metFORMIN (GLUCOPHAGE-XR) 750 MG 24 hr tablet, Take 1 tablet (750 mg total) by mouth daily with breakfast., Disp: 180 tablet, Rfl: 1   polyethylene glycol powder (GLYCOLAX/MIRALAX) powder, Take 17 g by mouth 2 (two) times daily as needed., Disp: 3350 g, Rfl: 1  Allergies  Allergen Reactions   Latex Rash   Erythromycin Other (See Comments)    Strawberry tongue    I personally reviewed active problem list, medication list, allergies, family history, social history with the patient/caregiver today.   ROS  Ten systems reviewed and is negative except as mentioned in HPI   Objective  Virtual encounter, vitals not obtained.  There is no height or weight on file to calculate BMI.  Physical Exam  Awake, alert and oriented in no distress   PHQ2/9: Depression screen [facility] 2/9 11/20/2018 07/21/2018 03/30/2018 07/18/2017 07/18/2017  Decreased Interest 0 0 0 0 0  Down, Depressed, Hopeless 0 0 0 0 0  PHQ - 2 Score 0 0 0 0 0  Altered sleeping 0 - - 0 -  Tired, decreased energy 0 - - 0 -    Change in appetite 0 - - 0 -  Feeling bad or failure about yourself  0 - - 0 -  Trouble concentrating 0 - - 0 -  Moving slowly or fidgety/restless 0 - - 0 -  Suicidal thoughts 0 - - 0 -  PHQ-9 Score 0 - - 0 -  Difficult doing work/chores - - - Not difficult at all -   PHQ-2/9 Result is negative.    Fall Risk: Fall Risk  11/20/2018 07/21/2018 06/02/2018 03/30/2018 12/25/2017  Falls in the past year? 0 0 0 No No  Number falls in past yr: 0 0 0 - -  Injury with Fall? 0 - - - -  Risk for fall due to : - - - - -  Follow up - - - - -    Assessment & Plan  1. Benign essential HTN  - COMPLETE METABOLIC PANEL WITH GFR  2. Type 2 diabetes with nephropathy (HCC)  - Hemoglobin A1c - Microalbumin / creatinine urine ratio - Insulin Degludec (TRESIBA FLEXTOUCH) 200 UNIT/ML SOPN; Inject 50 Units into the skin daily.  Dispense: 27 mL; Refill: 0  3. Dyslipidemia  - Lipid panel  4. Senile purpura (HCC)  - CBC with Differential/Platelet  5. Atherosclerosis of aorta ([place])   6. Morbid obesity ([place])  Discussed with the patient the risk posed by an increased BMI. Discussed importance of portion control, calorie counting and at least 150 minutes of physical activity weekly. Avoid sweet beverages and drink more water. Eat at least 6 servings of fruit and vegetables daily   7. Long-term use of high-risk medication  - B12 and Folate Panel   I discussed the assessment and treatment plan with the patient. The patient was provided an opportunity to ask questions and all were answered. The patient agreed with the plan and demonstrated an understanding of the instructions.  The patient was advised to call back or seek an in-person evaluation if the symptoms worsen  or if the condition fails to improve as anticipated.  I provided 25 minutes of non-face-to-face time during this encounter.

## 2018-11-23 DIAGNOSIS — Z79899 Other long term (current) drug therapy: Secondary | ICD-10-CM | POA: Diagnosis not present

## 2018-11-23 DIAGNOSIS — D692 Other nonthrombocytopenic purpura: Secondary | ICD-10-CM | POA: Diagnosis not present

## 2018-11-23 DIAGNOSIS — E1121 Type 2 diabetes mellitus with diabetic nephropathy: Secondary | ICD-10-CM | POA: Diagnosis not present

## 2018-11-23 DIAGNOSIS — E785 Hyperlipidemia, unspecified: Secondary | ICD-10-CM | POA: Diagnosis not present

## 2018-11-23 DIAGNOSIS — I10 Essential (primary) hypertension: Secondary | ICD-10-CM | POA: Diagnosis not present

## 2018-11-24 LAB — COMPLETE METABOLIC PANEL WITH GFR
AG Ratio: 1.5 (calc) (ref 1.0–2.5)
ALT: 10 U/L (ref 6–29)
AST: 11 U/L (ref 10–35)
Albumin: 3.8 g/dL (ref 3.6–5.1)
Alkaline phosphatase (APISO): 67 U/L (ref 37–153)
BUN: 13 mg/dL (ref 7–25)
CO2: 28 mmol/L (ref 20–32)
Calcium: 9.1 mg/dL (ref 8.6–10.4)
Chloride: 106 mmol/L (ref 98–110)
Creat: 0.69 mg/dL (ref 0.60–0.93)
GFR, Est African American: 99 mL/min/{1.73_m2} (ref >=60)
GFR, Est Non African American: 86 mL/min/{1.73_m2} (ref >=60)
Globulin: 2.5 g/dL (calc) (ref 1.9–3.7)
Glucose, Bld: 119 mg/dL — ABNORMAL HIGH (ref 65–99)
Potassium: 4.2 mmol/L (ref 3.5–5.3)
Sodium: 140 mmol/L (ref 135–146)
Total Bilirubin: 0.4 mg/dL (ref 0.2–1.2)
Total Protein: 6.3 g/dL (ref 6.1–8.1)

## 2018-11-24 LAB — B12 AND FOLATE PANEL
Folate: 12.8 ng/mL
Vitamin B-12: 488 pg/mL (ref 200–1100)

## 2018-11-24 LAB — HEMOGLOBIN A1C
Hgb A1c MFr Bld: 7.7 % of total Hgb — ABNORMAL HIGH (ref <5.7)
Mean Plasma Glucose: 174 (calc)
eAG (mmol/L): 9.7 (calc)

## 2018-11-24 LAB — MICROALBUMIN / CREATININE URINE RATIO
Creatinine, Urine: 98 mg/dL (ref 20–275)
Microalb, Ur: <0.2 mg/dL

## 2018-11-24 LAB — CBC WITH DIFFERENTIAL/PLATELET
Absolute Monocytes: 518 cells/uL (ref 200–950)
Basophils Absolute: 22 cells/uL (ref 0–200)
Basophils Relative: 0.3 %
Eosinophils Absolute: 178 cells/uL (ref 15–500)
Eosinophils Relative: 2.4 %
HCT: 36.1 % (ref 35.0–45.0)
Hemoglobin: 12 g/dL (ref 11.7–15.5)
Lymphs Abs: 1954 cells/uL (ref 850–3900)
MCH: 27.6 pg (ref 27.0–33.0)
MCHC: 33.2 g/dL (ref 32.0–36.0)
MCV: 83 fL (ref 80.0–100.0)
MPV: 9.9 fL (ref 7.5–12.5)
Monocytes Relative: 7 %
Neutro Abs: 4729 cells/uL (ref 1500–7800)
Neutrophils Relative %: 63.9 %
Platelets: 272 10*3/uL (ref 140–400)
RBC: 4.35 10*6/uL (ref 3.80–5.10)
RDW: 13.4 % (ref 11.0–15.0)
Total Lymphocyte: 26.4 %
WBC: 7.4 10*3/uL (ref 3.8–10.8)

## 2018-11-24 LAB — LIPID PANEL
Cholesterol: 158 mg/dL (ref <200)
HDL: 62 mg/dL (ref >=50)
LDL Cholesterol (Calc): 82 mg/dL (calc)
Non-HDL Cholesterol (Calc): 96 mg/dL (calc) (ref <130)
Total CHOL/HDL Ratio: 2.5 (calc) (ref <5.0)
Triglycerides: 59 mg/dL (ref <150)

## 2018-12-24 ENCOUNTER — Other Ambulatory Visit: Payer: Self-pay | Admitting: Family Medicine

## 2018-12-24 DIAGNOSIS — E785 Hyperlipidemia, unspecified: Secondary | ICD-10-CM

## 2018-12-24 DIAGNOSIS — E1121 Type 2 diabetes mellitus with diabetic nephropathy: Secondary | ICD-10-CM

## 2018-12-24 DIAGNOSIS — I10 Essential (primary) hypertension: Secondary | ICD-10-CM

## 2018-12-24 MED ORDER — ATORVASTATIN CALCIUM 40 MG PO TABS: 40.0000 mg | ORAL_TABLET | ORAL | 0 refills | Status: DC

## 2018-12-31 ENCOUNTER — Emergency Department: Payer: Medicare HMO

## 2018-12-31 ENCOUNTER — Other Ambulatory Visit: Payer: Self-pay

## 2018-12-31 ENCOUNTER — Ambulatory Visit: Payer: Self-pay | Admitting: *Deleted

## 2018-12-31 ENCOUNTER — Encounter: Payer: Self-pay | Admitting: Intensive Care

## 2018-12-31 ENCOUNTER — Emergency Department
Admission: EM | Admit: 2018-12-31 | Discharge: 2018-12-31 | Disposition: A | Discharge status: Home / Self Care | Payer: Medicare HMO | Attending: Emergency Medicine | Admitting: Emergency Medicine

## 2018-12-31 DIAGNOSIS — Z7982 Long term (current) use of aspirin: Secondary | ICD-10-CM | POA: Diagnosis not present

## 2018-12-31 DIAGNOSIS — R2241 Localized swelling, mass and lump, right lower limb: Secondary | ICD-10-CM | POA: Diagnosis not present

## 2018-12-31 DIAGNOSIS — Z79899 Other long term (current) drug therapy: Secondary | ICD-10-CM | POA: Insufficient documentation

## 2018-12-31 DIAGNOSIS — I10 Essential (primary) hypertension: Secondary | ICD-10-CM | POA: Diagnosis not present

## 2018-12-31 DIAGNOSIS — Z8673 Personal history of transient ischemic attack (TIA), and cerebral infarction without residual deficits: Secondary | ICD-10-CM | POA: Diagnosis not present

## 2018-12-31 DIAGNOSIS — R600 Localized edema: Secondary | ICD-10-CM | POA: Diagnosis not present

## 2018-12-31 DIAGNOSIS — M7989 Other specified soft tissue disorders: Secondary | ICD-10-CM | POA: Diagnosis present

## 2018-12-31 DIAGNOSIS — Z794 Long term (current) use of insulin: Secondary | ICD-10-CM | POA: Diagnosis not present

## 2018-12-31 DIAGNOSIS — E119 Type 2 diabetes mellitus without complications: Secondary | ICD-10-CM | POA: Diagnosis not present

## 2018-12-31 NOTE — Telephone Encounter (Signed)
Patient calls with bilateral lower extremity/ankle/foot edema, right greater than left. swelling goes halfway up the right leg feels warm to touch and looks deep red in color. Toes feel numb. Denies calf pain in both legs/no bruising or pain but reports stinging in the right foot. Reports no fever /chills. No SOB/CP. Keeping feet on a stool at work but not elevated as needed. No elastic socks/stockings worn. Wearing crocs unable to wear normal shoes. No HA/vision changes/dizziness. On vacation last week and admits to added NA in her diet. Also had pedicure and part of a nail on the right foot was disturbed by the technician. Reviewed sxs to seek immediate emergency evaluation if develop including SOB/Fever/CP or worsening edema.Stated she understood. Warm transferred to PCP.  Reason for Disposition . [1] Red area or streak [2] large (> 2 in. or 5 cm)  Answer Assessment - Initial Assessment Questions 1. ONSET: "When did the swelling start?" (e.g., minutes, hours, days)     Started last week while at the beach. 2. LOCATION: "What part of the leg is swollen?"  "Are both legs swollen or just one leg?"     Both legs have swelling half way up the lower legs/ankles/feet. Right greater than left. 3. SEVERITY: "How bad is the swelling?" (e.g., localized; mild, moderate, severe)  - Localized - small area of swelling localized to one leg  - MILD pedal edema - swelling limited to foot and ankle, pitting edema < 1/4 inch (6 mm) deep, rest and elevation eliminate most or all swelling  - MODERATE edema - swelling of lower leg to knee, pitting edema > 1/4 inch (6 mm) deep, rest and elevation only partially reduce swelling  - SEVERE edema - swelling extends above knee, facial or hand swelling present      Moderate. 4. REDNESS: "Does the swelling look red or infected?"    Does not know. Right leg warm to touch. 5. PAIN: "Is the swelling painful to touch?" If so, ask: "How painful is it?"   (Scale 1-10; mild,  moderate or severe)     Right leg stings.Right foot numb just started. 6. FEVER: "Do you have a fever?" If so, ask: "What is it, how was it measured, and when did it start?"      no 7. CAUSE: "What do you think is causing the leg swelling?"     Was on vacation last week and admits to added salt to meals and drinks.  8. MEDICAL HISTORY: "Do you have a history of heart failure, kidney disease, liver failure, or cancer?"     Diabetes.htn 9. RECURRENT SYMPTOM: "Have you had leg swelling before?" If so, ask: "When was the last time?" "What happened that time?"     Yes but not one leg greater than the other.  10. OTHER SYMPTOMS: "Do you have any other symptoms?" (e.g., chest pain, difficulty breathing)       no 11. PREGNANCY: "Is there any chance you are pregnant?" "When was your last menstrual period?"     No  Protocols used: LEG SWELLING AND EDEMA-A-AH

## 2018-12-31 NOTE — ED Notes (Signed)
Patient on stretcher in nad.  Right lower extremity redness noted.  She has already been to ultrasound and been seen by ed md.

## 2018-12-31 NOTE — ED Provider Notes (Signed)
[facility] Emergency Department Provider Note   ____________________________________________    I have reviewed the triage vital signs and the nursing notes.   HISTORY  Chief Complaint Leg Swelling     HPI Christine Roberts is a 73 y.o. female with a history of diabetes, hyperlipidemia, hypertension who presents with complaints of right leg swelling over the last week.  She reports she returned from the beach 1 week ago typically has some swelling in her feet after traveling however has not seemed to improve.  She did notice some mild erythema to the anterior lower leg as well denies pain or burning.  No fevers or chills.  No pleurisy or shortness of breath.  Past Medical History:  Diagnosis Date   Diabetes mellitus without complication ([place])    Hyperlipidemia    Hypertension    Kidney stone    Left shoulder pain    Obesity    Osteoarthritis    Ovarian failure    Stroke ([place])    Vitamin B12 deficiency (non anemic)     Patient Active Problem List   Diagnosis Date Noted   Morbid obesity ([place]) 07/21/2018   Atherosclerosis of aorta ([place]) 05/25/2018   Seasonal allergic rhinitis 09/12/2015   TIA (transient ischemic attack) 05/24/2015   Senile purpura ([place]) 05/09/2015   Benign essential HTN 03/18/2015   Pain in shoulder 03/18/2015   Dyslipidemia 03/18/2015   History of surgery to major organs, presenting hazards to health 03/18/2015   Hearing loss 03/18/2015   Hiatal hernia 03/18/2015   History of colon polyps 03/18/2015   Calculus of kidney 03/18/2015   B12 deficiency 03/18/2015   Microalbuminuria 03/18/2015   Adult BMI 30+ 03/18/2015   Arthritis, degenerative 03/18/2015   Diabetes mellitus with nephropathy ([place]) 03/18/2015    Past Surgical History:  Procedure Laterality Date   ABDOMINAL HYSTERECTOMY     CHOLECYSTECTOMY     COLONOSCOPY  2015   COLONOSCOPY WITH PROPOFOL N/A 03/04/2018   Procedure:  COLONOSCOPY WITH PROPOFOL;  Surgeon: Toledo, Benay Pike, MD;  Location: [facility] ENDOSCOPY;  Service: Gastroenterology;  Laterality: N/A;   cystoscopic     stent placed in ureter   LITHOTRIPSY  12/2007   MYRINGOTOMY Right 11/2009    Prior to Admission medications   Medication Sig Start Date End Date Taking? Authorizing Provider  ACCU-CHEK SOFTCLIX LANCETS lancets 1 each by Other route daily. 07/21/18  Yes Steele Sizer, MD  aspirin EC 81 MG tablet Take 1 tablet (81 mg total) by mouth daily. 03/21/15  Yes Sowles, Drue Stager, MD  atorvastatin (LIPITOR) 40 MG tablet Take 1 tablet (40 mg total) by mouth once a week. 12/24/18  Yes Steele Sizer, MD  blood glucose meter kit and supplies KIT Dispense based on patient and insurance preference. Use up to four times daily as directed. (FOR ICD-9 250.00, 250.01). 08/22/17  Yes Sowles, Drue Stager, MD  Blood Glucose Monitoring Suppl (ACCU-CHEK AVIVA) device Use as instructed 07/21/18 07/21/19 Yes Sowles, Drue Stager, MD  calcium citrate (CALCITRATE - DOSED IN MG ELEMENTAL CALCIUM) 950 MG tablet Take 200 mg of elemental calcium by mouth daily.   Yes [provider]  fluticasone (FLONASE) 50 MCG/ACT nasal spray Place 2 sprays into both nostrils daily. 08/08/15  Yes Sowles, Drue Stager, MD  glucose blood (ACCU-CHEK AVIVA PLUS) test strip TEST BLOOD SUGAR UP TO FOUR TIMES DAILY AS DIRECTED 07/21/18  Yes Sowles, Drue Stager, MD  Insulin Degludec (TRESIBA FLEXTOUCH) 200 UNIT/ML SOPN Inject 50 Units into the skin daily.  11/20/18  Yes Sowles, Drue Stager, MD  Insulin Pen Needle (NOVOFINE) 30G X 8 MM MISC Inject 10 each into the skin as needed. 11/02/15  Yes Steele Sizer, MD  Lancet Devices MISC 1 Units by Does not apply route 2 (two) times daily. 10/27/15  Yes Sowles, Drue Stager, MD  lisinopril (ZESTRIL) 20 MG tablet TAKE 1 TABLET EVERY DAY 12/24/18  Yes Sowles, Drue Stager, MD  metFORMIN (GLUCOPHAGE-XR) 750 MG 24 hr tablet Take 1 tablet (750 mg total) by mouth daily with breakfast. 07/21/18   Yes Sowles, Drue Stager, MD  polyethylene glycol powder (GLYCOLAX/MIRALAX) powder Take 17 g by mouth 2 (two) times daily as needed. 11/17/17  Yes Steele Sizer, MD     Allergies Latex and Erythromycin  Family History  Problem Relation Age of Onset   Cancer Mother    Diabetes Mother    Diabetes Daughter    CAD Father    Diabetes Daughter        hypoglycemia    Social History Social History   Tobacco Use   Smoking status: Never Smoker   Smokeless tobacco: Never Used   Tobacco comment: smoking cessation materials not required  Substance Use Topics   Alcohol use: Yes    Alcohol/week: 1.0 standard drinks    Types: 1 Shots of liquor per week    Comment: socially   Drug use: No    Review of Systems  Constitutional: No fever/chills Eyes: No visual changes.  ENT: No sore throat. Cardiovascular: Denies chest pain. Respiratory: As above Gastrointestinal: No abdominal pain. Genitourinary: Negative for dysuria. Musculoskeletal: Leg swelling as above Skin: Negative for rash. Neurological: Negative for headaches or weakness   ____________________________________________   PHYSICAL EXAM:  VITAL SIGNS: ED Triage Vitals  Enc Vitals Group     BP 12/31/18 1254 (!) 156/94     Pulse Rate 12/31/18 1254 86     Resp 12/31/18 1254 16     Temp 12/31/18 1254 98.5 F (36.9 C)     Temp Source 12/31/18 1254 Oral     SpO2 12/31/18 1254 97 %     Weight 12/31/18 1255 90.7 kg (200 lb)     Height 12/31/18 1255 1.613 m (5' 3.5")     Head Circumference --      Peak Flow --      Pain Score 12/31/18 1254 4     Pain Loc --      Pain Edu? --      Excl. in [place]? --     Constitutional: Alert and oriented. No acute distress. Pleasant and interactive  Nose: No congestion/rhinnorhea. Mouth/Throat: Mucous membranes are moist.   Neck:  Painless ROM Cardiovascular: Normal rate, regular rhythm. Grossly normal heart sounds.  Good peripheral circulation. Respiratory: Normal respiratory  effort.  No retractions. Lungs CTAB. Gastrointestinal: Soft and nontender. No distention.  No CVA tenderness. Genitourinary: deferred Musculoskeletal: Right leg: 1+ edema to mid lower leg, minimal erythema anterior lower leg, no tenderness.  2+ pulses Neurologic:  Normal speech and language. No gross focal neurologic deficits are appreciated.  Skin:  Skin is warm, dry and intact. No rash noted. Psychiatric: Mood and affect are normal. Speech and behavior are normal.  ____________________________________________   LABS (all labs ordered are listed, but only abnormal results are displayed)  Labs Reviewed - No data to display ____________________________________________  EKG  None ____________________________________________  RADIOLOGY  Ultrasound negative for DVT ____________________________________________   PROCEDURES  Procedure(s) performed: No  Procedures   Critical Care performed: No ____________________________________________  INITIAL IMPRESSION / ASSESSMENT AND PLAN / ED COURSE  Pertinent labs & imaging results that were available during my care of the patient were reviewed by me and considered in my medical decision making (see chart for details).  Patient well-appearing and in no acute distress, minimal swelling to the right lower leg, no calf tenderness to palpation.  However given recent travel risk for DVT, will send for ultrasound  Ultrasound is unremarkable, patient reports she had labs performed 2 months ago which were unremarkable.  We will not repeat these now.  Recommend outpatient follow-up with PCP for further evaluation    ____________________________________________   FINAL CLINICAL IMPRESSION(S) / ED DIAGNOSES  Final diagnoses:  Leg edema        Note:  This document was prepared using Dragon voice recognition software and may include unintentional dictation errors.   Lavonia Drafts, MD 12/31/18 [id number]

## 2018-12-31 NOTE — ED Triage Notes (Signed)
Patient c/o right leg/foot swelling X1 week. Patient also has some warmth and redness noted to right shin. Pain with movement. Describes as tight and sore. Patient also reports going to [employer] last week and came in contact with her grandsons fiance who just tested positive. Patient denies any covid symptoms

## 2019-01-07 ENCOUNTER — Other Ambulatory Visit: Payer: Self-pay

## 2019-01-07 DIAGNOSIS — E1121 Type 2 diabetes mellitus with diabetic nephropathy: Secondary | ICD-10-CM

## 2019-01-07 MED ORDER — BD SWAB SINGLE USE REGULAR PADS: 1.0000 | MEDICATED_PAD | Freq: Every day | 1 refills | Status: DC

## 2019-01-07 MED ORDER — ACCU-CHEK AVIVA PLUS VI STRP: ORAL_STRIP | 0 refills | Status: DC

## 2019-01-07 NOTE — Telephone Encounter (Signed)
[employer] states patient is requesting a 90 day supply of Accu-Chek [place] Plus Test Strips and BD swabs.

## 2019-02-15 ENCOUNTER — Other Ambulatory Visit: Payer: Self-pay | Admitting: Family Medicine

## 2019-02-15 DIAGNOSIS — E785 Hyperlipidemia, unspecified: Secondary | ICD-10-CM

## 2019-02-26 ENCOUNTER — Other Ambulatory Visit: Payer: Self-pay | Admitting: Family Medicine

## 2019-02-26 DIAGNOSIS — E1121 Type 2 diabetes mellitus with diabetic nephropathy: Secondary | ICD-10-CM

## 2019-02-26 DIAGNOSIS — I10 Essential (primary) hypertension: Secondary | ICD-10-CM

## 2019-03-09 ENCOUNTER — Telehealth: Payer: Self-pay | Admitting: Family Medicine

## 2019-03-09 NOTE — Chronic Care Management (AMB) (Signed)
Chronic Care Management   Note  03/09/2019 Name: Christine Roberts MRN: [id number] DOB: 1945/09/16  Christine Roberts is a 73 y.o. year old female who is a primary care patient of Steele Sizer, MD. I reached out to Christine Roberts by phone today in response to a referral sent by Christine Roberts health plan.    [name] was given information about Chronic Care Management services today including:  1. CCM service includes personalized support from designated clinical staff supervised by her physician, including individualized plan of care and coordination with other care providers 2. 24/7 contact phone numbers for assistance for urgent and routine care needs. 3. Service will only be billed when office clinical staff spend 20 minutes or more in a month to coordinate care. 4. Only one practitioner may furnish and bill the service in a calendar month. 5. The patient may stop CCM services at any time (effective at the end of the month) by phone call to the office staff. 6. The patient will be responsible for cost sharing (co-pay) of up to 20% of the service fee (after annual deductible is met).  Patient agreed to services and verbal consent obtained.   Follow up plan: Telephone appointment with CCM team member scheduled for: 09/0/2020  [place]  ??[email or web address]   ??[phone]

## 2019-03-10 ENCOUNTER — Ambulatory Visit: Payer: Medicare HMO | Admitting: Pharmacist

## 2019-03-10 ENCOUNTER — Telehealth: Payer: Medicare HMO

## 2019-03-10 DIAGNOSIS — E1121 Type 2 diabetes mellitus with diabetic nephropathy: Secondary | ICD-10-CM

## 2019-03-10 DIAGNOSIS — I10 Essential (primary) hypertension: Secondary | ICD-10-CM

## 2019-03-10 DIAGNOSIS — E785 Hyperlipidemia, unspecified: Secondary | ICD-10-CM

## 2019-03-11 NOTE — Chronic Care Management (AMB) (Signed)
Chronic Care Management   Initial Visit Note  03/11/2019 Name: Christine Roberts MRN: [id number] DOB: 13-Aug-1945  Referred by: Steele Sizer, MD Reason for referral : Chronic Care Management (Initial pharmacy )   Christine Roberts is a 73 y.o. year old female who is a primary care patient of Steele Sizer, MD. The CCM team was consulted for assistance with chronic disease management and care coordination needs.   Review of patient status, including review of consultants reports, relevant laboratory and other test results, and collaboration with appropriate care team members and the patient's provider was performed as part of comprehensive patient evaluation and provision of chronic care management services.    SDOH (Social Determinants of Health) screening performed today: None. See Care Plan for related entries.   Medications: Outpatient Encounter Medications as of 03/10/2019  Medication Sig  . ACCU-CHEK SOFTCLIX LANCETS lancets 1 each by Other route daily.  . Alcohol Swabs (B-D SINGLE USE SWABS REGULAR) PADS 1 each by Does not apply route daily.  Marland Kitchen aspirin EC 81 MG tablet Take 1 tablet (81 mg total) by mouth daily.  Marland Kitchen atorvastatin (LIPITOR) 40 MG tablet TAKE 1 TABLET EVERY WEEK  . blood glucose meter kit and supplies KIT Dispense based on patient and insurance preference. Use up to four times daily as directed. (FOR ICD-9 250.00, 250.01).  . Blood Glucose Monitoring Suppl (ACCU-CHEK AVIVA) device Use as instructed  . calcium citrate (CALCITRATE - DOSED IN MG ELEMENTAL CALCIUM) 950 MG tablet Take 200 mg of elemental calcium by mouth daily.  . fluticasone (FLONASE) 50 MCG/ACT nasal spray Place 2 sprays into both nostrils daily.  Marland Kitchen glucose blood (ACCU-CHEK AVIVA PLUS) test strip TEST BLOOD SUGAR UP TO FOUR TIMES DAILY AS DIRECTED  . Insulin Degludec (TRESIBA FLEXTOUCH) 200 UNIT/ML SOPN Inject 50 Units into the skin daily.  . Insulin Pen Needle (NOVOFINE) 30G X 8 MM MISC Inject 10 each into the  skin as needed.  Elmore Guise Devices MISC 1 Units by Does not apply route 2 (two) times daily.  Marland Kitchen lisinopril (ZESTRIL) 20 MG tablet TAKE 1 TABLET EVERY DAY  . metFORMIN (GLUCOPHAGE-XR) 750 MG 24 hr tablet Take 1 tablet (750 mg total) by mouth daily with breakfast.  . polyethylene glycol powder (GLYCOLAX/MIRALAX) powder Take 17 g by mouth 2 (two) times daily as needed.   No facility-administered encounter medications on file as of 03/10/2019.      Objective:  Lab Results  Component Value Date   HGBA1C 7.7 (H) 11/23/2018   HGBA1C 9.1 (A) 07/21/2018   HGBA1C 8.1 (A) 03/30/2018   Lab Results  Component Value Date   MICROALBUR <0.2 11/23/2018   [place] 82 11/23/2018   CREATININE 0.69 11/23/2018    Goals Addressed            This Visit's Progress   . Donut hole (pt-stated)       Current Barriers:  . financial  Pharmacist Clinical Goal(s): Over the next 14 days, [name] will provide the necessary supplementary documents (proof of out of pocket prescription expenditure, proof of household income) needed for medication assistance applications to CCM pharmacist.   Interventions: . Collaborate with Dr. Ancil Boozer regarding patient's eligibility to switch to Xultophy o Used and liked Victoza in the past, stopped due to cost . CCM pharmacist will apply for medication assistance program for [name] made by [name] and prescribed by Dr. Ancil Boozer.   Patient Self Care Activities:  Marland Kitchen Gather necessary documents needed to apply for medication  assistance  Initial goal documentation        Plan:  Recommendations discussed with provider:  - would patient be a good candidate to switch to Xultophy from [place]  Recommendations discussed with patient:  - gather W2 to prepare for medication assistance application  Follow up  Telephone follow up appointment with care management team member scheduled for: 2 weeks with pharmd  Ruben Reason, PharmD Clinical Pharmacist [place] [phone]

## 2019-03-11 NOTE — Patient Instructions (Signed)
  Thank you allowing the Chronic Care Management Team to be a part of your care!   Goals Addressed            This Visit's Progress   . Donut hole (pt-stated)       Current Barriers:  . financial  Pharmacist Clinical Goal(s): Over the next 14 days, [name] will provide the necessary supplementary documents (proof of out of pocket prescription expenditure, proof of household income) needed for medication assistance applications to CCM pharmacist.   Interventions: . Collaborate with Dr. Ancil Boozer regarding patient's eligibility to switch to Xultophy o Used and liked Victoza in the past, stopped due to cost . CCM pharmacist will apply for medication assistance program for Christine Roberts made by Christine Roberts and prescribed by Dr. Ancil Boozer.   Patient Self Care Activities:  Marland Kitchen Gather necessary documents needed to apply for medication assistance  Initial goal documentation        Please call a member of the CCM (Chronic Care Management) Team with any questions or case management needs:   Suzie Portela, RN, BSN Nurse Care Coordinator  [phone]  Ruben Reason, PharmD  Clinical Pharmacist  [phone]  Elliot Gurney, [place] Clinical Social Worker [phone]

## 2019-03-12 ENCOUNTER — Other Ambulatory Visit: Payer: Self-pay | Admitting: *Deleted

## 2019-03-12 DIAGNOSIS — N200 Calculus of kidney: Secondary | ICD-10-CM

## 2019-03-16 ENCOUNTER — Telehealth: Payer: Self-pay

## 2019-03-16 ENCOUNTER — Other Ambulatory Visit
Admission: RE | Admit: 2019-03-16 | Discharge: 2019-03-16 | Disposition: A | Discharge status: Home / Self Care | Payer: Medicare HMO | Source: Home / Self Care | Attending: Urology | Admitting: Urology

## 2019-03-16 ENCOUNTER — Ambulatory Visit
Admission: RE | Admit: 2019-03-16 | Discharge: 2019-03-16 | Disposition: A | Discharge status: Home / Self Care | Payer: Medicare HMO | Attending: Urology | Admitting: Urology

## 2019-03-16 ENCOUNTER — Ambulatory Visit: Payer: Medicare HMO | Admitting: Urology

## 2019-03-16 ENCOUNTER — Ambulatory Visit
Admission: RE | Admit: 2019-03-16 | Discharge: 2019-03-16 | Disposition: A | Discharge status: Home / Self Care | Payer: Medicare HMO | Source: Ambulatory Visit | Attending: Urology | Admitting: Urology

## 2019-03-16 ENCOUNTER — Encounter: Payer: Self-pay | Admitting: Urology

## 2019-03-16 ENCOUNTER — Other Ambulatory Visit: Payer: Self-pay

## 2019-03-16 VITALS — BP 151/72 | HR 80 | Ht 63.5 in | Wt 200.0 lb

## 2019-03-16 DIAGNOSIS — N200 Calculus of kidney: Secondary | ICD-10-CM

## 2019-03-16 LAB — URINALYSIS, COMPLETE (UACMP) WITH MICROSCOPIC
Bacteria, UA: NONE SEEN
Bilirubin Urine: NEGATIVE
Glucose, UA: 100 mg/dL — AB
Hgb urine dipstick: NEGATIVE
Ketones, ur: NEGATIVE mg/dL
Leukocytes,Ua: NEGATIVE
Nitrite: NEGATIVE
Protein, ur: NEGATIVE mg/dL
RBC / HPF: NONE SEEN RBC/hpf (ref 0–5)
Specific Gravity, Urine: 1.025 (ref 1.005–1.030)
pH: 5 (ref 5.0–8.0)

## 2019-03-16 MED ORDER — TAMSULOSIN HCL 0.4 MG PO CAPS: 0.4000 mg | ORAL_CAPSULE | Freq: Every day | ORAL | 0 refills | Status: DC

## 2019-03-16 NOTE — Patient Instructions (Signed)
Kidney Stones  Kidney stones (urolithiasis) are rock-like masses that form inside of the kidneys. Kidneys are organs that make pee (urine). A kidney stone can cause very bad pain and can block the flow of pee. The stone usually leaves your body (passes) through your pee. You may need to have a doctor take out the stone. Follow these instructions at home: Eating and drinking  Drink enough fluid to keep your pee clear or pale yellow. This will help you pass the stone.  If told by your doctor, change the foods you eat (your diet). This may include: ? Limiting how much salt (sodium) you eat. ? Eating more fruits and vegetables. ? Limiting how much meat, poultry, fish, and eggs you eat.  Follow instructions from your doctor about eating or drinking restrictions. General instructions  Collect pee samples as told by your doctor. You may need to collect a pee sample: ? 24 hours after a stone comes out. ? 8-12 weeks after a stone comes out, and every 6-12 months after that.  Strain your pee every time you pee (urinate), for as long as told. Use the strainer that your doctor recommends.  Do not throw out the stone. Keep it so that it can be tested by your doctor.  Take over-the-counter and prescription medicines only as told by your doctor.  Keep all follow-up visits as told by your doctor. This is important. You may need follow-up tests. Preventing kidney stones To prevent another kidney stone:  Drink enough fluid to keep your pee clear or pale yellow. This is the best way to prevent kidney stones.  Eat healthy foods.  Avoid certain foods as told by your doctor. You may be told to eat less protein.  Stay at a healthy weight. Contact a doctor if:  You have pain that gets worse or does not get better with medicine. Get help right away if:  You have a fever or chills.  You get very bad pain.  You get new pain in your belly (abdomen).  You pass out (faint).  You cannot pee. This  information is not intended to replace advice given to you by your health care provider. Make sure you discuss any questions you have with your health care provider. Document Released: 12/11/2007 Document Revised: 02/03/2018 Document Reviewed: 03/12/2016 Elsevier Patient Education  2020 Elsevier Inc.   Dietary Guidelines to Help Prevent Kidney Stones Kidney stones are deposits of minerals and salts that form inside your kidneys. Your risk of developing kidney stones may be greater depending on your diet, your lifestyle, the medicines you take, and whether you have certain medical conditions. Most people can reduce their chances of developing kidney stones by following the instructions below. Depending on your overall health and the type of kidney stones you tend to develop, your dietitian may give you more specific instructions. What are tips for following this plan? Reading food labels  Choose foods with "no salt added" or "low-salt" labels. Limit your sodium intake to less than 1500 mg per day.  Choose foods with calcium for each meal and snack. Try to eat about 300 mg of calcium at each meal. Foods that contain 200-500 mg of calcium per serving include: ? 8 oz (237 ml) of milk, fortified nondairy milk, and fortified fruit juice. ? 8 oz (237 ml) of kefir, yogurt, and soy yogurt. ? 4 oz (118 ml) of tofu. ? 1 oz of cheese. ? 1 cup (300 g) of dried figs. ? 1 cup (91   g) of cooked broccoli. ? 1-3 oz can of sardines or mackerel.  Most people need 1000 to 1500 mg of calcium each day. Talk to your dietitian about how much calcium is recommended for you. Shopping  Buy plenty of fresh fruits and vegetables. Most people do not need to avoid fruits and vegetables, even if they contain nutrients that may contribute to kidney stones.  When shopping for convenience foods, choose: ? Whole pieces of fruit. ? Premade salads with dressing on the side. ? Low-fat fruit and yogurt smoothies.  Avoid  buying frozen meals or prepared deli foods.  Look for foods with live cultures, such as yogurt and kefir. Cooking  Do not add salt to food when cooking. Place a salt shaker on the table and allow each person to add his or her own salt to taste.  Use vegetable protein, such as beans, textured vegetable protein (TVP), or tofu instead of meat in pasta, casseroles, and soups. Meal planning   Eat less salt, if told by your dietitian. To do this: ? Avoid eating processed or premade food. ? Avoid eating fast food.  Eat less animal protein, including cheese, meat, poultry, or fish, if told by your dietitian. To do this: ? Limit the number of times you have meat, poultry, fish, or cheese each week. Eat a diet free of meat at least 2 days a week. ? Eat only one serving each day of meat, poultry, fish, or seafood. ? When you prepare animal protein, cut pieces into small portion sizes. For most meat and fish, one serving is about the size of one deck of cards.  Eat at least 5 servings of fresh fruits and vegetables each day. To do this: ? Keep fruits and vegetables on hand for snacks. ? Eat 1 piece of fruit or a handful of berries with breakfast. ? Have a salad and fruit at lunch. ? Have two kinds of vegetables at dinner.  Limit foods that are high in a substance called oxalate. These include: ? Spinach. ? Rhubarb. ? Beets. ? Potato chips and french fries. ? Nuts.  If you regularly take a diuretic medicine, make sure to eat at least 1-2 fruits or vegetables high in potassium each day. These include: ? Avocado. ? Banana. ? Orange, prune, carrot, or tomato juice. ? Baked potato. ? Cabbage. ? Beans and split peas. General instructions   Drink enough fluid to keep your urine clear or pale yellow. This is the most important thing you can do.  Talk to your health care provider and dietitian about taking daily supplements. Depending on your health and the cause of your kidney stones, you  may be advised: ? Not to take supplements with vitamin C. ? To take a calcium supplement. ? To take a daily probiotic supplement. ? To take other supplements such as magnesium, fish oil, or vitamin B6.  Take all medicines and supplements as told by your health care provider.  Limit alcohol intake to no more than 1 drink a day for nonpregnant women and 2 drinks a day for men. One drink equals 12 oz of beer, 5 oz of wine, or 1 oz of hard liquor.  Lose weight if told by your health care provider. Work with your dietitian to find strategies and an eating plan that works best for you. What foods are not recommended? Limit your intake of the following foods, or as told by your dietitian. Talk to your dietitian about specific foods you should avoid based   on the type of kidney stones and your overall health. Grains Breads. Bagels. Rolls. Baked goods. Salted crackers. Cereal. Pasta. Vegetables Spinach. Rhubarb. Beets. Canned vegetables. Pickles. Olives. Meats and other protein foods Nuts. Nut butters. Large portions of meat, poultry, or fish. Salted or cured meats. Deli meats. Hot dogs. Sausages. Dairy Cheese. Beverages Regular soft drinks. Regular vegetable juice. Seasonings and other foods Seasoning blends with salt. Salad dressings. Canned soups. Soy sauce. Ketchup. Barbecue sauce. Canned pasta sauce. Casseroles. Pizza. Lasagna. Frozen meals. Potato chips. French fries. Summary  You can reduce your risk of kidney stones by making changes to your diet.  The most important thing you can do is drink enough fluid. You should drink enough fluid to keep your urine clear or pale yellow.  Ask your health care provider or dietitian how much protein from animal sources you should eat each day, and also how much salt and calcium you should have each day. This information is not intended to replace advice given to you by your health care provider. Make sure you discuss any questions you have with your  health care provider. Document Released: 10/19/2010 Document Revised: 10/14/2018 Document Reviewed: 06/04/2016 Elsevier Patient Education  2020 Elsevier Inc.  

## 2019-03-16 NOTE — Telephone Encounter (Signed)
Called pt no answer. Left detailed message per DPR for pt informing her of the information below. Advised pt to call back for questions or concerns.

## 2019-03-16 NOTE — Telephone Encounter (Signed)
-----   Message from Billey Co, MD sent at 03/16/2019  9:35 AM EDT ----- Her stone hasnt moved on her xray from CT in November, and is not causing any blockage. Her urine is also totally negative with no blood or infection. Can cancel the renal ultrasound. Her pain is not from the stone, would recommend ice/heat and NSAIDs, please schedule follow up in one year with kub.  Nickolas Madrid, MD 03/16/2019

## 2019-03-16 NOTE — Progress Notes (Signed)
03/16/19 8:28 AM   [name] [birth year] [id number]  Referring provider: Steele Sizer, MD [address] [place],  [place] [address]  CC: Nephrolithiasis  HPI: I saw Ms. Owens Shark in urology clinic today in consultation for nephrolithiasis from Dr. Ancil Boozer.  She is a 73 year old female with history of diabetes and multiple episodes of nephrolithiasis.  She was previously followed by Dr. Bernardo Heater and Dr. Jacqlyn Larsen.  She has a history of 3 lithotripsies, as well as 1 ureteroscopy on the left side with Dr. Bernardo Heater in 2015.  She did not tolerate her stent well.  She reports a 3-week history of intermittent right-sided flank and groin pain, and reports "she can feel her right-sided stone moving around."  Her most recent imaging was a CT stone protocol in November 2019 and showed a single punctate 1 mm right mid pole stone and no hydronephrosis.  There were extrarenal pelvis bilaterally.  She denies any gross hematuria, fevers, chills, dysuria.  Her pain is worse in the morning when she gets out of bed.  There are no aggravating or alleviating factors.  Severity is mild to moderate.   PMH: Past Medical History:  Diagnosis Date  . Diabetes mellitus without complication ([place])   . Hyperlipidemia   . Hypertension   . Kidney stone   . Left shoulder pain   . Obesity   . Osteoarthritis   . Ovarian failure   . Stroke ([place])   . Vitamin B12 deficiency (non anemic)     Surgical History: Past Surgical History:  Procedure Laterality Date  . ABDOMINAL HYSTERECTOMY    . CHOLECYSTECTOMY    . COLONOSCOPY  2015  . COLONOSCOPY WITH PROPOFOL N/A 03/04/2018   Procedure: COLONOSCOPY WITH PROPOFOL;  Surgeon: Toledo, Benay Pike, MD;  Location: [facility] ENDOSCOPY;  Service: Gastroenterology;  Laterality: N/A;  . cystoscopic     stent placed in ureter  . LITHOTRIPSY  12/2007  . MYRINGOTOMY Right 11/2009    Allergies:  Allergies  Allergen Reactions  . Latex Rash  . Erythromycin Other (See Comments)    Strawberry tongue    Family History: Family History  Problem Relation Age of Onset  . Cancer Mother   . Diabetes Mother   . Diabetes Daughter   . CAD Father   . Diabetes Daughter        hypoglycemia    Social History:  reports that she has never smoked. She has never used smokeless tobacco. She reports current alcohol use of about 1.0 standard drinks of alcohol per week. She reports that she does not use drugs.  ROS: Please see flowsheet from today's date for complete review of systems.  Physical Exam: BP (!) 151/72 (BP Location: Left Arm, Patient Position: Sitting, Cuff Size: Normal)   Pulse 80   Ht 5' 3.5" (1.613 m)   Wt 200 lb (90.7 kg)   BMI 34.87 kg/m    Constitutional:  Alert and oriented, No acute distress. Cardiovascular: No clubbing, cyanosis, or edema. Respiratory: Normal respiratory effort, no increased work of breathing. GI: Abdomen is soft, nontender, nondistended, no abdominal masses GU: No CVA tenderness Lymph: No cervical or inguinal lymphadenopathy. Skin: No rashes, bruises or suspicious lesions. Neurologic: Grossly intact, no focal deficits, moving all 4 extremities. Psychiatric: Normal mood and affect.  Laboratory Data: Patient unable to give a urine sample today  Pertinent Imaging: I have personally reviewed the CT renal stone study from 05/22/2018, extrarenal pelvis bilaterally, small 55mm right lower pole stone.  Assessment &  Plan:   In summary, the patient is a 73 year old female with extensive history of nephrolithiasis, known small 1 mm right-sided mid pole stone, and 3 weeks of right-sided back pain.  Her symptoms and pain are more consistent with musculoskeletal than acute renal colic, however we discussed at length that we cannot rule out a stone without further imaging.  We discussed the differences in cost and radiation between CT versus KUB and ultrasound.  She would like to proceed with KUB and ultrasound.  We discussed the use of NSAIDs for  back pain related to either kidney stones or musculoskeletal.  I also recommended a short course of Flomax in case this is a small stone passing on the right side.  We discussed the greater than 90% spontaneous passage rate if this was the small right-sided stone moving down into the ureter.  KUB and ultrasound to evaluate for right-sided stone, will call with results We discussed return precautions including fevers, chills, or uncontrolled flank pain   Billey Co, [place] [address] [place],  [address] [phone]

## 2019-03-17 ENCOUNTER — Telehealth: Payer: Self-pay | Admitting: Urology

## 2019-03-17 NOTE — Telephone Encounter (Signed)
-----   Message from Billey Co, MD sent at 03/16/2019  9:35 AM EDT ----- Her stone hasnt moved on her xray from CT in November, and is not causing any blockage. Her urine is also totally negative with no blood or infection. Can cancel the renal ultrasound. Her pain is not from the stone, would recommend ice/heat and NSAIDs, please schedule follow up in one year with kub.  Nickolas Madrid, MD 03/16/2019

## 2019-03-17 NOTE — Telephone Encounter (Signed)
App made and mailed ° °Christine Roberts ° °

## 2019-03-18 DIAGNOSIS — Z1231 Encounter for screening mammogram for malignant neoplasm of breast: Secondary | ICD-10-CM | POA: Diagnosis not present

## 2019-03-18 LAB — HM MAMMOGRAPHY: HM Mammogram: NORMAL (ref 0–4)

## 2019-03-19 ENCOUNTER — Encounter: Payer: Self-pay | Admitting: Family Medicine

## 2019-03-23 ENCOUNTER — Encounter: Payer: Self-pay | Admitting: Family Medicine

## 2019-03-23 ENCOUNTER — Ambulatory Visit (INDEPENDENT_AMBULATORY_CARE_PROVIDER_SITE_OTHER): Payer: Medicare HMO | Admitting: Family Medicine

## 2019-03-23 ENCOUNTER — Other Ambulatory Visit: Payer: Self-pay

## 2019-03-23 VITALS — Temp 97.6°F | Wt 201.0 lb

## 2019-03-23 DIAGNOSIS — E538 Deficiency of other specified B group vitamins: Secondary | ICD-10-CM

## 2019-03-23 DIAGNOSIS — E1121 Type 2 diabetes mellitus with diabetic nephropathy: Secondary | ICD-10-CM

## 2019-03-23 DIAGNOSIS — N200 Calculus of kidney: Secondary | ICD-10-CM

## 2019-03-23 DIAGNOSIS — I700 Atherosclerosis of aorta: Secondary | ICD-10-CM | POA: Diagnosis not present

## 2019-03-23 DIAGNOSIS — D692 Other nonthrombocytopenic purpura: Secondary | ICD-10-CM

## 2019-03-23 DIAGNOSIS — E785 Hyperlipidemia, unspecified: Secondary | ICD-10-CM

## 2019-03-23 DIAGNOSIS — Z8673 Personal history of transient ischemic attack (TIA), and cerebral infarction without residual deficits: Secondary | ICD-10-CM | POA: Diagnosis not present

## 2019-03-23 DIAGNOSIS — I10 Essential (primary) hypertension: Secondary | ICD-10-CM

## 2019-03-23 NOTE — Progress Notes (Signed)
Name: Christine Roberts   MRN: [id number]    DOB: 06-27-1946   Date:03/23/2019       Progress Note  Subjective  Chief Complaint  Chief Complaint  Patient presents with   Diabetes   Hypertension   Hyperlipidemia    I connected with  Christine Roberts  on 03/23/19 at  3:40 PM EDT by a video enabled telemedicine application and verified that I am speaking with the correct person using two identifiers.  I discussed the limitations of evaluation and management by telemedicine and the availability of in person appointments. The patient expressed understanding and agreed to proceed. Staff also discussed with the patient that there may be a patient responsible charge related to this service. Patient Location: at home  Provider Location: [place]    HPI  DMII: she is on Metformin, Tresibanow at 36  units . She is offVictoza since the Summer because she reached the doughnut hole, she is talking to Almyra Free Hedricks to see if she can qualify for assistance programShe is on ace for microalbuminuria and denies side effects of medications. Last A1C was 7.7% Fasting has been well controlled in the past 3 months below 130;s fasting, she is taking Metformin 1500 mg and Tresiba 36 units . She has been cooking at home and eating less, upset that she gained weight.   HTN - taking medication, no side effects, no chest pain, dizzinessor palpitation. Unchanged   Obesity:weight is gradually going up again, discussed healthy diet again   Hyperlipidemia: taking lipitor one time a week to avoid side effects, otherwise she has leg pains. Unchanged   TIA versus Complicated migraine: she developed dizziness, headache and aphasia on 11/16 - she was transported by EMS to [facility], she had normal Echo, CT, MRI of brain and carotid dopplers. She was seen by neurologist and advised to continue on aspirin and lipitor in case it was a TIA, but likely a complicated migraine. She has been asymptomatic since     B12 deficiency:still not back on supplementation, reviewed labs with patient   Nephrolithiasis: small on on right kidney and urologist does not think it is the cause of her pain  Senile purpura: on both arms, she states arms very bad, skin very thin  Atherosclerosis of aorta: continue statin therapy and aspirin 81 mg daily, only taking atorvastatin once a week and is doing well   Patient Active Problem List   Diagnosis Date Noted   Morbid obesity ([place]) 07/21/2018   Atherosclerosis of aorta ([place]) 05/25/2018   Seasonal allergic rhinitis 09/12/2015   TIA (transient ischemic attack) 05/24/2015   Senile purpura ([place]) 05/09/2015   Benign essential HTN 03/18/2015   Pain in shoulder 03/18/2015   Dyslipidemia 03/18/2015   History of surgery to major organs, presenting hazards to health 03/18/2015   Hearing loss 03/18/2015   Hiatal hernia 03/18/2015   History of colon polyps 03/18/2015   Calculus of kidney 03/18/2015   B12 deficiency 03/18/2015   Microalbuminuria 03/18/2015   Adult BMI 30+ 03/18/2015   Arthritis, degenerative 03/18/2015   Diabetes mellitus with nephropathy ([place]) 03/18/2015    Past Surgical History:  Procedure Laterality Date   ABDOMINAL HYSTERECTOMY     CHOLECYSTECTOMY     COLONOSCOPY  2015   COLONOSCOPY WITH PROPOFOL N/A 03/04/2018   Procedure: COLONOSCOPY WITH PROPOFOL;  Surgeon: Toledo, Benay Pike, MD;  Location: [facility] ENDOSCOPY;  Service: Gastroenterology;  Laterality: N/A;   cystoscopic     stent placed in ureter  LITHOTRIPSY  12/2007   MYRINGOTOMY Right 11/2009    Family History  Problem Relation Age of Onset   Cancer Mother    Diabetes Mother    Diabetes Daughter    CAD Father    Diabetes Daughter        hypoglycemia    Social History   Socioeconomic History   Marital status: Widowed    Spouse name: Not on file   Number of children: 2   Years of education: Not on file   Highest education level:  Associate degree: occupational, Hotel manager, or vocational program  Occupational History    Employer: [employer]  Social Designer, fashion/clothing strain: Not hard at all   Food insecurity    Worry: Never true    Inability: Never true   Transportation needs    Medical: No    Non-medical: No  Tobacco Use   Smoking status: Never Smoker   Smokeless tobacco: Never Used   Tobacco comment: smoking cessation materials not required  Substance and Sexual Activity   Alcohol use: Yes    Alcohol/week: 1.0 standard drinks    Types: 1 Shots of liquor per week    Comment: socially   Drug use: No   Sexual activity: Not Currently  Lifestyle   Physical activity    Days per week: 3 days    Minutes per session: 60 min   Stress: Not at all  Relationships   Social connections    Talks on phone: More than three times a week    Gets together: Three times a week    Attends religious service: More than 4 times per year    Active member of club or organization: No    Attends meetings of clubs or organizations: Never    Relationship status: Widowed   Intimate partner violence    Fear of current or ex partner: No    Emotionally abused: No    Physically abused: No    Forced sexual activity: No  Other Topics Concern   Not on file  Social History Narrative   Not on file     Current Outpatient Medications:    ACCU-CHEK SOFTCLIX LANCETS lancets, 1 each by Other route daily., Disp: 100 each, Rfl: 1   Alcohol Swabs (B-D SINGLE USE SWABS REGULAR) PADS, 1 each by Does not apply route daily., Disp: 400 each, Rfl: 1   aspirin EC 81 MG tablet, Take 1 tablet (81 mg total) by mouth daily., Disp: 90 tablet, Rfl: 0   atorvastatin (LIPITOR) 40 MG tablet, TAKE 1 TABLET EVERY WEEK, Disp: 12 tablet, Rfl: 0   blood glucose meter kit and supplies KIT, Dispense based on patient and insurance preference. Use up to four times daily as directed. (FOR ICD-9 250.00, 250.01)., Disp: 1  each, Rfl: 0   Blood Glucose Monitoring Suppl (ACCU-CHEK AVIVA) device, Use as instructed, Disp: 1 each, Rfl: 0   calcium citrate (CALCITRATE - DOSED IN MG ELEMENTAL CALCIUM) 950 MG tablet, Take 200 mg of elemental calcium by mouth daily., Disp: , Rfl:    fluticasone (FLONASE) 50 MCG/ACT nasal spray, Place 2 sprays into both nostrils daily., Disp: 16 g, Rfl: 6   glucose blood (ACCU-CHEK AVIVA PLUS) test strip, TEST BLOOD SUGAR UP TO FOUR TIMES DAILY AS DIRECTED, Disp: 400 each, Rfl: 0   Insulin Degludec (TRESIBA FLEXTOUCH) 200 UNIT/ML SOPN, Inject 50 Units into the skin daily., Disp: 27 mL, Rfl: 0   Insulin Pen Needle (NOVOFINE) 30G  X 8 MM MISC, Inject 10 each into the skin as needed., Disp: 100 each, Rfl: 2   Lancet Devices MISC, 1 Units by Does not apply route 2 (two) times daily., Disp: 100 each, Rfl: 3   lisinopril (ZESTRIL) 20 MG tablet, TAKE 1 TABLET EVERY DAY, Disp: 90 tablet, Rfl: 0   metFORMIN (GLUCOPHAGE-XR) 750 MG 24 hr tablet, Take 1 tablet (750 mg total) by mouth daily with breakfast., Disp: 180 tablet, Rfl: 1   polyethylene glycol powder (GLYCOLAX/MIRALAX) powder, Take 17 g by mouth 2 (two) times daily as needed., Disp: 3350 g, Rfl: 1   tamsulosin (FLOMAX) 0.4 MG CAPS capsule, Take 1 capsule (0.4 mg total) by mouth daily., Disp: 12 capsule, Rfl: 0  Allergies  Allergen Reactions   Latex Rash   Erythromycin Other (See Comments)    Strawberry tongue    I personally reviewed active problem list, medication list, allergies, family history, social history, health maintenance with the patient/caregiver today.   ROS  Ten systems reviewed and is negative except as mentioned in HPI   Objective  Virtual encounter, vitals not obtained.  There is no height or weight on file to calculate BMI.  Physical Exam  Awake, alert and oriented   PHQ2/9: Depression screen [facility] 2/9 03/23/2019 11/20/2018 07/21/2018 03/30/2018 07/18/2017  Decreased Interest 0 0 0 0 0  Down,  Depressed, Hopeless 0 0 0 0 0  PHQ - 2 Score 0 0 0 0 0  Altered sleeping 0 0 - - 0  Tired, decreased energy 0 0 - - 0  Change in appetite 0 0 - - 0  Feeling bad or failure about yourself  0 0 - - 0  Trouble concentrating 0 0 - - 0  Moving slowly or fidgety/restless 0 0 - - 0  Suicidal thoughts 0 0 - - 0  PHQ-9 Score 0 0 - - 0  Difficult doing work/chores - - - - Not difficult at all   PHQ-2/9 Result is negative.    Fall Risk: Fall Risk  03/23/2019 11/20/2018 07/21/2018 06/02/2018 03/30/2018  Falls in the past year? 0 0 0 0 No  Number falls in past yr: 0 0 0 0 -  Injury with Fall? 0 0 - - -  Risk for fall due to : - - - - -  Follow up - - - - -    Assessment & Plan  1. Benign essential HTN  - lisinopril (ZESTRIL) 20 MG tablet; Take 1 tablet (20 mg total) by mouth daily.  Dispense: 90 tablet; Refill: 0  2. Atherosclerosis of aorta ([place])   3. Dyslipidemia  - atorvastatin (LIPITOR) 40 MG tablet; Take 1 tablet (40 mg total) by mouth once a week.  Dispense: 12 tablet; Refill: 0  4. Senile purpura ([facility])  stable  5. Type 2 diabetes with nephropathy ([facility])  - lisinopril (ZESTRIL) 20 MG tablet; Take 1 tablet (20 mg total) by mouth daily.  Dispense: 90 tablet; Refill: 0 - metFORMIN (GLUCOPHAGE-XR) 750 MG 24 hr tablet; Take 1 tablet (750 mg total) by mouth daily with breakfast.  Dispense: 180 tablet; Refill: 1  6. Calcium nephrolithiasis  Seeing Urologist   7. Hx-TIA (transient ischemic attack)   8. Kidney stone on right side   9. B12 deficiency   I discussed the assessment and treatment plan with the patient. The patient was provided an opportunity to ask questions and all were answered. The patient agreed with the plan and demonstrated an understanding of the instructions.  The patient was advised to call back or seek an in-person evaluation if the symptoms worsen or if the condition fails to improve as anticipated.  I provided 25  minutes of non-face-to-face time  during this encounter.

## 2019-03-24 ENCOUNTER — Other Ambulatory Visit: Payer: Self-pay

## 2019-03-24 ENCOUNTER — Ambulatory Visit: Payer: Self-pay | Admitting: Pharmacist

## 2019-03-24 ENCOUNTER — Ambulatory Visit (INDEPENDENT_AMBULATORY_CARE_PROVIDER_SITE_OTHER): Payer: Medicare HMO

## 2019-03-24 VITALS — BP 140/80

## 2019-03-24 DIAGNOSIS — E1121 Type 2 diabetes mellitus with diabetic nephropathy: Secondary | ICD-10-CM | POA: Diagnosis not present

## 2019-03-24 DIAGNOSIS — Z23 Encounter for immunization: Secondary | ICD-10-CM

## 2019-03-24 DIAGNOSIS — E785 Hyperlipidemia, unspecified: Secondary | ICD-10-CM

## 2019-03-24 LAB — POCT GLYCOSYLATED HEMOGLOBIN (HGB A1C): Hemoglobin A1C: 8.1 % — AB (ref 4.0–5.6)

## 2019-03-24 MED ORDER — ATORVASTATIN CALCIUM 40 MG PO TABS: 40.0000 mg | ORAL_TABLET | ORAL | 0 refills | Status: DC

## 2019-03-24 MED ORDER — LISINOPRIL 20 MG PO TABS: 20.0000 mg | ORAL_TABLET | Freq: Every day | ORAL | 0 refills | Status: DC

## 2019-03-24 MED ORDER — METFORMIN HCL ER 750 MG PO TB24: 750.0000 mg | ORAL_TABLET | Freq: Every day | ORAL | 1 refills | Status: DC

## 2019-03-24 NOTE — Progress Notes (Signed)
Patient is here for a blood pressure check. Patient denies chest pain, palpitations, shortness of breath or visual disturbances. Patient had a virtual visit with PCP today. Came in to have blood pressure checked, during nurse visit first check blood pressure was 140/80. She does take blood pressure medications.

## 2019-03-24 NOTE — Chronic Care Management (AMB) (Signed)
  Chronic Care Management   Note  03/24/2019 Name: Christine Roberts MRN: [id number] DOB: 11/07/1945  Pharmacy follow up outreach to patient today regarding medication assistance application for [place]. HIPAA identifiers verified.  Patient had not received application materials in the mail. Will resend application as well as leave an application at the check in desk for her. Reviewed proper supplemental documents needed.   Follow up plan: Telephone follow up appointment with care management team member scheduled for:2 weeks with PharmD  Ruben Reason, PharmD Clinical Pharmacist [facility] [phone]

## 2019-04-07 ENCOUNTER — Ambulatory Visit (INDEPENDENT_AMBULATORY_CARE_PROVIDER_SITE_OTHER): Payer: Medicare HMO | Admitting: Pharmacist

## 2019-04-07 DIAGNOSIS — E1121 Type 2 diabetes mellitus with diabetic nephropathy: Secondary | ICD-10-CM

## 2019-04-10 NOTE — Chronic Care Management (AMB) (Signed)
  Chronic Care Management   Care Coordination Note  04/10/2019- late entry Name: Christine Roberts MRN: [id number] DOB: 1945-12-06  Care coordination- received patient's medication application. Submitted via fax and uploaded under media tab.   Goals Addressed            This Visit's Progress   . Donut hole (pt-stated)       Current Barriers:  . financial  Pharmacist Clinical Goal(s): Over the next 14 days, Christine Roberts will provide the necessary supplementary documents (proof of out of pocket prescription expenditure, proof of household income) needed for medication assistance applications to CCM pharmacist.   Interventions: . Collaborate with Dr. Ancil Boozer regarding patient's eligibility to switch to Xultophy o Used and liked Victoza in the past, stopped due to cost . CCM pharmacist will apply for medication assistance program for Christine Roberts made by [name] and prescribed by Dr. Ancil Boozer.  Marland Kitchen Updated [id number]: application received and submitted to [employer], uploaded under media tab  Patient Self Care Activities:  Marland Kitchen Gather necessary documents needed to apply for medication assistance  Initial goal documentation         Follow up plan: Telephone follow up appointment with care management team member scheduled for: 7-10 days with PharmD  Ruben Reason, PharmD Clinical Pharmacist [facility] [phone]

## 2019-04-14 ENCOUNTER — Ambulatory Visit: Payer: Self-pay | Admitting: Pharmacist

## 2019-04-14 DIAGNOSIS — E1121 Type 2 diabetes mellitus with diabetic nephropathy: Secondary | ICD-10-CM

## 2019-04-21 ENCOUNTER — Ambulatory Visit: Payer: Self-pay | Admitting: Pharmacist

## 2019-04-21 DIAGNOSIS — E1121 Type 2 diabetes mellitus with diabetic nephropathy: Secondary | ICD-10-CM

## 2019-04-21 NOTE — Chronic Care Management (AMB) (Signed)
  Chronic Care Management   Note  04/21/2019- late entry Name: Christine Roberts MRN: [id number] DOB: [birth year]  Care Coordination-   Reviewed patient's returned application to [employer] [name]). Patient provided proper financial records but did not sign all patient signature designations on application.   Goals Addressed            This Visit's Progress   . Donut hole (pt-stated)       Current Barriers:  . financial  Pharmacist Clinical Goal(s): Over the next 14 days, [name] will provide the necessary supplementary documents (proof of out of pocket prescription expenditure, proof of household income) needed for medication assistance applications to CCM pharmacist.   Interventions: . Collaborate with Dr. Ancil Boozer regarding patient's eligibility to switch to Xultophy o Used and liked Victoza in the past, stopped due to cost . CCM pharmacist will apply for medication assistance program for [name] made by [name] and prescribed by Dr. Ancil Boozer.  Marland Kitchen Updated 10/7: patient portion of application missing some information   Patient Self Care Activities:  Marland Kitchen Gather necessary documents needed to apply for medication assistance  Please see past updates related to this goal by clicking on the "Past Updates" button in the selected goal           Follow up plan: The care management team will reach out to the patient again over the next 5-7 days.   Ruben Reason, PharmD Clinical Pharmacist [facility] [phone]

## 2019-04-21 NOTE — Chronic Care Management (AMB) (Signed)
  Chronic Care Management   Note  04/21/2019 Name: Christine Roberts MRN: [id number] DOB: 1946-02-10  73 y.o. year old female referred to Chronic Care Management by Dr. Ancil Boozer for medication assistance [name]).  Was unable to reach patient via telephone today and have left HIPAA compliant voicemail asking patient to return my call. (unsuccessful outreach #1).  Follow up plan: The care management team will reach out to the patient again over the next 3-5 days.   Ruben Reason, PharmD Clinical Pharmacist [facility] [phone]

## 2019-04-22 ENCOUNTER — Ambulatory Visit: Payer: Self-pay | Admitting: Pharmacist

## 2019-04-22 DIAGNOSIS — E1121 Type 2 diabetes mellitus with diabetic nephropathy: Secondary | ICD-10-CM

## 2019-04-22 NOTE — Chronic Care Management (AMB) (Signed)
  Chronic Care Management   Care Coordination Note  04/22/2019 Name: Christine Roberts MRN: [id number] DOB: 1946-05-17  Care coordination: submitted missing elements from [place] application to [name], uploaded under media tab  Goals Addressed            This Visit's Progress   . Donut hole (pt-stated)       Current Barriers:  . financial  Pharmacist Clinical Goal(s): Over the next 14 days, [name] will provide the necessary supplementary documents (proof of out of pocket prescription expenditure, proof of household income) needed for medication assistance applications to CCM pharmacist.   Interventions: . Collaborate with Dr. Ancil Boozer regarding patient's eligibility to switch to Xultophy o Used and liked Victoza in the past, stopped due to cost . CCM pharmacist will apply for medication assistance program for [name] made by [name] and prescribed by Dr. Ancil Boozer.  Marland Kitchen Updated 10/7: patient portion of application missing some information  . Updated 10/15: submitted patient application missing elements, uploaded to media tab  Patient Self Care Activities:  Marland Kitchen Gather necessary documents needed to apply for medication assistance  Please see past updates related to this goal by clicking on the "Past Updates" button in the selected goal          Follow up plan: Telephone follow up appointment with care management team member scheduled for: Wed Oct 21  Ruben Reason, PharmD Clinical Pharmacist [facility] [employer] [phone]

## 2019-05-06 ENCOUNTER — Ambulatory Visit (INDEPENDENT_AMBULATORY_CARE_PROVIDER_SITE_OTHER): Payer: Medicare HMO | Admitting: Pharmacist

## 2019-05-06 DIAGNOSIS — E1121 Type 2 diabetes mellitus with diabetic nephropathy: Secondary | ICD-10-CM

## 2019-05-10 NOTE — Chronic Care Management (AMB) (Signed)
  Chronic Care Management   Note  05/10/2019- late entry Name: DANAYAH HOLDT MRN: [id number] DOB: [birth year]   Notified patient she is overincome for [name] assistance for [place]. Provided information on appeals process.   Goals Addressed            This Visit's Progress   . Donut hole (pt-stated)       Current Barriers:  . financial  Pharmacist Clinical Goal(s): Over the next 14 days, [name] will provide the necessary supplementary documents (proof of out of pocket prescription expenditure, proof of household income) needed for medication assistance applications to CCM pharmacist.   Interventions: . Collaborate with Dr. Ancil Boozer regarding patient's eligibility to switch to Xultophy o Used and liked Victoza in the past, stopped due to cost . CCM pharmacist will apply for medication assistance program for [name] made by [name] and prescribed by Dr. Ancil Boozer.  Marland Kitchen Updated 10/7: patient portion of application missing some information  . Updated 10/15: submitted patient application missing elements, uploaded to media tab . Updated 10/29: patient is over income for [name] program- uploaded under media tab; provided information regarding an appeal  Patient Self Care Activities:  Marland Kitchen Gather necessary documents needed to apply for medication assistance  Please see past updates related to this goal by clicking on the "Past Updates" button in the selected goal         Follow up plan: The patient has been provided with contact information for the care management team and has been advised to call with any health related questions or concerns.    Ruben Reason, PharmD Clinical Pharmacist [facility] [phone]

## 2019-06-24 ENCOUNTER — Telehealth: Payer: Self-pay | Admitting: Family Medicine

## 2019-06-24 NOTE — Telephone Encounter (Signed)
Called patient to inform her that no samples available at this time. I will contact her when we get some.

## 2019-06-24 NOTE — Telephone Encounter (Signed)
Patient would like to know if office has any samples of the medication Insulin Degludec (TRESIBA FLEXTOUCH) 200 UNIT/ML SOPN , as she is currently in the "donut hole". Please advise.

## 2019-06-29 ENCOUNTER — Other Ambulatory Visit: Payer: Self-pay

## 2019-06-29 DIAGNOSIS — E1121 Type 2 diabetes mellitus with diabetic nephropathy: Secondary | ICD-10-CM

## 2019-06-29 MED ORDER — TRESIBA FLEXTOUCH 200 UNIT/ML ~~LOC~~ SOPN: 50.0000 [IU] | PEN_INJECTOR | Freq: Every day | SUBCUTANEOUS | 0 refills | Status: DC

## 2019-06-30 NOTE — Telephone Encounter (Signed)
Pt is aware no samples available and that the office will call her.Pt would like to know what to do about the insulin

## 2019-06-30 NOTE — Telephone Encounter (Signed)
Gave patient a sample of the [place] per Dr. Ancil Boozer for patient to use until we can get [place].

## 2019-07-19 ENCOUNTER — Other Ambulatory Visit: Payer: Self-pay | Admitting: Family Medicine

## 2019-07-19 DIAGNOSIS — I10 Essential (primary) hypertension: Secondary | ICD-10-CM

## 2019-07-19 DIAGNOSIS — E1121 Type 2 diabetes mellitus with diabetic nephropathy: Secondary | ICD-10-CM

## 2019-07-19 NOTE — Telephone Encounter (Signed)
Requested Prescriptions  Pending Prescriptions Disp Refills  . lisinopril (ZESTRIL) 20 MG tablet [Pharmacy Med Name: LISINOPRIL 20 MG Tablet] 90 tablet 0    Sig: TAKE 1 TABLET EVERY DAY     Cardiovascular:  ACE Inhibitors Failed - 07/19/2019  2:37 PM      Failed - Cr in normal range and within 180 days    Creat  Date Value Ref Range Status  11/23/2018 0.69 0.60 - 0.93 mg/dL Final    Comment:    For patients >75 years of age, the reference limit for Creatinine is approximately 13% higher for people identified as African-American. .    Creatinine, Urine  Date Value Ref Range Status  11/23/2018 98 20 - 275 mg/dL Final         Failed - K in normal range and within 180 days    Potassium  Date Value Ref Range Status  11/23/2018 4.2 3.5 - 5.3 mmol/L Final         Failed - Last BP in normal range    BP Readings from Last 1 Encounters:  03/24/19 140/80         Passed - Patient is not pregnant      Passed - Valid encounter within last 6 months    Recent Outpatient Visits          3 months ago Benign essential HTN   [facility] [place], Drue Stager, MD   8 months ago Benign essential HTN   [facility] [place], Drue Stager, MD   12 months ago Type 2 diabetes with nephropathy [facility])   [facility] Steele Sizer, MD   1 year ago Community acquired pneumonia of left lower lobe of lung [facility])   [facility] Sizer, MD   1 year ago Kidney stone on right side   [facility] Steele Sizer, MD      Future Appointments            In 1 week  [place]   In 8 months [facility], Herbert Seta, [place]

## 2019-07-27 ENCOUNTER — Ambulatory Visit (INDEPENDENT_AMBULATORY_CARE_PROVIDER_SITE_OTHER): Payer: Medicare HMO

## 2019-07-27 VITALS — BP 130/84 | Temp 97.8°F | Ht 64.0 in | Wt 198.0 lb

## 2019-07-27 DIAGNOSIS — Z0000 Encounter for general adult medical examination without abnormal findings: Secondary | ICD-10-CM

## 2019-07-27 DIAGNOSIS — Z79899 Other long term (current) drug therapy: Secondary | ICD-10-CM

## 2019-07-27 DIAGNOSIS — Z780 Asymptomatic menopausal state: Secondary | ICD-10-CM | POA: Diagnosis not present

## 2019-07-27 NOTE — Patient Instructions (Signed)
Christine Roberts , Thank you for taking time to come for your Medicare Wellness Visit. I appreciate your ongoing commitment to your health goals. Please review the following plan we discussed and let me know if I can assist you in the future.   Screening recommendations/referrals: Colonoscopy: done 03/04/18 Mammogram: done 03/18/19 Bone Density: done 2011. Please call [phone] to schedule your bone density screening.  Recommended yearly ophthalmology/optometry visit for glaucoma screening and checkup Recommended yearly dental visit for hygiene and checkup  Vaccinations: Influenza vaccine: done 03/24/19 Pneumococcal vaccine: done 03/21/15 Tdap vaccine: done 03/14/18 Shingles vaccine: Shingrix discussed. Please contact your pharmacy for coverage information.   Advanced directives: Please bring a copy of your health care power of attorney and living will to the office at your convenience.  Conditions/risks identified: Recommend drinking 6-8 glasses of water per day.   Next appointment: Please follow up in one year for your Medicare Annual Wellness visit.     Preventive Care 77 Years and Older, Female Preventive care refers to lifestyle choices and visits with your health care provider that can promote health and wellness. What does preventive care include?  A yearly physical exam. This is also called an annual well check.  Dental exams once or twice a year.  Routine eye exams. Ask your health care provider how often you should have your eyes checked.  Personal lifestyle choices, including:  Daily care of your teeth and gums.  Regular physical activity.  Eating a healthy diet.  Avoiding tobacco and drug use.  Limiting alcohol use.  Practicing safe sex.  Taking low-dose aspirin every day.  Taking vitamin and mineral supplements as recommended by your health care provider. What happens during an annual well check? The services and screenings done by your health care provider  during your annual well check will depend on your age, overall health, lifestyle risk factors, and family history of disease. Counseling  Your health care provider may ask you questions about your:  Alcohol use.  Tobacco use.  Drug use.  Emotional well-being.  Home and relationship well-being.  Sexual activity.  Eating habits.  History of falls.  Memory and ability to understand (cognition).  Work and work Statistician.  Reproductive health. Screening  You may have the following tests or measurements:  Height, weight, and BMI.  Blood pressure.  Lipid and cholesterol levels. These may be checked every 5 years, or more frequently if you are over 65 years old.  Skin check.  Lung cancer screening. You may have this screening every year starting at age 91 if you have a 30-pack-year history of smoking and currently smoke or have quit within the past 15 years.  Fecal occult blood test (FOBT) of the stool. You may have this test every year starting at age 35.  Flexible sigmoidoscopy or colonoscopy. You may have a sigmoidoscopy every 5 years or a colonoscopy every 10 years starting at age 16.  Hepatitis C blood test.  Hepatitis B blood test.  Sexually transmitted disease (STD) testing.  Diabetes screening. This is done by checking your blood sugar (glucose) after you have not eaten for a while (fasting). You may have this done every 1-3 years.  Bone density scan. This is done to screen for osteoporosis. You may have this done starting at age 41.  Mammogram. This may be done every 1-2 years. Talk to your health care provider about how often you should have regular mammograms. Talk with your health care provider about your test results, treatment options,  and if necessary, the need for more tests. Vaccines  Your health care provider may recommend certain vaccines, such as:  Influenza vaccine. This is recommended every year.  Tetanus, diphtheria, and acellular pertussis  (Tdap, Td) vaccine. You may need a Td booster every 10 years.  Zoster vaccine. You may need this after age 51.  Pneumococcal 13-valent conjugate (PCV13) vaccine. One dose is recommended after age 3.  Pneumococcal polysaccharide (PPSV23) vaccine. One dose is recommended after age 39. Talk to your health care provider about which screenings and vaccines you need and how often you need them. This information is not intended to replace advice given to you by your health care provider. Make sure you discuss any questions you have with your health care provider. Document Released: 07/21/2015 Document Revised: 03/13/2016 Document Reviewed: 04/25/2015 Elsevier Interactive Patient Education  2017 [place] Prevention in the Home Falls can cause injuries. They can happen to people of all ages. There are many things you can do to make your home safe and to help prevent falls. What can I do on the outside of my home?  Regularly fix the edges of walkways and driveways and fix any cracks.  Remove anything that might make you trip as you walk through a door, such as a raised step or threshold.  Trim any bushes or trees on the path to your home.  Use bright outdoor lighting.  Clear any walking paths of anything that might make someone trip, such as rocks or tools.  Regularly check to see if handrails are loose or broken. Make sure that both sides of any steps have handrails.  Any raised decks and porches should have guardrails on the edges.  Have any leaves, snow, or ice cleared regularly.  Use sand or salt on walking paths during winter.  Clean up any spills in your garage right away. This includes oil or grease spills. What can I do in the bathroom?  Use night lights.  Install grab bars by the toilet and in the tub and shower. Do not use towel bars as grab bars.  Use non-skid mats or decals in the tub or shower.  If you need to sit down in the shower, use a plastic, non-slip  stool.  Keep the floor dry. Clean up any water that spills on the floor as soon as it happens.  Remove soap buildup in the tub or shower regularly.  Attach bath mats securely with double-sided non-slip rug tape.  Do not have throw rugs and other things on the floor that can make you trip. What can I do in the bedroom?  Use night lights.  Make sure that you have a light by your bed that is easy to reach.  Do not use any sheets or blankets that are too big for your bed. They should not hang down onto the floor.  Have a firm chair that has side arms. You can use this for support while you get dressed.  Do not have throw rugs and other things on the floor that can make you trip. What can I do in the kitchen?  Clean up any spills right away.  Avoid walking on wet floors.  Keep items that you use a lot in easy-to-reach places.  If you need to reach something above you, use a strong step stool that has a grab bar.  Keep electrical cords out of the way.  Do not use floor polish or wax that makes floors slippery. If  you must use wax, use non-skid floor wax.  Do not have throw rugs and other things on the floor that can make you trip. What can I do with my stairs?  Do not leave any items on the stairs.  Make sure that there are handrails on both sides of the stairs and use them. Fix handrails that are broken or loose. Make sure that handrails are as long as the stairways.  Check any carpeting to make sure that it is firmly attached to the stairs. Fix any carpet that is loose or worn.  Avoid having throw rugs at the top or bottom of the stairs. If you do have throw rugs, attach them to the floor with carpet tape.  Make sure that you have a light switch at the top of the stairs and the bottom of the stairs. If you do not have them, ask someone to add them for you. What else can I do to help prevent falls?  Wear shoes that:  Do not have high heels.  Have rubber bottoms.  Are  comfortable and fit you well.  Are closed at the toe. Do not wear sandals.  If you use a stepladder:  Make sure that it is fully opened. Do not climb a closed stepladder.  Make sure that both sides of the stepladder are locked into place.  Ask someone to hold it for you, if possible.  Clearly mark and make sure that you can see:  Any grab bars or handrails.  First and last steps.  Where the edge of each step is.  Use tools that help you move around (mobility aids) if they are needed. These include:  Canes.  Walkers.  Scooters.  Crutches.  Turn on the lights when you go into a dark area. Replace any light bulbs as soon as they burn out.  Set up your furniture so you have a clear path. Avoid moving your furniture around.  If any of your floors are uneven, fix them.  If there are any pets around you, be aware of where they are.  Review your medicines with your doctor. Some medicines can make you feel dizzy. This can increase your chance of falling. Ask your doctor what other things that you can do to help prevent falls. This information is not intended to replace advice given to you by your health care provider. Make sure you discuss any questions you have with your health care provider. Document Released: 04/20/2009 Document Revised: 11/30/2015 Document Reviewed: 07/29/2014 Elsevier Interactive Patient Education  2017 [employer].

## 2019-07-27 NOTE — Progress Notes (Signed)
Subjective:   Christine Roberts is a 74 y.o. female who presents for Medicare Annual (Subsequent) preventive examination.  Virtual Visit via Telephone Note  I connected with Christine Roberts on 07/27/19 at 10:40 AM EST by telephone and verified that I am speaking with the correct person using two identifiers.  Medicare Annual Wellness visit completed telephonically due to Covid-19 pandemic.   Location: Patient: home Provider: office   I discussed the limitations, risks, security and privacy concerns of performing an evaluation and management service by telephone and the availability of in person appointments. The patient expressed understanding and agreed to proceed.  Some vital signs may be absent or patient reported.   Christine Marker, LPN    Review of Systems:   Cardiac Risk Factors include: advanced age (>80mn, >>3women);diabetes mellitus;dyslipidemia;hypertension;obesity (BMI >30kg/m2)     Objective:     Vitals: BP 130/84   Temp 97.8 F (36.6 C)   Ht 5' 4"  (1.626 m)   Wt 198 lb (89.8 kg)   BMI 33.99 kg/m   Body mass index is 33.99 kg/m.  Advanced Directives 07/27/2019 12/31/2018 07/21/2018 05/22/2018 03/14/2018 03/04/2018 07/18/2017  Does Patient Have a Medical Advance Directive? Yes No Yes No No Yes Yes  Type of [place]aramed[place]of [place] - [place] - - - [place] will  Does patient want to make changes to medical advance directive? - - - - - - -  Copy of [place] Chart? No - copy requested - No - copy requested - - - No - copy requested  Would patient like information on creating a medical advance directive? - No - Patient declined - No - Patient declined No - Patient declined - -    Tobacco Social History   Tobacco Use  Smoking Status Never Smoker  Smokeless Tobacco Never Used  Tobacco Comment   smoking cessation materials not required     Counseling  given: Not Answered Comment: smoking cessation materials not required   Clinical Intake:  Pre-visit preparation completed: Yes  Pain : No/denies pain     BMI - recorded: 33.99 Nutritional Status: BMI > 30  Obese Nutritional Risks: None Diabetes: Yes CBG done?: No Did pt. bring in CBG monitor from home?: No   Nutrition Risk Assessment:  Has the patient had any N/V/D within the last 2 months?  No  Does the patient have any non-healing wounds?  No  Has the patient had any unintentional weight loss or weight gain?  No   Diabetes:  Is the patient diabetic?  Yes  If diabetic, was a CBG obtained today?  No  Did the patient bring in their glucometer from home?  No  How often do you monitor your CBG's? Daily fasting in am, today = 82.   Financial Strains and Diabetes Management:  Are you having any financial strains with the device, your supplies or your medication? Yes .  Does the patient want to be seen by Chronic Care Management for management of their diabetes?  Yes  Would the patient like to be referred to a Nutritionist or for Diabetic Management?  No   Diabetic Exams:  Diabetic Eye Exam: Completed 04/01/18 negative retinopathy. Overdue for diabetic eye exam. Pt has been advised about the importance in completing this exam. Pt's previous appt postponed due to Covid.   Diabetic Foot Exam: Completed 07/21/18. Pt has been advised about the importance in completing this  exam.   How often do you need to have someone help you when you read instructions, pamphlets, or other written materials from your doctor or pharmacy?: 1 - Never  Interpreter Needed?: No  Information entered by :: Christine Marker LPN  Past Medical History:  Diagnosis Date  . Diabetes mellitus without complication ([place])   . Hyperlipidemia   . Hypertension   . Kidney stone   . Left shoulder pain   . Obesity   . Osteoarthritis   . Ovarian failure   . Stroke ([place])   . Vitamin B12 deficiency (non anemic)     Past Surgical History:  Procedure Laterality Date  . ABDOMINAL HYSTERECTOMY    . CHOLECYSTECTOMY    . COLONOSCOPY  2015  . COLONOSCOPY WITH PROPOFOL N/A 03/04/2018   Procedure: COLONOSCOPY WITH PROPOFOL;  Surgeon: Toledo, Benay Pike, MD;  Location: [facility] ENDOSCOPY;  Service: Gastroenterology;  Laterality: N/A;  . cystoscopic     stent placed in ureter  . LITHOTRIPSY  12/2007  . MYRINGOTOMY Right 11/2009   Family History  Problem Relation Age of Onset  . Cancer Mother   . Diabetes Mother   . Diabetes Daughter   . CAD Father   . Diabetes Daughter        hypoglycemia   Social History   Socioeconomic History  . Marital status: Widowed    Spouse name: Not on file  . Number of children: 2  . Years of education: Not on file  . Highest education level: Associate degree: occupational, Hotel manager, or vocational program  Occupational History    Employer: [employer]    Comment: retired  Tobacco Use  . Smoking status: Never Smoker  . Smokeless tobacco: Never Used  . Tobacco comment: smoking cessation materials not required  Substance and Sexual Activity  . Alcohol use: Yes    Alcohol/week: 1.0 standard drinks    Types: 1 Shots of liquor per week    Comment: socially  . Drug use: No  . Sexual activity: Not Currently  Other Topics Concern  . Not on file  Social History Narrative  . Not on file   Social Determinants of Health   Financial Resource Strain:   . Difficulty of Paying Living Expenses: Not on file  Food Insecurity: No Food Insecurity  . Worried About Charity fundraiser in the Last Year: Never true  . Ran Out of Food in the Last Year: Never true  Transportation Needs: No Transportation Needs  . Lack of Transportation (Medical): No  . Lack of Transportation (Non-Medical): No  Physical Activity: Sufficiently Active  . Days of Exercise per Week: 7 days  . Minutes of Exercise per Session: 30 min  Stress: No Stress Concern Present  . Feeling of  Stress : Only a little  Social Connections: Somewhat Isolated  . Frequency of Communication with Friends and Family: More than three times a week  . Frequency of Social Gatherings with Friends and Family: Three times a week  . Attends Religious Services: More than 4 times per year  . Active Member of Clubs or Organizations: No  . Attends Archivist Meetings: Never  . Marital Status: Widowed    Outpatient Encounter Medications as of 07/27/2019  Medication Sig  . ACCU-CHEK SOFTCLIX LANCETS lancets 1 each by Other route daily.  Marland Kitchen aspirin EC 81 MG tablet Take 1 tablet (81 mg total) by mouth daily.  Marland Kitchen atorvastatin (LIPITOR) 40 MG tablet Take 1 tablet (40 mg total)  by mouth once a week.  . blood glucose meter kit and supplies KIT Dispense based on patient and insurance preference. Use up to four times daily as directed. (FOR ICD-9 250.00, 250.01).  . calcium citrate (CALCITRATE - DOSED IN MG ELEMENTAL CALCIUM) 950 MG tablet Take 200 mg of elemental calcium by mouth daily. Has D3 in it as well  . fluticasone (FLONASE) 50 MCG/ACT nasal spray Place 2 sprays into both nostrils daily.  Marland Kitchen glucose blood (ACCU-CHEK AVIVA PLUS) test strip TEST BLOOD SUGAR UP TO FOUR TIMES DAILY AS DIRECTED  . Insulin Degludec (TRESIBA FLEXTOUCH) 200 UNIT/ML SOPN Inject 50 Units into the skin daily. (Patient taking differently: Inject 50 Units into the skin daily. Pt taking 34-36 units daily)  . Insulin Pen Needle (NOVOFINE) 30G X 8 MM MISC Inject 10 each into the skin as needed.  Elmore Guise Devices MISC 1 Units by Does not apply route 2 (two) times daily.  Marland Kitchen lisinopril (ZESTRIL) 20 MG tablet TAKE 1 TABLET EVERY DAY  . metFORMIN (GLUCOPHAGE-XR) 750 MG 24 hr tablet Take 1 tablet (750 mg total) by mouth daily with breakfast.  . polyethylene glycol powder (GLYCOLAX/MIRALAX) powder Take 17 g by mouth 2 (two) times daily as needed.  Marland Kitchen VITAMIN B COMPLEX-C PO Take by mouth.  . Alcohol Swabs (B-D SINGLE USE SWABS REGULAR)  PADS 1 each by Does not apply route daily.  . [DISCONTINUED] tamsulosin (FLOMAX) 0.4 MG CAPS capsule Take 1 capsule (0.4 mg total) by mouth daily.   No facility-administered encounter medications on file as of 07/27/2019.    Activities of Daily Living In your present state of health, do you have any difficulty performing the following activities: 07/27/2019 03/23/2019  Hearing? N N  Comment declines hearing aids -  Vision? N N  Difficulty concentrating or making decisions? N N  Walking or climbing stairs? N N  Dressing or bathing? N N  Doing errands, shopping? N N  Preparing Food and eating ? N -  Using the Toilet? N -  In the past six months, have you accidently leaked urine? Y -  Comment wears pads for protection -  Do you have problems with loss of bowel control? N -  Managing your Medications? N -  Managing your Finances? N -  Housekeeping or managing your Housekeeping? N -  Some recent data might be hidden    Patient Care Team: Steele Sizer, MD as PCP - General (Family Medicine) Murrell Redden, MD as Consulting Physician (Urology) Dingeldein, Remo Lipps, MD as Consulting Physician (Ophthalmology) Randa Spike, MD as Consulting Physician (Dermatology) Cathi Roan, [facility] (Pharmacist)    Assessment:   This is a routine wellness examination for [place].  Exercise Activities and Dietary recommendations Current Exercise Habits: Home exercise routine, Type of exercise: walking, Time (Minutes): 30, Frequency (Times/Week): 7, Weekly Exercise (Minutes/Week): 210, Intensity: Mild, Exercise limited by: None identified  Goals    . DIET - INCREASE WATER INTAKE     Recommend to drink at least 6-8 8oz glasses of water per day     . Donut hole (pt-stated)     Current Barriers:  . financial  Pharmacist Clinical Goal(s): Over the next 14 days, [name] will provide the necessary supplementary documents (proof of out of pocket prescription expenditure, proof of household income) needed  for medication assistance applications to CCM pharmacist.   Interventions: . Collaborate with Dr. Ancil Boozer regarding patient's eligibility to switch to Xultophy o Used and liked Victoza in the  past, stopped due to cost . CCM pharmacist will apply for medication assistance program for Tresiba made by [employer] and prescribed by Dr. Ancil Boozer.  Marland Kitchen Updated 10/7: patient portion of application missing some information  . Updated 10/15: submitted patient application missing elements, uploaded to media tab . Updated 10/29: patient is over income for [name] program- uploaded under media tab; provided information regarding an appeal  Patient Self Care Activities:  Marland Kitchen Gather necessary documents needed to apply for medication assistance  Please see past updates related to this goal by clicking on the "Past Updates" button in the selected goal      . Weight (lb) < 200 lb (90.7 kg)     Pt would like to lose 10-20 lbs this year.        Fall Risk Fall Risk  07/27/2019 03/23/2019 11/20/2018 07/21/2018 06/02/2018  Falls in the past year? 0 0 0 0 0  Number falls in past yr: 0 0 0 0 0  Injury with Fall? 0 0 0 - -  Risk for fall due to : No Fall Risks - - - -  Follow up Falls prevention discussed - - - -   FALL RISK PREVENTION PERTAINING TO THE HOME:  Any stairs in or around the home? No  If so, do they handrails? No   Home free of loose throw rugs in walkways, pet beds, electrical cords, etc? Yes  Adequate lighting in your home to reduce risk of falls? Yes   ASSISTIVE DEVICES UTILIZED TO PREVENT FALLS:  Life alert? No  Use of a cane, walker or w/c? No  Grab bars in the bathroom? Yes Shower chair or bench in shower? No  Elevated toilet seat or a handicapped toilet? No   DME ORDERS:  DME order needed?  No   TIMED UP AND GO:  Was the test performed? No . Telephonic visit.   Education: Fall risk prevention has been discussed.  Intervention(s) required? No   Depression Screen PHQ 2/9  Scores 07/27/2019 07/27/2019 03/23/2019 11/20/2018  PHQ - 2 Score 1 0 0 0  PHQ- 9 Score 2 - 0 0     Cognitive Function     6CIT Screen 07/27/2019 07/21/2018 07/18/2017  What Year? 0 points 0 points 0 points  What month? 0 points 0 points 0 points  What time? 0 points 0 points 0 points  Count back from 20 0 points 0 points 0 points  Months in reverse 0 points 0 points 0 points  Repeat phrase 0 points 0 points 0 points  Total Score 0 0 0    Immunization History  Administered Date(s) Administered  . Fluad Quad(high Dose 65+) 03/24/2019  . Influenza Split 04/11/2009  . Influenza, High Dose Seasonal PF 03/21/2015, 05/10/2016, 03/30/2017, 03/30/2018  . Influenza, Seasonal, Injecte, Preservative Fre 04/07/2012  . Influenza,inj,Quad PF,6+ Mos 03/07/2014  . Influenza-Unspecified 03/07/2014, 03/30/2017  . PFIZER SARS-COV-2 Vaccination 07/15/2019  . Pneumococcal Conjugate-13 03/21/2015  . Pneumococcal Polysaccharide-23 10/25/2009  . Tdap 10/25/2009, 03/14/2018  . Zoster 09/24/2011    Qualifies for Shingles Vaccine? Yes  Zostavax completed 2013. Due for Shingrix. Education has been provided regarding the importance of this vaccine. Pt has been advised to call insurance company to determine out of pocket expense. Advised may also receive vaccine at local pharmacy or Health Dept. Verbalized acceptance and understanding.  Tdap: Up to date  Flu Vaccine: Up to date  Pneumococcal Vaccine: Up to date   Screening Tests Health Maintenance  Topic Date Due  .  OPHTHALMOLOGY EXAM  04/02/2019  . FOOT EXAM  07/22/2019  . HEMOGLOBIN A1C  09/21/2019  . MAMMOGRAM  03/17/2021  . COLONOSCOPY  03/05/2023  . TETANUS/TDAP  03/14/2028  . INFLUENZA VACCINE  Completed  . DEXA SCAN  Completed  . Hepatitis C Screening  Completed  . PNA vac Low Risk Adult  Completed    Cancer Screenings:  Colorectal Screening: Completed 03/04/18. Repeat every 5 years;   Mammogram: Completed 03/18/19. Repeat every year;      Bone Density: Completed 2011. Results reflect NORMAL per patient. Repeat every 2 years. Ordered today. Pt provided with contact information and advised to call to schedule appt.   Lung Cancer Screening: (Low Dose CT Chest recommended if Age 29-80 years, 30 pack-year currently smoking OR have quit w/in 15years.) does not qualify.    Additional Screening:  Hepatitis C Screening: does qualify; Completed 03/30/13  Vision Screening: Recommended annual ophthalmology exams for early detection of glaucoma and other disorders of the eye. Is the patient up to date with their annual eye exam?  No  Who is the provider or what is the name of the office in which the pt attends annual eye exams? [place] Screening: Recommended annual dental exams for proper oral hygiene  Community Resource Referral:  CRR required this visit?  No       Plan:     I have personally reviewed and addressed the Medicare Annual Wellness questionnaire and have noted the following in the patient's chart:  A. Medical and social history B. Use of alcohol, tobacco or illicit drugs  C. Current medications and supplements D. Functional ability and status E.  Nutritional status F.  Physical activity G. Advance directives H. List of other physicians I.  Hospitalizations, surgeries, and ER visits in previous 12 months J.  [place] such as hearing and vision if needed, cognitive and depression L. Referrals and appointments   In addition, I have reviewed and discussed with patient certain preventive protocols, quality metrics, and best practice recommendations. A written personalized care plan for preventive services as well as general preventive health recommendations were provided to patient.   Signed,  Christine Marker, LPN Nurse Health Advisor   Nurse Notes: pt state she is now retired and wanted to know if she would be eligible for patient assistance for her tresiba now that she has a  lower income. Message sent to Ruben Reason RPh to follow up for CCM. Pt also advised will be due for 6 month follow up in March.

## 2019-07-28 ENCOUNTER — Ambulatory Visit: Payer: Self-pay | Admitting: Pharmacist

## 2019-07-28 NOTE — Addendum Note (Signed)
Addended by: Clemetine Marker D on: 07/28/2019 10:26 AM   Modules accepted: Orders

## 2019-07-30 ENCOUNTER — Telehealth: Payer: Self-pay | Admitting: Family Medicine

## 2019-07-30 NOTE — Chronic Care Management (AMB) (Signed)
  Chronic Care Management   Note  07/30/2019 Name: Christine Roberts MRN: [id number] DOB: Jun 11, 1946  Christine Roberts is a 74 y.o. year old female who is a primary care patient of Steele Sizer, MD. Christine Roberts is currently enrolled in care management services. An additional referral for Pharm D was placed.   Follow up plan: Telephone appointment with care management team member scheduled for:08/03/2019  Christine Durand, LPN Health Advisor, [place] [facility] ??Christine Roberts ??[phone]

## 2019-08-03 ENCOUNTER — Ambulatory Visit (INDEPENDENT_AMBULATORY_CARE_PROVIDER_SITE_OTHER): Payer: Medicare HMO | Admitting: Pharmacist

## 2019-08-03 DIAGNOSIS — E1121 Type 2 diabetes mellitus with diabetic nephropathy: Secondary | ICD-10-CM

## 2019-08-03 NOTE — Chronic Care Management (AMB) (Signed)
  Chronic Care Management   Note  08/03/2019 Name: Christine Roberts MRN: [id number] DOB: March 17, 1946  74 y.o. year old female referred to Chronic Care Management by Clemetine Marker, LPN for medication assistance.    Was unable to reach patient via telephone today and have left HIPAA compliant voicemail asking patient to return my call. (unsuccessful outreach #1).  Follow up plan: A HIPPA compliant phone message was left for the patient providing contact information and requesting a return call.  The care management team will reach out to the patient again over the next 5-10 days.   Ruben Reason, PharmD Clinical Pharmacist [facility] [phone]

## 2019-08-03 NOTE — Patient Instructions (Signed)
  Thank you allowing the Chronic Care Management Team to be a part of your care!   1. Schedule follow up with Dr. Ancil Boozer  2. Provide Christine Roberts completed Nov[employer] application and [id number] Social Security benefit letter  Goals Addressed            This Visit's Progress   . Donut hole- 2021 (pt-stated)       Current Barriers:  . financial  Pharmacist Clinical Goal(s): Over the next 14 days, Christine Roberts will provide the necessary supplementary documents (proof of out of pocket prescription expenditure, proof of household income) needed for medication assistance applications to CCM pharmacist.   Interventions: . Collaborate with Dr. Ancil Boozer regarding patient's eligibility to switch to Xultophy o Used and liked Victoza in the past, stopped due to cost . CCM pharmacist will apply for medication assistance program for [name] made by [name] and prescribed by Dr. Ancil Boozer.  Marland Kitchen Updated 1/26: re-apply for NovoNordisk program  Patient Self Care Activities:  Marland Kitchen Gather necessary documents needed to apply for medication assistance  Please see past updates related to this goal by clicking on the "Past Updates" button in the selected goal         Please call a member of the CCM (Chronic Care Management) Team with any questions or case management needs:   Neldon Labella, RN, BSN Nurse Care Coordinator  [phone]  Ruben Reason, PharmD  Clinical Pharmacist  [phone]  Elliot Gurney, [place] Clinical Social Worker [phone]

## 2019-08-03 NOTE — Chronic Care Management (AMB) (Signed)
Chronic Care Management   Initial Visit Note  08/03/2019 Name: Christine Roberts MRN: [id number] DOB: Feb 09, 1946  Subjective Christine Roberts is a 74 y.o. year old female who is a primary care patient of Steele Sizer, MD. The CCM team was consulted for assistance with chronic disease management and care coordination needs related to DMII  Review of patient status, including review of consultants reports, relevant laboratory and other test results, and collaboration with appropriate care team members and the patient's provider was performed as part of comprehensive patient evaluation and provision of chronic care management services.    SDOH (Social Determinants of Health) screening performed today: Financial Strain . See Care Plan for related entries.    Objective:   Lab Results  Component Value Date   HGBA1C 8.1 (A) 03/24/2019   HGBA1C 7.7 (H) 11/23/2018   HGBA1C 9.1 (A) 07/21/2018   Lab Results  Component Value Date   MICROALBUR <0.2 11/23/2018   [place] 82 11/23/2018   CREATININE 0.69 11/23/2018     Medications: Outpatient Encounter Medications as of 08/03/2019  Medication Sig  . ACCU-CHEK SOFTCLIX LANCETS lancets 1 each by Other route daily.  Marland Kitchen atorvastatin (LIPITOR) 40 MG tablet Take 1 tablet (40 mg total) by mouth once a week.  Marland Kitchen glucose blood (ACCU-CHEK AVIVA PLUS) test strip TEST BLOOD SUGAR UP TO FOUR TIMES DAILY AS DIRECTED  . Insulin Degludec (TRESIBA FLEXTOUCH) 200 UNIT/ML SOPN Inject 50 Units into the skin daily. (Patient taking differently: Inject 50 Units into the skin daily. Pt taking 34-36 units daily)  . Insulin Pen Needle (NOVOFINE) 30G X 8 MM MISC Inject 10 each into the skin as needed.  Elmore Guise Devices MISC 1 Units by Does not apply route 2 (two) times daily.  . Alcohol Swabs (B-D SINGLE USE SWABS REGULAR) PADS 1 each by Does not apply route daily.  Marland Kitchen aspirin EC 81 MG tablet Take 1 tablet (81 mg total) by mouth daily.  . blood glucose meter kit and supplies  KIT Dispense based on patient and insurance preference. Use up to four times daily as directed. (FOR ICD-9 250.00, 250.01).  . calcium citrate (CALCITRATE - DOSED IN MG ELEMENTAL CALCIUM) 950 MG tablet Take 200 mg of elemental calcium by mouth daily. Has D3 in it as well  . fluticasone (FLONASE) 50 MCG/ACT nasal spray Place 2 sprays into both nostrils daily.  Marland Kitchen lisinopril (ZESTRIL) 20 MG tablet TAKE 1 TABLET EVERY DAY  . metFORMIN (GLUCOPHAGE-XR) 750 MG 24 hr tablet Take 1 tablet (750 mg total) by mouth daily with breakfast.  . polyethylene glycol powder (GLYCOLAX/MIRALAX) powder Take 17 g by mouth 2 (two) times daily as needed.  Marland Kitchen VITAMIN B COMPLEX-C PO Take by mouth.   No facility-administered encounter medications on file as of 08/03/2019.    Assessment:   #Different drug needed: possible switch to Xultophy; need f/u visit with Dr. Ancil Boozer as last A1c was 4 months ago; patient to schedule follow up appointment for March  #Medication assistance: [name]  Goals Addressed            This Visit's Progress   . Donut hole- 2021 (pt-stated)       Current Barriers:  . financial  Pharmacist Clinical Goal(s): Over the next 14 days, [name] will provide the necessary supplementary documents (proof of out of pocket prescription expenditure, proof of household income) needed for medication assistance applications to CCM pharmacist.   Interventions: . Collaborate with Dr. Ancil Boozer regarding patient's eligibility to  switch to [employer] and liked Victoza in the past, stopped due to cost . CCM pharmacist will apply for medication assistance program for [name] made by [name] and prescribed by Dr. Ancil Boozer.  Marland Kitchen Updated 1/26: re-apply for NovoNordisk program  Patient Self Care Activities:  Marland Kitchen Gather necessary documents needed to apply for medication assistance  Please see past updates related to this goal by clicking on the "Past Updates" button in the selected goal          .Plan: Recommendations for provider: none at this time; new A1c due March 2021   Recommendations for patient: Schedule follow up appt with Dr. Ancil Boozer in March; provide completed Tresiba application and [id number] financial records   Follow up Telephone follow up appointment with care management team member scheduled for: within 10 days or sooner pending completed application    Ruben Reason, PharmD Clinical Pharmacist [facility] [phone]

## 2019-08-05 ENCOUNTER — Other Ambulatory Visit: Payer: Self-pay | Admitting: Family Medicine

## 2019-08-05 DIAGNOSIS — E785 Hyperlipidemia, unspecified: Secondary | ICD-10-CM

## 2019-08-05 NOTE — Telephone Encounter (Signed)
Requested Prescriptions  Pending Prescriptions Disp Refills  . atorvastatin (LIPITOR) 40 MG tablet [Pharmacy Med Name: ATORVASTATIN CALCIUM 40 MG Tablet] 12 tablet 0    Sig: TAKE 1 TABLET (40 MG TOTAL) BY MOUTH ONCE A WEEK.     Cardiovascular:  Antilipid - Statins Passed - 08/05/2019  2:12 PM      Passed - Total Cholesterol in normal range and within 360 days    Cholesterol, Total  Date Value Ref Range Status  04/04/2015 190 100 - 199 mg/dL Final   Cholesterol  Date Value Ref Range Status  11/23/2018 158 <200 mg/dL Final         Passed - LDL in normal range and within 360 days    LDL Cholesterol (Calc)  Date Value Ref Range Status  11/23/2018 82 mg/dL (calc) Final    Comment:    Reference range: <100 . Desirable range <100 mg/dL for primary prevention;   <70 mg/dL for patients with CHD or diabetic patients  with > or = 2 CHD risk factors. Marland Kitchen LDL-C is now calculated using the Martin-Hopkins  calculation, which is a validated novel method providing  better accuracy than the Friedewald equation in the  estimation of LDL-C.  Cresenciano Genre et al. Annamaria Helling. [id number]): 2061-2068  (http://education.[facility].com/faq/FAQ164)          Passed - HDL in normal range and within 360 days    HDL  Date Value Ref Range Status  11/23/2018 62 > OR = 50 mg/dL Final  04/04/2015 65 >39 mg/dL Final    Comment:    According to ATP-III Guidelines, HDL-C >59 mg/dL is considered a negative risk factor for CHD.          Passed - Triglycerides in normal range and within 360 days    Triglycerides  Date Value Ref Range Status  11/23/2018 59 <150 mg/dL Final         Passed - Patient is not pregnant      Passed - Valid encounter within last 12 months    Recent Outpatient Visits          4 months ago Benign essential HTN   [facility] Steele Sizer, MD   8 months ago Benign essential HTN   [facility] Steele Sizer, MD   1 year ago Type 2  diabetes with nephropathy [facility])   [facility] Steele Sizer, MD   1 year ago Community acquired pneumonia of left lower lobe of lung [facility])   [facility] Steele Sizer, MD   1 year ago Kidney stone on right side   [facility] Steele Sizer, MD      Future Appointments            In 1 month Ancil Boozer, Drue Stager, MD [facility], [place]   In 7 months Diamantina Providence, Herbert Seta, MD [place]   In 12 months  [facility], [facility]

## 2019-08-10 ENCOUNTER — Ambulatory Visit: Payer: Self-pay | Admitting: Pharmacist

## 2019-08-10 DIAGNOSIS — E1121 Type 2 diabetes mellitus with diabetic nephropathy: Secondary | ICD-10-CM

## 2019-08-12 NOTE — Chronic Care Management (AMB) (Signed)
  Chronic Care Management   Care Coordination Note  08/12/2019 Name: [name] MRN: [id number] DOB: Nov 23, 1945  Care Coordination: received completed [employer] application, submitted to [employer] and uploaded under media tab.   Goals Addressed            This Visit's Progress   . Medication assistance- 2021 (pt-stated)       Current Barriers:  . financial  Pharmacist Clinical Goal(s): Over the next 14 days, [name] will provide the necessary supplementary documents (proof of out of pocket prescription expenditure, proof of household income) needed for medication assistance applications to CCM pharmacist.   Interventions: . Collaborate with Dr. Ancil Boozer regarding patient's eligibility to switch to Xultophy o Used and liked Victoza in the past, stopped due to cost . CCM pharmacist will apply for medication assistance program for [name] made by [name] and prescribed by Dr. Ancil Boozer.  Marland Kitchen Updated 1/26: re-apply for [employer] program . Updated 2/2: received completed application, submitted to [employer] and uploaded under media tab  Patient Self Care Activities:  Marland Kitchen Gather necessary documents needed to apply for medication assistance  Please see past updates related to this goal by clicking on the "Past Updates" button in the selected goal          Follow up plan: Telephone follow up appointment with care management team member scheduled for:1 week with PharmD for assistance status  Ruben Reason, PharmD Clinical Pharmacist [facility] [phone]

## 2019-08-12 NOTE — Patient Instructions (Signed)
Goals Addressed            This Visit's Progress   . Medication assistance- 2021 (pt-stated)       Current Barriers:  . financial  Pharmacist Clinical Goal(s): Over the next 14 days, Christine Roberts will provide the necessary supplementary documents (proof of out of pocket prescription expenditure, proof of household income) needed for medication assistance applications to CCM pharmacist.   Interventions: . Collaborate with Dr. Ancil Boozer regarding patient's eligibility to switch to Xultophy o Used and liked Victoza in the past, stopped due to cost . CCM pharmacist will apply for medication assistance program for Christine Roberts made by [name] and prescribed by Dr. Ancil Boozer.  Marland Kitchen Updated 1/26: re-apply for [employer] program . Updated 2/2: received completed application, submitted to [employer] and uploaded under media tab  Patient Self Care Activities:  Marland Kitchen Gather necessary documents needed to apply for medication assistance  Please see past updates related to this goal by clicking on the "Past Updates" button in the selected goal         The patient verbalized understanding of instructions provided today and declined a print copy of patient instruction materials.

## 2019-08-16 DIAGNOSIS — E1169 Type 2 diabetes mellitus with other specified complication: Secondary | ICD-10-CM | POA: Diagnosis not present

## 2019-08-16 DIAGNOSIS — K642 Third degree hemorrhoids: Secondary | ICD-10-CM | POA: Diagnosis not present

## 2019-08-16 DIAGNOSIS — K625 Hemorrhage of anus and rectum: Secondary | ICD-10-CM | POA: Diagnosis not present

## 2019-08-16 DIAGNOSIS — Z794 Long term (current) use of insulin: Secondary | ICD-10-CM | POA: Diagnosis not present

## 2019-08-16 DIAGNOSIS — E669 Obesity, unspecified: Secondary | ICD-10-CM | POA: Diagnosis not present

## 2019-08-17 ENCOUNTER — Ambulatory Visit: Payer: Self-pay

## 2019-08-17 DIAGNOSIS — E1121 Type 2 diabetes mellitus with diabetic nephropathy: Secondary | ICD-10-CM

## 2019-08-19 DIAGNOSIS — K648 Other hemorrhoids: Secondary | ICD-10-CM | POA: Diagnosis not present

## 2019-08-24 ENCOUNTER — Ambulatory Visit: Payer: Self-pay | Admitting: Pharmacist

## 2019-08-24 NOTE — Chronic Care Management (AMB) (Signed)
  Chronic Care Management   Follow Up Note   08/24/2019- late entry Name: Christine Roberts MRN: [id number] DOB: 01/30/1946  Subjective Christine Roberts is a 74 y.o. year old female who is a primary care patient of Christine Sizer, MD. The CCM clinical pharmacist following up today with patient assistance application for [place].     Assessment Goals Addressed            This Visit's Progress   . Medication assistance- 2021 (pt-stated)       Current Barriers:  . financial  Pharmacist Clinical Goal(s): Over the next 14 days, Christine Roberts will provide the necessary supplementary documents (proof of out of pocket prescription expenditure, proof of household income) needed for medication assistance applications to CCM pharmacist.   Interventions: . Collaborate with Christine Roberts regarding patient's eligibility to switch to Xultophy o Used and liked Victoza in the past, stopped due to cost . CCM pharmacist will apply for medication assistance program for Christine Roberts made by Christine Roberts and prescribed by Christine Roberts.  Marland Kitchen Updated: outreach to [employer], patient needs to submit a letter of financial hardship  Patient Self Care Activities:  Marland Kitchen Gather necessary documents needed to apply for medication assistance  Please see past updates related to this goal by clicking on the "Past Updates" button in the selected goal          Plan:   Telephone follow up appointment with care management team member scheduled for: 1 week with PharmD   Christine Roberts, PharmD Clinical Pharmacist [facility] [phone]

## 2019-08-24 NOTE — Chronic Care Management (AMB) (Signed)
  Chronic Care Management   Note  08/24/2019 Name: Christine Roberts MRN: [id number] DOB: [birth year]  74 y.o. year old female referred to Chronic Care Management by Dr. Steele Sizer for medication assistance. Outreach today to follow up on [employer] assistance application.    Was unable to reach patient via telephone today and have left HIPAA compliant voicemail asking patient to return my call. (unsuccessful outreach #1).  Follow up plan: A HIPPA compliant phone message was left for the patient providing contact information and requesting a return call.  The care management team will reach out to the patient again over the next 5 days.   Ruben Reason, PharmD Clinical Pharmacist [facility] [phone]

## 2019-08-27 ENCOUNTER — Ambulatory Visit (INDEPENDENT_AMBULATORY_CARE_PROVIDER_SITE_OTHER): Payer: Medicare HMO | Admitting: Pharmacist

## 2019-08-27 DIAGNOSIS — E1121 Type 2 diabetes mellitus with diabetic nephropathy: Secondary | ICD-10-CM | POA: Diagnosis not present

## 2019-08-27 NOTE — Chronic Care Management (AMB) (Signed)
  Chronic Care Management   Follow Up Note   08/27/2019 Name: Christine Roberts MRN: [id number] DOB: 1946/05/01  Subjective Christine Roberts is a 74 y.o. year old female who is a primary care patient of Steele Sizer, MD. The CCM clinical pharmacist telephone outreach today to follow up with patient on [employer] assistance application. HIPAA identifiers verified.     Objective Outpatient Encounter Medications as of 08/27/2019  Medication Sig  . ACCU-CHEK SOFTCLIX LANCETS lancets 1 each by Other route daily.  . Alcohol Swabs (B-D SINGLE USE SWABS REGULAR) PADS 1 each by Does not apply route daily.  Marland Kitchen aspirin EC 81 MG tablet Take 1 tablet (81 mg total) by mouth daily.  Marland Kitchen atorvastatin (LIPITOR) 40 MG tablet TAKE 1 TABLET (40 MG TOTAL) BY MOUTH ONCE A WEEK.  . blood glucose meter kit and supplies KIT Dispense based on patient and insurance preference. Use up to four times daily as directed. (FOR ICD-9 250.00, 250.01).  . calcium citrate (CALCITRATE - DOSED IN MG ELEMENTAL CALCIUM) 950 MG tablet Take 200 mg of elemental calcium by mouth daily. Has D3 in it as well  . fluticasone (FLONASE) 50 MCG/ACT nasal spray Place 2 sprays into both nostrils daily.  Marland Kitchen glucose blood (ACCU-CHEK AVIVA PLUS) test strip TEST BLOOD SUGAR UP TO FOUR TIMES DAILY AS DIRECTED  . Insulin Degludec (TRESIBA FLEXTOUCH) 200 UNIT/ML SOPN Inject 50 Units into the skin daily. (Patient taking differently: Inject 50 Units into the skin daily. Pt taking 34-36 units daily)  . Insulin Pen Needle (NOVOFINE) 30G X 8 MM MISC Inject 10 each into the skin as needed.  Elmore Guise Devices MISC 1 Units by Does not apply route 2 (two) times daily.  Marland Kitchen lisinopril (ZESTRIL) 20 MG tablet TAKE 1 TABLET EVERY DAY  . metFORMIN (GLUCOPHAGE-XR) 750 MG 24 hr tablet Take 1 tablet (750 mg total) by mouth daily with breakfast.  . polyethylene glycol powder (GLYCOLAX/MIRALAX) powder Take 17 g by mouth 2 (two) times daily as needed.  Marland Kitchen VITAMIN B COMPLEX-C PO Take by  mouth.   No facility-administered encounter medications on file as of 08/27/2019.     Assessment:   Goals Addressed            This Visit's Progress   . Medication assistance- 2021 (pt-stated)       Current Barriers:  . financial  Pharmacist Clinical Goal(s): Over the next 14 days, [name] will provide the necessary supplementary documents (proof of out of pocket prescription expenditure, proof of household income) needed for medication assistance applications to CCM pharmacist.   Interventions: . Collaborate with Dr. Ancil Boozer regarding patient's eligibility to switch to Xultophy o Used and liked Victoza in the past, stopped due to cost . CCM pharmacist will apply for medication assistance program for [name] made by [name] and prescribed by Dr. Ancil Boozer.  Marland Kitchen Updated: reviewed how to write letter of financial hardship  Patient Self Care Activities:  Marland Kitchen Gather necessary documents needed to apply for medication assistance  Please see past updates related to this goal by clicking on the "Past Updates" button in the selected goal          Plan:   Face to Face appointment with care management team member scheduled for: Tuesday Feb 23  Ruben Reason, PharmD Clinical Pharmacist [facility] [phone]

## 2019-08-31 ENCOUNTER — Ambulatory Visit: Payer: Self-pay | Admitting: Pharmacist

## 2019-08-31 DIAGNOSIS — E1121 Type 2 diabetes mellitus with diabetic nephropathy: Secondary | ICD-10-CM | POA: Diagnosis not present

## 2019-08-31 NOTE — Chronic Care Management (AMB) (Signed)
  Chronic Care Management   Follow Up Note   08/31/2019 Name: ESTELL PUCCINI MRN: [id number] DOB: Feb 25, 1946  Subjective KAMEREN BAADE is a 74 y.o. year old female who is a primary care patient of Steele Sizer, MD. The CCM clinical pharmacist met with patient today to complete a letter of financial hardship for [place] assistance application. HIPAA identifiers verified.  Objective Outpatient Encounter Medications as of 08/31/2019  Medication Sig  . ACCU-CHEK SOFTCLIX LANCETS lancets 1 each by Other route daily.  . Alcohol Swabs (B-D SINGLE USE SWABS REGULAR) PADS 1 each by Does not apply route daily.  Marland Kitchen aspirin EC 81 MG tablet Take 1 tablet (81 mg total) by mouth daily.  Marland Kitchen atorvastatin (LIPITOR) 40 MG tablet TAKE 1 TABLET (40 MG TOTAL) BY MOUTH ONCE A WEEK.  . blood glucose meter kit and supplies KIT Dispense based on patient and insurance preference. Use up to four times daily as directed. (FOR ICD-9 250.00, 250.01).  . calcium citrate (CALCITRATE - DOSED IN MG ELEMENTAL CALCIUM) 950 MG tablet Take 200 mg of elemental calcium by mouth daily. Has D3 in it as well  . fluticasone (FLONASE) 50 MCG/ACT nasal spray Place 2 sprays into both nostrils daily.  Marland Kitchen glucose blood (ACCU-CHEK AVIVA PLUS) test strip TEST BLOOD SUGAR UP TO FOUR TIMES DAILY AS DIRECTED  . Insulin Degludec (TRESIBA FLEXTOUCH) 200 UNIT/ML SOPN Inject 50 Units into the skin daily. (Patient taking differently: Inject 50 Units into the skin daily. Pt taking 34-36 units daily)  . Insulin Pen Needle (NOVOFINE) 30G X 8 MM MISC Inject 10 each into the skin as needed.  Elmore Guise Devices MISC 1 Units by Does not apply route 2 (two) times daily.  Marland Kitchen lisinopril (ZESTRIL) 20 MG tablet TAKE 1 TABLET EVERY DAY  . metFORMIN (GLUCOPHAGE-XR) 750 MG 24 hr tablet Take 1 tablet (750 mg total) by mouth daily with breakfast.  . polyethylene glycol powder (GLYCOLAX/MIRALAX) powder Take 17 g by mouth 2 (two) times daily as needed.  Marland Kitchen VITAMIN B COMPLEX-C  PO Take by mouth.   No facility-administered encounter medications on file as of 08/31/2019.     Assessment:  Goals Addressed            This Visit's Progress   . Medication assistance- 2021 (pt-stated)       Current Barriers:  . financial  Pharmacist Clinical Goal(s): Over the next 14 days, [name] will provide the necessary supplementary documents (proof of out of pocket prescription expenditure, proof of household income) needed for medication assistance applications to CCM pharmacist.   Interventions: . Collaborate with Dr. Ancil Boozer regarding patient's eligibility to switch to Xultophy o Used and liked Victoza in the past, stopped due to cost . CCM pharmacist will apply for medication assistance program for [name] made by [name] and prescribed by Dr. Ancil Boozer.  Marland Kitchen Updated 2/23: patient provided and pharmacist submitted letter of financial hardship  Patient Self Care Activities:  Marland Kitchen Gather necessary documents needed to apply for medication assistance  Please see past updates related to this goal by clicking on the "Past Updates" button in the selected goal          Plan:   Telephone follow up appointment with care management team member scheduled for: 1 week with [employer] to check status of application  Ruben Reason, PharmD Clinical Pharmacist [facility] [phone]

## 2019-08-31 NOTE — Patient Instructions (Signed)
Goals Addressed            This Visit's Progress   . Medication assistance- 2021 (pt-stated)       Current Barriers:  . financial  Pharmacist Clinical Goal(s): Over the next 14 days, [name] will provide the necessary supplementary documents (proof of out of pocket prescription expenditure, proof of household income) needed for medication assistance applications to CCM pharmacist.   Interventions: . Collaborate with Dr. Ancil Boozer regarding patient's eligibility to switch to Xultophy o Used and liked Victoza in the past, stopped due to cost . CCM pharmacist will apply for medication assistance program for [name] made by [name] and prescribed by Dr. Ancil Boozer.  Marland Kitchen Updated 2/23: patient provided and pharmacist submitted letter of financial hardship  Patient Self Care Activities:  Marland Kitchen Gather necessary documents needed to apply for medication assistance  Please see past updates related to this goal by clicking on the "Past Updates" button in the selected goal         The patient verbalized understanding of instructions provided today and declined a print copy of patient instruction materials.

## 2019-09-14 ENCOUNTER — Other Ambulatory Visit: Payer: Self-pay | Admitting: Family Medicine

## 2019-09-14 DIAGNOSIS — Z1231 Encounter for screening mammogram for malignant neoplasm of breast: Secondary | ICD-10-CM

## 2019-10-01 ENCOUNTER — Encounter: Payer: Self-pay | Admitting: Family Medicine

## 2019-10-01 ENCOUNTER — Ambulatory Visit: Payer: Self-pay | Admitting: *Deleted

## 2019-10-01 ENCOUNTER — Ambulatory Visit (INDEPENDENT_AMBULATORY_CARE_PROVIDER_SITE_OTHER): Payer: Medicare HMO | Admitting: Family Medicine

## 2019-10-01 ENCOUNTER — Other Ambulatory Visit: Payer: Self-pay

## 2019-10-01 VITALS — BP 130/84 | HR 103 | Temp 96.9°F | Resp 16 | Ht 63.5 in | Wt 211.4 lb

## 2019-10-01 DIAGNOSIS — I10 Essential (primary) hypertension: Secondary | ICD-10-CM | POA: Diagnosis not present

## 2019-10-01 DIAGNOSIS — N200 Calculus of kidney: Secondary | ICD-10-CM | POA: Diagnosis not present

## 2019-10-01 DIAGNOSIS — E538 Deficiency of other specified B group vitamins: Secondary | ICD-10-CM

## 2019-10-01 DIAGNOSIS — Z8673 Personal history of transient ischemic attack (TIA), and cerebral infarction without residual deficits: Secondary | ICD-10-CM

## 2019-10-01 DIAGNOSIS — D692 Other nonthrombocytopenic purpura: Secondary | ICD-10-CM

## 2019-10-01 DIAGNOSIS — E785 Hyperlipidemia, unspecified: Secondary | ICD-10-CM | POA: Diagnosis not present

## 2019-10-01 DIAGNOSIS — M170 Bilateral primary osteoarthritis of knee: Secondary | ICD-10-CM

## 2019-10-01 DIAGNOSIS — I700 Atherosclerosis of aorta: Secondary | ICD-10-CM | POA: Diagnosis not present

## 2019-10-01 DIAGNOSIS — E1121 Type 2 diabetes mellitus with diabetic nephropathy: Secondary | ICD-10-CM | POA: Diagnosis not present

## 2019-10-01 LAB — POCT GLYCOSYLATED HEMOGLOBIN (HGB A1C): Hemoglobin A1C: 8.1 % — AB (ref 4.0–5.6)

## 2019-10-01 MED ORDER — OZEMPIC (0.25 OR 0.5 MG/DOSE) 2 MG/1.5ML ~~LOC~~ SOPN: 0.5000 mg | PEN_INJECTOR | SUBCUTANEOUS | 1 refills | Status: DC

## 2019-10-01 NOTE — Progress Notes (Signed)
Name: Christine Roberts   MRN: [id number]    DOB: 03-Jan-1946   Date:10/01/2019       Progress Note  Subjective  Chief Complaint  Chief Complaint  Patient presents with  . Hypertension  . Diabetes  . Hemorrhoids    She has been bothered with her hemorrhoids, she has been evaluated by specialist.  . Thumb    She has a clicking noise in her right thumb that she has since she has retired.    HPI  DMII: she is on Metformin, Tresibanow at 36 units. She is offVictoza since the Summer because she reached the doughnut hole, she is talking to Almyra Free Hedricks to see if she can qualify for assistance programShe is on ace for microalbuminuria and denies side effects of medications. She was doing well on Victoza, helps with her weight and cubs her appetite. She retired now and is getting [place] from [employer] and we will try to do the same for [place] and see if she can get it, since A1C is not controlled. Today at 8.1%   HTN : compliant with medication, no side effects, no chest pain, dizzinessor palpitation.She states sometimes bp goes up and she takes an extra pill when needed   Morbid Obesity:weight is gradually going up again, BMI above 35 with co-,morbidities : DM, HTN, OA knees   Hyperlipidemia: taking lipitor one time a week to avoid side effects, otherwise she has leg pains. Reviewed last labs with patient again   TIA versus Complicated migraine: she developed dizziness, headache and aphasia on 11/16 - she was transported by EMS to [facility], she had normal Echo, CT, MRI of brain and carotid dopplers. She was seen by neurologist and advised to continue on aspirin and lipitor in case it was a TIA, but likely a complicated migraine. She has been asymptomatic since Unchanged   B12 deficiency:still not back on supplementation, unchanged   Nephrolithiasis: small on on right kidney and urologist does not think it is the cause of her pain, she has a follow up in September   Senile purpura:   skin very thin, currently on her hands  Atherosclerosis of aorta: continue statin therapy and aspirin 81 mg daily, only taking atorvastatin once a week and is doing well. Unchanging   Grade III hemorrhoids; seen by Dr. Alice Reichert - GI and was referred to general surgeon for possible banding, but she tried suppositories, also advised to increase fiber in her diet and if no improvement of rectal bleeding she will go back for the banding. She had CBC done 08/2019 and no anemia  Patient Active Problem List   Diagnosis Date Noted  . Morbid obesity ([place]) 07/21/2018  . Atherosclerosis of aorta ([place]) 05/25/2018  . Seasonal allergic rhinitis 09/12/2015  . TIA (transient ischemic attack) 05/24/2015  . Senile purpura ([place]) 05/09/2015  . Benign essential HTN 03/18/2015  . Pain in shoulder 03/18/2015  . Dyslipidemia 03/18/2015  . History of surgery to major organs, presenting hazards to health 03/18/2015  . Hearing loss 03/18/2015  . Hiatal hernia 03/18/2015  . History of colon polyps 03/18/2015  . Calculus of kidney 03/18/2015  . B12 deficiency 03/18/2015  . Microalbuminuria 03/18/2015  . Adult BMI 30+ 03/18/2015  . Arthritis, degenerative 03/18/2015  . Diabetes mellitus with nephropathy ([place]) 03/18/2015    Past Surgical History:  Procedure Laterality Date  . ABDOMINAL HYSTERECTOMY    . CHOLECYSTECTOMY    . COLONOSCOPY  2015  . COLONOSCOPY WITH PROPOFOL N/A 03/04/2018  Procedure: COLONOSCOPY WITH PROPOFOL;  Surgeon: Toledo, Benay Pike, MD;  Location: [facility] ENDOSCOPY;  Service: Gastroenterology;  Laterality: N/A;  . cystoscopic     stent placed in ureter  . LITHOTRIPSY  12/2007  . MYRINGOTOMY Right 11/2009    Family History  Problem Relation Age of Onset  . Cancer Mother   . Diabetes Mother   . Diabetes Daughter   . CAD Father   . Diabetes Daughter        hypoglycemia    Social History   Tobacco Use  . Smoking status: Never Smoker  . Smokeless tobacco: Never Used  . Tobacco  comment: smoking cessation materials not required  Substance Use Topics  . Alcohol use: Yes    Alcohol/week: 1.0 standard drinks    Types: 1 Shots of liquor per week    Comment: socially     Current Outpatient Medications:  .  ACCU-CHEK SOFTCLIX LANCETS lancets, 1 each by Other route daily., Disp: 100 each, Rfl: 1 .  aspirin EC 81 MG tablet, Take 1 tablet (81 mg total) by mouth daily., Disp: 90 tablet, Rfl: 0 .  atorvastatin (LIPITOR) 40 MG tablet, TAKE 1 TABLET (40 MG TOTAL) BY MOUTH ONCE A WEEK., Disp: 12 tablet, Rfl: 0 .  blood glucose meter kit and supplies KIT, Dispense based on patient and insurance preference. Use up to four times daily as directed. (FOR ICD-9 250.00, 250.01)., Disp: 1 each, Rfl: 0 .  calcium citrate (CALCITRATE - DOSED IN MG ELEMENTAL CALCIUM) 950 MG tablet, Take 200 mg of elemental calcium by mouth daily. Has D3 in it as well, Disp: , Rfl:  .  fluticasone (FLONASE) 50 MCG/ACT nasal spray, Place 2 sprays into both nostrils daily., Disp: 16 g, Rfl: 6 .  glucose blood (ACCU-CHEK AVIVA PLUS) test strip, TEST BLOOD SUGAR UP TO FOUR TIMES DAILY AS DIRECTED, Disp: 400 each, Rfl: 0 .  hydrocortisone (ANUSOL-HC) 25 MG suppository, Place rectally., Disp: , Rfl:  .  Insulin Degludec (TRESIBA FLEXTOUCH) 200 UNIT/ML SOPN, Inject 50 Units into the skin daily. (Patient taking differently: Inject 50 Units into the skin daily. Pt taking 34-36 units daily), Disp: 27 mL, Rfl: 0 .  Insulin Pen Needle (NOVOFINE) 30G X 8 MM MISC, Inject 10 each into the skin as needed., Disp: 100 each, Rfl: 2 .  Lancet Devices MISC, 1 Units by Does not apply route 2 (two) times daily., Disp: 100 each, Rfl: 3 .  lisinopril (ZESTRIL) 20 MG tablet, TAKE 1 TABLET EVERY DAY, Disp: 90 tablet, Rfl: 0 .  metFORMIN (GLUCOPHAGE-XR) 750 MG 24 hr tablet, Take 1 tablet (750 mg total) by mouth daily with breakfast., Disp: 180 tablet, Rfl: 1 .  polyethylene glycol powder (GLYCOLAX/MIRALAX) powder, Take 17 g by mouth 2  (two) times daily as needed., Disp: 3350 g, Rfl: 1 .  VITAMIN B COMPLEX-C PO, Take by mouth., Disp: , Rfl:  .  Alcohol Swabs (B-D SINGLE USE SWABS REGULAR) PADS, 1 each by Does not apply route daily., Disp: 400 each, Rfl: 1  Allergies  Allergen Reactions  . Latex Rash  . Erythromycin Other (See Comments)    Strawberry tongue    I personally reviewed active problem list, medication list, allergies, family history, social history, health maintenance with the patient/caregiver today.   ROS  Constitutional: Negative for fever , positive for  weight change.  Respiratory: Negative for cough and shortness of breath.   Cardiovascular: Negative for chest pain or palpitations.  Gastrointestinal: Negative for abdominal  pain, no bowel changes.  Musculoskeletal: Negative for gait problem or joint swelling.  Skin: Negative for rash.  Neurological: Negative for dizziness or headache.  No other specific complaints in a complete review of systems (except as listed in HPI above).  Objective  Vitals:   10/01/19 1019  BP: 130/84  Pulse: (!) 103  Resp: 16  Temp: (!) 96.9 F (36.1 C)  TempSrc: Temporal  SpO2: 95%  Weight: 211 lb 6.4 oz (95.9 kg)  Height: 5' 3.5" (1.613 m)    Body mass index is 36.86 kg/m.  Physical Exam  Constitutional: Patient appears well-developed and well-nourished. Obese  No distress.  HEENT: head atraumatic, normocephalic, pupils equal and reactive to light Cardiovascular: Normal rate, regular rhythm and normal heart sounds.  No murmur heard. No BLE edema. Pulmonary/Chest: Effort normal and breath sounds normal. No respiratory distress. Abdominal: Soft.  There is no tenderness. Skin: purpura on both hands  Psychiatric: Patient has a normal mood and affect. behavior is normal. Judgment and thought content normal.   Recent Results (from the past 2160 hour(s))  POCT HgB A1C     Status: Abnormal   Collection Time: 10/01/19 10:39 AM  Result Value Ref Range    Hemoglobin A1C 8.1 (A) 4.0 - 5.6 %   HbA1c POC (<> result, manual entry)     HbA1c, POC (prediabetic range)     HbA1c, POC (controlled diabetic range)      Diabetic Foot Exam: Diabetic Foot Exam - Simple   Simple Foot Form Diabetic Foot exam was performed with the following findings: Yes 10/01/2019 10:57 AM  Visual Inspection No deformities, no ulcerations, no other skin breakdown bilaterally: Yes Sensation Testing Intact to touch and monofilament testing bilaterally: Yes Pulse Check Posterior Tibialis and Dorsalis pulse intact bilaterally: Yes Comments      PHQ2/9: Depression screen [facility] 2/9 10/01/2019 07/27/2019 07/27/2019 03/23/2019 11/20/2018  Decreased Interest 0 0 0 0 0  Down, Depressed, Hopeless 0 1 0 0 0  PHQ - 2 Score 0 1 0 0 0  Altered sleeping 0 0 - 0 0  Tired, decreased energy 0 1 - 0 0  Change in appetite 0 0 - 0 0  Feeling bad or failure about yourself  0 0 - 0 0  Trouble concentrating 0 0 - 0 0  Moving slowly or fidgety/restless 0 0 - 0 0  Suicidal thoughts 0 0 - 0 0  PHQ-9 Score 0 2 - 0 0  Difficult doing work/chores - Not difficult at all - - -    phq 9 is negative   Fall Risk: Fall Risk  10/01/2019 07/27/2019 03/23/2019 11/20/2018 07/21/2018  Falls in the past year? 0 0 0 0 0  Number falls in past yr: 0 0 0 0 0  Injury with Fall? 0 0 0 0 -  Risk for fall due to : - No Fall Risks - - -  Follow up - Falls prevention discussed - - -     Functional Status Survey: Is the patient deaf or have difficulty hearing?: No Does the patient have difficulty seeing, even when wearing glasses/contacts?: No Does the patient have difficulty concentrating, remembering, or making decisions?: No Does the patient have difficulty walking or climbing stairs?: No Does the patient have difficulty dressing or bathing?: No Does the patient have difficulty doing errands alone such as visiting a doctor's office or shopping?: No   Assessment & Plan  1. Type 2 diabetes with  nephropathy (HCC)  - POCT  HgB A1C - Semaglutide,0.25 or 0.5MG/DOS, (OZEMPIC, 0.25 OR 0.5 MG/DOSE,) 2 MG/1.5ML SOPN; Inject 0.5 mg into the skin once a week.  Dispense: 6 pen; Refill: 1  2. Atherosclerosis of aorta ([place])  On statin once a week and aspirin   3. Senile purpura (HCC)  stable  4. Dyslipidemia   5. Morbid obesity ([place])  Discussed with the patient the risk posed by an increased BMI. Discussed importance of portion control, calorie counting and at least 150 minutes of physical activity weekly. Avoid sweet beverages and drink more water. Eat at least 6 servings of fruit and vegetables daily   6. Benign essential HTN  At goal   7. B12 deficiency  Continue supplementation   8. Kidney stone on right side  Not having pain now   9. Hx-TIA (transient ischemic attack)   10. Primary osteoarthritis of both knees

## 2019-10-01 NOTE — Addendum Note (Signed)
Addended by: Chilton Greathouse on: 10/01/2019 03:19 PM   Modules accepted: Orders

## 2019-10-01 NOTE — Chronic Care Management (AMB) (Signed)
  Chronic Care Management   Note  10/01/2019 Name: Christine Roberts MRN: [id number] DOB: [birth year]  Request for pharmacy referral for Ozempic medication assistance forwarded to [place].   Follow up plan: [facility].   [facility] [place] Management  [phone]

## 2019-10-06 ENCOUNTER — Other Ambulatory Visit: Payer: Self-pay | Admitting: Family Medicine

## 2019-10-06 DIAGNOSIS — E1121 Type 2 diabetes mellitus with diabetic nephropathy: Secondary | ICD-10-CM

## 2019-10-06 MED ORDER — OZEMPIC (0.25 OR 0.5 MG/DOSE) 2 MG/1.5ML ~~LOC~~ SOPN: 0.5000 mg | PEN_INJECTOR | SUBCUTANEOUS | 1 refills | Status: DC

## 2019-10-27 ENCOUNTER — Other Ambulatory Visit: Payer: Self-pay | Admitting: Family Medicine

## 2019-10-27 DIAGNOSIS — E785 Hyperlipidemia, unspecified: Secondary | ICD-10-CM

## 2019-11-01 ENCOUNTER — Telehealth: Payer: Self-pay | Admitting: Family Medicine

## 2019-11-01 NOTE — Chronic Care Management (AMB) (Signed)
  Care Management   Note  11/01/2019 Name: Christine Roberts MRN: [id number] DOB: 12/10/1945  Christine Roberts is a 74 y.o. year old female who is a primary care patient of Steele Sizer, MD and is actively engaged with the care management team. I reached out to [name] by phone today to assist with scheduling an initial visit with the Pharmacist  Follow up plan: Telephone appointment with care management team member scheduled for:11/03/2019  Noreene Larsson, [place], [place], [place] [address] Direct Dial: [phone] [email or web address] Website: [email or web address]

## 2019-11-03 ENCOUNTER — Other Ambulatory Visit: Payer: Self-pay | Admitting: Emergency Medicine

## 2019-11-03 ENCOUNTER — Other Ambulatory Visit: Payer: Self-pay

## 2019-11-03 ENCOUNTER — Ambulatory Visit: Payer: Medicare HMO | Admitting: Pharmacist

## 2019-11-03 DIAGNOSIS — E1121 Type 2 diabetes mellitus with diabetic nephropathy: Secondary | ICD-10-CM

## 2019-11-03 DIAGNOSIS — Z79899 Other long term (current) drug therapy: Secondary | ICD-10-CM

## 2019-11-03 DIAGNOSIS — E785 Hyperlipidemia, unspecified: Secondary | ICD-10-CM

## 2019-11-03 NOTE — Chronic Care Management (AMB) (Signed)
Chronic Care Management Pharmacy  Name: Christine Roberts  MRN: [id number] DOB: 02-05-1946   Chief Complaint/ HPI  Christine Roberts,  74 y.o. , female presents for their Initial CCM visit with the clinical pharmacist via telephone due to COVID-19 Pandemic.  PCP : Steele Sizer, MD  Their chronic conditions include: HTN, DM, dyslipidemia  Office Visits: 3/26 DM Sowles BP 130/84 P 103 Wt 211 A1c 8.1% start Ozempic  Consult Visit:  2/23 Kary Kos [facility] Applied for [name]  Medications: Outpatient Encounter Medications as of 11/03/2019  Medication Sig  . ACCU-CHEK SOFTCLIX LANCETS lancets 1 each by Other route daily.  Marland Kitchen aspirin EC 81 MG tablet Take 1 tablet (81 mg total) by mouth daily.  Marland Kitchen atorvastatin (LIPITOR) 40 MG tablet TAKE 1 TABLET ONE TIME WEEKLY  . blood glucose meter kit and supplies KIT Dispense based on patient and insurance preference. Use up to four times daily as directed. (FOR ICD-9 250.00, 250.01).  . calcium citrate (CALCITRATE - DOSED IN MG ELEMENTAL CALCIUM) 950 MG tablet Take 200 mg of elemental calcium by mouth daily. Has D3 in it as well  . glucose blood (ACCU-CHEK AVIVA PLUS) test strip TEST BLOOD SUGAR UP TO FOUR TIMES DAILY AS DIRECTED  . hydrocortisone (ANUSOL-HC) 25 MG suppository Place rectally.  . Insulin Degludec (TRESIBA FLEXTOUCH) 200 UNIT/ML SOPN Inject 50 Units into the skin daily. (Patient taking differently: Inject 50 Units into the skin daily. Pt taking 34-36 units daily)  . Insulin Pen Needle (NOVOFINE) 30G X 8 MM MISC Inject 10 each into the skin as needed.  Elmore Guise Devices MISC 1 Units by Does not apply route 2 (two) times daily.  Marland Kitchen lisinopril (ZESTRIL) 20 MG tablet TAKE 1 TABLET EVERY DAY  . metFORMIN (GLUCOPHAGE-XR) 750 MG 24 hr tablet Take 1 tablet (750 mg total) by mouth daily with breakfast.  . Semaglutide,0.25 or 0.5MG/DOS, (OZEMPIC, 0.25 OR 0.5 MG/DOSE,) 2 MG/1.5ML SOPN Inject 0.5 mg into the skin once a week.  Marland Kitchen VITAMIN B COMPLEX-C PO Take by  mouth.  . Alcohol Swabs (B-D SINGLE USE SWABS REGULAR) PADS 1 each by Does not apply route daily.  . fluticasone (FLONASE) 50 MCG/ACT nasal spray Place 2 sprays into both nostrils daily. (Patient not taking: Reported on 11/03/2019)  . polyethylene glycol powder (GLYCOLAX/MIRALAX) powder Take 17 g by mouth 2 (two) times daily as needed. (Patient not taking: Reported on 11/03/2019)   No facility-administered encounter medications on file as of 11/03/2019.     Current Diagnosis/Assessment:  Goals Addressed            This Visit's Progress   . Hyperlipidemia - Goal LDL < 70 (pt-stated)       CARE PLAN ENTRY (see longitudinal plan of care for additional care plan information)  Current Barriers:  . Uncontrolled hyperlipidemia, complicated by statin intolerance . Current antihyperlipidemic regimen: Lipitor 50m weekly . Previous antihyperlipidemic medications tried Crestor . Most recent lipid panel:     Component Value Date/Time   CHOL 158 11/23/2018 0805   CHOL 190 04/04/2015 0810   TRIG 59 11/23/2018 0805   HDL 62 11/23/2018 0805   HDL 65 04/04/2015 0810   CHOLHDL 2.5 11/23/2018 0805   VLDL 23 05/24/2015 1020   LDLCALC 82 11/23/2018 0805 .   .Marland KitchenASCVD risk enhancing conditions: age >>72 DM, HTN, CKD, CHF, current smoker  Pharmacist Clinical Goal(s):  .Marland KitchenOver the next 90 days, patient will work with PharmD and providers towards optimized antihyperlipidemic therapy  Interventions: . Comprehensive medication review performed; medication list updated in electronic medical record.  Bertram Savin care team collaboration (see longitudinal plan of care) . Recommended Co-Q10 repletion for 14 days  Patient Self Care Activities:  . Patient will focus on medication adherence by taking 1 Co-Q10 tablet daily . With guidance from pharmacy slowly increase Lipitor to daily over a period of 90 days  Initial goal documentation     . Medication Management (pt-stated)       CARE PLAN  ENTRY (see longitudinal plan of care for additional care plan information)  Current Barriers:  . Financial Barriers: patient has [facility] insurance and reports copay for Tresiba & Ozepmic is cost prohibitive at this time  Pharmacist Clinical Goal(s):  Marland Kitchen Maintain medication assistance progress established by former CCM pharmacist . Renew annually as needed  Interventions: . Comprehensive medication review completed; medication list updated in electronic medical record.  Bertram Savin care team collaboration (see longitudinal plan of care)  Patient Self Care Activities:  . Patient will provide necessary portions of application  . Patient to contact CCM pharmacy annually and as needed to maintain flow of free medications from the manufacturer  Initial goal documentation       Hyperlipidemia    The 10-year ASCVD risk score Mikey Bussing DC Brooke Bonito., et al., 2013) is: 37.1%   Values used to calculate the score:     Age: 72 years     Sex: Female     Is Non-Hispanic African American: No     Diabetic: Yes     Tobacco smoker: No     Systolic Blood Pressure: 433 mmHg     Is BP treated: Yes     HDL Cholesterol: 62 mg/dL     Total Cholesterol: 158 mg/dL   Patient has failed these meds in past: Crestor Patient is currently uncontrolled on the following medications: Lipitor 33m qwk  We discussed:  History of myalgias with daily statin. There is no evidence of benefit for once weekly statin.   Plan  Continue current medications  Add USP Co-Q10 1 tab daily for 14 days Follow with pharmacy at 14 days for increase in Lipitor 431mtwice weekly  Diabetes Mellitus   Lab Results  Component Value Date   HGBA1C 8.1 (A) 10/01/2019  .  Lab Results  Component Value Date   CREATININE 0.69 11/23/2018   CREATININE 0.79 05/25/2018   CREATININE 0.77 11/17/2017   Diabetes: type 2; complicated by chronic medical conditions including HTN, HLD, Patient has failed these meds in past:  Victoza Patient is currently uncontrolled on the following medications: metformin, Tresiba, Ozempic Current blood glucose readings:  (71*) 95 - 159 which don't match her A1c  Reports hypoglycemic symptoms, including dizziness, lightheadedness, shaking, sweating  Denies hyperglycemic symptoms, including polyuria, polydipsia, polyphagia, nocturia, blurred vision, neuropathy  Denies having had any issues with higher dose metformin in the past.  We discussed:  Patient A1c has exceeded 8% which is an increase from last year. Patient feels this is due to intermittent supply of expensive medication, diet excursions, and limited exercise.  Plan  Patient will record FSBG twice daily for 14 days and report to ccm pharmacy Continue current medications and inform pharmacy of any issues with PAP. Will increase walking and improve diet over the next 90 days.  Ca/D 630/500iu + 1000iu (start taking again) Increase metformin 100025mwice daily next visit Follow up Wed May 12, 10am  Medication Management   Pt uses [facility]  for all medications Uses pill box? Yes Pt endorses 100% compliance when meds available  We discussed:   Potential benefits of a local delivery pharmacy with pill packets available for organization. Patient expressed satisfaction with [employer] and concern about organization due to multiple medication sources (patient assistance).  Plan  Continue current medication management strategy  Follow up: 2 week telephone followup  High Cholesterol   High cholesterol is a condition in which the blood has high levels of a white, waxy, fat-like substance (cholesterol). The human body needs small amounts of cholesterol. The liver makes all the cholesterol that the body needs. Extra (excess) cholesterol comes from the food that we eat. Cholesterol is carried from the liver by the blood through the blood vessels. If you have high cholesterol, deposits (plaques) may build up on the  walls of your blood vessels (arteries). Plaques make the arteries narrower and stiffer. Cholesterol plaques increase your risk for heart attack and stroke. Work with your health care provider to keep your cholesterol levels in a healthy range. What increases the risk? This condition is more likely to develop in people who:  Eat foods that are high in animal fat (saturated fat) or cholesterol.  Are overweight.  Are not getting enough exercise.  Have a family history of high cholesterol. What are the signs or symptoms? There are no symptoms of this condition. How is this diagnosed? This condition may be diagnosed from the results of a blood test.  If you are older than age 34, your health care provider may check your cholesterol every 4-6 years.  You may be checked more often if you already have high cholesterol or other risk factors for heart disease. The blood test for cholesterol measures:  "Bad" cholesterol (LDL cholesterol). This is the main type of cholesterol that causes heart disease. The desired level for LDL is less than 100.  "Good" cholesterol (HDL cholesterol). This type helps to protect against heart disease by cleaning the arteries and carrying the LDL away. The desired level for HDL is 60 or higher.  Triglycerides. These are fats that the body can store or burn for energy. The desired number for triglycerides is lower than 150.  Total cholesterol. This is a measure of the total amount of cholesterol in your blood, including LDL cholesterol, HDL cholesterol, and triglycerides. A healthy number is less than 200. How is this treated? This condition is treated with diet changes, lifestyle changes, and medicines. Diet changes  This may include eating more whole grains, fruits, vegetables, nuts, and fish.  This may also include cutting back on red meat and foods that have a lot of added sugar. Lifestyle changes  Changes may include getting at least 40 minutes of aerobic  exercise 3 times a week. Aerobic exercises include walking, biking, and swimming. Aerobic exercise along with a healthy diet can help you maintain a healthy weight.  Changes may also include quitting smoking. Medicines  Medicines are usually given if diet and lifestyle changes have failed to reduce your cholesterol to healthy levels.  Your health care provider may prescribe a statin medicine. Statin medicines have been shown to reduce cholesterol, which can reduce the risk of heart disease. Follow these instructions at home: Eating and drinking  If told by your health care provider:  Eat chicken (without skin), fish, veal, shellfish, ground [place] breast, and round or loin cuts of red meat.  Do not eat fried foods or fatty meats, such as hot dogs and salami.  Eat  plenty of fruits, such as apples.  Eat plenty of vegetables, such as broccoli, potatoes, and carrots.  Eat beans, peas, and lentils.  Eat grains such as barley, rice, couscous, and bulgur wheat.  Eat pasta without cream sauces.  Use skim or nonfat milk, and eat low-fat or nonfat yogurt and cheeses.  Do not eat or drink whole milk, cream, ice cream, egg yolks, or hard cheeses.  Do not eat stick margarine or tub margarines that contain trans fats (also called partially hydrogenated oils).  Do not eat saturated tropical oils, such as coconut oil and palm oil.  Do not eat cakes, cookies, crackers, or other baked goods that contain trans fats.  General instructions  Exercise as directed by your health care provider. Increase your activity level with activities such as gardening, walking, and taking the stairs.  Take over-the-counter and prescription medicines only as told by your health care provider.  Do not use any products that contain nicotine or tobacco, such as cigarettes and e-cigarettes. If you need help quitting, ask your health care provider.  Keep all follow-up visits as told by your health care provider.  This is important. Contact a health care provider if:  You are struggling to maintain a healthy diet or weight.  You need help to start on an exercise program.  You need help to stop smoking. Get help right away if:  You have chest pain.  You have trouble breathing. This information is not intended to replace advice given to you by your health care provider. Make sure you discuss any questions you have with your health care provider. Document Revised: 06/27/2017 Document Reviewed: 12/23/2015 Elsevier Patient Education  [place].

## 2019-11-08 NOTE — Patient Instructions (Signed)
Visit Information  Goals Addressed            This Visit's Progress   . Hyperlipidemia - Goal LDL < 70 (pt-stated)       CARE PLAN ENTRY (see longitudinal plan of care for additional care plan information)  Current Barriers:  . Uncontrolled hyperlipidemia, complicated by statin intolerance . Current antihyperlipidemic regimen: Lipitor 40mg  weekly . Previous antihyperlipidemic medications tried Crestor . Most recent lipid panel:     Component Value Date/Time   CHOL 158 11/23/2018 0805   CHOL 190 04/04/2015 0810   TRIG 59 11/23/2018 0805   HDL 62 11/23/2018 0805   HDL 65 04/04/2015 0810   CHOLHDL 2.5 11/23/2018 0805   VLDL 23 05/24/2015 1020   LDLCALC 82 11/23/2018 0805 .   Marland Kitchen ASCVD risk enhancing conditions: age >34, DM, HTN, CKD, CHF, current smoker  Pharmacist Clinical Goal(s):  Marland Kitchen Over the next 90 days, patient will work with PharmD and providers towards optimized antihyperlipidemic therapy  Interventions: . Comprehensive medication review performed; medication list updated in electronic medical record.  Bertram Savin care team collaboration (see longitudinal plan of care) . Recommended Co-Q10 repletion for 14 days  Patient Self Care Activities:  . Patient will focus on medication adherence by taking 1 Co-Q10 tablet daily . With guidance from pharmacy slowly increase Lipitor to daily over a period of 90 days  Initial goal documentation     . Medication Management (pt-stated)       CARE PLAN ENTRY (see longitudinal plan of care for additional care plan information)  Current Barriers:  . Financial Barriers: patient has [facility] insurance and reports copay for Tresiba & Ozepmic is cost prohibitive at this time  Pharmacist Clinical Goal(s):  Marland Kitchen Maintain medication assistance progress established by former CCM pharmacist . Renew annually as needed  Interventions: . Comprehensive medication review completed; medication list updated in electronic  medical record.  Bertram Savin care team collaboration (see longitudinal plan of care)  Patient Self Care Activities:  . Patient will provide necessary portions of application  . Patient to contact CCM pharmacy annually and as needed to maintain flow of free medications from the manufacturer  Initial goal documentation        Ms. Niemeyer was given information about Chronic Care Management services today including:  1. CCM service includes personalized support from designated clinical staff supervised by her physician, including individualized plan of care and coordination with other care providers 2. 24/7 contact phone numbers for assistance for urgent and routine care needs. 3. Standard insurance, coinsurance, copays and deductibles apply for chronic care management only during months in which we provide at least 20 minutes of these services. Most insurances cover these services at 100%, however patients may be responsible for any copay, coinsurance and/or deductible if applicable. This service may help you avoid the need for more expensive face-to-face services. 4. Only one practitioner may furnish and bill the service in a calendar month. 5. The patient may stop CCM services at any time (effective at the end of the month) by phone call to the office staff.  Patient agreed to services and verbal consent obtained.   The patient verbalized understanding of instructions provided today and agreed to receive a mailed copy of patient instruction and/or educational materials. Telephone follow up appointment with pharmacy team member scheduled for:  Milus Height, PharmD, [place], [facility] [phone]

## 2019-11-17 ENCOUNTER — Other Ambulatory Visit: Payer: Self-pay | Admitting: Family Medicine

## 2019-11-17 ENCOUNTER — Other Ambulatory Visit: Payer: Self-pay

## 2019-11-17 ENCOUNTER — Ambulatory Visit: Payer: Self-pay | Admitting: Pharmacist

## 2019-11-17 DIAGNOSIS — E1121 Type 2 diabetes mellitus with diabetic nephropathy: Secondary | ICD-10-CM

## 2019-11-17 DIAGNOSIS — E785 Hyperlipidemia, unspecified: Secondary | ICD-10-CM

## 2019-11-17 MED ORDER — ATORVASTATIN CALCIUM 40 MG PO TABS: 40.0000 mg | ORAL_TABLET | ORAL | 1 refills | Status: DC

## 2019-11-17 NOTE — Patient Instructions (Addendum)
Visit Information  Goals Addressed            This Visit's Progress   . Hyperlipidemia - Goal LDL < 70 (pt-stated)       CARE PLAN ENTRY (see longitudinal plan of care for additional care plan information)  Current Barriers:  . Uncontrolled hyperlipidemia, complicated by statin intolerance . Current antihyperlipidemic regimen: Lipitor 40mg  weekly . Previous antihyperlipidemic medications tried Crestor . Most recent lipid panel:     Component Value Date/Time   CHOL 158 11/23/2018 0805   CHOL 190 04/04/2015 0810   TRIG 59 11/23/2018 0805   HDL 62 11/23/2018 0805   HDL 65 04/04/2015 0810   CHOLHDL 2.5 11/23/2018 0805   VLDL 23 05/24/2015 1020   LDLCALC 82 11/23/2018 0805 .   Marland Kitchen ASCVD risk enhancing conditions: age >83, DM, HTN, CKD, CHF, current smoker  Pharmacist Clinical Goal(s):  Marland Kitchen Over the next 90 days, patient will work with PharmD and providers towards optimized antihyperlipidemic therapy  Interventions: . Comprehensive medication review performed; medication list updated in electronic medical record.  Bertram Savin care team collaboration (see longitudinal plan of care) . Recommended Co-Q10 repletion for 14 days . Increase Lipitor to twice weekly  Patient Self Care Activities:  . Patient will focus on medication adherence by taking 1 Co-Q10 tablet daily . With guidance from pharmacy slowly increase Lipitor to daily over a period of 90 days . Increase Lipitor and report any adverse effects  Initial goal documentation        Print copy of patient instructions provided.   Face to Face appointment with pharmacist scheduled for: 1 month  Milus Height, PharmD, [place], [facility] [phone] Preventing High Cholesterol Cholesterol is a white, waxy substance similar to fat that the human body needs to help build cells. The liver makes all the cholesterol that a person's body needs. Having high cholesterol  (hypercholesterolemia) increases a person's risk for heart disease and stroke. Extra (excess) cholesterol comes from the food the person eats. High cholesterol can often be prevented with diet and lifestyle changes. If you already have high cholesterol, you can control it with diet and lifestyle changes and with medicine. How can high cholesterol affect me? If you have high cholesterol, deposits (plaques) may build up on the walls of your arteries. The arteries are the blood vessels that carry blood away from your heart. Plaques make the arteries narrower and stiffer. This can limit or block blood flow and cause blood clots to form. Blood clots:  Are tiny balls of cells that form in your blood.  Can move to the heart or brain, causing a heart attack or stroke. Plaques in arteries greatly increase your risk for heart attack and stroke.Making diet and lifestyle changes can reduce your risk for these conditions that may threaten your life. What can increase my risk? This condition is more likely to develop in people who:  Eat foods that are high in saturated fat or cholesterol. Saturated fat is mostly found in: ? Foods that contain animal fat, such as red meat and some dairy products. ? Certain fatty foods made from plants, such as tropical oils.  Are overweight.  Are not getting enough exercise.  Have a family history of high cholesterol. What actions can I take to prevent this? Nutrition   Eat less saturated fat.  Avoid trans fats (partially hydrogenated oils). These are often found in margarine and in some baked goods, fried foods, and snacks bought in  packages.  Avoid precooked or cured meat, such as sausages or meat loaves.  Avoid foods and drinks that have added sugars.  Eat more fruits, vegetables, and whole grains.  Choose healthy sources of protein, such as fish, poultry, lean cuts of red meat, beans, peas, lentils, and nuts.  Choose healthy sources of fat, such  as: ? Nuts. ? Vegetable oils, especially olive oil. ? Fish that have healthy fats (omega-3 fatty acids), such as mackerel or salmon. The items listed above may not be a complete list of recommended foods and beverages. Contact a dietitian for more information. Lifestyle  Lose weight if you are overweight. Losing 5-10 lb (2.3-4.5 kg) can help prevent or control high cholesterol. It can also lower your risk for diabetes and high blood pressure. Ask your health care provider to help you with a diet and exercise plan to lose weight safely.  Do not use any products that contain nicotine or tobacco, such as cigarettes, e-cigarettes, and chewing tobacco. If you need help quitting, ask your health care provider.  Limit your alcohol intake. ? Do not drink alcohol if:  Your health care provider tells you not to drink.  You are pregnant, may be pregnant, or are planning to become pregnant. ? If you drink alcohol:  Limit how much you use to:  0-1 drink a day for women.  0-2 drinks a day for men.  Be aware of how much alcohol is in your drink. In the [place] one drink equals one 12 oz bottle of beer (355 mL), one 5 oz glass of wine (148 mL), or one 1 oz glass of hard liquor (44 mL). Activity   Get enough exercise. Each week, do at least 150 minutes of exercise that takes a medium level of effort (moderate-intensity exercise). ? This is exercise that:  Makes your heart beat faster and makes you breathe harder than usual.  Allows you to still be able to talk. ? You could exercise in short sessions several times a day or longer sessions a few times a week. For example, on 5 days each week, you could walk fast or ride your bike 3 times a day for 10 minutes each time.  Do exercises as told by your health care provider. Medicines  In addition to diet and lifestyle changes, your health care provider may recommend medicines to help lower cholesterol. This may be a medicine to lower the amount of  cholesterol your liver makes. You may need medicine if: ? Diet and lifestyle changes do not lower your cholesterol enough. ? You have high cholesterol and other risk factors for heart disease or stroke.  Take over-the-counter and prescription medicines only as told by your health care provider. General information  Manage your risk factors for high cholesterol. Talk with your health care provider about all your risk factors and how to lower your risk.  Manage other conditions that you have, such as diabetes or high blood pressure (hypertension).  Have blood tests to check your cholesterol levels at regular points in time as told by your health care provider.  Keep all follow-up visits as told by your health care provider. This is important. Where to find more information  American Heart Association: www.heart.org  National Heart, Lung, and Blood Institute: https://wilson-eaton.com/ Summary  High cholesterol increases your risk for heart disease and stroke. By keeping your cholesterol level low, you can reduce your risk for these conditions.  High cholesterol can often be prevented with diet and lifestyle changes.  Work with your health care provider to manage your risk factors, and have your blood tested regularly. This information is not intended to replace advice given to you by your health care provider. Make sure you discuss any questions you have with your health care provider. Document Revised: 10/16/2018 Document Reviewed: 03/02/2016 Elsevier Patient Education  2020 [employer].

## 2019-11-17 NOTE — Progress Notes (Signed)
Chronic Care Management Pharmacy  Name: Christine Roberts  MRN: [id number] DOB: Oct 04, 1945  Chief Complaint/ HPI  Christine Roberts,  74 y.o. , female presents for their Follow-Up CCM visit with the clinical pharmacist via telephone due to COVID-19 Pandemic.  PCP : Steele Sizer, MD  Their chronic conditions include: HLD, DM  Office Visits: 4/28 HLD, Serene Kopf, start CoQ10 x 14d then inc Lipitor biwk  Consult Visit:NA  Medications: Outpatient Encounter Medications as of 11/17/2019  Medication Sig  . ACCU-CHEK SOFTCLIX LANCETS lancets 1 each by Other route daily.  . Alcohol Swabs (B-D SINGLE USE SWABS REGULAR) PADS 1 each by Does not apply route daily.  Marland Kitchen aspirin EC 81 MG tablet Take 1 tablet (81 mg total) by mouth daily.  Derrill Memo ON 11/18/2019] atorvastatin (LIPITOR) 40 MG tablet Take 1 tablet (40 mg total) by mouth 2 (two) times a week.  . blood glucose meter kit and supplies KIT Dispense based on patient and insurance preference. Use up to four times daily as directed. (FOR ICD-9 250.00, 250.01).  . calcium citrate (CALCITRATE - DOSED IN MG ELEMENTAL CALCIUM) 950 MG tablet Take 200 mg of elemental calcium by mouth daily. Has D3 in it as well  . fluticasone (FLONASE) 50 MCG/ACT nasal spray Place 2 sprays into both nostrils daily. (Patient not taking: Reported on 11/03/2019)  . glucose blood (ACCU-CHEK AVIVA PLUS) test strip TEST BLOOD SUGAR UP TO FOUR TIMES DAILY AS DIRECTED  . hydrocortisone (ANUSOL-HC) 25 MG suppository Place rectally.  . Insulin Degludec (TRESIBA FLEXTOUCH) 200 UNIT/ML SOPN Inject 50 Units into the skin daily. (Patient taking differently: Inject 50 Units into the skin daily. Pt taking 34-36 units daily)  . Insulin Pen Needle (NOVOFINE) 30G X 8 MM MISC Inject 10 each into the skin as needed.  Elmore Guise Devices MISC 1 Units by Does not apply route 2 (two) times daily.  Marland Kitchen lisinopril (ZESTRIL) 20 MG tablet TAKE 1 TABLET EVERY DAY  . metFORMIN (GLUCOPHAGE-XR) 750 MG 24 hr  tablet Take 1 tablet (750 mg total) by mouth daily with breakfast.  . polyethylene glycol powder (GLYCOLAX/MIRALAX) powder Take 17 g by mouth 2 (two) times daily as needed. (Patient not taking: Reported on 11/03/2019)  . Semaglutide,0.25 or 0.5MG/DOS, (OZEMPIC, 0.25 OR 0.5 MG/DOSE,) 2 MG/1.5ML SOPN Inject 0.5 mg into the skin once a week.  Marland Kitchen VITAMIN B COMPLEX-C PO Take by mouth.  . [DISCONTINUED] atorvastatin (LIPITOR) 40 MG tablet TAKE 1 TABLET ONE TIME WEEKLY   No facility-administered encounter medications on file as of 11/17/2019.     Current Diagnosis/Assessment:  Goals Addressed            This Visit's Progress   . Hyperlipidemia - Goal LDL < 70 (pt-stated)       CARE PLAN ENTRY (see longitudinal plan of care for additional care plan information)  Current Barriers:  . Uncontrolled hyperlipidemia, complicated by statin intolerance . Current antihyperlipidemic regimen: Lipitor 19m weekly . Previous antihyperlipidemic medications tried Crestor . Most recent lipid panel:     Component Value Date/Time   CHOL 158 11/23/2018 0805   CHOL 190 04/04/2015 0810   TRIG 59 11/23/2018 0805   HDL 62 11/23/2018 0805   HDL 65 04/04/2015 0810   CHOLHDL 2.5 11/23/2018 0805   VLDL 23 05/24/2015 1020   LDLCALC 82 11/23/2018 0805 .   .Marland KitchenASCVD risk enhancing conditions: age >>50 DM, HTN, CKD, CHF, current smoker  Pharmacist Clinical Goal(s):  .Marland KitchenOver the next  90 days, patient will work with PharmD and providers towards optimized antihyperlipidemic therapy  Interventions: . Comprehensive medication review performed; medication list updated in electronic medical record.  Bertram Savin care team collaboration (see longitudinal plan of care) . Recommended Co-Q10 repletion for 14 days . Increase Lipitor to twice weekly  Patient Self Care Activities:  . Patient will focus on medication adherence by taking 1 Co-Q10 tablet daily . With guidance from pharmacy slowly increase Lipitor to  daily over a period of 90 days . Increase Lipitor and report any adverse effects  Initial goal documentation         Financial Resource Strain: High Risk  . Difficulty of Paying Living Expenses: Very hard   Diabetes   Recent Relevant Labs: Lab Results  Component Value Date/Time   HGBA1C 8.1 (A) 10/01/2019 10:39 AM   HGBA1C 8.1 (A) 03/24/2019 12:12 PM   HGBA1C 7.7 (H) 11/23/2018 08:05 AM   HGBA1C 9.1 (A) 07/21/2018 11:48 AM   HGBA1C 8.1 (A) 03/30/2018 04:05 PM   MICROALBUR <0.2 11/23/2018 08:05 AM   MICROALBUR 0.4 11/17/2017 02:15 PM   MICROALBUR 20 01/10/2017 01:39 PM   MICROALBUR 20 09/12/2015 03:42 PM     Checking BG: Daily  Recent FBG Readings: 150-311  Recent 2hr PP BG readings:  311  Patient has failed these meds in past: NA Patient is currently uncontrolled on the following medications: Tresiba, metformin  Last diabetic Foot exam:  Lab Results  Component Value Date/Time   HMDIABEYEEXA No Retinopathy 04/01/2018 12:00 AM    Last diabetic Eye exam: No results found for: HMDIABFOOTEX   Patient completed test FSBG 2h PP. Much higher than fasting. We discussed: ate pasta before high Wants to go back to the gym  For 9.1% had a mini stroke maybe, high BP, not Tresiba Gets hypoglycemia when active and not eating, over 2 weeks, can feel it dropping Has happened while driving Less exercise Eats out three times weekly, counseled Test foods at noon  Plan Consider Ozempic, likely requiring PAP Patient to test FSBG 2hPP lunch Patient to decrease eating out Continue current medications  Hyperlipidemia   Lipid Panel     Component Value Date/Time   CHOL 158 11/23/2018 0805   CHOL 190 04/04/2015 0810   TRIG 59 11/23/2018 0805   HDL 62 11/23/2018 0805   HDL 65 04/04/2015 0810   CHOLHDL 2.5 11/23/2018 0805   VLDL 23 05/24/2015 1020   LDLCALC 82 11/23/2018 0805   LABVLDL 28 04/04/2015 0810     Patient has failed these meds in past: Lipitor daily Patient is  currently controlled on the following medications: Lipitor 43m weekly  We discussed:  May need more Lipitor going to twice weekly  Plan  Increase Lipitor to twice weekly starting today [place]  TMilus Height PharmD, [facility] [phone]

## 2019-12-02 ENCOUNTER — Telehealth: Payer: Self-pay | Admitting: Family Medicine

## 2019-12-02 NOTE — Chronic Care Management (AMB) (Signed)
  Care Management   Note  12/02/2019 Name: Christine Roberts MRN: [id number] DOB: 11-21-1945  Christine Roberts is a 74 y.o. year old female who is a primary care patient of Steele Sizer, MD and is actively engaged with the care management team. I reached out to Christine Roberts by phone today to assist with re-scheduling a follow up visit with the Pharmacist.  Follow up plan: Telephone appointment with care management team member scheduled for:12/21/2019  [place], [place] [facility]  [place], [place] [address] Direct Dial: [email or web address]: [email or web address]

## 2019-12-21 ENCOUNTER — Telehealth: Payer: Self-pay | Admitting: Family Medicine

## 2019-12-21 ENCOUNTER — Other Ambulatory Visit: Payer: Self-pay

## 2019-12-21 ENCOUNTER — Ambulatory Visit: Payer: Self-pay | Admitting: Pharmacist

## 2019-12-21 DIAGNOSIS — E1121 Type 2 diabetes mellitus with diabetic nephropathy: Secondary | ICD-10-CM

## 2019-12-21 DIAGNOSIS — E785 Hyperlipidemia, unspecified: Secondary | ICD-10-CM

## 2019-12-21 NOTE — Patient Instructions (Addendum)
Visit Information  Goals Addressed              This Visit's Progress   .  Hyperlipidemia - Goal LDL < 70 (pt-stated)        CARE PLAN ENTRY (see longitudinal plan of care for additional care plan information)  Current Barriers:  . Uncontrolled hyperlipidemia, complicated by statin intolerance . Current antihyperlipidemic regimen: Lipitor 40mg  weekly . Previous antihyperlipidemic medications tried Crestor . Most recent lipid panel:     Component Value Date/Time   CHOL 158 11/23/2018 0805   CHOL 190 04/04/2015 0810   TRIG 59 11/23/2018 0805   HDL 62 11/23/2018 0805   HDL 65 04/04/2015 0810   CHOLHDL 2.5 11/23/2018 0805   VLDL 23 05/24/2015 1020   LDLCALC 82 11/23/2018 0805 .   Marland Kitchen ASCVD risk enhancing conditions: age >62, DM, HTN, CKD, CHF, current smoker  Pharmacist Clinical Goal(s):  Marland Kitchen Over the next 90 days, patient will work with PharmD and providers towards optimized antihyperlipidemic therapy  Interventions: . Comprehensive medication review performed; medication list updated in electronic medical record.  Christine Roberts care team collaboration (see longitudinal plan of care) . Recommended Co-Q10 repletion for 14 days . Increase Lipitor to twice weekly  Patient Self Care Activities:  . Patient will focus on medication adherence by taking 1 Co-Q10 tablet daily . With guidance from pharmacy slowly increase Lipitor to daily over a period of 90 days . Increase Lipitor and report any adverse effects . Patient taking CoQ10 and has doubled the Lipitor to twice weekly  Initial goal documentation        Christine Roberts was given information about Chronic Care Management services today including:  1. CCM service includes personalized support from designated clinical staff supervised by her physician, including individualized plan of care and coordination with other care providers 2. 24/7 contact phone numbers for assistance for urgent and routine care needs. 3. Standard  insurance, coinsurance, copays and deductibles apply for chronic care management only during months in which we provide at least 20 minutes of these services. Most insurances cover these services at 100%, however patients may be responsible for any copay, coinsurance and/or deductible if applicable. This service may help you avoid the need for more expensive face-to-face services. 4. Only one practitioner may furnish and bill the service in a calendar month. 5. The patient may stop CCM services at any time (effective at the end of the month) by phone call to the office staff.  Patient agreed to services and verbal consent obtained.   Print copy of patient instructions provided.  Telephone follow up appointment with pharmacy team member scheduled for: 3 months  Milus Height, PharmD, [place], [facility] [phone] Dyslipidemia Dyslipidemia is an imbalance of waxy, fat-like substances (lipids) in the blood. The body needs lipids in small amounts. Dyslipidemia often involves a high level of cholesterol or triglycerides, which are types of lipids. Common forms of dyslipidemia include:  High levels of LDL cholesterol. LDL is the type of cholesterol that causes fatty deposits (plaques) to build up in the blood vessels that carry blood away from your heart (arteries).  Low levels of HDL cholesterol. HDL cholesterol is the type of cholesterol that protects against heart disease. High levels of HDL remove the LDL buildup from arteries.  High levels of triglycerides. Triglycerides are a fatty substance in the blood that is linked to a buildup of plaques in the arteries. What are the causes? Primary dyslipidemia is  caused by changes (mutations) in genes that are passed down through families (inherited). These mutations cause several types of dyslipidemia. Secondary dyslipidemia is caused by lifestyle choices and diseases that lead to dyslipidemia, such as:  Eating  a diet that is high in animal fat.  Not getting enough exercise.  Having diabetes, kidney disease, liver disease, or thyroid disease.  Drinking large amounts of alcohol.  Using certain medicines. What increases the risk? You are more likely to develop this condition if you are an older man or if you are a woman who has gone through menopause. Other risk factors include:  Having a family history of dyslipidemia.  Taking certain medicines, including birth control pills, steroids, some diuretics, and beta-blockers.  Smoking cigarettes.  Eating a high-fat diet.  Having certain medical conditions such as diabetes, polycystic ovary syndrome (PCOS), kidney disease, liver disease, or hypothyroidism.  Not exercising regularly.  Being overweight or obese with too much belly fat. What are the signs or symptoms? In most cases, dyslipidemia does not usually cause any symptoms. In severe cases, very high lipid levels can cause:  Fatty bumps under the skin (xanthomas).  White or gray ring around the black center (pupil) of the eye. Very high triglyceride levels can cause inflammation of the pancreas (pancreatitis). How is this diagnosed? Your health care provider may diagnose dyslipidemia based on a routine blood test (fasting blood test). Because most people do not have symptoms of the condition, this blood testing (lipid profile) is done on adults age 40 and older and is repeated every 5 years. This test checks:  Total cholesterol. This measures the total amount of cholesterol in your blood, including LDL cholesterol, HDL cholesterol, and triglycerides. A healthy number is below 200.  LDL cholesterol. The target number for LDL cholesterol is different for each person, depending on individual risk factors. Ask your health care provider what your LDL cholesterol should be.  HDL cholesterol. An HDL level of 60 or higher is best because it helps to protect against heart disease. A number below  2 for men or below 59 for women increases the risk for heart disease.  Triglycerides. A healthy triglyceride number is below 150. If your lipid profile is abnormal, your health care provider may do other blood tests. How is this treated? Treatment depends on the type of dyslipidemia that you have and your other risk factors for heart disease and stroke. Your health care provider will have a target range for your lipid levels based on this information. For many people, this condition may be treated by lifestyle changes, such as diet and exercise. Your health care provider may recommend that you:  Get regular exercise.  Make changes to your diet.  Quit smoking if you smoke. If diet changes and exercise do not help you reach your goals, your health care provider may also prescribe medicine to lower lipids. The most commonly prescribed type of medicine lowers your LDL cholesterol (statin drug). If you have a high triglyceride level, your provider may prescribe another type of drug (fibrate) or an omega-3 fish oil supplement, or both. Follow these instructions at home:  Eating and drinking  Follow instructions from your health care provider or dietitian about eating or drinking restrictions.  Eat a healthy diet as told by your health care provider. This can help you reach and maintain a healthy weight, lower your LDL cholesterol, and raise your HDL cholesterol. This may include: ? Limiting your calories, if you are overweight. ? Eating more  fruits, vegetables, whole grains, fish, and lean meats. ? Limiting saturated fat, trans fat, and cholesterol.  If you drink alcohol: ? Limit how much you use. ? Be aware of how much alcohol is in your drink. In the [place] one drink equals one 12 oz bottle of beer (355 mL), one 5 oz glass of wine (148 mL), or one 1 oz glass of hard liquor (44 mL).  Do not drink alcohol if: ? Your health care provider tells you not to drink. ? You are pregnant, may be  pregnant, or are planning to become pregnant. Activity  Get regular exercise. Start an exercise and strength training program as told by your health care provider. Ask your health care provider what activities are safe for you. Your health care provider may recommend: ? 30 minutes of aerobic activity 4-6 days a week. Brisk walking is an example of aerobic activity. ? Strength training 2 days a week. General instructions  Do not use any products that contain nicotine or tobacco, such as cigarettes, e-cigarettes, and chewing tobacco. If you need help quitting, ask your health care provider.  Take over-the-counter and prescription medicines only as told by your health care provider. This includes supplements.  Keep all follow-up visits as told by your health care provider. Contact a health care provider if:  You are: ? Having trouble sticking to your exercise or diet plan. ? Struggling to quit smoking or control your use of alcohol. Summary  Dyslipidemia often involves a high level of cholesterol or triglycerides, which are types of lipids.  Treatment depends on the type of dyslipidemia that you have and your other risk factors for heart disease and stroke.  For many people, treatment starts with lifestyle changes, such as diet and exercise.  Your health care provider may prescribe medicine to lower lipids. This information is not intended to replace advice given to you by your health care provider. Make sure you discuss any questions you have with your health care provider. Document Revised: 02/16/2018 Document Reviewed: 01/23/2018 Elsevier Patient Education  [place].

## 2019-12-21 NOTE — Telephone Encounter (Signed)
Semaglutide,0.25 or 0.5MG /DOS, (OZEMPIC, 0.25 OR 0.5 MG/DOSE,) 2 MG/1.5ML SOPN    Patient requesting to speak with clinical staff regarding this medication.

## 2019-12-21 NOTE — Chronic Care Management (AMB) (Signed)
Chronic Care Management Pharmacy  Name: Christine Roberts  MRN: [id number] DOB: Jun 05, 1946  Chief Complaint/ HPI  Christine Roberts,  74 y.o. , female presents for their Follow-Up CCM visit with the clinical pharmacist via telephone due to COVID-19 Pandemic.  PCP : Steele Sizer, MD  Their chronic conditions include: HLD, HTN, DM  Office Visits: NA  Consult Visit: NA  Medications: Outpatient Encounter Medications as of 12/21/2019  Medication Sig  . ACCU-CHEK SOFTCLIX LANCETS lancets 1 each by Other route daily.  . Alcohol Swabs (B-D SINGLE USE SWABS REGULAR) PADS 1 each by Does not apply route daily.  Marland Kitchen aspirin EC 81 MG tablet Take 1 tablet (81 mg total) by mouth daily.  Marland Kitchen atorvastatin (LIPITOR) 40 MG tablet Take 1 tablet (40 mg total) by mouth 2 (two) times a week.  . blood glucose meter kit and supplies KIT Dispense based on patient and insurance preference. Use up to four times daily as directed. (FOR ICD-9 250.00, 250.01).  . calcium citrate (CALCITRATE - DOSED IN MG ELEMENTAL CALCIUM) 950 MG tablet Take 200 mg of elemental calcium by mouth daily. Has D3 in it as well  . fluticasone (FLONASE) 50 MCG/ACT nasal spray Place 2 sprays into both nostrils daily. (Patient not taking: Reported on 11/03/2019)  . glucose blood (ACCU-CHEK AVIVA PLUS) test strip TEST BLOOD SUGAR UP TO FOUR TIMES DAILY AS DIRECTED  . hydrocortisone (ANUSOL-HC) 25 MG suppository Place rectally.  . Insulin Degludec (TRESIBA FLEXTOUCH) 200 UNIT/ML SOPN Inject 50 Units into the skin daily. (Patient taking differently: Inject 50 Units into the skin daily. Pt taking 34-36 units daily)  . Insulin Pen Needle (NOVOFINE) 30G X 8 MM MISC Inject 10 each into the skin as needed.  Elmore Guise Devices MISC 1 Units by Does not apply route 2 (two) times daily.  Marland Kitchen lisinopril (ZESTRIL) 20 MG tablet TAKE 1 TABLET EVERY DAY  . metFORMIN (GLUCOPHAGE-XR) 750 MG 24 hr tablet Take 1 tablet (750 mg total) by mouth daily with breakfast.  .  polyethylene glycol powder (GLYCOLAX/MIRALAX) powder Take 17 g by mouth 2 (two) times daily as needed. (Patient not taking: Reported on 11/03/2019)  . Semaglutide,0.25 or 0.5MG/DOS, (OZEMPIC, 0.25 OR 0.5 MG/DOSE,) 2 MG/1.5ML SOPN Inject 0.5 mg into the skin once a week.  Marland Kitchen VITAMIN B COMPLEX-C PO Take by mouth.   No facility-administered encounter medications on file as of 12/21/2019.     Financial Resource Strain: High Risk  . Difficulty of Paying Living Expenses: Very hard    Current Diagnosis/Assessment:  Goals Addressed              This Visit's Progress   .  Hyperlipidemia - Goal LDL < 70 (pt-stated)        CARE PLAN ENTRY (see longitudinal plan of care for additional care plan information)  Current Barriers:  . Uncontrolled hyperlipidemia, complicated by statin intolerance . Current antihyperlipidemic regimen: Lipitor 38m weekly . Previous antihyperlipidemic medications tried Crestor . Most recent lipid panel:     Component Value Date/Time   CHOL 158 11/23/2018 0805   CHOL 190 04/04/2015 0810   TRIG 59 11/23/2018 0805   HDL 62 11/23/2018 0805   HDL 65 04/04/2015 0810   CHOLHDL 2.5 11/23/2018 0805   VLDL 23 05/24/2015 1020   LDLCALC 82 11/23/2018 0805 .   .Marland KitchenASCVD risk enhancing conditions: age >>55 DM, HTN, CKD, CHF, current smoker  Pharmacist Clinical Goal(s):  .Marland KitchenOver the next 90 days, patient will  work with PharmD and providers towards optimized antihyperlipidemic therapy  Interventions: . Comprehensive medication review performed; medication list updated in electronic medical record.  Bertram Savin care team collaboration (see longitudinal plan of care) . Recommended Co-Q10 repletion for 14 days . Increase Lipitor to twice weekly  Patient Self Care Activities:  . Patient will focus on medication adherence by taking 1 Co-Q10 tablet daily . With guidance from pharmacy slowly increase Lipitor to daily over a period of 90 days . Increase Lipitor and  report any adverse effects . Patient taking CoQ10 and has doubled the Lipitor to twice weekly  Initial goal documentation       Diabetes   Recent Relevant Labs: Lab Results  Component Value Date/Time   HGBA1C 8.1 (A) 10/01/2019 10:39 AM   HGBA1C 8.1 (A) 03/24/2019 12:12 PM   HGBA1C 7.7 (H) 11/23/2018 08:05 AM   HGBA1C 9.1 (A) 07/21/2018 11:48 AM   HGBA1C 8.1 (A) 03/30/2018 04:05 PM   MICROALBUR <0.2 11/23/2018 08:05 AM   MICROALBUR 0.4 11/17/2017 02:15 PM   MICROALBUR 20 01/10/2017 01:39 PM   MICROALBUR 20 09/12/2015 03:42 PM    Checking BG: Daily Recent 2hr PP BG readings:  137, 191, 211 Recent HS BG readings: 150 Patient has failed these meds in past: NA Patient is currently uncontrolled on the following medications: metformin  Last diabetic Foot exam:  Lab Results  Component Value Date/Time   HMDIABEYEEXA No Retinopathy 04/01/2018 12:00 AM    Last diabetic Eye exam: No results found for: HMDIABFOOTEX   We discussed:  Eating out 2hPP readings  Plan Still waiting on Ozempic Patient to call [employer]  Continue current medications  Hyperlipidemia   Lipid Panel     Component Value Date/Time   CHOL 158 11/23/2018 0805   CHOL 190 04/04/2015 0810   TRIG 59 11/23/2018 0805   HDL 62 11/23/2018 0805   HDL 65 04/04/2015 0810   LDLCALC 82 11/23/2018 0805     The 10-year ASCVD risk score (Goff DC Jr., et al., 2013) is: 37.1%   Values used to calculate the score:     Age: 63 years     Sex: Female     Is Non-Hispanic African American: No     Diabetic: Yes     Tobacco smoker: No     Systolic Blood Pressure: 968 mmHg     Is BP treated: Yes     HDL Cholesterol: 62 mg/dL     Total Cholesterol: 158 mg/dL   Patient has failed these meds in past: Lipitor daily Patient is currently uncontrolled on the following medications:  . Lipitor 85m twice weekly  We discussed:   Taking CoQ10? Yes, daily 1021m Taking increased Lipitor dose? Yes, no cramps  Plan  Continue  current medications  TeMilus HeightPharmD, BCKernersvilleCTKilleen Medical [phone]

## 2019-12-22 ENCOUNTER — Telehealth: Payer: Self-pay | Admitting: Pharmacist

## 2019-12-22 ENCOUNTER — Other Ambulatory Visit: Payer: Self-pay | Admitting: Family Medicine

## 2019-12-22 ENCOUNTER — Telehealth: Payer: Self-pay

## 2019-12-22 DIAGNOSIS — E1121 Type 2 diabetes mellitus with diabetic nephropathy: Secondary | ICD-10-CM

## 2019-12-22 MED ORDER — OZEMPIC (0.25 OR 0.5 MG/DOSE) 2 MG/1.5ML ~~LOC~~ SOPN: 0.5000 mg | PEN_INJECTOR | SUBCUTANEOUS | 1 refills | Status: DC

## 2019-12-22 NOTE — Telephone Encounter (Cosign Needed)
In a phone call with Ms Labella, she said that [facility] did not receive the Ozempic Rx back in March. They also, told her they would not pay. Checked with [facility] pharmacists and it's definitely preferred by [facility]. It may even get cheaper.  Ozempic 0.5mg  inj once weekly

## 2019-12-22 NOTE — Telephone Encounter (Signed)
I spoke with Patient and she said she has not received any of the Ozempic and was wondering if you had changed your mind on prescribing it for her. I looked and it appeared it had been sent to [facility] but when I called them to check the status they said they had not received the prescription or it had been cancelled. Patient would like to know if you want her to take it and if you can send it to the pharmacy so she can start it. She said she has been asking since March 31,2021 and has not received a clear answer from her Pharmacist or anyone else. I told her I would investigate and give her a call back.

## 2019-12-24 ENCOUNTER — Other Ambulatory Visit: Payer: Self-pay

## 2019-12-24 MED ORDER — TRUE METRIX LEVEL 1 LOW VI SOLN: 1.0000 | Freq: Every day | 3 refills | Status: DC

## 2019-12-27 ENCOUNTER — Other Ambulatory Visit: Payer: Self-pay

## 2020-02-09 ENCOUNTER — Ambulatory Visit (INDEPENDENT_AMBULATORY_CARE_PROVIDER_SITE_OTHER): Payer: Medicare HMO | Admitting: Family Medicine

## 2020-02-09 ENCOUNTER — Encounter: Payer: Self-pay | Admitting: Family Medicine

## 2020-02-09 ENCOUNTER — Other Ambulatory Visit: Payer: Self-pay

## 2020-02-09 VITALS — BP 126/80 | HR 97 | Temp 98.3°F | Resp 16 | Ht 64.0 in | Wt 203.0 lb

## 2020-02-09 DIAGNOSIS — I700 Atherosclerosis of aorta: Secondary | ICD-10-CM | POA: Diagnosis not present

## 2020-02-09 DIAGNOSIS — E1121 Type 2 diabetes mellitus with diabetic nephropathy: Secondary | ICD-10-CM

## 2020-02-09 DIAGNOSIS — Z8673 Personal history of transient ischemic attack (TIA), and cerebral infarction without residual deficits: Secondary | ICD-10-CM | POA: Diagnosis not present

## 2020-02-09 DIAGNOSIS — D692 Other nonthrombocytopenic purpura: Secondary | ICD-10-CM

## 2020-02-09 DIAGNOSIS — N200 Calculus of kidney: Secondary | ICD-10-CM | POA: Diagnosis not present

## 2020-02-09 DIAGNOSIS — I10 Essential (primary) hypertension: Secondary | ICD-10-CM | POA: Diagnosis not present

## 2020-02-09 DIAGNOSIS — E538 Deficiency of other specified B group vitamins: Secondary | ICD-10-CM

## 2020-02-09 LAB — POCT GLYCOSYLATED HEMOGLOBIN (HGB A1C): Hemoglobin A1C: 7.2 % — AB (ref 4.0–5.6)

## 2020-02-09 NOTE — Progress Notes (Signed)
Name: Christine Roberts   MRN: [id number]    DOB: July 23, 1945   Date:02/09/2020       Progress Note  Subjective  Chief Complaint  Chief Complaint  Patient presents with  . Follow-up  . Diabetes  . Hypertension    HPI  DMII: she is on Metformin, T[place]ibanow at 36units, and since June 2021 also on Ozempic but only using 0.25 mg because of cost. She is doing great , lost 8 lbs and A1C is down from 8.1 % to 7.2 %. Since she is 74 yo I am pleased with her A1C level, keep it close to 7 % She is on ace for microalbuminuria and denies side effects of medications.She is compliant with statin therapy and also baby aspirin   HTN : compliant with medication,  no chest pain, dizzinessor palpitation.Denies side effects of medications   Obesity:weight is gradually going up again, BMI is now below 35  , Ozempic has curbed her appetite, eating smaller portions , lost 8 lbs since last visit   Hyperlipidemia: taking lipitorone two or three times aweek to avoid side effects, otherwise she has leg pains.   TIA versus Complicated migraine: she developed dizziness, headache and aphasia on 11/16 - she was transported by EMS to [facility], she had normal Echo, CT, MRI of brain and carotid dopplers. She was seen by neurologist and advised to continue on aspirin and lipitor in case it was a TIA, but likely a complicated migraine. She has been asymptomatic. Still no recurrence of episodes   B12 deficiency:still not back on supplementation. Unchanged   Nephrolithiasis:small on on right kidney and urologist does not think it is the cause of her pain, unchanged   Senile purpura:  skin very thin, has it on both arms today   Atherosclerosis of aorta: continue statin therapy and aspirin 81 mg daily, she is now taking Atorvastatin a little more often from once a week to two to three times a week, advised to try to go up to every other day   Grade III hemorrhoids; seen by Dr. Alice Reichert - GI and was referred to  general surgeon for possible banding, but she tried suppositories, also advised to increase fiber in her diet and if no improvement of rectal bleeding , she states she wants to wait for the banding to be done during her next colonoscopy , next one scheduled for 2024   Patient Active Problem List   Diagnosis Date Noted  . Morbid obesity ([place]) 07/21/2018  . Atherosclerosis of aorta ([place]) 05/25/2018  . Seasonal allergic rhinitis 09/12/2015  . TIA (transient ischemic attack) 05/24/2015  . Senile purpura ([place]) 05/09/2015  . Benign essential HTN 03/18/2015  . Pain in shoulder 03/18/2015  . Dyslipidemia 03/18/2015  . History of surgery to major organs, presenting hazards to health 03/18/2015  . Hearing loss 03/18/2015  . Hiatal hernia 03/18/2015  . History of colon polyps 03/18/2015  . Calculus of kidney 03/18/2015  . B12 deficiency 03/18/2015  . Microalbuminuria 03/18/2015  . Adult BMI 30+ 03/18/2015  . Arthritis, degenerative 03/18/2015  . Diabetes mellitus with nephropathy ([place]) 03/18/2015    Past Surgical History:  Procedure Laterality Date  . ABDOMINAL HYSTERECTOMY    . CHOLECYSTECTOMY    . COLONOSCOPY  2015  . COLONOSCOPY WITH PROPOFOL N/A 03/04/2018   Procedure: COLONOSCOPY WITH PROPOFOL;  Surgeon: Toledo, Benay Pike, MD;  Location: [facility] ENDOSCOPY;  Service: Gastroenterology;  Laterality: N/A;  . cystoscopic     stent placed in ureter  .  LITHOTRIPSY  12/2007  . MYRINGOTOMY Right 11/2009    Family History  Problem Relation Age of Onset  . Cancer Mother   . Diabetes Mother   . Diabetes Daughter   . CAD Father   . Diabetes Daughter        hypoglycemia    Social History   Tobacco Use  . Smoking status: Never Smoker  . Smokeless tobacco: Never Used  . Tobacco comment: smoking cessation materials not required  Substance Use Topics  . Alcohol use: Yes    Alcohol/week: 1.0 standard drink    Types: 1 Shots of liquor per week    Comment: socially     Current  Outpatient Medications:  .  ACCU-CHEK SOFTCLIX LANCETS lancets, 1 each by Other route daily., Disp: 100 each, Rfl: 1 .  aspirin EC 81 MG tablet, Take 1 tablet (81 mg total) by mouth daily., Disp: 90 tablet, Rfl: 0 .  atorvastatin (LIPITOR) 40 MG tablet, Take 1 tablet (40 mg total) by mouth 2 (two) times a week., Disp: 24 tablet, Rfl: 1 .  Blood Glucose Calibration (TRUE METRIX LEVEL 1) Low SOLN, 1 each by In Vitro route daily., Disp: 1 each, Rfl: 3 .  blood glucose meter kit and supplies KIT, Dispense based on patient and insurance preference. Use up to four times daily as directed. (FOR ICD-9 250.00, 250.01)., Disp: 1 each, Rfl: 0 .  calcium citrate (CALCITRATE - DOSED IN MG ELEMENTAL CALCIUM) 950 MG tablet, Take 200 mg of elemental calcium by mouth daily. Has D3 in it as well, Disp: , Rfl:  .  Coenzyme Q10 (COQ10) 100 MG CAPS, Take 1 capsule by mouth daily., Disp: , Rfl:  .  glucose blood (ACCU-CHEK AVIVA PLUS) test strip, TEST BLOOD SUGAR UP TO FOUR TIMES DAILY AS DIRECTED, Disp: 400 each, Rfl: 0 .  Insulin Degludec (TRESIBA FLEXTOUCH) 200 UNIT/ML SOPN, Inject 50 Units into the skin daily. (Patient taking differently: Inject 50 Units into the skin daily. Pt taking 34-36 units daily), Disp: 27 mL, Rfl: 0 .  Insulin Pen Needle (NOVOFINE) 30G X 8 MM MISC, Inject 10 each into the skin as needed., Disp: 100 each, Rfl: 2 .  Lancet Devices MISC, 1 Units by Does not apply route 2 (two) times daily., Disp: 100 each, Rfl: 3 .  lisinopril (ZESTRIL) 20 MG tablet, TAKE 1 TABLET EVERY DAY, Disp: 90 tablet, Rfl: 0 .  metFORMIN (GLUCOPHAGE-XR) 750 MG 24 hr tablet, Take 1 tablet (750 mg total) by mouth daily with breakfast., Disp: 180 tablet, Rfl: 1 .  polyethylene glycol powder (GLYCOLAX/MIRALAX) powder, Take 17 g by mouth 2 (two) times daily as needed., Disp: 3350 g, Rfl: 1 .  Semaglutide,0.25 or 0.5MG/DOS, (OZEMPIC, 0.25 OR 0.5 MG/DOSE,) 2 MG/1.5ML SOPN, Inject 0.375 mLs (0.5 mg total) into the skin once a  week., Disp: 6 pen, Rfl: 1 .  VITAMIN B COMPLEX-C PO, Take by mouth., Disp: , Rfl:  .  Alcohol Swabs (B-D SINGLE USE SWABS REGULAR) PADS, 1 each by Does not apply route daily., Disp: 400 each, Rfl: 1 .  fluticasone (FLONASE) 50 MCG/ACT nasal spray, Place 2 sprays into both nostrils daily. (Patient not taking: Reported on 11/03/2019), Disp: 16 g, Rfl: 6  Allergies  Allergen Reactions  . Latex Rash  . Erythromycin Other (See Comments)    Strawberry tongue    I personally reviewed active problem list, medication list, allergies, family history, social history with the patient/caregiver today.   ROS  Constitutional: Negative  for fever , positive for weight change.  Respiratory: Negative for cough and shortness of breath.   Cardiovascular: Negative for chest pain or palpitations.  Gastrointestinal: Negative for abdominal pain, no bowel changes.  Musculoskeletal: Negative for gait problem or joint swelling.  Skin: Negative for rash, but has senile purpura   Neurological: Negative for dizziness or headache.  No other specific complaints in a complete review of systems (except as listed in HPI above).  Objective  Vitals:   02/09/20 1042  BP: 126/80  Pulse: 97  Resp: 16  Temp: 98.3 F (36.8 C)  TempSrc: Temporal  SpO2: 98%  Weight: 203 lb (92.1 kg)  Height: 5' 4"  (1.626 m)    Body mass index is 34.84 kg/m.  Physical Exam  Constitutional: Patient appears well-developed and well-nourished. Obese  No distress.  HEENT: head atraumatic, normocephalic, pupils equal and reactive to light, neck supple Cardiovascular: Normal rate, regular rhythm and normal heart sounds.  No murmur heard. No BLE edema. Pulmonary/Chest: Effort normal and breath sounds normal. No respiratory distress. Abdominal: Soft.  There is no tenderness. Psychiatric: Patient has a normal mood and affect. behavior is normal. Judgment and thought content normal.  Recent Results (from the past 2160 hour(s))  POCT  HgB A1C     Status: Abnormal   Collection Time: 02/09/20 10:47 AM  Result Value Ref Range   Hemoglobin A1C 7.2 (A) 4.0 - 5.6 %   HbA1c POC (<> result, manual entry)     HbA1c, POC (prediabetic range)     HbA1c, POC (controlled diabetic range)        PHQ2/9: Depression screen [facility] 2/9 02/09/2020 10/01/2019 07/27/2019 07/27/2019 03/23/2019  Decreased Interest 0 0 0 0 0  Down, Depressed, Hopeless 0 0 1 0 0  PHQ - 2 Score 0 0 1 0 0  Altered sleeping 0 0 0 - 0  Tired, decreased energy 0 0 1 - 0  Change in appetite 2 0 0 - 0  Feeling bad or failure about yourself  0 0 0 - 0  Trouble concentrating 0 0 0 - 0  Moving slowly or fidgety/restless 0 0 0 - 0  Suicidal thoughts 0 0 0 - 0  PHQ-9 Score 2 0 2 - 0  Difficult doing work/chores Not difficult at all - Not difficult at all - -  Some recent data might be hidden    phq 9 is negative   Fall Risk: Fall Risk  02/09/2020 10/01/2019 07/27/2019 03/23/2019 11/20/2018  Falls in the past year? 0 0 0 0 0  Number falls in past yr: 0 0 0 0 0  Injury with Fall? 0 0 0 0 0  Risk for fall due to : - - No Fall Risks - -  Follow up - - Falls prevention discussed - -     Functional Status Survey: Is the patient deaf or have difficulty hearing?: No Does the patient have difficulty seeing, even when wearing glasses/contacts?: No Does the patient have difficulty concentrating, remembering, or making decisions?: No Does the patient have difficulty walking or climbing stairs?: No Does the patient have difficulty dressing or bathing?: No Does the patient have difficulty doing errands alone such as visiting a doctor's office or shopping?: No    Assessment & Plan   1. Type 2 diabetes with nephropathy (HCC)  - POCT HgB A1C  2. Atherosclerosis of aorta (HCC)  Increase frequency of taking statin therapy   3. Senile purpura (HCC)  Both arms  4. Benign essential HTN  At goal   5. B12 deficiency  Takes metformin, advised to switch to sub-lingual    6. Hx-TIA (transient ischemic attack)  On statin and aspirin   7. Kidney stone on right side  Has a follow up with urologist next month

## 2020-02-09 NOTE — Addendum Note (Signed)
Addended by: Steele Sizer F on: 02/09/2020 11:09 AM   Modules accepted: Orders

## 2020-02-10 LAB — COMPLETE METABOLIC PANEL WITH GFR
AG Ratio: 1.3 (calc) (ref 1.0–2.5)
ALT: 11 U/L (ref 6–29)
AST: 13 U/L (ref 10–35)
Albumin: 4 g/dL (ref 3.6–5.1)
Alkaline phosphatase (APISO): 70 U/L (ref 37–153)
BUN/Creatinine Ratio: 17 (calc) (ref 6–22)
BUN: 16 mg/dL (ref 7–25)
CO2: 26 mmol/L (ref 20–32)
Calcium: 9.9 mg/dL (ref 8.6–10.4)
Chloride: 104 mmol/L (ref 98–110)
Creat: 0.94 mg/dL — ABNORMAL HIGH (ref 0.60–0.93)
GFR, Est African American: 69 mL/min/{1.73_m2} (ref >=60)
GFR, Est Non African American: 59 mL/min/{1.73_m2} — ABNORMAL LOW (ref >=60)
Globulin: 3 g/dL (calc) (ref 1.9–3.7)
Glucose, Bld: 74 mg/dL (ref 65–99)
Potassium: 4.3 mmol/L (ref 3.5–5.3)
Sodium: 140 mmol/L (ref 135–146)
Total Bilirubin: 0.5 mg/dL (ref 0.2–1.2)
Total Protein: 7 g/dL (ref 6.1–8.1)

## 2020-02-10 LAB — VITAMIN B12: Vitamin B-12: 738 pg/mL (ref 200–1100)

## 2020-02-10 LAB — CBC WITH DIFFERENTIAL/PLATELET
Absolute Monocytes: 585 cells/uL (ref 200–950)
Basophils Absolute: 54 cells/uL (ref 0–200)
Basophils Relative: 0.6 %
Eosinophils Absolute: 153 cells/uL (ref 15–500)
Eosinophils Relative: 1.7 %
HCT: 38.4 % (ref 35.0–45.0)
Hemoglobin: 12.4 g/dL (ref 11.7–15.5)
Lymphs Abs: 2268 cells/uL (ref 850–3900)
MCH: 27.1 pg (ref 27.0–33.0)
MCHC: 32.3 g/dL (ref 32.0–36.0)
MCV: 84 fL (ref 80.0–100.0)
MPV: 10.3 fL (ref 7.5–12.5)
Monocytes Relative: 6.5 %
Neutro Abs: 5940 cells/uL (ref 1500–7800)
Neutrophils Relative %: 66 %
Platelets: 299 10*3/uL (ref 140–400)
RBC: 4.57 10*6/uL (ref 3.80–5.10)
RDW: 13.5 % (ref 11.0–15.0)
Total Lymphocyte: 25.2 %
WBC: 9 10*3/uL (ref 3.8–10.8)

## 2020-02-10 LAB — LIPID PANEL
Cholesterol: 152 mg/dL (ref <200)
HDL: 57 mg/dL (ref >=50)
LDL Cholesterol (Calc): 76 mg/dL (calc)
Non-HDL Cholesterol (Calc): 95 mg/dL (calc) (ref <130)
Total CHOL/HDL Ratio: 2.7 (calc) (ref <5.0)
Triglycerides: 104 mg/dL (ref <150)

## 2020-02-10 LAB — MICROALBUMIN / CREATININE URINE RATIO
Creatinine, Urine: 165 mg/dL (ref 20–275)
Microalb Creat Ratio: 2 mcg/mg creat (ref <30)
Microalb, Ur: 0.4 mg/dL

## 2020-03-16 ENCOUNTER — Ambulatory Visit: Payer: Medicare HMO | Admitting: Urology

## 2020-03-16 DIAGNOSIS — Z20822 Contact with and (suspected) exposure to covid-19: Secondary | ICD-10-CM | POA: Diagnosis not present

## 2020-03-16 DIAGNOSIS — J069 Acute upper respiratory infection, unspecified: Secondary | ICD-10-CM | POA: Diagnosis not present

## 2020-03-22 ENCOUNTER — Ambulatory Visit: Payer: Self-pay | Admitting: *Deleted

## 2020-03-22 ENCOUNTER — Telehealth: Payer: Medicare HMO

## 2020-03-22 ENCOUNTER — Telehealth: Payer: Self-pay | Admitting: Family Medicine

## 2020-03-22 ENCOUNTER — Ambulatory Visit: Payer: Self-pay

## 2020-03-22 NOTE — Telephone Encounter (Signed)
Copied from [place] [id number]. Topic: General - Other >> Mar 22, 2020 11:55 AM Celene Kras wrote: Reason for CRM: Pt called and is requesting to cancel her appt with the pharmacist for today. She states that she has Covid and that she does not feel up to the appt. Please advise.

## 2020-03-22 NOTE — Telephone Encounter (Signed)
Patient is calling to report she was diagnoses with COVID- she has questions about the infusion clinic and she also would like to talk to PCP about her symptoms. Patient had a couple of good days- but she is now feeling bad again. Patient states she has fever spikes at night, she has cough that has made her chest sore, she is very weak- and she is nauseous. The nausea is making it hard for her to eat. Patient lives alone and is taking care of herself. Patient is currently using ibuprofen and cough syrup for her symptoms. Advised office is closed for lunch- unable to reach in to see if they can do virtual visit with her today- advised would send note high priority- but she may be advised to return to UC for reevaluation.  Reason for Disposition . HIGH RISK for severe COVID complications (e.g., age > 24 years, obesity with BMI > 25, pregnant, chronic lung disease or other chronic medical condition)  (Exception: Already seen by PCP and no new or worsening symptoms.)  Answer Assessment - Initial Assessment Questions 1. COVID-19 DIAGNOSIS: "Who made your Coronavirus (COVID-19) diagnosis?" "Was it confirmed by a positive lab test?" If not diagnosed by a HCP, ask "Are there lots of cases (community spread) where you live?" (See public health department website, if unsure)     Patient was seen at [facility]- Thursday tested- resulted Monday this week 2. COVID-19 EXPOSURE: "Was there any known exposure to COVID before the symptoms began?" CDC Definition of close contact: within 6 feet (2 meters) for a total of 15 minutes or more over a 24-hour period.      Grandson exposed patient 3. ONSET: "When did the COVID-19 symptoms start?"      Last Wednesday night- 9/8 4. WORST SYMPTOM: "What is your worst symptom?" (e.g., cough, fever, shortness of breath, muscle aches)     Weakness- sore from coughing 5. COUGH: "Do you have a cough?" If Yes, ask: "How bad is the cough?"       Yes- still dry cough- finally starting to produce  phlem 6. FEVER: "Do you have a fever?" If Yes, ask: "What is your temperature, how was it measured, and when did it start?"     Evenings at 100- has not checked today 7. RESPIRATORY STATUS: "Describe your breathing?" (e.g., shortness of breath, wheezing, unable to speak)      Patient states she gets tired- but not SOB 8. BETTER-SAME-WORSE: "Are you getting better, staying the same or getting worse compared to yesterday?"  If getting worse, ask, "In what way?"     Worse- weakness 9. HIGH RISK DISEASE: "Do you have any chronic medical problems?" (e.g., asthma, heart or lung disease, weak immune system, obesity, etc.)     Yes- age, diabetic  21. PREGNANCY: "Is there any chance you are pregnant?" "When was your last menstrual period?"       n/a 11. OTHER SYMPTOMS: "Do you have any other symptoms?"  (e.g., chills, fatigue, headache, loss of smell or taste, muscle pain, sore throat; new loss of smell or taste especially support the diagnosis of COVID-19)       Chills, fatigue, no taste/smell  Protocols used: CORONAVIRUS (COVID-19) DIAGNOSED OR SUSPECTED-A-AH

## 2020-03-22 NOTE — Telephone Encounter (Signed)
Pt called stating that she has Covid. She states that she has gotten better, but then she states that it has gotten worse again. She states that she is hardly able to move from the bed and is requesting to have advice. Please advise.   Attempted to call patient- left message to call back

## 2020-03-22 NOTE — Progress Notes (Signed)
Name: Christine Roberts   MRN: [id number]    DOB: Jun 03, 1946   Date:03/23/2020       Progress Note  Subjective  Chief Complaint  Chief Complaint  Patient presents with   Covid Exposure    She feels worse when she gets nauseaed and when her temperature spikes. She is dizzy at times. She has back pain, abdominal pain and chest pain. She called to have an infusion but has not heard anything back.    HPI  COVID-19: she states her symptoms started last week on 03/15/2020, she went to local Urgent Care on 03/16/2020 [facility], rapid test was negative but PCR test was positive. Exposure date was 03/12/2020 by her grandson that is 49 yo and did not get his vaccine ( he lives with her) . She has been vaccinated for COVID-19 - last vaccine 08/05/2019  She states she developed high fever, cough, fatigue, that was initially dry but now is productive, she has lack of appetite, feeling nauseated , lack of smell and taste. She denies sob, but is feeling dizzy and weak. She had a few episodes and vomiting but no diarrhea. She has been able to drink Body Armour and Elder Berry syrup  Glucose has been below 100, once it went to 54 - but only once , continue [place] but stop Metformin until feeling better, okay to keep fasting glucose around 140  Urgent Care gave her cough medication only  She has morbid obesity, DM and age over 45. We will see if she can have an infusion since not improving.   Patient Active Problem List   Diagnosis Date Noted   Morbid obesity ([place]) 07/21/2018   Atherosclerosis of aorta ([place]) 05/25/2018   Seasonal allergic rhinitis 09/12/2015   TIA (transient ischemic attack) 05/24/2015   Senile purpura ([place]) 05/09/2015   Benign essential HTN 03/18/2015   Pain in shoulder 03/18/2015   Dyslipidemia 03/18/2015   History of surgery to major organs, presenting hazards to health 03/18/2015   Hearing loss 03/18/2015   Hiatal hernia 03/18/2015   History  of colon polyps 03/18/2015   Calculus of kidney 03/18/2015   B12 deficiency 03/18/2015   Microalbuminuria 03/18/2015   Adult BMI 30+ 03/18/2015   Arthritis, degenerative 03/18/2015   Diabetes mellitus with nephropathy ([place]) 03/18/2015    Past Surgical History:  Procedure Laterality Date   ABDOMINAL HYSTERECTOMY     CHOLECYSTECTOMY     COLONOSCOPY  2015   COLONOSCOPY WITH PROPOFOL N/A 03/04/2018   Procedure: COLONOSCOPY WITH PROPOFOL;  Surgeon: Toledo, Benay Pike, MD;  Location: [facility] ENDOSCOPY;  Service: Gastroenterology;  Laterality: N/A;   cystoscopic     stent placed in ureter   LITHOTRIPSY  12/2007   MYRINGOTOMY Right 11/2009    Family History  Problem Relation Age of Onset   Cancer Mother    Diabetes Mother    Diabetes Daughter    CAD Father    Diabetes Daughter        hypoglycemia    Social History   Tobacco Use   Smoking status: Never Smoker   Smokeless tobacco: Never Used   Tobacco comment: smoking cessation materials not required  Substance Use Topics   Alcohol use: Yes    Alcohol/week: 1.0 standard drink    Types: 1 Shots of liquor per week    Comment: socially     Current Outpatient Medications:    ACCU-CHEK SOFTCLIX LANCETS lancets, 1 each by Other route daily., Disp:  100 each, Rfl: 1   aspirin EC 81 MG tablet, Take 1 tablet (81 mg total) by mouth daily., Disp: 90 tablet, Rfl: 0   atorvastatin (LIPITOR) 40 MG tablet, Take 1 tablet (40 mg total) by mouth 2 (two) times a week., Disp: 24 tablet, Rfl: 1   Blood Glucose Calibration (TRUE METRIX LEVEL 1) Low SOLN, 1 each by In Vitro route daily., Disp: 1 each, Rfl: 3   blood glucose meter kit and supplies KIT, Dispense based on patient and insurance preference. Use up to four times daily as directed. (FOR ICD-9 250.00, 250.01)., Disp: 1 each, Rfl: 0   calcium citrate (CALCITRATE - DOSED IN MG ELEMENTAL CALCIUM) 950 MG tablet, Take 200 mg of elemental calcium by mouth daily. Has D3 in  it as well, Disp: , Rfl:    Coenzyme Q10 (COQ10) 100 MG CAPS, Take 1 capsule by mouth daily., Disp: , Rfl:    fluticasone (FLONASE) 50 MCG/ACT nasal spray, Place 2 sprays into both nostrils daily., Disp: 16 g, Rfl: 6   glucose blood (ACCU-CHEK AVIVA PLUS) test strip, TEST BLOOD SUGAR UP TO FOUR TIMES DAILY AS DIRECTED, Disp: 400 each, Rfl: 0   Insulin Degludec (TRESIBA FLEXTOUCH) 200 UNIT/ML SOPN, Inject 50 Units into the skin daily. (Patient taking differently: Inject 50 Units into the skin daily. Pt taking 34-36 units daily), Disp: 27 mL, Rfl: 0   Insulin Pen Needle (NOVOFINE) 30G X 8 MM MISC, Inject 10 each into the skin as needed., Disp: 100 each, Rfl: 2   Lancet Devices MISC, 1 Units by Does not apply route 2 (two) times daily., Disp: 100 each, Rfl: 3   lisinopril (ZESTRIL) 20 MG tablet, TAKE 1 TABLET EVERY DAY, Disp: 90 tablet, Rfl: 0   metFORMIN (GLUCOPHAGE-XR) 750 MG 24 hr tablet, Take 1 tablet (750 mg total) by mouth daily with breakfast., Disp: 180 tablet, Rfl: 1   polyethylene glycol powder (GLYCOLAX/MIRALAX) powder, Take 17 g by mouth 2 (two) times daily as needed., Disp: 3350 g, Rfl: 1   Semaglutide,0.25 or 0.5MG/DOS, (OZEMPIC, 0.25 OR 0.5 MG/DOSE,) 2 MG/1.5ML SOPN, Inject 0.375 mLs (0.5 mg total) into the skin once a week., Disp: 6 pen, Rfl: 1   VITAMIN B COMPLEX-C PO, Take by mouth., Disp: , Rfl:    Alcohol Swabs (B-D SINGLE USE SWABS REGULAR) PADS, 1 each by Does not apply route daily., Disp: 400 each, Rfl: 1  Allergies  Allergen Reactions   Latex Rash   Erythromycin Other (See Comments)    Strawberry tongue    I personally reviewed active problem list, medication list, allergies, family history, social history, health maintenance with the patient/caregiver today.   ROS  Ten systems reviewed and is negative except as mentioned in HPI   Objective  Vitals:   03/23/20 0802  Temp: 99 F (37.2 C)  TempSrc: Oral    There is no height or weight on file to  calculate BMI.  Physical Exam  Awake, alert and oriented   Recent Results (from the past 2160 hour(s))  POCT HgB A1C     Status: Abnormal   Collection Time: 02/09/20 10:47 AM  Result Value Ref Range   Hemoglobin A1C 7.2 (A) 4.0 - 5.6 %   HbA1c POC (<> result, manual entry)     HbA1c, POC (prediabetic range)     HbA1c, POC (controlled diabetic range)    CBC with Differential/Platelet     Status: None   Collection Time: 02/09/20 11:17 AM  Result Value  Ref Range   WBC 9.0 3.8 - 10.8 Thousand/uL   RBC 4.57 3.80 - 5.10 Million/uL   Hemoglobin 12.4 11.7 - 15.5 g/dL   HCT 38.4 35 - 45 %   MCV 84.0 80.0 - 100.0 fL   MCH 27.1 27.0 - 33.0 pg   MCHC 32.3 32.0 - 36.0 g/dL   RDW 13.5 11.0 - 15.0 %   Platelets 299 140 - 400 Thousand/uL   MPV 10.3 7.5 - 12.5 fL   Neutro Abs 5,940 1,500 - 7,800 cells/uL   Lymphs Abs 2,268 850 - 3,900 cells/uL   Absolute Monocytes 585 200 - 950 cells/uL   Eosinophils Absolute 153 15 - 500 cells/uL   Basophils Absolute 54 0 - 200 cells/uL   Neutrophils Relative % 66 %   Total Lymphocyte 25.2 %   Monocytes Relative 6.5 %   Eosinophils Relative 1.7 %   Basophils Relative 0.6 %  COMPLETE METABOLIC PANEL WITH GFR     Status: Abnormal   Collection Time: 02/09/20 11:17 AM  Result Value Ref Range   Glucose, Bld 74 65 - 99 mg/dL    Comment: .            Fasting reference interval .    BUN 16 7 - 25 mg/dL   Creat 0.94 (H) 0.60 - 0.93 mg/dL    Comment: For patients >32 years of age, the reference limit for Creatinine is approximately 13% higher for people identified as African-American. .    GFR, Est Non African American 59 (L) > OR = 60 mL/min/1.40m   GFR, Est African American 69 > OR = 60 mL/min/1.730m  BUN/Creatinine Ratio 17 6 - 22 (calc)   Sodium 140 135 - 146 mmol/L   Potassium 4.3 3.5 - 5.3 mmol/L   Chloride 104 98 - 110 mmol/L   CO2 26 20 - 32 mmol/L   Calcium 9.9 8.6 - 10.4 mg/dL   Total Protein 7.0 6.1 - 8.1 g/dL   Albumin 4.0 3.6 - 5.1  g/dL   Globulin 3.0 1.9 - 3.7 g/dL (calc)   AG Ratio 1.3 1.0 - 2.5 (calc)   Total Bilirubin 0.5 0.2 - 1.2 mg/dL   Alkaline phosphatase (APISO) 70 37 - 153 U/L   AST 13 10 - 35 U/L   ALT 11 6 - 29 U/L  Lipid panel     Status: None   Collection Time: 02/09/20 11:17 AM  Result Value Ref Range   Cholesterol 152 <200 mg/dL   HDL 57 > OR = 50 mg/dL   Triglycerides 104 <150 mg/dL   LDL Cholesterol (Calc) 76 mg/dL (calc)    Comment: Reference range: <100 . Desirable range <100 mg/dL for primary prevention;   <70 mg/dL for patients with CHD or diabetic patients  with > or = 2 CHD risk factors. . Marland KitchenDL-C is now calculated using the Martin-Hopkins  calculation, which is a validated novel method providing  better accuracy than the Friedewald equation in the  estimation of LDL-C.  MaCresenciano Genret al. JAAnnamaria [id number];[id number] [id number]  (http://education.[facility].com/faq/FAQ164)    Total CHOL/HDL Ratio 2.7 <5.0 (calc)   Non-HDL Cholesterol (Calc) 95 <130 mg/dL (calc)    Comment: For patients with diabetes plus 1 major ASCVD risk  factor, treating to a non-HDL-C goal of <100 mg/dL  (LDL-C of <70 mg/dL) is considered a therapeutic  option.   Microalbumin / creatinine urine ratio     Status: None   Collection Time: 02/09/20 11:17 AM  Result  Value Ref Range   Creatinine, Urine 165 20 - 275 mg/dL   Microalb, Ur 0.4 mg/dL    Comment: Reference Range Not established    Microalb Creat Ratio 2 <30 mcg/mg creat    Comment: . The ADA defines abnormalities in albumin excretion as follows: Marland Kitchen Category         Result (mcg/mg creatinine) . Normal                    <30 Microalbuminuria         30-299  Clinical albuminuria   > OR = 300 . The ADA recommends that at least two of three specimens collected within a 3-6 month period be abnormal before considering a patient to be within a diagnostic category.   Vitamin B12     Status: None   Collection Time: 02/09/20 11:17 AM  Result Value Ref  Range   Vitamin B-12 738 200 - 1,100 pg/mL     PHQ2/9: Depression screen [facility] 2/9 03/23/2020 02/09/2020 10/01/2019 07/27/2019 07/27/2019  Decreased Interest 0 0 0 0 0  Down, Depressed, Hopeless 0 0 0 1 0  PHQ - 2 Score 0 0 0 1 0  Altered sleeping - 0 0 0 -  Tired, decreased energy - 0 0 1 -  Change in appetite - 2 0 0 -  Feeling bad or failure about yourself  - 0 0 0 -  Trouble concentrating - 0 0 0 -  Moving slowly or fidgety/restless - 0 0 0 -  Suicidal thoughts - 0 0 0 -  PHQ-9 Score - 2 0 2 -  Difficult doing work/chores - Not difficult at all - Not difficult at all -  Some recent data might be hidden    phq 9 is negative   Fall Risk: Fall Risk  03/23/2020 02/09/2020 10/01/2019 07/27/2019 03/23/2019  Falls in the past year? 0 0 0 0 0  Number falls in past yr: 0 0 0 0 0  Injury with Fall? 0 0 0 0 0  Risk for fall due to : - - - No Fall Risks -  Follow up - - - Falls prevention discussed -     Functional Status Survey: Is the patient deaf or have difficulty hearing?: No Does the patient have difficulty seeing, even when wearing glasses/contacts?: No Does the patient have difficulty concentrating, remembering, or making decisions?: No Does the patient have difficulty walking or climbing stairs?: No Does the patient have difficulty dressing or bathing?: No Does the patient have difficulty doing errands alone such as visiting a doctor's office or shopping?: No   Assessment & Plan  1. COVID-19 virus infection  - chlorpheniramine-HYDROcodone (TUSSIONEX PENNKINETIC ER) 10-8 MG/5ML SUER; Take 5 mLs by mouth every 12 (twelve) hours as needed.  Dispense: 140 mL; Refill: 0  2. Cough  - chlorpheniramine-HYDROcodone (TUSSIONEX PENNKINETIC ER) 10-8 MG/5ML SUER; Take 5 mLs by mouth every 12 (twelve) hours as needed.  Dispense: 140 mL; Refill: 0 Discussed getting a pulse ox and monitor it at home, if below 92 go to EC  3. Type 2 diabetes with nephropathy (HCC)  Metformin will be held  until feeling better   4. Morbid obesity ([place])

## 2020-03-23 ENCOUNTER — Telehealth: Payer: Self-pay | Admitting: Unknown Physician Specialty

## 2020-03-23 ENCOUNTER — Telehealth (INDEPENDENT_AMBULATORY_CARE_PROVIDER_SITE_OTHER): Payer: Medicare HMO | Admitting: Family Medicine

## 2020-03-23 ENCOUNTER — Encounter: Payer: Self-pay | Admitting: Family Medicine

## 2020-03-23 ENCOUNTER — Telehealth: Payer: Self-pay

## 2020-03-23 ENCOUNTER — Other Ambulatory Visit: Payer: Self-pay | Admitting: Unknown Physician Specialty

## 2020-03-23 VITALS — Temp 99.0°F

## 2020-03-23 DIAGNOSIS — R05 Cough: Secondary | ICD-10-CM

## 2020-03-23 DIAGNOSIS — E1121 Type 2 diabetes mellitus with diabetic nephropathy: Secondary | ICD-10-CM

## 2020-03-23 DIAGNOSIS — U071 COVID-19: Secondary | ICD-10-CM

## 2020-03-23 DIAGNOSIS — I10 Essential (primary) hypertension: Secondary | ICD-10-CM

## 2020-03-23 DIAGNOSIS — R059 Cough, unspecified: Secondary | ICD-10-CM

## 2020-03-23 MED ORDER — HYDROCOD POLST-CPM POLST ER 10-8 MG/5ML PO SUER: 5.0000 mL | Freq: Two times a day (BID) | ORAL | 0 refills | Status: DC | PRN

## 2020-03-23 NOTE — Telephone Encounter (Signed)
Called left voice mail on infusion hotline to get pt setup

## 2020-03-23 NOTE — Telephone Encounter (Signed)
I connected by phone with Christine Roberts on 03/23/2020 at 12:59 PM to discuss the potential use of a new treatment for mild to moderate COVID-19 viral infection in non-hospitalized patients.  This patient is a 74 y.o. female that meets the FDA criteria for Emergency Use Authorization of COVID monoclonal antibody casirivimab/imdevimab.  Has a (+) direct SARS-CoV-2 viral test result  Has mild or moderate COVID-19   Is NOT hospitalized due to COVID-19  Is within 10 days of symptom onset  Has at least one of the high risk factor(s) for progression to severe COVID-19 and/or hospitalization as defined in EUA.  Specific high risk criteria : Older age (>/= 75 yo), BMI > 25, Diabetes and Cardiovascular disease or hypertension   I have spoken and communicated the following to the patient or parent/caregiver regarding COVID monoclonal antibody treatment:  1. FDA has authorized the emergency use for the treatment of mild to moderate COVID-19 in adults and pediatric patients with positive results of direct SARS-CoV-2 viral testing who are 38 years of age and older weighing at least 40 kg, and who are at high risk for progressing to severe COVID-19 and/or hospitalization.  2. The significant known and potential risks and benefits of COVID monoclonal antibody, and the extent to which such potential risks and benefits are unknown.  3. Information on available alternative treatments and the risks and benefits of those alternatives, including clinical trials.  4. Patients treated with COVID monoclonal antibody should continue to self-isolate and use infection control measures (e.g., wear mask, isolate, social distance, avoid sharing personal items, clean and disinfect "high touch" surfaces, and frequent handwashing) according to CDC guidelines.   5. The patient or parent/caregiver has the option to accept or refuse COVID monoclonal antibody treatment.  After reviewing this information with the patient, The  patient agreed to proceed with receiving casirivimab\imdevimab infusion and will be provided a copy of the Fact sheet prior to receiving the infusion. Kathrine Haddock 03/23/2020 12:59 PM  Sx onset 9/08

## 2020-03-23 NOTE — Telephone Encounter (Signed)
I have already scheduled her for a virtual for 8 40 this morning

## 2020-03-24 ENCOUNTER — Ambulatory Visit (HOSPITAL_COMMUNITY): Payer: Medicare HMO

## 2020-03-24 DIAGNOSIS — U071 COVID-19: Secondary | ICD-10-CM | POA: Diagnosis not present

## 2020-03-27 DIAGNOSIS — U071 COVID-19: Secondary | ICD-10-CM | POA: Diagnosis not present

## 2020-03-29 ENCOUNTER — Ambulatory Visit: Payer: Medicare HMO | Admitting: Urology

## 2020-04-12 ENCOUNTER — Ambulatory Visit
Admission: RE | Admit: 2020-04-12 | Discharge: 2020-04-12 | Disposition: A | Discharge status: Home / Self Care | Payer: Medicare HMO | Source: Ambulatory Visit | Attending: Urology | Admitting: Urology

## 2020-04-12 ENCOUNTER — Other Ambulatory Visit: Payer: Self-pay

## 2020-04-12 ENCOUNTER — Ambulatory Visit
Admission: RE | Admit: 2020-04-12 | Discharge: 2020-04-12 | Disposition: A | Discharge status: Home / Self Care | Payer: Medicare HMO | Attending: Urology | Admitting: Urology

## 2020-04-12 ENCOUNTER — Encounter: Payer: Self-pay | Admitting: Urology

## 2020-04-12 ENCOUNTER — Ambulatory Visit: Payer: Medicare HMO | Admitting: Urology

## 2020-04-12 VITALS — BP 123/81 | HR 85 | Ht 64.0 in | Wt 193.0 lb

## 2020-04-12 DIAGNOSIS — N200 Calculus of kidney: Secondary | ICD-10-CM | POA: Insufficient documentation

## 2020-04-12 DIAGNOSIS — Z87442 Personal history of urinary calculi: Secondary | ICD-10-CM | POA: Diagnosis not present

## 2020-04-12 NOTE — Progress Notes (Signed)
   04/12/2020 11:39 AM   Christine Roberts July 03, 1946 [id number]  Reason for visit: Follow up nephrolithiasis  HPI: I saw [name] back in urology clinic for follow-up of nephrolithiasis with a small 2 mm right renal stone on active surveillance.  She has a history of nephrolithiasis, and is undergone lithotripsy previously, as well as ureteroscopy with Dr. Bernardo Heater in 2015.  She did not tolerate her stent well.  She occasionally has some right lower back pain that sounds more musculoskeletal in nature.  She denies any renal colic or gross hematuria.  She is recovering from Covid currently.  I personally reviewed her KUB today that shows no change in her punctate 2 mm right midpole stone.    We discussed general stone prevention strategies including adequate hydration with goal of producing 2.5 L of urine daily, increasing citric acid intake, increasing calcium intake during high oxalate meals, minimizing animal protein, and decreasing salt intake. Information about dietary recommendations given today.   RTC 1 year with KUB prior  Billey Co, MD  [facility] [address], [place], [place] [address] [phone]

## 2020-04-12 NOTE — Patient Instructions (Signed)
Dietary Guidelines to Help Prevent Kidney Stones Kidney stones are deposits of minerals and salts that form inside your kidneys. Your risk of developing kidney stones may be greater depending on your diet, your lifestyle, the medicines you take, and whether you have certain medical conditions. Most people can reduce their chances of developing kidney stones by following the instructions below. Depending on your overall health and the type of kidney stones you tend to develop, your dietitian may give you more specific instructions. What are tips for following this plan? Reading food labels  Choose foods with "no salt added" or "low-salt" labels. Limit your sodium intake to less than 1500 mg per day.  Choose foods with calcium for each meal and snack. Try to eat about 300 mg of calcium at each meal. Foods that contain 200-500 mg of calcium per serving include: ? 8 oz (237 ml) of milk, fortified nondairy milk, and fortified fruit juice. ? 8 oz (237 ml) of kefir, yogurt, and soy yogurt. ? 4 oz (118 ml) of tofu. ? 1 oz of cheese. ? 1 cup (300 g) of dried figs. ? 1 cup (91 g) of cooked broccoli. ? 1-3 oz can of sardines or mackerel.  Most people need 1000 to 1500 mg of calcium each day. Talk to your dietitian about how much calcium is recommended for you. Shopping  Buy plenty of fresh fruits and vegetables. Most people do not need to avoid fruits and vegetables, even if they contain nutrients that may contribute to kidney stones.  When shopping for convenience foods, choose: ? Whole pieces of fruit. ? Premade salads with dressing on the side. ? Low-fat fruit and yogurt smoothies.  Avoid buying frozen meals or prepared deli foods.  Look for foods with live cultures, such as yogurt and kefir. Cooking  Do not add salt to food when cooking. Place a salt shaker on the table and allow each person to add his or her own salt to taste.  Use vegetable protein, such as beans, textured vegetable  protein (TVP), or tofu instead of meat in pasta, casseroles, and soups. Meal planning   Eat less salt, if told by your dietitian. To do this: ? Avoid eating processed or premade food. ? Avoid eating fast food.  Eat less animal protein, including cheese, meat, poultry, or fish, if told by your dietitian. To do this: ? Limit the number of times you have meat, poultry, fish, or cheese each week. Eat a diet free of meat at least 2 days a week. ? Eat only one serving each day of meat, poultry, fish, or seafood. ? When you prepare animal protein, cut pieces into small portion sizes. For most meat and fish, one serving is about the size of one deck of cards.  Eat at least 5 servings of fresh fruits and vegetables each day. To do this: ? Keep fruits and vegetables on hand for snacks. ? Eat 1 piece of fruit or a handful of berries with breakfast. ? Have a salad and fruit at lunch. ? Have two kinds of vegetables at dinner.  Limit foods that are high in a substance called oxalate. These include: ? Spinach. ? Rhubarb. ? Beets. ? Potato chips and french fries. ? Nuts.  If you regularly take a diuretic medicine, make sure to eat at least 1-2 fruits or vegetables high in potassium each day. These include: ? Avocado. ? Banana. ? Orange, prune, carrot, or tomato juice. ? Baked potato. ? Cabbage. ? Beans and split   peas. General instructions   Drink enough fluid to keep your urine clear or pale yellow. This is the most important thing you can do.  Talk to your health care provider and dietitian about taking daily supplements. Depending on your health and the cause of your kidney stones, you may be advised: ? Not to take supplements with vitamin C. ? To take a calcium supplement. ? To take a daily probiotic supplement. ? To take other supplements such as magnesium, fish oil, or vitamin B6.  Take all medicines and supplements as told by your health care provider.  Limit alcohol intake to no  more than 1 drink a day for nonpregnant women and 2 drinks a day for men. One drink equals 12 oz of beer, 5 oz of wine, or 1 oz of hard liquor.  Lose weight if told by your health care provider. Work with your dietitian to find strategies and an eating plan that works best for you. What foods are not recommended? Limit your intake of the following foods, or as told by your dietitian. Talk to your dietitian about specific foods you should avoid based on the type of kidney stones and your overall health. Grains Breads. Bagels. Rolls. Baked goods. Salted crackers. Cereal. Pasta. Vegetables Spinach. Rhubarb. Beets. Canned vegetables. Pickles. Olives. Meats and other protein foods Nuts. Nut butters. Large portions of meat, poultry, or fish. Salted or cured meats. Deli meats. Hot dogs. Sausages. Dairy Cheese. Beverages Regular soft drinks. Regular vegetable juice. Seasonings and other foods Seasoning blends with salt. Salad dressings. Canned soups. Soy sauce. Ketchup. Barbecue sauce. Canned pasta sauce. Casseroles. Pizza. Lasagna. Frozen meals. Potato chips. French fries. Summary  You can reduce your risk of kidney stones by making changes to your diet.  The most important thing you can do is drink enough fluid. You should drink enough fluid to keep your urine clear or pale yellow.  Ask your health care provider or dietitian how much protein from animal sources you should eat each day, and also how much salt and calcium you should have each day. This information is not intended to replace advice given to you by your health care provider. Make sure you discuss any questions you have with your health care provider. Document Revised: 10/14/2018 Document Reviewed: 06/04/2016 Elsevier Patient Education  2020 [employer]  

## 2020-04-13 ENCOUNTER — Ambulatory Visit: Payer: Medicare HMO | Admitting: Pharmacist

## 2020-04-13 DIAGNOSIS — E1121 Type 2 diabetes mellitus with diabetic nephropathy: Secondary | ICD-10-CM

## 2020-04-13 DIAGNOSIS — E785 Hyperlipidemia, unspecified: Secondary | ICD-10-CM

## 2020-04-13 NOTE — Chronic Care Management (AMB) (Signed)
Chronic Care Management Pharmacy  Name: Christine Roberts  MRN: [id number] DOB: 07/14/1945  Chief Complaint/ HPI  Christine Roberts,  74 y.o. , female presents for their Follow-Up CCM visit with the clinical pharmacist via telephone due to COVID-19 Pandemic.  PCP : Steele Sizer, MD  Their chronic conditions include: HTN, HLD, DM  Office Visits:NA  Consult Visit:NA  Medications: Outpatient Encounter Medications as of 04/13/2020  Medication Sig  . ACCU-CHEK SOFTCLIX LANCETS lancets 1 each by Other route daily.  . Alcohol Swabs (B-D SINGLE USE SWABS REGULAR) PADS 1 each by Does not apply route daily.  Marland Kitchen aspirin EC 81 MG tablet Take 1 tablet (81 mg total) by mouth daily.  Marland Kitchen atorvastatin (LIPITOR) 40 MG tablet Take 1 tablet (40 mg total) by mouth 2 (two) times a week.  . Blood Glucose Calibration (TRUE METRIX LEVEL 1) Low SOLN 1 each by In Vitro route daily.  . blood glucose meter kit and supplies KIT Dispense based on patient and insurance preference. Use up to four times daily as directed. (FOR ICD-9 250.00, 250.01).  . calcium citrate (CALCITRATE - DOSED IN MG ELEMENTAL CALCIUM) 950 MG tablet Take 200 mg of elemental calcium by mouth daily. Has D3 in it as well  . Coenzyme Q10 (COQ10) 100 MG CAPS Take 1 capsule by mouth daily.  . fluticasone (FLONASE) 50 MCG/ACT nasal spray Place 2 sprays into both nostrils daily.  Marland Kitchen glucose blood (ACCU-CHEK AVIVA PLUS) test strip TEST BLOOD SUGAR UP TO FOUR TIMES DAILY AS DIRECTED  . Insulin Degludec (TRESIBA FLEXTOUCH) 200 UNIT/ML SOPN Inject 50 Units into the skin daily.  . Insulin Pen Needle (NOVOFINE) 30G X 8 MM MISC Inject 10 each into the skin as needed.  Elmore Guise Devices MISC 1 Units by Does not apply route 2 (two) times daily.  Marland Kitchen lisinopril (ZESTRIL) 20 MG tablet TAKE 1 TABLET EVERY DAY  . metFORMIN (GLUCOPHAGE-XR) 750 MG 24 hr tablet Take 1 tablet (750 mg total) by mouth daily with breakfast.  . polyethylene glycol powder (GLYCOLAX/MIRALAX)  powder Take 17 g by mouth 2 (two) times daily as needed.  . Semaglutide,0.25 or 0.5MG/DOS, (OZEMPIC, 0.25 OR 0.5 MG/DOSE,) 2 MG/1.5ML SOPN Inject 0.375 mLs (0.5 mg total) into the skin once a week.  Marland Kitchen VITAMIN B COMPLEX-C PO Take by mouth.   No facility-administered encounter medications on file as of 04/13/2020.     Financial Resource Strain: High Risk  . Difficulty of Paying Living Expenses: Very hard     Current Diagnosis/Assessment:  Goals Addressed   None    Diabetes   Recent Relevant Labs: Lab Results  Component Value Date/Time   HGBA1C 7.2 (A) 02/09/2020 10:47 AM   HGBA1C 8.1 (A) 10/01/2019 10:39 AM   HGBA1C 7.7 (H) 11/23/2018 08:05 AM   HGBA1C 9.1 (A) 07/21/2018 11:48 AM   HGBA1C 8.1 (A) 03/30/2018 04:05 PM   MICROALBUR 0.4 02/09/2020 11:17 AM   MICROALBUR <0.2 11/23/2018 08:05 AM   MICROALBUR 20 01/10/2017 01:39 PM   MICROALBUR 20 09/12/2015 03:42 PM     Checking BG: 2x per Day  Recent pre-meal BG readings: 70, 60, 39 Patient has failed these meds in past: NA Patient is currently uncontrolled on the following medications: Tresiba 30u daily  Last diabetic Foot exam:  Lab Results  Component Value Date/Time   HMDIABEYEEXA No Retinopathy 04/01/2018 12:00 AM    Last diabetic Eye exam: No results found for: HMDIABFOOTEX   We discussed:  Stopped Ozempic, hasn't  taken in a month Lowered Tresiba 30 units daily, was 38 before Covid-19 Stopped metformin  Plan  Continue current medications  Medication Management   We discussed:  Went off Ozempic and [place] during [employer] current medication management strategy  Follow up: 3 month phone visit  Milus Height, PharmD, [place], [facility] [phone]

## 2020-04-19 NOTE — Patient Instructions (Addendum)
Visit Information  Goals Addressed   None     Print copy of patient instructions provided.   Telephone follow up appointment with pharmacy team member scheduled for: 3 months  Burley Kopka, PharmD, BCGP, CTTS Clinical Pharmacist [facility] [phone] Hypoglycemia Hypoglycemia is when the sugar (glucose) level in your blood is too low. Signs of low blood sugar may include:  Feeling: ? Hungry. ? Worried or nervous (anxious). ? Sweaty and clammy. ? Confused. ? Dizzy. ? Sleepy. ? Sick to your stomach (nauseous).  Having: ? A fast heartbeat. ? A headache. ? A change in your vision. ? Tingling or no feeling (numbness) around your mouth, lips, or tongue. ? Jerky movements that you cannot control (seizure).  Having trouble with: ? Moving (coordination). ? Sleeping. ? Passing out (fainting). ? Getting upset easily (irritability). Low blood sugar can happen to people who have diabetes and people who do not have diabetes. Low blood sugar can happen quickly, and it can be an emergency. Treating low blood sugar Low blood sugar is often treated by eating or drinking something sugary right away, such as:  Fruit juice, 4-6 oz (120-150 mL).  Regular soda (not diet soda), 4-6 oz (120-150 mL).  Low-fat milk, 4 oz (120 mL).  Several pieces of hard candy.  Sugar or honey, 1 Tbsp (15 mL). Treating low blood sugar if you have diabetes If you can think clearly and swallow safely, follow the 15:15 rule:  Take 15 grams of a fast-acting carb (carbohydrate). Talk with your doctor about how much you should take.  Always keep a source of fast-acting carb with you, such as: ? Sugar tablets (glucose pills). Take 3-4 pills. ? 6-8 pieces of hard candy. ? 4-6 oz (120-150 mL) of fruit juice. ? 4-6 oz (120-150 mL) of regular (not diet) soda. ? 1 Tbsp (15 mL) honey or sugar.  Check your blood sugar 15 minutes after you take the carb.  If your blood sugar is still at or  below 70 mg/dL (3.9 mmol/L), take 15 grams of a carb again.  If your blood sugar does not go above 70 mg/dL (3.9 mmol/L) after 3 tries, get help right away.  After your blood sugar goes back to normal, eat a meal or a snack within 1 hour.  Treating very low blood sugar If your blood sugar is at or below 54 mg/dL (3 mmol/L), you have very low blood sugar (severe hypoglycemia). This may also cause:  Passing out.  Jerky movements you cannot control (seizure).  Losing consciousness (coma). This is an emergency. Do not wait to see if the symptoms will go away. Get medical help right away. Call your local emergency services (911 in the [place] Do not drive yourself to the hospital. If you have very low blood sugar and you cannot eat or drink, you may need a glucagon shot (injection). A family member or friend should learn how to check your blood sugar and how to give you a glucagon shot. Ask your doctor if you need to have a glucagon shot kit at home. Follow these instructions at home: General instructions  Take over-the-counter and prescription medicines only as told by your doctor.  Stay aware of your blood sugar as told by your doctor.  Limit alcohol intake to no more than 1 drink a day for nonpregnant women and 2 drinks a day for men. One drink equals 12 oz of beer (355 mL), 5 oz of wine (148 mL), or 1 oz of   hard liquor (44 mL).  Keep all follow-up visits as told by your doctor. This is important. If you have diabetes:   Follow your diabetes care plan as told by your doctor. Make sure you: ? Know the signs of low blood sugar. ? Take your medicines as told. ? Follow your exercise and meal plan. ? Eat on time. Do not skip meals. ? Check your blood sugar as often as told by your doctor. Always check it before and after exercise. ? Follow your sick day plan when you cannot eat or drink normally. Make this plan ahead of time with your doctor.  Share your diabetes care plan with: ? Your  work or school. ? People you live with.  Check your pee (urine) for ketones: ? When you are sick. ? As told by your doctor.  Carry a card or wear jewelry that says you have diabetes. Contact a doctor if:  You have trouble keeping your blood sugar in your target range.  You have low blood sugar often. Get help right away if:  You still have symptoms after you eat or drink something sugary.  Your blood sugar is at or below 54 mg/dL (3 mmol/L).  You have jerky movements that you cannot control.  You pass out. These symptoms may be an emergency. Do not wait to see if the symptoms will go away. Get medical help right away. Call your local emergency services (911 in the [place] Do not drive yourself to the hospital. Summary  Hypoglycemia happens when the level of sugar (glucose) in your blood is too low.  Low blood sugar can happen to people who have diabetes and people who do not have diabetes. Low blood sugar can happen quickly, and it can be an emergency.  Make sure you know the signs of low blood sugar and know how to treat it.  Always keep a source of sugar (fast-acting carb) with you to treat low blood sugar. This information is not intended to replace advice given to you by your health care provider. Make sure you discuss any questions you have with your health care provider. Document Revised: 10/15/2018 Document Reviewed: 07/28/2015 Elsevier Patient Education  2020 Elsevier Inc.  

## 2020-05-03 ENCOUNTER — Other Ambulatory Visit: Payer: Self-pay | Admitting: Family Medicine

## 2020-05-03 DIAGNOSIS — E785 Hyperlipidemia, unspecified: Secondary | ICD-10-CM

## 2020-05-11 ENCOUNTER — Telehealth: Payer: Self-pay

## 2020-05-11 NOTE — Progress Notes (Signed)
Chronic Care Management Pharmacy Assistant   Name: Christine Roberts  MRN: [id number] DOB: [birth year]  Reason for Encounter: Medication Review  Patient Questions:  1.  Have you seen any other providers since your last visit? No  2.  Any changes in your medicines or health? No   Christine Roberts,  74 y.o. , female presents for their Follow-Up CCM visit with the clinical pharmacist via telephone.  PCP : Steele Sizer, MD  Allergies:   Allergies  Allergen Reactions  . Latex Rash  . Erythromycin Other (See Comments)    Strawberry tongue    Medications: Outpatient Encounter Medications as of 05/11/2020  Medication Sig  . ACCU-CHEK SOFTCLIX LANCETS lancets 1 each by Other route daily.  . Alcohol Swabs (B-D SINGLE USE SWABS REGULAR) PADS 1 each by Does not apply route daily.  Marland Kitchen aspirin EC 81 MG tablet Take 1 tablet (81 mg total) by mouth daily.  Marland Kitchen atorvastatin (LIPITOR) 40 MG tablet TAKE 1 TABLET 2 TIMES A WEEK.  . Blood Glucose Calibration (TRUE METRIX LEVEL 1) Low SOLN 1 each by In Vitro route daily.  . blood glucose meter kit and supplies KIT Dispense based on patient and insurance preference. Use up to four times daily as directed. (FOR ICD-9 250.00, 250.01).  . calcium citrate (CALCITRATE - DOSED IN MG ELEMENTAL CALCIUM) 950 MG tablet Take 200 mg of elemental calcium by mouth daily. Has D3 in it as well  . Coenzyme Q10 (COQ10) 100 MG CAPS Take 1 capsule by mouth daily.  . fluticasone (FLONASE) 50 MCG/ACT nasal spray Place 2 sprays into both nostrils daily.  Marland Kitchen glucose blood (ACCU-CHEK AVIVA PLUS) test strip TEST BLOOD SUGAR UP TO FOUR TIMES DAILY AS DIRECTED  . Insulin Degludec (TRESIBA FLEXTOUCH) 200 UNIT/ML SOPN Inject 50 Units into the skin daily. (Patient taking differently: Inject 30 Units into the skin daily. )  . Insulin Pen Needle (NOVOFINE) 30G X 8 MM MISC Inject 10 each into the skin as needed.  Elmore Guise Devices MISC 1 Units by Does not apply route 2 (two) times daily.  Marland Kitchen  lisinopril (ZESTRIL) 20 MG tablet TAKE 1 TABLET EVERY DAY  . metFORMIN (GLUCOPHAGE-XR) 750 MG 24 hr tablet Take 1 tablet (750 mg total) by mouth daily with breakfast. (Patient not taking: Reported on 04/19/2020)  . polyethylene glycol powder (GLYCOLAX/MIRALAX) powder Take 17 g by mouth 2 (two) times daily as needed.  . Semaglutide,0.25 or 0.5MG/DOS, (OZEMPIC, 0.25 OR 0.5 MG/DOSE,) 2 MG/1.5ML SOPN Inject 0.375 mLs (0.5 mg total) into the skin once a week. (Patient not taking: Reported on 04/19/2020)  . VITAMIN B COMPLEX-C PO Take by mouth.   No facility-administered encounter medications on file as of 05/11/2020.    Current Diagnosis: Patient Active Problem List   Diagnosis Date Noted  . Morbid obesity ([place]) 07/21/2018  . Atherosclerosis of aorta ([place]) 05/25/2018  . Seasonal allergic rhinitis 09/12/2015  . TIA (transient ischemic attack) 05/24/2015  . Senile purpura ([place]) 05/09/2015  . Benign essential HTN 03/18/2015  . Pain in shoulder 03/18/2015  . Dyslipidemia 03/18/2015  . History of surgery to major organs, presenting hazards to health 03/18/2015  . Hearing loss 03/18/2015  . Hiatal hernia 03/18/2015  . History of colon polyps 03/18/2015  . Calculus of kidney 03/18/2015  . B12 deficiency 03/18/2015  . Microalbuminuria 03/18/2015  . Adult BMI 30+ 03/18/2015  . Arthritis, degenerative 03/18/2015  . Diabetes mellitus with nephropathy ([place]) 03/18/2015    Goals Addressed  None     Follow-Up:  Patient Assistance Coordination   30/14/8403- Mailed application for [employer] patient assistance to patient. Patient to complete and return it and any needed supporting documents for Dr Ancil Boozer to provide prescription for application. Patient may contact me if any questions, phone number provided.

## 2020-05-23 ENCOUNTER — Other Ambulatory Visit: Payer: Self-pay | Admitting: Family Medicine

## 2020-05-23 DIAGNOSIS — I10 Essential (primary) hypertension: Secondary | ICD-10-CM

## 2020-05-23 DIAGNOSIS — E1121 Type 2 diabetes mellitus with diabetic nephropathy: Secondary | ICD-10-CM

## 2020-06-12 ENCOUNTER — Ambulatory Visit: Payer: Medicare HMO | Admitting: Family Medicine
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
## 2020-06-13 ENCOUNTER — Ambulatory Visit (INDEPENDENT_AMBULATORY_CARE_PROVIDER_SITE_OTHER): Payer: Medicare HMO | Admitting: Family Medicine

## 2020-06-13 ENCOUNTER — Other Ambulatory Visit: Payer: Self-pay

## 2020-06-13 ENCOUNTER — Encounter: Payer: Self-pay | Admitting: Family Medicine

## 2020-06-13 VITALS — BP 122/76 | HR 79 | Temp 97.7°F | Resp 16 | Ht 64.0 in | Wt 195.3 lb

## 2020-06-13 DIAGNOSIS — M170 Bilateral primary osteoarthritis of knee: Secondary | ICD-10-CM | POA: Diagnosis not present

## 2020-06-13 DIAGNOSIS — E1121 Type 2 diabetes mellitus with diabetic nephropathy: Secondary | ICD-10-CM | POA: Diagnosis not present

## 2020-06-13 DIAGNOSIS — I10 Essential (primary) hypertension: Secondary | ICD-10-CM

## 2020-06-13 DIAGNOSIS — E785 Hyperlipidemia, unspecified: Secondary | ICD-10-CM

## 2020-06-13 DIAGNOSIS — Z8673 Personal history of transient ischemic attack (TIA), and cerebral infarction without residual deficits: Secondary | ICD-10-CM

## 2020-06-13 DIAGNOSIS — I700 Atherosclerosis of aorta: Secondary | ICD-10-CM

## 2020-06-13 DIAGNOSIS — D692 Other nonthrombocytopenic purpura: Secondary | ICD-10-CM | POA: Diagnosis not present

## 2020-06-13 DIAGNOSIS — E538 Deficiency of other specified B group vitamins: Secondary | ICD-10-CM | POA: Diagnosis not present

## 2020-06-13 DIAGNOSIS — Z794 Long term (current) use of insulin: Secondary | ICD-10-CM | POA: Diagnosis not present

## 2020-06-13 DIAGNOSIS — F439 Reaction to severe stress, unspecified: Secondary | ICD-10-CM

## 2020-06-13 LAB — POCT GLYCOSYLATED HEMOGLOBIN (HGB A1C): Hemoglobin A1C: 6.6 % — AB (ref 4.0–5.6)

## 2020-06-13 MED ORDER — LISINOPRIL 10 MG PO TABS: 10.0000 mg | ORAL_TABLET | Freq: Every day | ORAL | 0 refills | Status: DC

## 2020-06-13 MED ORDER — TRESIBA FLEXTOUCH 200 UNIT/ML ~~LOC~~ SOPN: 32.0000 [IU] | PEN_INJECTOR | Freq: Every day | SUBCUTANEOUS | 0 refills | Status: DC

## 2020-06-13 MED ORDER — METFORMIN HCL ER 750 MG PO TB24: 750.0000 mg | ORAL_TABLET | Freq: Every day | ORAL | 1 refills | Status: DC

## 2020-06-14 ENCOUNTER — Other Ambulatory Visit: Payer: Self-pay | Admitting: Family Medicine

## 2020-06-14 DIAGNOSIS — E1121 Type 2 diabetes mellitus with diabetic nephropathy: Secondary | ICD-10-CM

## 2020-06-14 NOTE — Telephone Encounter (Signed)
GFR noted in result note on 02/13/20. Approved per protocol.  Requested Prescriptions  Pending Prescriptions Disp Refills  . metFORMIN (GLUCOPHAGE-XR) 750 MG 24 hr tablet [Pharmacy Med Name: METFORMIN HYDROCHLORIDE ER 750 MG Tablet Extended Release 24 Hour] 90 tablet 1    Sig: TAKE 1 TABLET (750 MG TOTAL) BY MOUTH DAILY WITH BREAKFAST.     Endocrinology:  Diabetes - Biguanides Failed - 06/14/2020  3:56 PM      Failed - Cr in normal range and within 360 days    Creat  Date Value Ref Range Status  02/09/2020 0.94 (H) 0.60 - 0.93 mg/dL Final    Comment:    For patients >55 years of age, the reference limit for Creatinine is approximately 13% higher for people identified as African-American. .    Creatinine, Urine  Date Value Ref Range Status  02/09/2020 165 20 - 275 mg/dL Final         Failed - eGFR in normal range and within 360 days    GFR, Est African American  Date Value Ref Range Status  02/09/2020 69 > OR = 60 mL/min/1.33m Final   GFR, Est Non African American  Date Value Ref Range Status  02/09/2020 59 (L) > OR = 60 mL/min/1.764mFinal         Passed - HBA1C is between 0 and 7.9 and within 180 days    Hemoglobin A1C  Date Value Ref Range Status  06/13/2020 6.6 (A) 4.0 - 5.6 % Final   HbA1c, POC (controlled diabetic range)  Date Value Ref Range Status  07/21/2018 9.1 (A) 0.0 - 7.0 % Final   Hgb A1c MFr Bld  Date Value Ref Range Status  11/23/2018 7.7 (H) <5.7 % of total Hgb Final    Comment:    For someone without known diabetes, a hemoglobin A1c value of 6.5% or greater indicates that they may have  diabetes and this should be confirmed with a follow-up  test. . For someone with known diabetes, a value <7% indicates  that their diabetes is well controlled and a value  greater than or equal to 7% indicates suboptimal  control. A1c targets should be individualized based on  duration of diabetes, age, comorbid conditions, and  other  considerations. . Currently, no consensus exists regarding use of hemoglobin A1c for diagnosis of diabetes for children. . Renella Cunas Valid encounter within last 6 months    Recent Outpatient Visits          [facility]   2 months ago [facility]   4 months [facility]   8 months [facility]   1 year ago Benign essential HTN   [facility]      Future Appointments            [facility] month  [facility]

## 2020-07-24 ENCOUNTER — Other Ambulatory Visit: Payer: Self-pay | Admitting: Family Medicine

## 2020-07-24 DIAGNOSIS — E1121 Type 2 diabetes mellitus with diabetic nephropathy: Secondary | ICD-10-CM

## 2020-07-24 DIAGNOSIS — I10 Essential (primary) hypertension: Secondary | ICD-10-CM

## 2020-08-01 ENCOUNTER — Ambulatory Visit: Payer: Medicare HMO

## 2020-08-01 ENCOUNTER — Telehealth: Payer: Self-pay

## 2020-08-08 ENCOUNTER — Telehealth: Payer: Self-pay

## 2020-08-08 NOTE — Progress Notes (Signed)
Unable to leave a message to  confirmed patient telephone appointment on 08/09/2020 for CCM at 10:00 am with [name] the Clinical pharmacist.   [place] Pharmacist Assistant [phone]

## 2020-08-09 ENCOUNTER — Ambulatory Visit (INDEPENDENT_AMBULATORY_CARE_PROVIDER_SITE_OTHER): Payer: Medicare HMO

## 2020-08-09 DIAGNOSIS — E1121 Type 2 diabetes mellitus with diabetic nephropathy: Secondary | ICD-10-CM

## 2020-08-09 DIAGNOSIS — E785 Hyperlipidemia, unspecified: Secondary | ICD-10-CM

## 2020-08-09 DIAGNOSIS — E1169 Type 2 diabetes mellitus with other specified complication: Secondary | ICD-10-CM

## 2020-08-09 NOTE — Chronic Care Management (AMB) (Signed)
Chronic Care Management Pharmacy  Name: Christine Roberts  MRN: [id number] DOB: January 15, 1946  Chief Complaint/ HPI  Christine Roberts,  75 y.o. , female presents for their Follow-Up CCM visit with the clinical pharmacist via telephone due to COVID-19 Pandemic.  PCP : Christine Sizer, MD  Their chronic conditions include: Hypertension, Hyperlipidemia, Diabetes, Coronary Artery Disease, Osteoarthritis, and History of TIA  Office Visits: 06/13/20: Patient presented to Dr. Ancil Roberts for follow-up. A1c 6.6%. Tresiba decreased to 32 units nightly.   Consult Visit:NA  Medications: Outpatient Encounter Medications as of 08/09/2020  Medication Sig  . insulin degludec (TRESIBA FLEXTOUCH) 200 UNIT/ML FlexTouch Pen Inject 32 Units into the skin daily.  Marland Kitchen lisinopril (ZESTRIL) 10 MG tablet Take 1 tablet (10 mg total) by mouth daily.  . metFORMIN (GLUCOPHAGE-XR) 750 MG 24 hr tablet TAKE 1 TABLET (750 MG TOTAL) BY MOUTH DAILY WITH BREAKFAST.  Marland Kitchen Semaglutide,0.25 or 0.5MG/DOS, (OZEMPIC, 0.25 OR 0.5 MG/DOSE,) 2 MG/1.5ML SOPN Inject 0.375 mLs (0.5 mg total) into the skin once a week.  Marland Kitchen ACCU-CHEK SOFTCLIX LANCETS lancets 1 each by Other route daily.  . Alcohol Swabs (B-D SINGLE USE SWABS REGULAR) PADS 1 each by Does not apply route daily.  Marland Kitchen aspirin EC 81 MG tablet Take 1 tablet (81 mg total) by mouth daily.  Marland Kitchen atorvastatin (LIPITOR) 40 MG tablet TAKE 1 TABLET 2 TIMES A WEEK.  . Blood Glucose Calibration (TRUE METRIX LEVEL 1) Low SOLN 1 each by In Vitro route daily.  . blood glucose meter kit and supplies KIT Dispense based on patient and insurance preference. Use up to four times daily as directed. (FOR ICD-9 250.00, 250.01).  . calcium citrate (CALCITRATE - DOSED IN MG ELEMENTAL CALCIUM) 950 MG tablet Take 200 mg of elemental calcium by mouth daily. Has D3 in it as well  . Coenzyme Q10 (COQ10) 100 MG CAPS Take 1 capsule by mouth daily.  . fluticasone (FLONASE) 50 MCG/ACT nasal spray Place 2 sprays into both nostrils  daily.  Marland Kitchen glucose blood (ACCU-CHEK AVIVA PLUS) test strip TEST BLOOD SUGAR UP TO FOUR TIMES DAILY AS DIRECTED  . Insulin Pen Needle (NOVOFINE) 30G X 8 MM MISC Inject 10 each into the skin as needed.  Elmore Guise Devices MISC 1 Units by Does not apply route 2 (two) times daily.  . polyethylene glycol powder (GLYCOLAX/MIRALAX) powder Take 17 g by mouth 2 (two) times daily as needed.  Marland Kitchen VITAMIN B COMPLEX-C PO Take by mouth.   No facility-administered encounter medications on file as of 08/09/2020.   Current Diagnosis/Assessment:  SDOH Interventions   Flowsheet Row Most Recent Value  SDOH Interventions   Financial Strain Interventions Intervention Not Indicated  Transportation Interventions Intervention Not Indicated     Goals Addressed              This Visit's Progress   .  Chronic Care Management (pt-stated)        CARE PLAN ENTRY (see longitudinal plan of care for additional care plan information)  Current Barriers:  . Chronic Disease Management support, education, and care coordination needs related to Hypertension, Hyperlipidemia, Diabetes, Coronary Artery Disease, Osteoarthritis, and History of TIA   Hypertension BP Readings from Last 3 Encounters:  06/13/20 122/76  04/12/20 123/81  02/09/20 126/80   . Pharmacist Clinical Goal(s): o Over the next 90 days, patient will work with PharmD and providers to maintain BP goal <140/90 . Current regimen:  o Lisinopril 10 mg daily   . Interventions: o Discussed  low salt diet and exercising as tolerated extensively o Will initiate blood pressure monitoring plan  . Patient self care activities - Over the next 90 days, patient will: o Check blood pressure Weekly, document, and provide at future appointments o Ensure daily salt intake < 2300 mg/day  Hyperlipidemia Lab Results  Component Value Date/Time   LDLCALC 76 02/09/2020 11:17 AM   . Pharmacist Clinical Goal(s): o Over the next 90 days, patient will work with PharmD and  providers to achieve LDL goal < 70 . Current regimen:  o Atorvastatin 40 mg 2-3 times weekly . Interventions: o Discussed low cholesterol diet and exercising as tolerated extensively o Will initiate cholesterol monitoring plan  . Patient self care activities - Over the next 90 days, patient will: o Continue taking CoQ10 to minimize cramping/ muscle pain  o Take atorvastatin as often as tolerated  Diabetes Lab Results  Component Value Date/Time   HGBA1C 6.6 (A) 06/13/2020 01:20 PM   HGBA1C 7.2 (A) 02/09/2020 10:47 AM   HGBA1C 7.7 (H) 11/23/2018 08:05 AM   HGBA1C 9.1 (A) 07/21/2018 11:48 AM   HGBA1C 8.1 (A) 03/30/2018 04:05 PM   . Pharmacist Clinical Goal(s): o Over the next 90 days, patient will work with PharmD and providers to maintain A1c goal  <7.5% . Current regimen:  o Metformin XR 750 mg daily  o Ozempic 0.5 mg weekly  o Tresiba 32 units daily . Interventions: o Discussed carbohydrate counting and exercising as tolerated extensively o Will initiate blood sugar monitoring plan  . Patient self care activities - Over the next 90 days, patient will: o Check blood sugar once daily, document, and provide at future appointments o Contact provider with any episodes of hypoglycemia  Medication management . Pharmacist Clinical Goal(s): o Over the next 90 days, patient will work with PharmD and providers to maintain optimal medication adherence . Current pharmacy: [facility] . Interventions o Comprehensive medication review performed. o Continue current medication management strategy . Patient self care activities - Over the next 90 days, patient will: o Take medications as prescribed o Report any questions or concerns to PharmD and/or provider(s)    .  COMPLETED: Medication assistance- 2021 (pt-stated)        Current Barriers:  . financial  Pharmacist Clinical Goal(s): Over the next 14 days, [name] will provide the necessary supplementary documents (proof of out  of pocket prescription expenditure, proof of household income) needed for medication assistance applications to CCM pharmacist.   Interventions: . Collaborate with Dr. Ancil Roberts regarding patient's eligibility to switch to Xultophy o Used and liked Victoza in the past, stopped due to cost . CCM pharmacist will apply for medication assistance program for [name] made by [name] and prescribed by Dr. Ancil Roberts.  Marland Kitchen Updated 2/23: patient provided and pharmacist submitted letter of financial hardship  Patient Self Care Activities:  Marland Kitchen Gather necessary documents needed to apply for medication assistance  Please see past updates related to this goal by clicking on the "Past Updates" button in the selected goal        Hypertension   BP goal is:  <140/90  Office blood pressures are  BP Readings from Last 3 Encounters:  06/13/20 122/76  04/12/20 123/81  02/09/20 126/80   Patient checks BP at home 1-2x per week Patient home BP readings are ranging: NA  Patient has failed these meds in the past: NA Patient is currently controlled on the following medications:  . Lisinopril 10 mg daily  We discussed diet and exercise extensively. Lisinopril mainly for CKD protective benefit.   Plan  Continue current medications   Hyperlipidemia   LDL goal < 70  Last lipids Lab Results  Component Value Date   CHOL 152 02/09/2020   HDL 57 02/09/2020   LDLCALC 76 02/09/2020   TRIG 104 02/09/2020   CHOLHDL 2.7 02/09/2020   Hepatic Function Latest Ref Rng & Units 02/09/2020 11/23/2018 05/25/2018  Total Protein 6.1 - 8.1 g/dL 7.0 6.3 6.6  Albumin 3.5 - 5.0 g/dL - - -  AST 10 - 35 U/L 13 11 13   ALT 6 - 29 U/L 11 10 14   Alk Phosphatase 38 - 126 U/L - - -  Total Bilirubin 0.2 - 1.2 mg/dL 0.5 0.4 0.5     The 10-year ASCVD risk score Mikey Bussing DC Jr., et al., 2013) is: 36.8%   Values used to calculate the score:     Age: 21 years     Sex: Female     Is Non-Hispanic African American: No     Diabetic:  Yes     Tobacco smoker: No     Systolic Blood Pressure: 537 mmHg     Is BP treated: Yes     HDL Cholesterol: 57 mg/dL     Total Cholesterol: 152 mg/dL   Patient has failed these meds in past: Crestor (elevated LFTs)   Patient is currently uncontrolled on the following medications:  . Aspirin 81 mg daily  . Atorvastatin 40 mg 1 tablet twice weekly   We discussed:  diet and exercise extensively. Patient self-adjusts frequency between 2-3 times weekly based on her symptoms. When she takes atorvastatin 3+ times weekly, she experiences worse muscle pain and cramping. CoQ10 supplementation has helped somewhat, but not completely.   Plan  Continue current medications   Diabetes   Recent Relevant Labs: Lab Results  Component Value Date/Time   HGBA1C 6.6 (A) 06/13/2020 01:20 PM   HGBA1C 7.2 (A) 02/09/2020 10:47 AM   HGBA1C 7.7 (H) 11/23/2018 08:05 AM   HGBA1C 9.1 (A) 07/21/2018 11:48 AM   HGBA1C 8.1 (A) 03/30/2018 04:05 PM   MICROALBUR 0.4 02/09/2020 11:17 AM   MICROALBUR <0.2 11/23/2018 08:05 AM   MICROALBUR 20 01/10/2017 01:39 PM   MICROALBUR 20 09/12/2015 03:42 PM     Checking BG: 2x per Day  Recent pre-meal BG readings:   Fasting   2-Feb 76  1-Feb 124  31-Jan 96  30-Jan 94  29-Jan 88  28-Jan 86  27-Jan 85  22-Jan 64  Average 89    Patient has failed these meds in past: NA Patient is currently controlled on the following medications:  Marland Kitchen Metformin XR 750 mg daily  . Tresiba 32 units morning (0.36 u/kg)  . Ozempic 0.5 mg weekly   Last diabetic Foot exam:  Lab Results  Component Value Date/Time   HMDIABEYEEXA No Retinopathy 04/01/2018 12:00 AM    Last diabetic Eye exam: No results found for: HMDIABFOOTEX   We discussed: Patient reports her appetite has lessened significantly since recovering from Covid-19, although she will still make sure to eat a dinner, even if smaller.   Reports patient assistance for Ozempic + [name] was approved for 2022.    Patient's  blood sugars have been lower, with some instances where she experienced morning/nocturnal hypoglycemia.   Patient was previously taking and tolerating metformin IR 1000 mg twice daily. Dose was decreased to 750 mg daily when switched to XR formulation.   Plan  Recommend decreasing Tresiba to 28 units daily to reduce risk of hypoglycemia.  Recommend increasing metformin XR to 750 mg twice daily at next office visit (alongside further decrease in [place]) to minimize risk of hypoglycemia, and help patient with weight loss goal.  Recommend CGM device for more frequent blood sugar monitoring and alerts for lows (if affordable). Would be covered through her Part B insurance.  CPA assessment monthly to track blood sugar readings and monitor lows.   Misc / OTC    . Calcium citrate 950 mg + D daily  . CoQ10 100 mg daily  . Flonase 2 sprays daily   Plan  Continue current medications  Medication Management   We discussed:   Plan  Continue current medication management strategy  Follow up: 4 month phone visit  [facility] [phone]

## 2020-08-09 NOTE — Patient Instructions (Signed)
Visit Information It was great speaking with you today!  Please let me know if you have any questions about our visit. Goals Addressed              This Visit's Progress   .  Chronic Care Management (pt-stated)        CARE PLAN ENTRY (see longitudinal plan of care for additional care plan information)  Current Barriers:  . Chronic Disease Management support, education, and care coordination needs related to Hypertension, Hyperlipidemia, Diabetes, Coronary Artery Disease, Osteoarthritis, and History of TIA   Hypertension BP Readings from Last 3 Encounters:  06/13/20 122/76  04/12/20 123/81  02/09/20 126/80   . Pharmacist Clinical Goal(s): o Over the next 90 days, patient will work with PharmD and providers to maintain BP goal <140/90 . Current regimen:  o Lisinopril 10 mg daily   . Interventions: o Discussed low salt diet and exercising as tolerated extensively o Will initiate blood pressure monitoring plan  . Patient self care activities - Over the next 90 days, patient will: o Check blood pressure Weekly, document, and provide at future appointments o Ensure daily salt intake < 2300 mg/day  Hyperlipidemia Lab Results  Component Value Date/Time   LDLCALC 76 02/09/2020 11:17 AM   . Pharmacist Clinical Goal(s): o Over the next 90 days, patient will work with PharmD and providers to achieve LDL goal < 70 . Current regimen:  o Atorvastatin 40 mg 2-3 times weekly . Interventions: o Discussed low cholesterol diet and exercising as tolerated extensively o Will initiate cholesterol monitoring plan  . Patient self care activities - Over the next 90 days, patient will: o Continue taking CoQ10 to minimize cramping/ muscle pain  o Take atorvastatin as often as tolerated  Diabetes Lab Results  Component Value Date/Time   HGBA1C 6.6 (A) 06/13/2020 01:20 PM   HGBA1C 7.2 (A) 02/09/2020 10:47 AM   HGBA1C 7.7 (H) 11/23/2018 08:05 AM   HGBA1C 9.1 (A) 07/21/2018 11:48 AM   HGBA1C  8.1 (A) 03/30/2018 04:05 PM   . Pharmacist Clinical Goal(s): o Over the next 90 days, patient will work with PharmD and providers to maintain A1c goal  <7.5% . Current regimen:  o Metformin XR 750 mg daily  o Ozempic 0.5 mg weekly  o Tresiba 32 units daily . Interventions: o Discussed carbohydrate counting and exercising as tolerated extensively o Will initiate blood sugar monitoring plan  . Patient self care activities - Over the next 90 days, patient will: o Check blood sugar once daily, document, and provide at future appointments o Contact provider with any episodes of hypoglycemia  Medication management . Pharmacist Clinical Goal(s): o Over the next 90 days, patient will work with PharmD and providers to maintain optimal medication adherence . Current pharmacy: [facility] . Interventions o Comprehensive medication review performed. o Continue current medication management strategy . Patient self care activities - Over the next 90 days, patient will: o Take medications as prescribed o Report any questions or concerns to PharmD and/or provider(s)    .  COMPLETED: Medication assistance- 2021 (pt-stated)        Current Barriers:  . financial  Pharmacist Clinical Goal(s): Over the next 14 days, Christine Roberts will provide the necessary supplementary documents (proof of out of pocket prescription expenditure, proof of household income) needed for medication assistance applications to CCM pharmacist.   Interventions: . Collaborate with Dr. Ancil Boozer regarding patient's eligibility to switch to Xultophy o Used and liked Victoza in the  past, stopped due to cost . CCM pharmacist will apply for medication assistance program for Tresiba made by [employer] and prescribed by Dr. Ancil Boozer.  Marland Kitchen Updated 2/23: patient provided and pharmacist submitted letter of financial hardship  Patient Self Care Activities:  Marland Kitchen Gather necessary documents needed to apply for medication assistance  Please  see past updates related to this goal by clicking on the "Past Updates" button in the selected goal         The patient verbalized understanding of instructions, educational materials, and care plan provided today and declined offer to receive copy of patient instructions, educational materials, and care plan.   Telephone follow up appointment with pharmacy team member scheduled for: 12/06/20 at 10:00 AM  [facility] [phone]

## 2020-08-22 ENCOUNTER — Ambulatory Visit (INDEPENDENT_AMBULATORY_CARE_PROVIDER_SITE_OTHER): Payer: Medicare HMO

## 2020-08-22 ENCOUNTER — Other Ambulatory Visit: Payer: Self-pay

## 2020-08-22 VITALS — BP 128/72 | HR 95 | Temp 98.2°F | Resp 16 | Ht 64.0 in | Wt 197.8 lb

## 2020-08-22 DIAGNOSIS — Z780 Asymptomatic menopausal state: Secondary | ICD-10-CM

## 2020-08-22 DIAGNOSIS — Z0000 Encounter for general adult medical examination without abnormal findings: Secondary | ICD-10-CM

## 2020-08-22 NOTE — Progress Notes (Signed)
Subjective:   Christine Roberts is a 75 y.o. female who presents for Medicare Annual (Subsequent) preventive examination.  Review of Systems     Cardiac Risk Factors include: advanced age (>33mn, >>57women);diabetes mellitus;dyslipidemia;hypertension;obesity (BMI >30kg/m2)     Objective:    Today's Vitals   08/22/20 1511  BP: 128/72  Pulse: 95  Resp: 16  Temp: 98.2 F (36.8 C)  TempSrc: Oral  SpO2: 99%  Weight: 197 lb 12.8 oz (89.7 kg)  Height: 5' 4" (1.626 m)   Body mass index is 33.95 kg/m.  Advanced Directives 08/22/2020 07/27/2019 12/31/2018 07/21/2018 05/22/2018 03/14/2018 03/04/2018  Does Patient Have a Medical Advance Directive? Yes Yes No Yes No No Yes  Type of AParamedicof ADoraLiving will [place]incoln[place]Living will - HSuringLiving will - - -  Does patient want to make changes to medical advance directive? - - - - - - -  Copy of HGilbertin Chart? No - copy requested No - copy requested - No - copy requested - - -  Would patient like information on creating a medical advance directive? - - No - Patient declined - No - Patient declined No - Patient declined -    Current Medications (verified) Outpatient Encounter Medications as of 08/22/2020  Medication Sig  . ACCU-CHEK SOFTCLIX LANCETS lancets 1 each by Other route daily.  . Alcohol Swabs (B-D SINGLE USE SWABS REGULAR) PADS 1 each by Does not apply route daily.  .Marland Kitchenaspirin EC 81 MG tablet Take 1 tablet (81 mg total) by mouth daily.  .Marland Kitchenatorvastatin (LIPITOR) 40 MG tablet TAKE 1 TABLET 2 TIMES A WEEK.  . calcium citrate (CALCITRATE - DOSED IN MG ELEMENTAL CALCIUM) 950 MG tablet Take 200 mg of elemental calcium by mouth daily. Has D3 in it as well  . Cholecalciferol (VITAMIN D) 125 MCG (5000 UT) CAPS Take by mouth. Pt taking 3 times per week  . Coenzyme Q10 (COQ10) 100 MG CAPS Take 1 capsule by mouth daily.  . fluticasone (FLONASE) 50 MCG/ACT  nasal spray Place 2 sprays into both nostrils daily.  .Marland Kitchenglucose blood (ACCU-CHEK AVIVA PLUS) test strip TEST BLOOD SUGAR UP TO FOUR TIMES DAILY AS DIRECTED  . insulin degludec (TRESIBA FLEXTOUCH) 200 UNIT/ML FlexTouch Pen Inject 32 Units into the skin daily.  . Insulin Pen Needle (NOVOFINE) 30G X 8 MM MISC Inject 10 each into the skin as needed.  .Marland Kitchenlisinopril (ZESTRIL) 10 MG tablet Take 1 tablet (10 mg total) by mouth daily.  . metFORMIN (GLUCOPHAGE-XR) 750 MG 24 hr tablet TAKE 1 TABLET (750 MG TOTAL) BY MOUTH DAILY WITH BREAKFAST.  .Marland Kitchenpolyethylene glycol powder (GLYCOLAX/MIRALAX) powder Take 17 g by mouth 2 (two) times daily as needed.  . Semaglutide,0.25 or 0.5MG/DOS, (OZEMPIC, 0.25 OR 0.5 MG/DOSE,) 2 MG/1.5ML SOPN Inject 0.375 mLs (0.5 mg total) into the skin once a week.  .Marland KitchenVITAMIN B COMPLEX-C PO Take by mouth.  . [DISCONTINUED] Blood Glucose Calibration (TRUE METRIX LEVEL 1) Low SOLN 1 each by In Vitro route daily.  . [DISCONTINUED] blood glucose meter kit and supplies KIT Dispense based on patient and insurance preference. Use up to four times daily as directed. (FOR ICD-9 250.00, 250.01).  . [DISCONTINUED] Lancet Devices MISC 1 Units by Does not apply route 2 (two) times daily.   No facility-administered encounter medications on file as of 08/22/2020.    Allergies (verified) Latex and Erythromycin   History: Past  Medical History:  Diagnosis Date  . Diabetes mellitus without complication ([place])   . Hyperlipidemia   . Hypertension   . Kidney stone   . Left shoulder pain   . Obesity   . Osteoarthritis   . Ovarian failure   . Stroke ([place])   . Vitamin B12 deficiency (non anemic)    Past Surgical History:  Procedure Laterality Date  . ABDOMINAL HYSTERECTOMY    . CHOLECYSTECTOMY    . COLONOSCOPY  2015  . COLONOSCOPY WITH PROPOFOL N/A 03/04/2018   Procedure: COLONOSCOPY WITH PROPOFOL;  Surgeon: Toledo, Benay Pike, MD;  Location: [facility] ENDOSCOPY;  Service: Gastroenterology;   Laterality: N/A;  . cystoscopic     stent placed in ureter  . LITHOTRIPSY  12/2007  . MYRINGOTOMY Right 11/2009   Family History  Problem Relation Age of Onset  . Cancer Mother   . Diabetes Mother   . Diabetes Daughter   . CAD Father   . Diabetes Daughter        hypoglycemia   Social History   Socioeconomic History  . Marital status: Widowed    Spouse name: Not on file  . Number of children: 2  . Years of education: Not on file  . Highest education level: Associate degree: occupational, Hotel manager, or vocational program  Occupational History    Employer: [employer]    Comment: retired  Tobacco Use  . Smoking status: Never Smoker  . Smokeless tobacco: Never Used  . Tobacco comment: smoking cessation materials not required  Vaping Use  . Vaping Use: Never used  Substance and Sexual Activity  . Alcohol use: Yes    Alcohol/week: 1.0 standard drink    Types: 1 Shots of liquor per week    Comment: socially  . Drug use: No  . Sexual activity: Not Currently  Other Topics Concern  . Not on file  Social History Narrative   Pt lives alone   Social Determinants of Health   Financial Resource Strain: Low Risk   . Difficulty of Paying Living Expenses: Not hard at all  Food Insecurity: No Food Insecurity  . Worried About Charity fundraiser in the Last Year: Never true  . Ran Out of Food in the Last Year: Never true  Transportation Needs: No Transportation Needs  . Lack of Transportation (Medical): No  . Lack of Transportation (Non-Medical): No  Physical Activity: Insufficiently Active  . Days of Exercise per Week: 3 days  . Minutes of Exercise per Session: 30 min  Stress: No Stress Concern Present  . Feeling of Stress : Only a little  Social Connections: Moderately Isolated  . Frequency of Communication with Friends and Family: More than three times a week  . Frequency of Social Gatherings with Friends and Family: Three times a week  . Attends Religious  Services: More than 4 times per year  . Active Member of Clubs or Organizations: No  . Attends Archivist Meetings: Never  . Marital Status: Widowed    Tobacco Counseling Counseling given: Not Answered Comment: smoking cessation materials not required   Clinical Intake:  Pre-visit preparation completed: Yes  Pain : No/denies pain     BMI - recorded: 33.95 Nutritional Status: BMI > 30  Obese Nutritional Risks: None Diabetes: Yes CBG done?: No Did pt. bring in CBG monitor from home?: No  How often do you need to have someone help you when you read instructions, pamphlets, or other written materials from your doctor or  pharmacy?: 1 - Never  Nutrition Risk Assessment:  Has the patient had any N/V/D within the last 2 months?  No  Does the patient have any non-healing wounds?  No  Has the patient had any unintentional weight loss or weight gain?  No   Diabetes:  Is the patient diabetic?  Yes  If diabetic, was a CBG obtained today?  No  Did the patient bring in their glucometer from home?  No  How often do you monitor your CBG's? daily.   Financial Strains and Diabetes Management:  Are you having any financial strains with the device, your supplies or your medication? Yes. Does the patient want to be seen by Chronic Care Management for management of their diabetes?  Yes  already enrolled Would the patient like to be referred to a Nutritionist or for Diabetic Management?  No   Diabetic Exams:  Diabetic Eye Exam: Completed 04/01/18 negative retinopathy. Overdue for diabetic eye exam. Pt has been advised about the importance in completing this exam.   Diabetic Foot Exam: Completed 10/03/19.   Interpreter Needed?: No  Information entered by :: Clemetine Marker LPN   Activities of Daily Living In your present state of health, do you have any difficulty performing the following activities: 08/22/2020 06/13/2020  Hearing? N N  Comment declines hearing aids -  Vision? N  N  Difficulty concentrating or making decisions? N N  Walking or climbing stairs? N N  Dressing or bathing? N N  Doing errands, shopping? N N  Preparing Food and eating ? N -  Using the Toilet? N -  In the past six months, have you accidently leaked urine? N -  Do you have problems with loss of bowel control? N -  Managing your Medications? N -  Managing your Finances? N -  Housekeeping or managing your Housekeeping? N -  Some recent data might be hidden    Patient Care Team: Steele Sizer, MD as PCP - General (Family Medicine) Dingeldein, Remo Lipps, MD as Consulting Physician (Ophthalmology) Randa Spike, MD as Consulting Physician (Dermatology) Billey Co, MD as Consulting Physician (Urology) Efrain Sella, MD as Consulting Physician (Gastroenterology) Herbert Pun, MD as Consulting Physician (General Surgery) Germaine Pomfret, [facility] (Pharmacist)  Indicate any recent Medical Services you may have received from other than [facility] providers in the past year (date may be approximate).     Assessment:   This is a routine wellness examination for [place].  Hearing/Vision screen  Hearing Screening   '125Hz'$  $Remo'250Hz'nfHeZ$'500Hz'$'1000Hz'$'2000Hz'$'3000Hz'$'4000Hz'$'6000Hz'$'8000Hz'$   Right ear:           Left ear:           Comments: Pt has no difficulty hearing  Vision Screening Comments: Annual vision screenings done by [facility]  Dietary issues and exercise activities discussed: Current Exercise Habits: Home exercise routine, Type of exercise: walking, Time (Minutes): 30, Frequency (Times/Week): 3, Weekly Exercise (Minutes/Week): 90, Intensity: Mild, Exercise limited by: None identified  Goals    .  Chronic Care Management (pt-stated)      CARE PLAN ENTRY (see longitudinal plan of care for additional care plan information)  Current Barriers:  . Chronic Disease Management support, education, and care coordination needs related to Hypertension, Hyperlipidemia, Diabetes,  Coronary Artery Disease, Osteoarthritis, and History of TIA   Hypertension BP Readings from Last 3 Encounters:  06/13/20 122/76  04/12/20 123/81  02/09/20 126/80   . Pharmacist Clinical Goal(s): o Over  the next 90 days, patient will work with PharmD and providers to maintain BP goal <140/90 . Current regimen:  o Lisinopril 10 mg daily   . Interventions: o Discussed low salt diet and exercising as tolerated extensively o Will initiate blood pressure monitoring plan  . Patient self care activities - Over the next 90 days, patient will: o Check blood pressure Weekly, document, and provide at future appointments o Ensure daily salt intake < 2300 mg/day  Hyperlipidemia Lab Results  Component Value Date/Time   LDLCALC 76 02/09/2020 11:17 AM   . Pharmacist Clinical Goal(s): o Over the next 90 days, patient will work with PharmD and providers to achieve LDL goal < 70 . Current regimen:  o Atorvastatin 40 mg 2-3 times weekly . Interventions: o Discussed low cholesterol diet and exercising as tolerated extensively o Will initiate cholesterol monitoring plan  . Patient self care activities - Over the next 90 days, patient will: o Continue taking CoQ10 to minimize cramping/ muscle pain  o Take atorvastatin as often as tolerated  Diabetes Lab Results  Component Value Date/Time   HGBA1C 6.6 (A) 06/13/2020 01:20 PM   HGBA1C 7.2 (A) 02/09/2020 10:47 AM   HGBA1C 7.7 (H) 11/23/2018 08:05 AM   HGBA1C 9.1 (A) 07/21/2018 11:48 AM   HGBA1C 8.1 (A) 03/30/2018 04:05 PM   . Pharmacist Clinical Goal(s): o Over the next 90 days, patient will work with PharmD and providers to maintain A1c goal  <7.5% . Current regimen:  o Metformin XR 750 mg daily  o Ozempic 0.5 mg weekly  o Tresiba 32 units daily . Interventions: o Discussed carbohydrate counting and exercising as tolerated extensively o Will initiate blood sugar monitoring plan  . Patient self care activities - Over the next 90 days,  patient will: o Check blood sugar once daily, document, and provide at future appointments o Contact provider with any episodes of hypoglycemia  Medication management . Pharmacist Clinical Goal(s): o Over the next 90 days, patient will work with PharmD and providers to maintain optimal medication adherence . Current pharmacy: [facility] . Interventions o Comprehensive medication review performed. o Continue current medication management strategy . Patient self care activities - Over the next 90 days, patient will: o Take medications as prescribed o Report any questions or concerns to PharmD and/or provider(s)    .  DIET - INCREASE WATER INTAKE      Recommend to drink at least 6-8 8oz glasses of water per day     .  Hyperlipidemia - Goal LDL < 70 (pt-stated)      CARE PLAN ENTRY (see longitudinal plan of care for additional care plan information)  Current Barriers:  . Uncontrolled hyperlipidemia, complicated by statin intolerance . Current antihyperlipidemic regimen: Lipitor $RemoveBeforeDE'40mg'MtFgGHgrKtzNCaH$  weekly . Previous antihyperlipidemic medications tried Crestor . Most recent lipid panel:     Component Value Date/Time   CHOL 158 11/23/2018 0805   CHOL 190 04/04/2015 0810   TRIG 59 11/23/2018 0805   HDL 62 11/23/2018 0805   HDL 65 04/04/2015 0810   CHOLHDL 2.5 11/23/2018 0805   VLDL 23 05/24/2015 1020   LDLCALC 82 11/23/2018 0805 .   Marland Kitchen ASCVD risk enhancing conditions: age >54, DM, HTN, CKD, CHF, current smoker  Pharmacist Clinical Goal(s):  Marland Kitchen Over the next 90 days, patient will work with PharmD and providers towards optimized antihyperlipidemic therapy  Interventions: . Comprehensive medication review performed; medication list updated in electronic medical record.  Bertram Savin care team collaboration (see  longitudinal plan of care) . Recommended Co-Q10 repletion for 14 days . Increase Lipitor to twice weekly  Patient Self Care Activities:  . Patient will focus on  medication adherence by taking 1 Co-Q10 tablet daily . With guidance from pharmacy slowly increase Lipitor to daily over a period of 90 days . Increase Lipitor and report any adverse effects . Patient taking CoQ10 and has doubled the Lipitor to twice weekly  Initial goal documentation     .  Weight (lb) < 200 lb (90.7 kg)      Pt would like to lose 10-20 lbs this year.       Depression Screen PHQ 2/9 Scores 08/22/2020 06/13/2020 03/23/2020 02/09/2020 10/01/2019 07/27/2019 07/27/2019  PHQ - 2 Score 0 1 0 0 0 1 0  PHQ- 9 Score - 2 - 2 0 2 -    Fall Risk Fall Risk  08/22/2020 06/13/2020 03/23/2020 02/09/2020 10/01/2019  Falls in the past year? 0 0 0 0 0  Number falls in past yr: 0 0 0 0 0  Injury with Fall? 0 0 0 0 0  Risk for fall due to : No Fall Risks - - - -  Follow up Falls prevention discussed - - - -    FALL RISK PREVENTION PERTAINING TO THE HOME:  Any stairs in or around the home? Yes  If so, are there any without handrails? No  Home free of loose throw rugs in walkways, pet beds, electrical cords, etc? Yes  Adequate lighting in your home to reduce risk of falls? Yes   ASSISTIVE DEVICES UTILIZED TO PREVENT FALLS:   Life alert? No  Use of a cane, walker or w/c? No  Grab bars in the bathroom? Yes  Shower chair or bench in shower? Yes  Elevated toilet seat or a handicapped toilet? Yes   TIMED UP AND GO:  Was the test performed? Yes .  Length of time to ambulate 10 feet: 5 sec.   Gait steady and fast without use of assistive device  Cognitive Function: Normal cognitive status assessed by direct observation by this Nurse Health Advisor. No abnormalities found.       6CIT Screen 07/27/2019 07/21/2018 07/18/2017  What Year? 0 points 0 points 0 points  What month? 0 points 0 points 0 points  What time? 0 points 0 points 0 points  Count back from 20 0 points 0 points 0 points  Months in reverse 0 points 0 points 0 points  Repeat phrase 0 points 0 points 0 points  Total Score 0 0  0    Immunizations Immunization History  Administered Date(s) Administered  . Fluad Quad(high Dose 65+) 03/24/2019  . Influenza Split 04/11/2009  . Influenza, High Dose Seasonal PF 03/21/2015, 05/10/2016, 03/30/2017, 03/30/2018  . Influenza, Seasonal, Injecte, Preservative Fre 04/07/2012  . Influenza,inj,Quad PF,6+ Mos 03/07/2014  . Influenza-Unspecified 03/07/2014, 03/30/2017, 05/15/2020  . PFIZER(Purple Top)SARS-COV-2 Vaccination 07/15/2019, 08/05/2019, 06/27/2020  . Pneumococcal Conjugate-13 03/21/2015  . Pneumococcal Polysaccharide-23 10/25/2009  . Tdap 10/25/2009, 03/14/2018  . Zoster 09/24/2011    TDAP status: Up to date  Flu Vaccine status: Up to date  Pneumococcal vaccine status: Up to date  Covid-19 vaccine status: Completed vaccines  Qualifies for Shingles Vaccine? Yes   Zostavax completed Yes   Shingrix Completed?: No.    Education has been provided regarding the importance of this vaccine. Patient has been advised to call insurance company to determine out of pocket expense if they have not yet received this vaccine.  Advised may also receive vaccine at local pharmacy or Health Dept. Verbalized acceptance and understanding.  Screening Tests Health Maintenance  Topic Date Due  . OPHTHALMOLOGY EXAM  04/02/2019  . FOOT EXAM  09/30/2020  . HEMOGLOBIN A1C  12/12/2020  . MAMMOGRAM  03/17/2021  . COLONOSCOPY (Pts 45-2yr Insurance coverage will need to be confirmed)  03/05/2023  . TETANUS/TDAP  03/14/2028  . INFLUENZA VACCINE  Completed  . DEXA SCAN  Completed  . COVID-19 Vaccine  Completed  . Hepatitis C Screening  Completed  . PNA vac Low Risk Adult  Completed    Health Maintenance  Health Maintenance Due  Topic Date Due  . OPHTHALMOLOGY EXAM  04/02/2019    Colorectal cancer screening: Type of screening: Colonoscopy. Completed 03/04/18. Repeat every 5 years  Mammogram status: Completed 03/18/19. Repeat every year. Ordered 09/14/19  Bone Density status:  Completed 2011. Results reflect: Bone density results: NORMAL. Repeat every 2 years.  Ordered today.  Lung Cancer Screening: (Low Dose CT Chest recommended if Age 13-80years, 30 pack-year currently smoking OR have quit w/in 15years.) does not qualify.    Additional Screening:  Hepatitis C Screening: does qualify; Completed 03/30/13  Vision Screening: Recommended annual ophthalmology exams for early detection of glaucoma and other disorders of the eye. Is the patient up to date with their annual eye exam?  No  Who is the provider or what is the name of the office in which the patient attends annual eye exams? [facility]: Recommended annual dental exams for proper oral hygiene  Community Resource Referral / Chronic Care Management: CRR required this visit?  No   CCM required this visit?  No      Plan:     I have personally reviewed and noted the following in the patient's chart:   . Medical and social history . Use of alcohol, tobacco or illicit drugs  . Current medications and supplements . Functional ability and status . Nutritional status . Physical activity . Advanced directives . List of other physicians . Hospitalizations, surgeries, and ER visits in previous 12 months . Vitals . Screenings to include cognitive, depression, and falls . Referrals and appointments  In addition, I have reviewed and discussed with patient certain preventive protocols, quality metrics, and best practice recommendations. A written personalized care plan for preventive services as well as general preventive health recommendations were provided to patient.     KClemetine Marker LPN   24/37/0052  Nurse Notes: pt states she is on her last pen of ozempic and will be needing additional supply to continue. [facility]. She has 3 boxes of tresiba at this time. [facility].

## 2020-08-22 NOTE — Patient Instructions (Signed)
Christine Roberts , Thank you for taking time to come for your Medicare Wellness Visit. I appreciate your ongoing commitment to your health goals. Please review the following plan we discussed and let me know if I can assist you in the future.   Screening recommendations/referrals: Colonoscopy: done 03/04/18. Repeat in 2024 Mammogram: done 03/18/19. Please call [phone] to schedule your mammogram and bone density screening.  Bone Density: done 2011 Recommended yearly ophthalmology/optometry visit for glaucoma screening and checkup Recommended yearly dental visit for hygiene and checkup  Vaccinations: Influenza vaccine: done 05/15/20 Pneumococcal vaccine: done 03/21/15 Tdap vaccine: done 03/14/18 Shingles vaccine: Shingrix discussed. Please contact your pharmacy for coverage information.  Covid-19: done 07/15/19, 08/05/19 & 06/27/20    Advanced directives: Please bring a copy of your health care power of attorney and living will to the office at your convenience.  Conditions/risks identified: Recommend healthy eating and physical activity for desired weight loss.   Next appointment: Follow up in one year for your annual wellness visit    Preventive Care 65 Years and Older, Female Preventive care refers to lifestyle choices and visits with your health care provider that can promote health and wellness. What does preventive care include?  A yearly physical exam. This is also called an annual well check.  Dental exams once or twice a year.  Routine eye exams. Ask your health care provider how often you should have your eyes checked.  Personal lifestyle choices, including:  Daily care of your teeth and gums.  Regular physical activity.  Eating a healthy diet.  Avoiding tobacco and drug use.  Limiting alcohol use.  Practicing safe sex.  Taking low-dose aspirin every day.  Taking vitamin and mineral supplements as recommended by your health care provider. What happens during an annual  well check? The services and screenings done by your health care provider during your annual well check will depend on your age, overall health, lifestyle risk factors, and family history of disease. Counseling  Your health care provider may ask you questions about your:  Alcohol use.  Tobacco use.  Drug use.  Emotional well-being.  Home and relationship well-being.  Sexual activity.  Eating habits.  History of falls.  Memory and ability to understand (cognition).  Work and work Statistician.  Reproductive health. Screening  You may have the following tests or measurements:  Height, weight, and BMI.  Blood pressure.  Lipid and cholesterol levels. These may be checked every 5 years, or more frequently if you are over 71 years old.  Skin check.  Lung cancer screening. You may have this screening every year starting at age 42 if you have a 30-pack-year history of smoking and currently smoke or have quit within the past 15 years.  Fecal occult blood test (FOBT) of the stool. You may have this test every year starting at age 20.  Flexible sigmoidoscopy or colonoscopy. You may have a sigmoidoscopy every 5 years or a colonoscopy every 10 years starting at age 16.  Hepatitis C blood test.  Hepatitis B blood test.  Sexually transmitted disease (STD) testing.  Diabetes screening. This is done by checking your blood sugar (glucose) after you have not eaten for a while (fasting). You may have this done every 1-3 years.  Bone density scan. This is done to screen for osteoporosis. You may have this done starting at age 59.  Mammogram. This may be done every 1-2 years. Talk to your health care provider about how often you should have regular mammograms.  Talk with your health care provider about your test results, treatment options, and if necessary, the need for more tests. Vaccines  Your health care provider may recommend certain vaccines, such as:  Influenza vaccine. This  is recommended every year.  Tetanus, diphtheria, and acellular pertussis (Tdap, Td) vaccine. You may need a Td booster every 10 years.  Zoster vaccine. You may need this after age 26.  Pneumococcal 13-valent conjugate (PCV13) vaccine. One dose is recommended after age 57.  Pneumococcal polysaccharide (PPSV23) vaccine. One dose is recommended after age 102. Talk to your health care provider about which screenings and vaccines you need and how often you need them. This information is not intended to replace advice given to you by your health care provider. Make sure you discuss any questions you have with your health care provider. Document Released: 07/21/2015 Document Revised: 03/13/2016 Document Reviewed: 04/25/2015 Elsevier Interactive Patient Education  2017 [place] Prevention in the Home Falls can cause injuries. They can happen to people of all ages. There are many things you can do to make your home safe and to help prevent falls. What can I do on the outside of my home?  Regularly fix the edges of walkways and driveways and fix any cracks.  Remove anything that might make you trip as you walk through a door, such as a raised step or threshold.  Trim any bushes or trees on the path to your home.  Use bright outdoor lighting.  Clear any walking paths of anything that might make someone trip, such as rocks or tools.  Regularly check to see if handrails are loose or broken. Make sure that both sides of any steps have handrails.  Any raised decks and porches should have guardrails on the edges.  Have any leaves, snow, or ice cleared regularly.  Use sand or salt on walking paths during winter.  Clean up any spills in your garage right away. This includes oil or grease spills. What can I do in the bathroom?  Use night lights.  Install grab bars by the toilet and in the tub and shower. Do not use towel bars as grab bars.  Use non-skid mats or decals in the tub or  shower.  If you need to sit down in the shower, use a plastic, non-slip stool.  Keep the floor dry. Clean up any water that spills on the floor as soon as it happens.  Remove soap buildup in the tub or shower regularly.  Attach bath mats securely with double-sided non-slip rug tape.  Do not have throw rugs and other things on the floor that can make you trip. What can I do in the bedroom?  Use night lights.  Make sure that you have a light by your bed that is easy to reach.  Do not use any sheets or blankets that are too big for your bed. They should not hang down onto the floor.  Have a firm chair that has side arms. You can use this for support while you get dressed.  Do not have throw rugs and other things on the floor that can make you trip. What can I do in the kitchen?  Clean up any spills right away.  Avoid walking on wet floors.  Keep items that you use a lot in easy-to-reach places.  If you need to reach something above you, use a strong step stool that has a grab bar.  Keep electrical cords out of the way.  Do not use floor polish or wax that makes floors slippery. If you must use wax, use non-skid floor wax.  Do not have throw rugs and other things on the floor that can make you trip. What can I do with my stairs?  Do not leave any items on the stairs.  Make sure that there are handrails on both sides of the stairs and use them. Fix handrails that are broken or loose. Make sure that handrails are as long as the stairways.  Check any carpeting to make sure that it is firmly attached to the stairs. Fix any carpet that is loose or worn.  Avoid having throw rugs at the top or bottom of the stairs. If you do have throw rugs, attach them to the floor with carpet tape.  Make sure that you have a light switch at the top of the stairs and the bottom of the stairs. If you do not have them, ask someone to add them for you. What else can I do to help prevent  falls?  Wear shoes that:  Do not have high heels.  Have rubber bottoms.  Are comfortable and fit you well.  Are closed at the toe. Do not wear sandals.  If you use a stepladder:  Make sure that it is fully opened. Do not climb a closed stepladder.  Make sure that both sides of the stepladder are locked into place.  Ask someone to hold it for you, if possible.  Clearly mark and make sure that you can see:  Any grab bars or handrails.  First and last steps.  Where the edge of each step is.  Use tools that help you move around (mobility aids) if they are needed. These include:  Canes.  Walkers.  Scooters.  Crutches.  Turn on the lights when you go into a dark area. Replace any light bulbs as soon as they burn out.  Set up your furniture so you have a clear path. Avoid moving your furniture around.  If any of your floors are uneven, fix them.  If there are any pets around you, be aware of where they are.  Review your medicines with your doctor. Some medicines can make you feel dizzy. This can increase your chance of falling. Ask your doctor what other things that you can do to help prevent falls. This information is not intended to replace advice given to you by your health care provider. Make sure you discuss any questions you have with your health care provider. Document Released: 04/20/2009 Document Revised: 11/30/2015 Document Reviewed: 07/29/2014 Elsevier Interactive Patient Education  2017 [employer].

## 2020-08-22 NOTE — Addendum Note (Signed)
Addended by: Clemetine Marker D on: 08/22/2020 04:16 PM   Modules accepted: Orders

## 2020-09-01 ENCOUNTER — Telehealth: Payer: Self-pay

## 2020-09-01 NOTE — Progress Notes (Signed)
Per Clinical Pharmacist She told me at our last visit that she was approved for patient assistance, did she get a shipment from [employer] for her Ozempic and tresiba? If not we can put in a request for a delivery.   Please also let her know that if she is seeing multiple instances of low blood sugars (less than 80) she should set up a follow-up with Dr. Ancil Boozer to discuss this.   Patient states she has not receive a shipment yet for Ozempic or [place] from [employer].Patient asking if the shipment will be shipped to her house or the doctor office.  Informed patient per Clinical Pharmacist, we will fill out a refill form to get a refill request in for Ozempic and [place].Patient delivery will be ship to patient PCP office.  Patient Verbalized understanding and agrees with making appointment if she has multiple low blood sugars (less than 80).  [place] Pharmacist Assistant [phone]

## 2020-09-01 NOTE — Telephone Encounter (Signed)
-----   Message from Germaine Pomfret, [facility] sent at 08/28/2020  8:08 AM EST ----- Regarding: FW: patient assistance Bessie, can you reach out to [place] about her patient assistance? She told me at our last visit that she was approved for patient assistance, did she get a shipment from [employer] for her Ozempic and tresiba? If not we can put in a request for a delivery.   Please also let her know that if she is seeing multiple instances of low blood sugars (less than 80) she should set up a follow-up with Dr. Ancil Boozer to discuss this.   Thanks, [facility] [phone]   ----- Message ----- From: Clemetine Marker, LPN Sent: 4/81/8563   3:54 PM EST To: Germaine Pomfret, RPH Subject: patient assistance                             Hi Alex,  I saw this patient today and she said that you have been helping her with patient assistance and sam[ples of her ozempic and tresiba. She wanted me to let you know she is down to her last pen of ozempic because they only come one to a box but she still has 3 boxes of tresiba. She also wanted to check with you to see if you had talked to Dr. Ancil Boozer about lowering her metformin due to her blood sugars being lower in the morning. Today was 84 and that's her average. She said that she returned the paperwork you requested as well. Let me know if you have any questions.   Thank you! Clemetine Marker LPN

## 2020-09-11 ENCOUNTER — Telehealth: Payer: Self-pay

## 2020-09-11 NOTE — Progress Notes (Signed)
Chronic Care Management Pharmacy Assistant   Name: Christine Roberts  MRN: [id number] DOB: April 07, 1946   Reason for Encounter:Diabetes  Disease State  Call and patient assistance refill form.   Conditions to be addressed/monitored: Diabetes  Primary concerns for visit include: Diabetes   Recent office visits:  No Recent Office Visit  Recent consult visits:  No recent Consult visit  Hospital visits:  None in previous 6 months  Medications: Outpatient Encounter Medications as of 09/11/2020  Medication Sig Note  . ACCU-CHEK SOFTCLIX LANCETS lancets 1 each by Other route daily.   . Alcohol Swabs (B-D SINGLE USE SWABS REGULAR) PADS 1 each by Does not apply route daily.   Marland Kitchen aspirin EC 81 MG tablet Take 1 tablet (81 mg total) by mouth daily.   Marland Kitchen atorvastatin (LIPITOR) 40 MG tablet TAKE 1 TABLET 2 TIMES A WEEK.   . calcium citrate (CALCITRATE - DOSED IN MG ELEMENTAL CALCIUM) 950 MG tablet Take 200 mg of elemental calcium by mouth daily. Has D3 in it as well   . Cholecalciferol (VITAMIN D) 125 MCG (5000 UT) CAPS Take by mouth. Pt taking 3 times per week   . Coenzyme Q10 (COQ10) 100 MG CAPS Take 1 capsule by mouth daily.   . fluticasone (FLONASE) 50 MCG/ACT nasal spray Place 2 sprays into both nostrils daily. 08/22/2020: PRN  . glucose blood (ACCU-CHEK AVIVA PLUS) test strip TEST BLOOD SUGAR UP TO FOUR TIMES DAILY AS DIRECTED   . insulin degludec (TRESIBA FLEXTOUCH) 200 UNIT/ML FlexTouch Pen Inject 32 Units into the skin daily.   . Insulin Pen Needle (NOVOFINE) 30G X 8 MM MISC Inject 10 each into the skin as needed.   Marland Kitchen lisinopril (ZESTRIL) 10 MG tablet Take 1 tablet (10 mg total) by mouth daily.   . metFORMIN (GLUCOPHAGE-XR) 750 MG 24 hr tablet TAKE 1 TABLET (750 MG TOTAL) BY MOUTH DAILY WITH BREAKFAST.   Marland Kitchen polyethylene glycol powder (GLYCOLAX/MIRALAX) powder Take 17 g by mouth 2 (two) times daily as needed.   . Semaglutide,0.25 or 0.5MG /DOS, (OZEMPIC, 0.25 OR 0.5 MG/DOSE,) 2 MG/1.5ML SOPN  Inject 0.375 mLs (0.5 mg total) into the skin once a week.   Marland Kitchen VITAMIN B COMPLEX-C PO Take by mouth.    No facility-administered encounter medications on file as of 09/11/2020.      Star Rating Drugs:  Atorvastatin 40 mg tablet, lisinopril 10 mg tablet,metformin 750 mg tablet  Recent Relevant Labs: Lab Results  Component Value Date/Time   HGBA1C 6.6 (A) 06/13/2020 01:20 PM   HGBA1C 7.2 (A) 02/09/2020 10:47 AM   HGBA1C 7.7 (H) 11/23/2018 08:05 AM   HGBA1C 9.1 (A) 07/21/2018 11:48 AM   HGBA1C 8.1 (A) 03/30/2018 04:05 PM   MICROALBUR 0.4 02/09/2020 11:17 AM   MICROALBUR <0.2 11/23/2018 08:05 AM   MICROALBUR 20 01/10/2017 01:39 PM   MICROALBUR 20 09/12/2015 03:42 PM    Kidney Function Lab Results  Component Value Date/Time   CREATININE 0.94 (H) 02/09/2020 11:17 AM   CREATININE 0.69 11/23/2018 08:05 AM   GFRNONAA 59 (L) 02/09/2020 11:17 AM   GFRAA 69 02/09/2020 11:17 AM    . Current antihyperglycemic regimen:  ? Metformin XR 750 mg daily  ? Ozempic 0.5 mg weekly  ? Tresiba 32 units daily . What recent interventions/DTPs have been made to improve glycemic control:  ? Check blood sugar once daily, document, and provide at future appointments ? Contact provider with any episodes of hypoglycemia . Have there been any recent hospitalizations  or ED visits since last visit with CPP? No . Patient denies hypoglycemic symptoms, including Pale, Sweaty, Shaky, Hungry, Nervous/irritable and Vision changes . Patient denies hyperglycemic symptoms, including blurry vision, excessive thirst, fatigue, polyuria and weakness . How often are you checking your blood sugar? once daily . What are your blood sugars ranging?  o Fasting: N/A o Before meals:  - On 09/11/2020 it was 84. - On 09/10/2020 it was 93. - On 09/09/2020 it was 110. - On 09/08/2020 it was 91. o After meals: N/A o Bedtime: N/A . During the week, how often does your blood glucose drop below 70? Once a week. Patient states her  blood sugar was 65 because she did not eat anything. . Are you checking your feet daily/regularly?   Patient denies numbness , pain or tingling sensations in her feet.  Adherence Review: Is the patient currently on a STATIN medication? Yes Is the patient currently on ACE/ARB medication? Yes Does the patient have >5 day gap between last estimated fill dates? Yes  Patient states she use her last dose of Ozempic. notified Clinical pharmacist.  Completed refill form for ozempic and sent to clinical pharmacist for review.   [place] Pharmacist Assistant [phone]

## 2020-09-22 ENCOUNTER — Telehealth: Payer: Self-pay

## 2020-09-22 NOTE — Chronic Care Management (AMB) (Signed)
Chronic Care Management Pharmacy Assistant   Name: Christine Roberts  MRN: [id number] DOB: Mar 18, 1946  Reason for Encounter: Patient Assistance Coordination   09/22/2020- Called patient to see if she received her Ozempic supply. No answer, left message to return call.  Patient returned call, she informed me she did not get her medications yet and has not heard from anyone, patient aware her PCP office would have called, medications for Novo Nordisk go to the providers office. Patient has not heard from PCP office.  Called Dr Ancil Boozer office to check if Larna Daughters has arrived to the office, spoke with Minette Brine, she checked with nurses and no medications have been received.  [employer] patient assistance program to get a voucher for patient to use at local pharmacy until her shipment is received. On hold for 30 minutes until representative answered phone. Spoke with [employer] and she stated they do not have an 6213 application on file for patient. They have received a refill form but patient's enrollment ended 06/2020. They will need a new application for 0865 re-enrollment process.   Called patient back, she is aware, she has been out of Ozempic for about 2 weeks but is monitoring her blood sugars, fasting blood sugar this morning was at 107. Patient states she is also eating something at night now per [name], CPP instructions to make sure blood sugars do not drop below 80 or 70 at night. Patient is available to come by the office next Wednesday 09/27/2020 to sign new forms and to bring [place] documents. Patient will continue to monitor blood sugars and will report to PCP if levels increase or drop without the Ozempic. Patient also inquired about sample until PAP medications come in. Patient assistance form filled out for Ozempic and [place] with [employer] and uploaded to PharmD to print.  [name], CPP notified.    Medications: Outpatient  Encounter Medications as of 09/22/2020  Medication Sig Note  . ACCU-CHEK SOFTCLIX LANCETS lancets 1 each by Other route daily.   . Alcohol Swabs (B-D SINGLE USE SWABS REGULAR) PADS 1 each by Does not apply route daily.   Marland Kitchen aspirin EC 81 MG tablet Take 1 tablet (81 mg total) by mouth daily.   Marland Kitchen atorvastatin (LIPITOR) 40 MG tablet TAKE 1 TABLET 2 TIMES A WEEK.   . calcium citrate (CALCITRATE - DOSED IN MG ELEMENTAL CALCIUM) 950 MG tablet Take 200 mg of elemental calcium by mouth daily. Has D3 in it as well   . Cholecalciferol (VITAMIN D) 125 MCG (5000 UT) CAPS Take by mouth. Pt taking 3 times per week   . Coenzyme Q10 (COQ10) 100 MG CAPS Take 1 capsule by mouth daily.   . fluticasone (FLONASE) 50 MCG/ACT nasal spray Place 2 sprays into both nostrils daily. 08/22/2020: PRN  . glucose blood (ACCU-CHEK AVIVA PLUS) test strip TEST BLOOD SUGAR UP TO FOUR TIMES DAILY AS DIRECTED   . insulin degludec (TRESIBA FLEXTOUCH) 200 UNIT/ML FlexTouch Pen Inject 32 Units into the skin daily.   . Insulin Pen Needle (NOVOFINE) 30G X 8 MM MISC Inject 10 each into the skin as needed.   Marland Kitchen lisinopril (ZESTRIL) 10 MG tablet Take 1 tablet (10 mg total) by mouth daily.   . metFORMIN (GLUCOPHAGE-XR) 750 MG 24 hr tablet TAKE 1 TABLET (750 MG TOTAL) BY MOUTH DAILY WITH BREAKFAST.   Marland Kitchen polyethylene glycol powder (GLYCOLAX/MIRALAX) powder Take 17 g by mouth 2 (two) times daily as needed.   Marland Kitchen  Semaglutide,0.25 or 0.5MG /DOS, (OZEMPIC, 0.25 OR 0.5 MG/DOSE,) 2 MG/1.5ML SOPN Inject 0.375 mLs (0.5 mg total) into the skin once a week.   Marland Kitchen VITAMIN B COMPLEX-C PO Take by mouth.    No facility-administered encounter medications on file as of 09/22/2020.    SIG: Pattricia Boss, [place] Pharmacist Assistant [phone]

## 2020-09-27 ENCOUNTER — Telehealth: Payer: Self-pay

## 2020-09-27 NOTE — Progress Notes (Signed)
    Chronic Care Management Pharmacy Assistant   Name: ROSAISELA JAMROZ  MRN: [id number] DOB: [birth year]   Reason for Encounter: Medication Review/Patient assistance for ozempic and Tresiba   Recent office visits:  None ID   Recent consult visits:  None ID  Hospital visits:  None in previous 6 months  Medications: Outpatient Encounter Medications as of 09/27/2020  Medication Sig Note  . ACCU-CHEK SOFTCLIX LANCETS lancets 1 each by Other route daily.   . Alcohol Swabs (B-D SINGLE USE SWABS REGULAR) PADS 1 each by Does not apply route daily.   Marland Kitchen aspirin EC 81 MG tablet Take 1 tablet (81 mg total) by mouth daily.   Marland Kitchen atorvastatin (LIPITOR) 40 MG tablet TAKE 1 TABLET 2 TIMES A WEEK.   . calcium citrate (CALCITRATE - DOSED IN MG ELEMENTAL CALCIUM) 950 MG tablet Take 200 mg of elemental calcium by mouth daily. Has D3 in it as well   . Cholecalciferol (VITAMIN D) 125 MCG (5000 UT) CAPS Take by mouth. Pt taking 3 times per week   . Coenzyme Q10 (COQ10) 100 MG CAPS Take 1 capsule by mouth daily.   . fluticasone (FLONASE) 50 MCG/ACT nasal spray Place 2 sprays into both nostrils daily. 08/22/2020: PRN  . glucose blood (ACCU-CHEK AVIVA PLUS) test strip TEST BLOOD SUGAR UP TO FOUR TIMES DAILY AS DIRECTED   . insulin degludec (TRESIBA FLEXTOUCH) 200 UNIT/ML FlexTouch Pen Inject 32 Units into the skin daily.   . Insulin Pen Needle (NOVOFINE) 30G X 8 MM MISC Inject 10 each into the skin as needed.   Marland Kitchen lisinopril (ZESTRIL) 10 MG tablet Take 1 tablet (10 mg total) by mouth daily.   . metFORMIN (GLUCOPHAGE-XR) 750 MG 24 hr tablet TAKE 1 TABLET (750 MG TOTAL) BY MOUTH DAILY WITH BREAKFAST.   Marland Kitchen polyethylene glycol powder (GLYCOLAX/MIRALAX) powder Take 17 g by mouth 2 (two) times daily as needed.   . Semaglutide,0.25 or 0.5MG /DOS, (OZEMPIC, 0.25 OR 0.5 MG/DOSE,) 2 MG/1.5ML SOPN Inject 0.375 mLs (0.5 mg total) into the skin once a week.   Marland Kitchen VITAMIN B COMPLEX-C PO Take by mouth.    No facility-administered  encounter medications on file as of 09/27/2020.    Star Rating Drugs:atorvastatin, lisinopril metformin,ozempic,  Spoke with [employer] to get a update on patient application for enrollment for Ozempic and [place]  On hold with [employer] for 1 hour and 18 minutes.Per [employer] they had a delay in processing the application.Patient was approved and delivery will be between 10 to 14 business days.Patient enrollment date is 07/07/2021 and patient can reapply in Media 2022.[place] Clinical Pharmacist  [place] Pharmacist Assistant [phone]

## 2020-10-04 ENCOUNTER — Other Ambulatory Visit: Payer: Self-pay | Admitting: Family Medicine

## 2020-10-04 ENCOUNTER — Other Ambulatory Visit: Payer: Self-pay

## 2020-10-04 DIAGNOSIS — E785 Hyperlipidemia, unspecified: Secondary | ICD-10-CM

## 2020-10-04 DIAGNOSIS — E1121 Type 2 diabetes mellitus with diabetic nephropathy: Secondary | ICD-10-CM

## 2020-10-04 DIAGNOSIS — I10 Essential (primary) hypertension: Secondary | ICD-10-CM

## 2020-10-04 MED ORDER — LISINOPRIL 10 MG PO TABS: 10.0000 mg | ORAL_TABLET | Freq: Every day | ORAL | 0 refills | Status: DC

## 2020-10-05 ENCOUNTER — Other Ambulatory Visit: Payer: Self-pay | Admitting: Family Medicine

## 2020-10-05 DIAGNOSIS — I10 Essential (primary) hypertension: Secondary | ICD-10-CM

## 2020-10-05 DIAGNOSIS — E1121 Type 2 diabetes mellitus with diabetic nephropathy: Secondary | ICD-10-CM

## 2020-10-05 MED ORDER — LISINOPRIL 10 MG PO TABS: 10.0000 mg | ORAL_TABLET | Freq: Every day | ORAL | 0 refills | Status: DC
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
## 2020-10-13 ENCOUNTER — Other Ambulatory Visit: Payer: Self-pay

## 2020-10-13 ENCOUNTER — Encounter: Payer: Self-pay | Admitting: Family Medicine

## 2020-10-13 ENCOUNTER — Ambulatory Visit (INDEPENDENT_AMBULATORY_CARE_PROVIDER_SITE_OTHER): Payer: Medicare HMO | Admitting: Family Medicine

## 2020-10-13 VITALS — BP 120/76 | HR 92 | Temp 97.9°F | Resp 16 | Ht 64.0 in | Wt 199.0 lb

## 2020-10-13 DIAGNOSIS — E1121 Type 2 diabetes mellitus with diabetic nephropathy: Secondary | ICD-10-CM | POA: Diagnosis not present

## 2020-10-13 DIAGNOSIS — I700 Atherosclerosis of aorta: Secondary | ICD-10-CM | POA: Diagnosis not present

## 2020-10-13 DIAGNOSIS — E2839 Other primary ovarian failure: Secondary | ICD-10-CM

## 2020-10-13 DIAGNOSIS — M170 Bilateral primary osteoarthritis of knee: Secondary | ICD-10-CM

## 2020-10-13 DIAGNOSIS — D692 Other nonthrombocytopenic purpura: Secondary | ICD-10-CM | POA: Diagnosis not present

## 2020-10-13 DIAGNOSIS — E538 Deficiency of other specified B group vitamins: Secondary | ICD-10-CM | POA: Diagnosis not present

## 2020-10-13 DIAGNOSIS — I10 Essential (primary) hypertension: Secondary | ICD-10-CM | POA: Diagnosis not present

## 2020-10-13 DIAGNOSIS — E1169 Type 2 diabetes mellitus with other specified complication: Secondary | ICD-10-CM | POA: Diagnosis not present

## 2020-10-13 DIAGNOSIS — E119 Type 2 diabetes mellitus without complications: Secondary | ICD-10-CM

## 2020-10-13 DIAGNOSIS — Z8673 Personal history of transient ischemic attack (TIA), and cerebral infarction without residual deficits: Secondary | ICD-10-CM | POA: Diagnosis not present

## 2020-10-13 DIAGNOSIS — E785 Hyperlipidemia, unspecified: Secondary | ICD-10-CM | POA: Diagnosis not present

## 2020-10-13 LAB — POCT GLYCOSYLATED HEMOGLOBIN (HGB A1C): Hemoglobin A1C: 7 % — AB (ref 4.0–5.6)

## 2020-10-13 MED ORDER — LISINOPRIL 5 MG PO TABS
5.0000 mg | ORAL_TABLET | Freq: Every day | ORAL | 0 refills | Status: DC
Start: 2020-10-13 — End: 2021-02-12

## 2020-10-13 NOTE — Addendum Note (Signed)
Addended by: Carlene Coria on: 10/13/2020 01:51 PM   Modules accepted: Orders

## 2020-10-19 ENCOUNTER — Telehealth: Payer: Self-pay

## 2020-10-19 NOTE — Telephone Encounter (Signed)
Copied from [place] [phone]. Topic: Referral - Status >> Oct 19, 2020  9:18 AM Scherrie Gerlach wrote: Reason for CRM: Melissa with [facility] states the pt wants to get her bone density done there with her mammogram. Can you send the order there instead? Fax:  [phone] (It was sent to [facility] imaging)   Order has been sent to [facility] as requested.  Release ID # [id number]

## 2020-10-31 DIAGNOSIS — R921 Mammographic calcification found on diagnostic imaging of breast: Secondary | ICD-10-CM | POA: Diagnosis not present

## 2020-10-31 DIAGNOSIS — Z780 Asymptomatic menopausal state: Secondary | ICD-10-CM | POA: Diagnosis not present

## 2020-10-31 DIAGNOSIS — Z1231 Encounter for screening mammogram for malignant neoplasm of breast: Secondary | ICD-10-CM | POA: Diagnosis not present

## 2020-10-31 DIAGNOSIS — E2839 Other primary ovarian failure: Secondary | ICD-10-CM | POA: Diagnosis not present

## 2020-10-31 DIAGNOSIS — N951 Menopausal and female climacteric states: Secondary | ICD-10-CM | POA: Diagnosis not present

## 2020-11-10 ENCOUNTER — Telehealth: Payer: Self-pay

## 2020-11-10 DIAGNOSIS — E1121 Type 2 diabetes mellitus with diabetic nephropathy: Secondary | ICD-10-CM

## 2020-11-10 NOTE — Progress Notes (Addendum)
Chronic Care Management Pharmacy Assistant   Name: Christine Roberts  MRN: [id number] DOB: Jun 19, 1946  Reason for Encounter:Diabetes Disease State Call.    Recent office visits:  10/13/2020 Dr Ancil Boozer MD (PCP) Decrease Lisinopril from 10 mg to 5 mg daily.  Recent consult visits:  No recent [facility] visits:  None in previous 6 months  Medications: Outpatient Encounter Medications as of 11/10/2020  Medication Sig Note   ACCU-CHEK SOFTCLIX LANCETS lancets 1 each by Other route daily.    Alcohol Swabs (B-D SINGLE USE SWABS REGULAR) PADS 1 each by Does not apply route daily.    aspirin EC 81 MG tablet Take 1 tablet (81 mg total) by mouth daily.    atorvastatin (LIPITOR) 40 MG tablet TAKE 1 TABLET 2 TIMES A WEEK.    calcium citrate (CALCITRATE - DOSED IN MG ELEMENTAL CALCIUM) 950 MG tablet Take 200 mg of elemental calcium by mouth daily. Has D3 in it as well    Cholecalciferol (VITAMIN D) 125 MCG (5000 UT) CAPS Take by mouth. Pt taking 3 times per week    Coenzyme Q10 (COQ10) 100 MG CAPS Take 1 capsule by mouth daily.    fluticasone (FLONASE) 50 MCG/ACT nasal spray Place 2 sprays into both nostrils daily. 08/22/2020: PRN   glucose blood (ACCU-CHEK AVIVA PLUS) test strip TEST BLOOD SUGAR UP TO FOUR TIMES DAILY AS DIRECTED    insulin degludec (TRESIBA FLEXTOUCH) 200 UNIT/ML FlexTouch Pen Inject 32 Units into the skin daily.    Insulin Pen Needle (NOVOFINE) 30G X 8 MM MISC Inject 10 each into the skin as needed.    lisinopril (ZESTRIL) 5 MG tablet Take 1 tablet (5 mg total) by mouth daily.    metFORMIN (GLUCOPHAGE-XR) 750 MG 24 hr tablet TAKE 1 TABLET (750 MG TOTAL) BY MOUTH DAILY WITH BREAKFAST.    polyethylene glycol powder (GLYCOLAX/MIRALAX) powder Take 17 g by mouth 2 (two) times daily as needed.    Semaglutide,0.25 or 0.5MG /DOS, (OZEMPIC, 0.25 OR 0.5 MG/DOSE,) 2 MG/1.5ML SOPN Inject 0.375 mLs (0.5 mg total) into the skin once a week.    VITAMIN B COMPLEX-C PO Take by mouth.     No facility-administered encounter medications on file as of 11/10/2020.    Star Rating Drugs: Atorvastatin 40 mg last filled on 07/07/2020 for 84 day supply  Lisinopril 5 mg Metformin 750 mg last filled on 08/21/2020 for 90 days supply Ozempic 0.5 mg last filled on 12/24/2019 for 84 day supply at [facility]Approved from [employer]) [name] 32 units daily last filled on 06/13/2020 90 day supply(Approved from [employer]) Recent Relevant Labs: Lab Results  Component Value Date/Time   HGBA1C 7.0 (A) 10/13/2020 01:23 PM   HGBA1C 6.6 (A) 06/13/2020 01:20 PM   HGBA1C 7.7 (H) 11/23/2018 08:05 AM   HGBA1C 9.1 (A) 07/21/2018 11:48 AM   HGBA1C 8.1 (A) 03/30/2018 04:05 PM   MICROALBUR 0.4 02/09/2020 11:17 AM   MICROALBUR <0.2 11/23/2018 08:05 AM   MICROALBUR 20 01/10/2017 01:39 PM   MICROALBUR 20 09/12/2015 03:42 PM    Kidney Function Lab Results  Component Value Date/Time   CREATININE 0.94 (H) 02/09/2020 11:17 AM   CREATININE 0.69 11/23/2018 08:05 AM   GFRNONAA 59 (L) 02/09/2020 11:17 AM   GFRAA 69 02/09/2020 11:17 AM    Current antihyperglycemic regimen:  Metformin XR 750 mg daily  Ozempic 0.5 mg weekly  Tresiba 32 units daily  What recent interventions/DTPs have been made to improve glycemic control:  Patient states she will use 30 units of Tresiba depending on her levels and she will also use 32 units. Have there been any recent hospitalizations or ED visits since last visit with CPP? No Patient denies hypoglycemic symptoms, including Pale, Sweaty, Hungry, Nervous/irritable and Vision changes  Patient reports hyperglycemic symptoms, including polyuria  Patient states she gets up 3 times a night to use the bathroom . Patient reports she has issue with her kidneys and has a follow up with her kidney doctor soon. How often are you checking your blood sugar? once daily What are your blood sugars ranging?  Fasting:  On 11/14/2020 it was 80. On 11/13/2020 it was 98. On  11/12/2020 it was 103. Patient states her blood sugars has been 104,80,90,77 and 79. Before meals: n/a After meals: n/a Bedtime: n/a During the week, how often does your blood glucose drop below 70? Never Are you checking your feet daily/regularly?   Patient denies numbness,pain or tingling sensation in her feet.  Adherence Review: Is the patient currently on a STATIN medication? Yes Is the patient currently on ACE/ARB medication? Yes Does the patient have >5 day gap between last estimated fill dates? Yes  [place] Pharmacist Assistant [phone]    Addendum 11/16/20:  Patient fasting blood sugars 89 mg/dL with two episodes of <80. Recommend decreasing Tresiba to 28 units daily.  Doristine Section, [place] [facility] [phone]

## 2020-11-16 MED ORDER — TRESIBA FLEXTOUCH 200 UNIT/ML ~~LOC~~ SOPN: 28.0000 [IU] | PEN_INJECTOR | Freq: Every day | SUBCUTANEOUS | Status: DC

## 2020-11-16 NOTE — Progress Notes (Addendum)
Left a message to inform patient per clinical pharmacist,patient  fasting readings are on the low side, inform patient  to decrease her Tresiba to 28 units daily. Continue Metformin and Continue Ozempic.  [place] Pharmacist Assistant [phone]

## 2020-11-16 NOTE — Addendum Note (Signed)
Addended by: Daron Offer A on: 11/16/2020 09:11 AM   Modules accepted: Orders

## 2020-11-20 NOTE — Progress Notes (Signed)
Patient return call verbal understanding of the medications change to decrease her Tresiba to 28 units daily, continue metformin and continue Ozempic.  [place] Pharmacist Assistant [phone]

## 2020-12-05 ENCOUNTER — Telehealth: Payer: Self-pay

## 2020-12-05 NOTE — Progress Notes (Signed)
Spoke to patient to confirmed patient telephone appointment on 12/06/2020 for CCM at 10:00 am with [name] the Clinical pharmacist.    Patient verbalized understanding.   [place] Pharmacist Assistant [phone]

## 2020-12-06 ENCOUNTER — Ambulatory Visit (INDEPENDENT_AMBULATORY_CARE_PROVIDER_SITE_OTHER): Payer: Medicare HMO

## 2020-12-06 DIAGNOSIS — E1169 Type 2 diabetes mellitus with other specified complication: Secondary | ICD-10-CM

## 2020-12-06 DIAGNOSIS — E1142 Type 2 diabetes mellitus with diabetic polyneuropathy: Secondary | ICD-10-CM | POA: Diagnosis not present

## 2020-12-06 DIAGNOSIS — E1159 Type 2 diabetes mellitus with other circulatory complications: Secondary | ICD-10-CM | POA: Diagnosis not present

## 2020-12-06 DIAGNOSIS — E785 Hyperlipidemia, unspecified: Secondary | ICD-10-CM

## 2020-12-06 DIAGNOSIS — Z794 Long term (current) use of insulin: Secondary | ICD-10-CM | POA: Diagnosis not present

## 2020-12-06 DIAGNOSIS — I152 Hypertension secondary to endocrine disorders: Secondary | ICD-10-CM

## 2020-12-06 NOTE — Patient Instructions (Signed)
Visit Information It was great speaking with you today!  Please let me know if you have any questions about our visit.  Goals Addressed            This Visit's Progress   . Monitor and Manage My Blood Sugar-Diabetes Type 2       Timeframe:  Long-Range Goal Priority:  High Start Date: 12/06/2020                            Expected End Date: 06/07/2021                      Follow Up Date 02/03/2021   - check blood sugar at prescribed times - check blood sugar if I feel it is too high or too low - take the blood sugar log to all doctor visits    Why is this important?    Checking your blood sugar at home helps to keep it from getting very high or very low.   Writing the results in a diary or log helps the doctor know how to care for you.   Your blood sugar log should have the time, date and the results.   Also, write down the amount of insulin or other medicine that you take.   Other information, like what you ate, exercise done and how you were feeling, will also be helpful.     Notes:        Patient Care Plan: General Pharmacy (Adult)    Problem Identified: Hypertension, Hyperlipidemia, Diabetes, Coronary Artery Disease, Osteoarthritis and History of TIA   Priority: High    Long-Range Goal: Patient-Specific Goal   Start Date: 12/06/2020  Expected End Date: 06/07/2021  This Visit's Progress: On track  Priority: High  Note:   Current Barriers:  . Unable to independently afford treatment regimen  Pharmacist Clinical Goal(s):  Marland Kitchen Patient will verbalize ability to afford treatment regimen . maintain control of diabetes as evidenced by A1c less than 8%  through collaboration with PharmD and provider.   Interventions: . 1:1 collaboration with Steele Sizer, MD regarding development and update of comprehensive plan of care as evidenced by provider attestation and co-signature . Inter-disciplinary care team collaboration (see longitudinal plan of care) . Comprehensive  medication review performed; medication list updated in electronic medical record  Hypertension (BP goal <140/90) -Controlled -Current treatment: . Lisinopril 5 mg daily  -Medications previously tried: NA  -Current home readings: Does not monitor -Current dietary habits: Does not eat often after 5pm. Largest meal is around 2pm, will have a small snack in the evening.  -Current exercise habits: [employer] work, previously was walking but not recently.  -increased stress, daughter diagnosed with lung cancer.  -Denies hypotensive/hypertensive symptoms -Educated on Importance of home blood pressure monitoring; Proper BP monitoring technique; -Counseled to monitor BP at home weekly, document, and provide log at future appointments -Recommended to continue current medication  Hyperlipidemia: (LDL goal < 70) -Controlled  -History of TIA  -Current treatment: . Atorvastatin 40 mg twice weekly  -Current antiplatelet treatment: . Aspirin 81 mg daily -Medications previously tried: NA  -Myalgias managable, continues to take CoQ10 supplementation which she feels helps her manage some of her symptoms. -Educated on Benefits of statin for ASCVD risk reduction; -Recommended to continue current medication  Diabetes (A1c goal <8%) -Controlled -Current medications: Marland Kitchen Metformin XR 750 mg daily  . Ozempic 0.5 mg weekly  . Tresiba 30  units daily  -Medications previously tried: NA  -Current home glucose readings . fasting glucose: 134, 106, 117, 120, 97, 96, 98, 80 -Patient self-titrated Tresiba back from 28 units to 30 units daily because she was worried about elevated blood sugars. Counseled patient on avoiding rapid insulin titrations unless she sees multiple days where her blood sugar is >150 or <90.  -Denies hypoglycemic/hyperglycemic symptoms -Educated on A1c and blood sugar goals; Prevention and management of hypoglycemic episodes;  -Recommended Tresiba 30 units daily  Patient Goals/Self-Care  Activities . Patient will:  - check glucose daily, document, and provide at future appointments check blood pressure weekly, document, and provide at future appointments  Follow Up Plan: Telephone follow up appointment with care management team member scheduled for:  04/11/2021 at 9:00 AM      Patient agreed to services and verbal consent obtained.   The patient verbalized understanding of instructions, educational materials, and care plan provided today and declined offer to receive copy of patient instructions, educational materials, and care plan.   Doristine Section, [place] [facility] [phone]

## 2020-12-06 NOTE — Progress Notes (Signed)
Chronic Care Management Pharmacy Note  12/06/2020 Name:  Christine Roberts MRN:  [id number] DOB:  02-02-1946  Summary: Patient doing well overall, she has been continuing to monitor her blood sugars and decided to resume her previous dose of Tresiba 30 units daily. She has been noticing more stress recently after her daughter was diagnosed with lung cancer.  Recommendations/Changes made from today's visit: Increase Tresiba from 28 to 30 units daily   Subjective: Christine Roberts is an 75 y.o. year old female who is a primary patient of Steele Sizer, MD.  The CCM team was consulted for assistance with disease management and care coordination needs.    Engaged with patient by telephone for follow up visit in response to provider referral for pharmacy case management and/or care coordination services.   Consent to Services:  The patient was given information about Chronic Care Management services, agreed to services, and gave verbal consent prior to initiation of services.  Please see initial visit note for detailed documentation.   Patient Care Team: Steele Sizer, MD as PCP - General (Family Medicine) Dingeldein, Remo Lipps, MD as Consulting Physician (Ophthalmology) Randa Spike, MD as Consulting Physician (Dermatology) Billey Co, MD as Consulting Physician (Urology) Efrain Sella, MD as Consulting Physician (Gastroenterology) Herbert Pun, MD as Consulting Physician (General Surgery) Germaine Pomfret, [facility] (Pharmacist)  Recent office visits: 10/13/2020: Patient presented to Dr. Ancil Boozer for follow-up. BP 120/76. Lisinopril decreased to 5 mg daily. A1c worsened slightly to 7.0%.  06/13/20: Patient presented to Dr. Ancil Boozer for follow-up. A1c 6.6%. Tresiba decreased to 32 units nightly.  Recent consult visits: None in previous 6 months  Hospital visits: None in previous 6 months   Objective:  Lab Results  Component Value Date   CREATININE 0.94 (H) 02/09/2020   BUN  16 02/09/2020   GFRNONAA 59 (L) 02/09/2020   GFRAA 69 02/09/2020   NA 140 02/09/2020   K 4.3 02/09/2020   CALCIUM 9.9 02/09/2020   CO2 26 02/09/2020   GLUCOSE 74 02/09/2020    Lab Results  Component Value Date/Time   HGBA1C 7.0 (A) 10/13/2020 01:23 PM   HGBA1C 6.6 (A) 06/13/2020 01:20 PM   HGBA1C 7.7 (H) 11/23/2018 08:05 AM   HGBA1C 9.1 (A) 07/21/2018 11:48 AM   HGBA1C 8.1 (A) 03/30/2018 04:05 PM   MICROALBUR 0.4 02/09/2020 11:17 AM   MICROALBUR <0.2 11/23/2018 08:05 AM   MICROALBUR 20 01/10/2017 01:39 PM   MICROALBUR 20 09/12/2015 03:42 PM    Last diabetic Eye exam:  Lab Results  Component Value Date/Time   HMDIABEYEEXA No Retinopathy 04/01/2018 12:00 AM    Last diabetic Foot exam: No results found for: HMDIABFOOTEX   Lab Results  Component Value Date   CHOL 152 02/09/2020   HDL 57 02/09/2020   LDLCALC 76 02/09/2020   TRIG 104 02/09/2020   CHOLHDL 2.7 02/09/2020    Hepatic Function Latest Ref Rng & Units 02/09/2020 11/23/2018 05/25/2018  Total Protein 6.1 - 8.1 g/dL 7.0 6.3 6.6  Albumin 3.5 - 5.0 g/dL - - -  AST 10 - 35 U/L 13 11 13   ALT 6 - 29 U/L 11 10 14   Alk Phosphatase 38 - 126 U/L - - -  Total Bilirubin 0.2 - 1.2 mg/dL 0.5 0.4 0.5    Lab Results  Component Value Date/Time   TSH 1.290 04/04/2015 08:10 AM    CBC Latest Ref Rng & Units 02/09/2020 11/23/2018 05/25/2018  WBC 3.8 - 10.8 Thousand/uL 9.0 7.4 8.7  Hemoglobin 11.7 - 15.5 g/dL 12.4 12.0 11.7  Hematocrit 35.0 - 45.0 % 38.4 36.1 35.0  Platelets 140 - 400 Thousand/uL 299 272 293    Lab Results  Component Value Date/Time   VD25OH 34.4 04/04/2015 08:10 AM    Clinical ASCVD: Yes  The 10-year ASCVD risk score Mikey Bussing DC Jr., et al., 2013) is: 35.9%   Values used to calculate the score:     Age: 75 years     Sex: Female     Is Non-Hispanic African American: No     Diabetic: Yes     Tobacco smoker: No     Systolic Blood Pressure: 967 mmHg     Is BP treated: Yes     HDL Cholesterol: 57 mg/dL      Total Cholesterol: 152 mg/dL    Depression screen [facility] 2/9 10/13/2020 08/22/2020 06/13/2020  Decreased Interest 0 0 0  Down, Depressed, Hopeless 0 0 1  PHQ - 2 Score 0 0 1  Altered sleeping - - 0  Tired, decreased energy - - 1  Change in appetite - - 0  Feeling bad or failure about yourself  - - 0  Trouble concentrating - - 0  Moving slowly or fidgety/restless - - 0  Suicidal thoughts - - 0  PHQ-9 Score - - 2  Difficult doing work/chores - - -  Some recent data might be hidden    Social History   Tobacco Use  Smoking Status Never Smoker  Smokeless Tobacco Never Used  Tobacco Comment   smoking cessation materials not required   BP Readings from Last 3 Encounters:  10/13/20 120/76  08/22/20 128/72  06/13/20 122/76   Pulse Readings from Last 3 Encounters:  10/13/20 92  08/22/20 95  06/13/20 79   Wt Readings from Last 3 Encounters:  10/13/20 199 lb (90.3 kg)  08/22/20 197 lb 12.8 oz (89.7 kg)  06/13/20 195 lb 4.8 oz (88.6 kg)   BMI Readings from Last 3 Encounters:  10/13/20 34.16 kg/m  08/22/20 33.95 kg/m  06/13/20 33.52 kg/m    Assessment/Interventions: Review of patient past medical history, allergies, medications, health status, including review of consultants reports, laboratory and other test data, was performed as part of comprehensive evaluation and provision of chronic care management services.   SDOH:  (Social Determinants of Health) assessments and interventions performed: Yes SDOH Interventions   Flowsheet Row Most Recent Value  SDOH Interventions   Financial Strain Interventions Intervention Not Indicated     SDOH Screenings   Alcohol Screen: Low Risk   . Last Alcohol Screening Score (AUDIT): 2  Depression (PHQ2-9): Low Risk   . PHQ-2 Score: 0  Financial Resource Strain: Low Risk   . Difficulty of Paying Living Expenses: Not hard at all  Food Insecurity: No Food Insecurity  . Worried About Charity fundraiser in the Last Year: Never true  . Ran  Out of Food in the Last Year: Never true  Housing: Low Risk   . Last Housing Risk Score: 0  Physical Activity: Insufficiently Active  . Days of Exercise per Week: 3 days  . Minutes of Exercise per Session: 30 min  Social Connections: Moderately Isolated  . Frequency of Communication with Friends and Family: More than three times a week  . Frequency of Social Gatherings with Friends and Family: Three times a week  . Attends Religious Services: More than 4 times per year  . Active Member of Clubs or Organizations: No  . Attends  Club or Organization Meetings: Never  . Marital Status: Widowed  Stress: No Stress Concern Present  . Feeling of Stress : Only a little  Tobacco Use: Low Risk   . Smoking Tobacco Use: Never Smoker  . Smokeless Tobacco Use: Never Used  Transportation Needs: No Transportation Needs  . Lack of Transportation (Medical): No  . Lack of Transportation (Non-Medical): No    CCM Care Plan  Allergies  Allergen Reactions  . Latex Rash  . Erythromycin Other (See Comments)    Strawberry tongue    Medications Reviewed Today    Reviewed by Steele Sizer, MD (Physician) on 10/13/20 at 1340  Med List Status: <None>  Medication Order Taking? Sig Documenting Provider Last Dose Status Informant  ACCU-CHEK SOFTCLIX LANCETS lancets [id number] Yes 1 each by Other route daily. Steele Sizer, MD Taking Active   Alcohol Swabs (B-D SINGLE USE SWABS REGULAR) PADS [id number] Yes 1 each by Does not apply route daily. Steele Sizer, MD Taking Active   aspirin EC 81 MG tablet [id number] Yes Take 1 tablet (81 mg total) by mouth daily. Steele Sizer, MD Taking Active Self  atorvastatin (LIPITOR) 40 MG tablet [id number] Yes TAKE 1 TABLET 2 TIMES A WEEK. Steele Sizer, MD Taking Active   calcium citrate (CALCITRATE - DOSED IN MG ELEMENTAL CALCIUM) 950 MG tablet [id number] Yes Take 200 mg of elemental calcium by mouth daily. Has D3 in it as well [provider] Taking Active Self   Cholecalciferol (VITAMIN D) 125 MCG (5000 UT) CAPS [id number] Yes Take by mouth. Pt taking 3 times per week [provider] Taking Active   Coenzyme Q10 (COQ10) 100 MG CAPS [id number] Yes Take 1 capsule by mouth daily. [provider] Taking Active   fluticasone (FLONASE) 50 MCG/ACT nasal spray [id number] Yes Place 2 sprays into both nostrils daily. Steele Sizer, MD Taking Active            Med Note Clemetine Marker D   Tue Aug 22, 2020  3:17 PM) PRN  glucose blood (ACCU-CHEK AVIVA PLUS) test strip [id number] Yes TEST BLOOD SUGAR UP TO FOUR TIMES DAILY AS DIRECTED Steele Sizer, MD Taking Active   insulin degludec (TRESIBA FLEXTOUCH) 200 UNIT/ML FlexTouch Pen [id number] Yes Inject 32 Units into the skin daily. Steele Sizer, MD Taking Active   Insulin Pen Needle (NOVOFINE) 30G X 8 MM MISC [id number] Yes Inject 10 each into the skin as needed. Steele Sizer, MD Taking Active         Discontinued 10/13/20 1340 (Change in therapy)   metFORMIN (GLUCOPHAGE-XR) 750 MG 24 hr tablet [id number] Yes TAKE 1 TABLET (750 MG TOTAL) BY MOUTH DAILY WITH BREAKFAST. Steele Sizer, MD Taking Active   polyethylene glycol powder [facility]) powder [id number] Yes Take 17 g by mouth 2 (two) times daily as needed. Steele Sizer, MD Taking Active   Semaglutide,0.25 or 0.5MG /DOS, (OZEMPIC, 0.25 OR 0.5 MG/DOSE,) 2 MG/1.5ML SOPN [id number] Yes Inject 0.375 mLs (0.5 mg total) into the skin once a week. Steele Sizer, MD Taking Active   VITAMIN B COMPLEX-C PO [id number] Yes Take by mouth. [provider] Taking Active           Patient Active Problem List   Diagnosis Date Noted  . Atherosclerosis of aorta ([place]) 05/25/2018  . Seasonal allergic rhinitis 09/12/2015  . TIA (transient ischemic attack) 05/24/2015  . Senile purpura ([place]) 05/09/2015  . Benign essential HTN 03/18/2015  . Pain in shoulder 03/18/2015  . Dyslipidemia 03/18/2015  . History of surgery  to major organs, presenting  hazards to health 03/18/2015  . Hearing loss 03/18/2015  . Hiatal hernia 03/18/2015  . History of colon polyps 03/18/2015  . Calculus of kidney 03/18/2015  . B12 deficiency 03/18/2015  . Microalbuminuria 03/18/2015  . Adult BMI 30+ 03/18/2015  . Arthritis, degenerative 03/18/2015  . Diabetes mellitus with nephropathy ([place]) 03/18/2015    Immunization History  Administered Date(s) Administered  . Fluad Quad(high Dose 65+) 03/24/2019  . Influenza Split 04/11/2009  . Influenza, High Dose Seasonal PF 03/21/2015, 05/10/2016, 03/30/2017, 03/30/2018  . Influenza, Seasonal, Injecte, Preservative Fre 04/07/2012  . Influenza,inj,Quad PF,6+ Mos 03/07/2014  . Influenza-Unspecified 03/07/2014, 03/30/2017, 05/15/2020  . PFIZER(Purple Top)SARS-COV-2 Vaccination 07/15/2019, 08/05/2019, 06/27/2020  . Pneumococcal Conjugate-13 03/21/2015  . Pneumococcal Polysaccharide-23 10/25/2009  . Tdap 10/25/2009, 03/14/2018  . Zoster, Live 09/24/2011    Conditions to be addressed/monitored:  Hypertension, Hyperlipidemia, Diabetes, Coronary Artery Disease, Osteoarthritis and History of TIA  Care Plan : General Pharmacy (Adult)  Updates made by Germaine Pomfret, RPH since 12/06/2020 12:00 AM    Problem: Hypertension, Hyperlipidemia, Diabetes, Coronary Artery Disease, Osteoarthritis and History of TIA   Priority: High    Long-Range Goal: Patient-Specific Goal   Start Date: 12/06/2020  Expected End Date: 06/07/2021  This Visit's Progress: On track  Priority: High  Note:   Current Barriers:  . Unable to independently afford treatment regimen  Pharmacist Clinical Goal(s):  Marland Kitchen Patient will verbalize ability to afford treatment regimen . maintain control of diabetes as evidenced by A1c less than 8%  through collaboration with PharmD and provider.   Interventions: . 1:1 collaboration with Steele Sizer, MD regarding development and update of comprehensive plan of care as evidenced by provider attestation  and co-signature . Inter-disciplinary care team collaboration (see longitudinal plan of care) . Comprehensive medication review performed; medication list updated in electronic medical record  Hypertension (BP goal <140/90) -Controlled -Current treatment: . Lisinopril 5 mg daily  -Medications previously tried: NA  -Current home readings: Does not monitor -Current dietary habits: Does not eat often after 5pm. Largest meal is around 2pm, will have a small snack in the evening.  -Current exercise habits: [employer] work, previously was walking but not recently.  -increased stress, daughter diagnosed with lung cancer.  -Denies hypotensive/hypertensive symptoms -Educated on Importance of home blood pressure monitoring; Proper BP monitoring technique; -Counseled to monitor BP at home weekly, document, and provide log at future appointments -Recommended to continue current medication  Hyperlipidemia: (LDL goal < 70) -Controlled  -History of TIA  -Current treatment: . Atorvastatin 40 mg twice weekly  -Current antiplatelet treatment: . Aspirin 81 mg daily -Medications previously tried: NA  -Myalgias managable, continues to take CoQ10 supplementation which she feels helps her manage some of her symptoms. -Educated on Benefits of statin for ASCVD risk reduction; -Recommended to continue current medication  Diabetes (A1c goal <8%) -Controlled -Current medications: Marland Kitchen Metformin XR 750 mg daily  . Ozempic 0.5 mg weekly  . Tresiba 30 units daily  -Medications previously tried: NA  -Current home glucose readings . fasting glucose: 134, 106, 117, 120, 97, 96, 98, 80 -Patient self-titrated Tresiba back from 28 units to 30 units daily because she was worried about elevated blood sugars. Counseled patient on avoiding rapid insulin titrations unless she sees multiple days where her blood sugar is >150 or <90.  -Denies hypoglycemic/hyperglycemic symptoms -Educated on A1c and blood sugar  goals; Prevention and management of hypoglycemic episodes;  -Recommended Tresiba 30 units daily  Patient Goals/Self-Care Activities .  Patient will:  - check glucose daily, document, and provide at future appointments check blood pressure weekly, document, and provide at future appointments  Follow Up Plan: Telephone follow up appointment with care management team member scheduled for:  04/11/2021 at 9:00 AM      Medication Assistance: Ozempic and Tresibaobtained through [employer] medication assistance program.  Enrollment ends Dec 2022  Compliance/Adherence/Medication fill history: Care Gaps: Shingrix Ophthalmology Exam  Star-Rating Drugs: Atorvastatin 40 mg twice weekly: Last filled 10/05/20 for 84-DS Lisinopril 5 mg daily: Last filled 10/06/20 for 90-DS Metformin XR 750 mg daily: Last filled 08/21/20 and 06/14/20 for 90-DS.   Patient's preferred pharmacy is:  [facility], [address] [place] [address] Phone: [phone] Fax: [phone]  [place], [place] - [address] [place] [address] Phone: [phone] Fax: [phone]  Uses pill box? Yes Pt endorses 100% compliance  We discussed: Current pharmacy is preferred with insurance plan and patient is satisfied with pharmacy services Patient decided to: Continue current medication management strategy  Care Plan and Follow Up Patient Decision:  Patient agrees to Care Plan and Follow-up.  Plan: Telephone follow up appointment with care management team member scheduled for:  04/11/2021 at 9:00 AM  Doristine Section, [facility] [phone]

## 2021-01-04 ENCOUNTER — Other Ambulatory Visit: Payer: Self-pay | Admitting: Family Medicine

## 2021-01-04 DIAGNOSIS — E1121 Type 2 diabetes mellitus with diabetic nephropathy: Secondary | ICD-10-CM

## 2021-01-04 DIAGNOSIS — I10 Essential (primary) hypertension: Secondary | ICD-10-CM

## 2021-01-10 ENCOUNTER — Telehealth: Payer: Self-pay

## 2021-01-10 NOTE — Progress Notes (Signed)
Chronic Care Management Pharmacy Assistant   Name: Christine Roberts  MRN: [id number] DOB: 05-06-1946  Reason for Encounter:Diabetes and Hypertension Disease State Call.   Recent office visits:  No recent Office Visit  Recent consult visits:  No recent [facility] visits:  None in previous 6 months  Medications: Outpatient Encounter Medications as of 01/10/2021  Medication Sig Note   ACCU-CHEK SOFTCLIX LANCETS lancets 1 each by Other route daily.    Alcohol Swabs (B-D SINGLE USE SWABS REGULAR) PADS 1 each by Does not apply route daily.    aspirin EC 81 MG tablet Take 1 tablet (81 mg total) by mouth daily.    atorvastatin (LIPITOR) 40 MG tablet TAKE 1 TABLET 2 TIMES A WEEK.    calcium citrate (CALCITRATE - DOSED IN MG ELEMENTAL CALCIUM) 950 MG tablet Take 200 mg of elemental calcium by mouth daily. Has D3 in it as well    Cholecalciferol (VITAMIN D) 125 MCG (5000 UT) CAPS Take by mouth. Pt taking 3 times per week    Coenzyme Q10 (COQ10) 100 MG CAPS Take 1 capsule by mouth daily.    fluticasone (FLONASE) 50 MCG/ACT nasal spray Place 2 sprays into both nostrils daily. 08/22/2020: PRN   glucose blood (ACCU-CHEK AVIVA PLUS) test strip TEST BLOOD SUGAR UP TO FOUR TIMES DAILY AS DIRECTED    insulin degludec (TRESIBA FLEXTOUCH) 200 UNIT/ML FlexTouch Pen Inject 28 Units into the skin daily.    Insulin Pen Needle (NOVOFINE) 30G X 8 MM MISC Inject 10 each into the skin as needed.    lisinopril (ZESTRIL) 5 MG tablet Take 1 tablet (5 mg total) by mouth daily.    metFORMIN (GLUCOPHAGE-XR) 750 MG 24 hr tablet TAKE 1 TABLET (750 MG TOTAL) BY MOUTH DAILY WITH BREAKFAST.    polyethylene glycol powder (GLYCOLAX/MIRALAX) powder Take 17 g by mouth 2 (two) times daily as needed.    Semaglutide,0.25 or 0.5MG /DOS, (OZEMPIC, 0.25 OR 0.5 MG/DOSE,) 2 MG/1.5ML SOPN Inject 0.375 mLs (0.5 mg total) into the skin once a week.    VITAMIN B COMPLEX-C PO Take by mouth.    No facility-administered encounter  medications on file as of 01/10/2021.    Care Gaps: Per patient chart, Patient is due for the following care gaps:Shingrix vaccine, COVID 19 Vaccine and Ophthalmology Exam. Star Rating Drugs: Atorvastatin 40 mg last filled 10/05/2020 for 84 day supply Lisinopril 5 mg Metformin 750 mg last filled 10/28/2020 for 90 days supply Ozempic 0.5 mg  (Approved from [employer]) [name] 30 units daily (Approved from [employer])  Reviewed chart prior to disease state call. Spoke with patient regarding BP  Recent Office Vitals: BP Readings from Last 3 Encounters:  10/13/20 120/76  08/22/20 128/72  06/13/20 122/76   Pulse Readings from Last 3 Encounters:  10/13/20 92  08/22/20 95  06/13/20 79    Wt Readings from Last 3 Encounters:  10/13/20 199 lb (90.3 kg)  08/22/20 197 lb 12.8 oz (89.7 kg)  06/13/20 195 lb 4.8 oz (88.6 kg)     Kidney Function Lab Results  Component Value Date/Time   CREATININE 0.94 (H) 02/09/2020 11:17 AM   CREATININE 0.69 11/23/2018 08:05 AM   GFRNONAA 59 (L) 02/09/2020 11:17 AM   GFRAA 69 02/09/2020 11:17 AM    BMP Latest Ref Rng & Units 02/09/2020 11/23/2018 05/25/2018  Glucose 65 - 99 mg/dL 74 119(H) 104  BUN 7 - 25 mg/dL 16 13 13   Creatinine 0.60 - 0.93 mg/dL 0.94(H)  0.69 0.79  BUN/Creat Ratio 6 - 22 (calc) 17 NOT APPLICABLE NOT APPLICABLE  Sodium 924 - 146 mmol/L 140 140 140  Potassium 3.5 - 5.3 mmol/L 4.3 4.2 4.2  Chloride 98 - 110 mmol/L 104 106 103  CO2 20 - 32 mmol/L 26 28 27   Calcium 8.6 - 10.4 mg/dL 9.9 9.1 10.0    Current antihypertensive regimen:  Lisinopril 5 mg daily  Patient reports she has symptoms of dizziness from time to time. How often are you checking your Blood Pressure? daily Current home BP readings:  On 01/10/2021 it was 98/55 am and 127/70 around lunch.Patient states is concern her blood pressure was 98/55.Patient states she was dizzy.Patient reports her blood came back up later in the day. 268/34,196/22 What recent  interventions/DTPs have been made by any provider to improve Blood Pressure control since last CPP Visit: None ID Any recent hospitalizations or ED visits since last visit with CPP? No What diet changes have been made to improve Blood Pressure Control?  None ID What exercise is being done to improve your Blood Pressure Control?  Patient reports she works in her yard depending on the weather, and she takes care of her goats.  Recent Relevant Labs: Lab Results  Component Value Date/Time   HGBA1C 7.0 (A) 10/13/2020 01:23 PM   HGBA1C 6.6 (A) 06/13/2020 01:20 PM   HGBA1C 7.7 (H) 11/23/2018 08:05 AM   HGBA1C 9.1 (A) 07/21/2018 11:48 AM   HGBA1C 8.1 (A) 03/30/2018 04:05 PM   MICROALBUR 0.4 02/09/2020 11:17 AM   MICROALBUR <0.2 11/23/2018 08:05 AM   MICROALBUR 20 01/10/2017 01:39 PM   MICROALBUR 20 09/12/2015 03:42 PM    Kidney Function Lab Results  Component Value Date/Time   CREATININE 0.94 (H) 02/09/2020 11:17 AM   CREATININE 0.69 11/23/2018 08:05 AM   GFRNONAA 59 (L) 02/09/2020 11:17 AM   GFRAA 69 02/09/2020 11:17 AM    Current antihyperglycemic regimen:  Metformin XR 750 mg daily Ozempic 0.5 mg weekly Tresiba 30 units daily What recent interventions/DTPs have been made to improve glycemic control:  None ID Have there been any recent hospitalizations or ED visits since last visit with CPP? No Patient reports hypoglycemic symptoms, including Shaky Patient states she has symptoms of being shaky due to age not diabetes Patient reports hyperglycemic symptoms, including polyuria and weakness How often are you checking your blood sugar? once daily What are your blood sugars ranging?  Fasting: 114,104,79,123,103,108,85 During the week, how often does your blood glucose drop below 70? Never Are you checking your feet daily/regularly?   Patient denies numbness,pain, or tingling in the bottom of her feet.  Patient states on the 4th of July she experience a hot flash where it felt like  she was on fire, with symptoms of weakness, dizziness,swimmy headed, face was red.Patient reports this happen in the morning after she had her coffee lasting between 5-6 minutes maybe more.Patient states she already went through menopause, and she  is concern if she may be having a side effect from one of her medications. Patient states she is unsure what was wrong with her.Notified Clinical Pharmacist.  Per Clinical Pharmacist, unsure if that instance was a side effect of her medications. She should continue to monitor and schedule a visit if the hot flashes happen more frequently. She should make sure she is staying hydrated with water throughout the day  Patient verbalized understanding. Patient states she will schedule appointment with her PCP if it happens again before her appointment  she has on 02/12/2021 with her PCP.Patient states she will discuss this with her PCP as well on her Follow up on 02/12/2021.  Adherence Review: Is the patient currently on a STATIN medication? Yes Is the patient currently on ACE/ARB medication? Yes Does the patient have >5 day gap between last estimated fill dates? No   Anderson Malta Clinical Production designer, theatre/television/film [phone]

## 2021-01-24 ENCOUNTER — Other Ambulatory Visit: Payer: Self-pay | Admitting: Family Medicine

## 2021-01-24 DIAGNOSIS — E1121 Type 2 diabetes mellitus with diabetic nephropathy: Secondary | ICD-10-CM
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
## 2021-02-12 ENCOUNTER — Other Ambulatory Visit: Payer: Self-pay

## 2021-02-12 ENCOUNTER — Encounter: Payer: Self-pay | Admitting: Family Medicine

## 2021-02-12 ENCOUNTER — Ambulatory Visit (INDEPENDENT_AMBULATORY_CARE_PROVIDER_SITE_OTHER): Payer: Medicare HMO | Admitting: Family Medicine

## 2021-02-12 VITALS — BP 130/80 | HR 98 | Temp 98.2°F | Resp 16 | Ht 64.0 in | Wt 199.0 lb

## 2021-02-12 DIAGNOSIS — Z8673 Personal history of transient ischemic attack (TIA), and cerebral infarction without residual deficits: Secondary | ICD-10-CM

## 2021-02-12 DIAGNOSIS — E669 Obesity, unspecified: Secondary | ICD-10-CM

## 2021-02-12 DIAGNOSIS — D692 Other nonthrombocytopenic purpura: Secondary | ICD-10-CM | POA: Diagnosis not present

## 2021-02-12 DIAGNOSIS — Z794 Long term (current) use of insulin: Secondary | ICD-10-CM

## 2021-02-12 DIAGNOSIS — E1159 Type 2 diabetes mellitus with other circulatory complications: Secondary | ICD-10-CM | POA: Diagnosis not present

## 2021-02-12 DIAGNOSIS — I10 Essential (primary) hypertension: Secondary | ICD-10-CM | POA: Diagnosis not present

## 2021-02-12 DIAGNOSIS — E1121 Type 2 diabetes mellitus with diabetic nephropathy: Secondary | ICD-10-CM

## 2021-02-12 DIAGNOSIS — E1142 Type 2 diabetes mellitus with diabetic polyneuropathy: Secondary | ICD-10-CM | POA: Diagnosis not present

## 2021-02-12 DIAGNOSIS — E785 Hyperlipidemia, unspecified: Secondary | ICD-10-CM | POA: Diagnosis not present

## 2021-02-12 DIAGNOSIS — I700 Atherosclerosis of aorta: Secondary | ICD-10-CM

## 2021-02-12 DIAGNOSIS — E538 Deficiency of other specified B group vitamins: Secondary | ICD-10-CM

## 2021-02-12 DIAGNOSIS — E1169 Type 2 diabetes mellitus with other specified complication: Secondary | ICD-10-CM

## 2021-02-12 DIAGNOSIS — I152 Hypertension secondary to endocrine disorders: Secondary | ICD-10-CM

## 2021-02-12 LAB — POCT GLYCOSYLATED HEMOGLOBIN (HGB A1C): Hemoglobin A1C: 7.2 % — AB (ref 4.0–5.6)

## 2021-02-12 MED ORDER — LISINOPRIL 5 MG PO TABS: 5.0000 mg | ORAL_TABLET | Freq: Every day | ORAL | 1 refills | Status: DC

## 2021-02-12 MED ORDER — METFORMIN HCL ER 750 MG PO TB24: ORAL_TABLET | ORAL | 1 refills | Status: DC

## 2021-02-13 LAB — COMPLETE METABOLIC PANEL WITH GFR
AG Ratio: 1.5 (calc) (ref 1.0–2.5)
ALT: 13 U/L (ref 6–29)
AST: 14 U/L (ref 10–35)
Albumin: 4 g/dL (ref 3.6–5.1)
Alkaline phosphatase (APISO): 69 U/L (ref 37–153)
BUN: 13 mg/dL (ref 7–25)
CO2: 27 mmol/L (ref 20–32)
Calcium: 9.2 mg/dL (ref 8.6–10.4)
Chloride: 106 mmol/L (ref 98–110)
Creat: 0.78 mg/dL (ref 0.60–1.00)
Globulin: 2.6 g/dL (calc) (ref 1.9–3.7)
Glucose, Bld: 62 mg/dL — ABNORMAL LOW (ref 65–99)
Potassium: 3.9 mmol/L (ref 3.5–5.3)
Sodium: 142 mmol/L (ref 135–146)
Total Bilirubin: 0.5 mg/dL (ref 0.2–1.2)
Total Protein: 6.6 g/dL (ref 6.1–8.1)
eGFR: 79 mL/min/{1.73_m2} (ref >=60)

## 2021-02-13 LAB — CBC WITH DIFFERENTIAL/PLATELET
Absolute Monocytes: 663 cells/uL (ref 200–950)
Basophils Absolute: 30 cells/uL (ref 0–200)
Basophils Relative: 0.3 %
Eosinophils Absolute: 139 cells/uL (ref 15–500)
Eosinophils Relative: 1.4 %
HCT: 37.4 % (ref 35.0–45.0)
Hemoglobin: 12.2 g/dL (ref 11.7–15.5)
Lymphs Abs: 3307 cells/uL (ref 850–3900)
MCH: 27.4 pg (ref 27.0–33.0)
MCHC: 32.6 g/dL (ref 32.0–36.0)
MCV: 84 fL (ref 80.0–100.0)
MPV: 9.6 fL (ref 7.5–12.5)
Monocytes Relative: 6.7 %
Neutro Abs: 5762 cells/uL (ref 1500–7800)
Neutrophils Relative %: 58.2 %
Platelets: 324 10*3/uL (ref 140–400)
RBC: 4.45 10*6/uL (ref 3.80–5.10)
RDW: 13.3 % (ref 11.0–15.0)
Total Lymphocyte: 33.4 %
WBC: 9.9 10*3/uL (ref 3.8–10.8)

## 2021-02-13 LAB — LIPID PANEL
Cholesterol: 147 mg/dL (ref <200)
HDL: 63 mg/dL (ref >=50)
LDL Cholesterol (Calc): 68 mg/dL (calc)
Non-HDL Cholesterol (Calc): 84 mg/dL (calc) (ref <130)
Total CHOL/HDL Ratio: 2.3 (calc) (ref <5.0)
Triglycerides: 82 mg/dL (ref <150)

## 2021-02-13 LAB — MICROALBUMIN / CREATININE URINE RATIO
Creatinine, Urine: 182 mg/dL (ref 20–275)
Microalb Creat Ratio: 2 mcg/mg creat (ref <30)
Microalb, Ur: 0.3 mg/dL

## 2021-03-08 ENCOUNTER — Telehealth: Payer: Self-pay
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
## 2021-03-14 ENCOUNTER — Other Ambulatory Visit: Payer: Self-pay | Admitting: Family Medicine

## 2021-03-14 DIAGNOSIS — E785 Hyperlipidemia, unspecified: Secondary | ICD-10-CM

## 2021-03-15 ENCOUNTER — Ambulatory Visit (INDEPENDENT_AMBULATORY_CARE_PROVIDER_SITE_OTHER): Payer: Medicare HMO | Admitting: Nurse Practitioner

## 2021-03-15 ENCOUNTER — Other Ambulatory Visit: Payer: Self-pay

## 2021-03-15 ENCOUNTER — Encounter: Payer: Self-pay | Admitting: Nurse Practitioner

## 2021-03-15 VITALS — BP 114/76 | HR 97 | Temp 98.0°F | Resp 16 | Ht 64.0 in | Wt 197.6 lb

## 2021-03-15 DIAGNOSIS — H60501 Unspecified acute noninfective otitis externa, right ear: Secondary | ICD-10-CM | POA: Diagnosis not present

## 2021-03-15 MED ORDER — OFLOXACIN 0.3 % OT SOLN: 5.0000 [drp] | Freq: Every day | OTIC | 0 refills | Status: AC

## 2021-03-15 NOTE — Progress Notes (Signed)
BP 114/76   Pulse 97   Temp 98 F (36.7 C)   Resp 16   Ht $R'5\' 4"'Er$  (1.626 m)   Wt 197 lb 9.6 oz (89.6 kg)   SpO2 96%   BMI 33.92 kg/m    Subjective:    Patient ID: Christine Roberts, female    DOB: 02-Apr-1946, 75 y.o.   MRN: [id number]  HPI: Christine Roberts is a 75 y.o. female, here alone.  Right Ear Pain:  She says that her right ear has been bothering started earlier this week, and it got worse 2 days ago.  She says it got worse when she answered her phone on speaker phone.  She describes the pain as constant and irritated.  Pain is a 4/10.  She says sometimes it feels like it is radiating into her jaw.  She denies any mastoid pain, no difficulty hearing.  She says nothing she does makes it better or worse.   Relevant past medical, surgical, family and social history reviewed and updated as indicated. Interim medical history since our last visit reviewed. Allergies and medications reviewed and updated.  Review of Systems  Constitutional: Negative for fever or weight change.  HEENT: positive for right ear pain Respiratory: Negative for cough and shortness of breath.   Cardiovascular: Negative for chest pain or palpitations.  Gastrointestinal: Negative for abdominal pain, no bowel changes.  Musculoskeletal: Negative for gait problem or joint swelling.  Skin: Negative for rash.  Neurological: Negative for dizziness or headache.  No other specific complaints in a complete review of systems (except as listed in HPI above).      Objective:    BP 114/76   Pulse 97   Temp 98 F (36.7 C)   Resp 16   Ht $R'5\' 4"'LO$  (1.626 m)   Wt 197 lb 9.6 oz (89.6 kg)   SpO2 96%   BMI 33.92 kg/m   Wt Readings from Last 3 Encounters:  03/15/21 197 lb 9.6 oz (89.6 kg)  02/12/21 199 lb (90.3 kg)  10/13/20 199 lb (90.3 kg)    Physical Exam  Constitutional: Patient appears well-developed and well-nourished. No distress.  HEENT: head atraumatic, normocephalic, pupils equal and reactive to light, ears  left ear clear, intact TM, no erythema noted in canal, right ear clear, intact TM, cerumen (not impacted)  and erythema noted in canal, neck supple Cardiovascular: Normal rate, regular rhythm and normal heart sounds.  No murmur heard. No BLE edema. Pulmonary/Chest: Effort normal and breath sounds normal. No respiratory distress. Abdominal: Soft.  There is no tenderness. Psychiatric: Patient has a normal mood and affect. behavior is normal. Judgment and thought content normal.   Results for orders placed or performed in visit on 02/12/21  Microalbumin / creatinine urine ratio  Result Value Ref Range   Creatinine, Urine 182 20 - 275 mg/dL   Microalb, Ur 0.3 mg/dL   Microalb Creat Ratio 2 <30 mcg/mg creat  Lipid panel  Result Value Ref Range   Cholesterol 147 <200 mg/dL   HDL 63 > OR = 50 mg/dL   Triglycerides 82 <150 mg/dL   LDL Cholesterol (Calc) 68 mg/dL (calc)   Total CHOL/HDL Ratio 2.3 <5.0 (calc)   Non-HDL Cholesterol (Calc) 84 <130 mg/dL (calc)  COMPLETE METABOLIC PANEL WITH GFR  Result Value Ref Range   Glucose, Bld 62 (L) 65 - 99 mg/dL   BUN 13 7 - 25 mg/dL   Creat 0.78 0.60 - 1.00 mg/dL   eGFR 79 >  OR = 60 mL/min/1.75m   BUN/Creatinine Ratio NOT APPLICABLE 6 - 22 (calc)   Sodium 142 135 - 146 mmol/L   Potassium 3.9 3.5 - 5.3 mmol/L   Chloride 106 98 - 110 mmol/L   CO2 27 20 - 32 mmol/L   Calcium 9.2 8.6 - 10.4 mg/dL   Total Protein 6.6 6.1 - 8.1 g/dL   Albumin 4.0 3.6 - 5.1 g/dL   Globulin 2.6 1.9 - 3.7 g/dL (calc)   AG Ratio 1.5 1.0 - 2.5 (calc)   Total Bilirubin 0.5 0.2 - 1.2 mg/dL   Alkaline phosphatase (APISO) 69 37 - 153 U/L   AST 14 10 - 35 U/L   ALT 13 6 - 29 U/L  CBC with Differential/Platelet  Result Value Ref Range   WBC 9.9 3.8 - 10.8 Thousand/uL   RBC 4.45 3.80 - 5.10 Million/uL   Hemoglobin 12.2 11.7 - 15.5 g/dL   HCT 37.4 35.0 - 45.0 %   MCV 84.0 80.0 - 100.0 fL   MCH 27.4 27.0 - 33.0 pg   MCHC 32.6 32.0 - 36.0 g/dL   RDW 13.3 11.0 - 15.0 %    Platelets 324 140 - 400 Thousand/uL   MPV 9.6 7.5 - 12.5 fL   Neutro Abs 5,762 1,500 - 7,800 cells/uL   Lymphs Abs 3,307 850 - 3,900 cells/uL   Absolute Monocytes 663 200 - 950 cells/uL   Eosinophils Absolute 139 15 - 500 cells/uL   Basophils Absolute 30 0 - 200 cells/uL   Neutrophils Relative % 58.2 %   Total Lymphocyte 33.4 %   Monocytes Relative 6.7 %   Eosinophils Relative 1.4 %   Basophils Relative 0.3 %  POCT HgB A1C  Result Value Ref Range   Hemoglobin A1C 7.2 (A) 4.0 - 5.6 %   HbA1c POC (<> result, manual entry)     HbA1c, POC (prediabetic range)     HbA1c, POC (controlled diabetic range)        Assessment & Plan:   Problem List Items Addressed This Visit   None Visit Diagnoses     Acute otitis externa of right ear, unspecified type    -  Primary   Discussed prescription for antibiotic ear drops.  Will get if no improvement by this weekend.    Relevant Medications   ofloxacin (FLOXIN OTIC) 0.3 % OTIC solution        Follow up plan: No follow-ups on file.

## 2021-04-10 ENCOUNTER — Telehealth: Payer: Self-pay

## 2021-04-10 ENCOUNTER — Other Ambulatory Visit: Payer: Self-pay

## 2021-04-10 NOTE — Progress Notes (Signed)
APPOINTMENT REMINDER   AKEELAH SEPPALA was reminded to have all medications, supplements and any blood glucose and blood pressure readings available for review with [name], Pharm. D, at her telephone visit on 04/11/2021 at 9:00 am.  Patient Confirm Appointment  Questions: Per Note on 03/08/2021 [name]: Patient states she is running low on her [place].Patient reports having one full pen ,and a little bit of another one on hand as of 03/08/2021.   I reach out to [employer] to check  if the patient is  on a automatic refill or if I needed to request the refill.Per [employer], patient is on a automatic refill and shipment is set for delivery on 03/24/2021. Patient PCP office should receive shipment 10-14 days after 03/24/2021.  Patient is asking if her PCP office receive a fax from [employer].Patient states she still has not receive her shipment from them.  Patient states she called [employer] and spoke to [place] yesterday 04/09/2021 to see where her shipment was at.Patient states [facility] inform her, she did not have anything in there system regarding Tresiba,and the information her provider sent in was blank.[employer] reports the only medication they have for her is Ozempic. Patient states she wonder if they gotten that mix up when they deliver her Ozempic last time because they gave her more then she needed. Patient reports [employer] may need a refill, but they fax over what they needed to be able to process her shipment .Patient reports she has no supply of [place] on hand.Informed patient I would ask the Clinical pharmacist to look for the fax and to see if her PCP office has any sample on hand.  Patient reports her PCP increase her Ozempic from 0.5 mg to 1 mg.  Notified Clinical Pharmacist.  [place] Pharmacist Assistant [phone]

## 2021-04-11 ENCOUNTER — Ambulatory Visit (INDEPENDENT_AMBULATORY_CARE_PROVIDER_SITE_OTHER): Payer: Medicare HMO

## 2021-04-11 DIAGNOSIS — E1121 Type 2 diabetes mellitus with diabetic nephropathy: Secondary | ICD-10-CM

## 2021-04-11 DIAGNOSIS — I10 Essential (primary) hypertension: Secondary | ICD-10-CM

## 2021-04-11 MED ORDER — SEMAGLUTIDE (1 MG/DOSE) 4 MG/3ML ~~LOC~~ SOPN: 1.0000 mg | PEN_INJECTOR | SUBCUTANEOUS | Status: DC

## 2021-04-11 MED ORDER — TRESIBA FLEXTOUCH 200 UNIT/ML ~~LOC~~ SOPN: 28.0000 [IU] | PEN_INJECTOR | Freq: Every day | SUBCUTANEOUS | Status: AC

## 2021-04-11 NOTE — Progress Notes (Signed)
Chronic Care Management Pharmacy Note  04/11/2021 Name:  Christine Roberts MRN:  [id number] DOB:  10-11-1945  Summary: Patient presents for CCM follow-up. She is doing well, but has not received her refill of Tresiba from [facility]   Recommendations/Changes made from today's visit: -Will submit re-order form and request sample for patient [name]  -Continue current medications  Subjective: Christine Roberts is an 75 y.o. year old female who is a primary patient of Steele Sizer, MD.  The CCM team was consulted for assistance with disease management and care coordination needs.    Engaged with patient by telephone for follow up visit in response to provider referral for pharmacy case management and/or care coordination services.   Consent to Services:  The patient was given information about Chronic Care Management services, agreed to services, and gave verbal consent prior to initiation of services.  Please see initial visit note for detailed documentation.   Patient Care Team: Steele Sizer, MD as PCP - General (Family Medicine) Dingeldein, Remo Lipps, MD as Consulting Physician (Ophthalmology) Randa Spike, MD as Consulting Physician (Dermatology) Billey Co, MD as Consulting Physician (Urology) Efrain Sella, MD as Consulting Physician (Gastroenterology) Herbert Pun, MD as Consulting Physician ([facility]) Germaine Pomfret, [facility] (Pharmacist)  Recent office visits:  03/15/21: Patient presented to Serafina Royals, FNP for ear pain. Ofloxacin 0.3% started.  02/12/2021: Patient presented to Dr. Ancil Boozer for follow-up. A1c 7.2%. BP 130/80.  10/13/2020: Patient presented to Dr. Ancil Boozer for follow-up. BP 120/76. Lisinopril decreased to 5 mg daily. A1c worsened slightly to 7.0%.    Recent consult visits: None in previous 6 months  Hospital visits: None in previous 6 months   Objective:  Lab Results  Component Value Date   CREATININE 0.78 02/12/2021   BUN 13  02/12/2021   GFRNONAA 59 (L) 02/09/2020   GFRAA 69 02/09/2020   NA 142 02/12/2021   K 3.9 02/12/2021   CALCIUM 9.2 02/12/2021   CO2 27 02/12/2021   GLUCOSE 62 (L) 02/12/2021    Lab Results  Component Value Date/Time   HGBA1C 7.2 (A) 02/12/2021 02:02 PM   HGBA1C 7.0 (A) 10/13/2020 01:23 PM   HGBA1C 7.7 (H) 11/23/2018 08:05 AM   HGBA1C 9.1 (A) 07/21/2018 11:48 AM   HGBA1C 8.1 (A) 03/30/2018 04:05 PM   MICROALBUR 0.3 02/12/2021 02:34 PM   MICROALBUR 0.4 02/09/2020 11:17 AM   MICROALBUR 20 01/10/2017 01:39 PM   MICROALBUR 20 09/12/2015 03:42 PM    Last diabetic Eye exam:  Lab Results  Component Value Date/Time   HMDIABEYEEXA No Retinopathy 04/01/2018 12:00 AM    Last diabetic Foot exam: No results found for: HMDIABFOOTEX   Lab Results  Component Value Date   CHOL 147 02/12/2021   HDL 63 02/12/2021   LDLCALC 68 02/12/2021   TRIG 82 02/12/2021   CHOLHDL 2.3 02/12/2021    Hepatic Function Latest Ref Rng & Units 02/12/2021 02/09/2020 11/23/2018  Total Protein 6.1 - 8.1 g/dL 6.6 7.0 6.3  Albumin 3.5 - 5.0 g/dL - - -  AST 10 - 35 U/L _0 ALT 6 - 29 U/L _1 Alk Phosphatase 38 - 126 U/L - - -  Total Bilirubin 0.2 - 1.2 mg/dL 0.5 0.5 0.4    Lab Results  Component Value Date/Time   TSH 1.290 04/04/2015 08:10 AM    CBC Latest Ref Rng & Units 02/12/2021 02/09/2020 11/23/2018  WBC 3.8 - 10.8 Thousand/uL 9.9 9.0 7.4  Hemoglobin 11.7 - 15.5 g/dL 12.2 12.4 12.0  Hematocrit 35.0 - 45.0 % 37.4 38.4 36.1  Platelets 140 - 400 Thousand/uL 324 299 272    Lab Results  Component Value Date/Time   VD25OH 34.4 04/04/2015 08:10 AM    Clinical ASCVD: Yes  The 10-year ASCVD risk score (Arnett DK, et al., 2019) is: 33.1%   Values used to calculate the score:     Age: 23 years     Sex: Female     Is Non-Hispanic African American: No     Diabetic: Yes     Tobacco smoker: No     Systolic Blood Pressure: 786 mmHg     Is BP treated: Yes     HDL Cholesterol: 63 mg/dL     Total  Cholesterol: 147 mg/dL    Depression screen [facility] 2/9 03/15/2021 02/12/2021 10/13/2020  Decreased Interest 0 0 0  Down, Depressed, Hopeless 0 0 0  PHQ - 2 Score 0 0 0  Altered sleeping 0 - -  Tired, decreased energy 0 - -  Change in appetite 0 - -  Feeling bad or failure about yourself  0 - -  Trouble concentrating 0 - -  Moving slowly or fidgety/restless 0 - -  Suicidal thoughts 0 - -  PHQ-9 Score 0 - -  Difficult doing work/chores Not difficult at all - -  Some recent data might be hidden    Social History   Tobacco Use  Smoking Status Never  Smokeless Tobacco Never  Tobacco Comments   smoking cessation materials not required   BP Readings from Last 3 Encounters:  03/15/21 114/76  02/12/21 130/80  10/13/20 120/76   Pulse Readings from Last 3 Encounters:  03/15/21 97  02/12/21 98  10/13/20 92   Wt Readings from Last 3 Encounters:  03/15/21 197 lb 9.6 oz (89.6 kg)  02/12/21 199 lb (90.3 kg)  10/13/20 199 lb (90.3 kg)   BMI Readings from Last 3 Encounters:  03/15/21 33.92 kg/m  02/12/21 34.16 kg/m  10/13/20 34.16 kg/m    Assessment/Interventions: Review of patient past medical history, allergies, medications, health status, including review of consultants reports, laboratory and other test data, was performed as part of comprehensive evaluation and provision of chronic care management services.   SDOH:  (Social Determinants of Health) assessments and interventions performed: Yes SDOH Interventions    Flowsheet Row Most Recent Value  SDOH Interventions   Financial Strain Interventions Other (Comment)  [PAP]       SDOH Screenings   Alcohol Screen: Not on file  Depression (PHQ2-9): Low Risk    PHQ-2 Score: 0  Financial Resource Strain: High Risk   Difficulty of Paying Living Expenses: Hard  Food Insecurity: No Food Insecurity   Worried About Charity fundraiser in the Last Year: Never true   Ran Out of Food in the Last Year: Never true  Housing: Low Risk     Last Housing Risk Score: 0  Physical Activity: Insufficiently Active   Days of Exercise per Week: 3 days   Minutes of Exercise per Session: 30 min  Social Connections: Moderately Isolated   Frequency of Communication with Friends and Family: More than three times a week   Frequency of Social Gatherings with Friends and Family: Three times a week   Attends Religious Services: More than 4 times per year   Active Member of Clubs or Organizations: No   Attends Archivist Meetings: Never   Marital Status: Widowed  Stress: No Stress Concern Present   Feeling of Stress : Only a little  Tobacco Use: Low Risk    Smoking Tobacco Use: Never   Smokeless Tobacco Use: Never  Transportation Needs: No Transportation Needs   Lack of Transportation (Medical): No   Lack of Transportation (Non-Medical): No    CCM Care Plan  Allergies  Allergen Reactions   Latex Rash   Erythromycin Other (See Comments)    Strawberry tongue    Medications Reviewed Today     Reviewed by Salomon Fick on 03/15/21 at 1142  Med List Status: <None>   Medication Order Taking? Sig Documenting Provider Last Dose Status Informant  ACCU-CHEK SOFTCLIX LANCETS lancets [id number] Yes 1 each by Other route daily. Steele Sizer, MD Taking Active   Alcohol Swabs (B-D SINGLE USE SWABS REGULAR) PADS [id number] Yes 1 each by Does not apply route daily. Steele Sizer, MD Taking Active   aspirin EC 81 MG tablet [id number] Yes Take 1 tablet (81 mg total) by mouth daily. Steele Sizer, MD Taking Active Self  atorvastatin (LIPITOR) 40 MG tablet [id number] Yes TAKE 1 TABLET 2 TIMES A WEEK. Steele Sizer, MD Taking Active   calcium citrate (CALCITRATE - DOSED IN MG ELEMENTAL CALCIUM) 950 MG tablet [id number] Yes Take 200 mg of elemental calcium by mouth daily. Has D3 in it as well [provider] Taking Active Self  Cholecalciferol (VITAMIN D) 125 MCG (5000 UT) CAPS [id number] Yes Take by mouth. Pt taking 3  times per week [provider] Taking Active   Coenzyme Q10 (COQ10) 100 MG CAPS [id number] Yes Take 1 capsule by mouth daily. [provider] Taking Active   fluticasone (FLONASE) 50 MCG/ACT nasal spray [id number] Yes Place 2 sprays into both nostrils daily. Steele Sizer, MD Taking Active            Med Note Clemetine Marker D   Tue Aug 22, 2020  3:17 PM) PRN  glucose blood (ACCU-CHEK AVIVA PLUS) test strip [id number] Yes TEST BLOOD SUGAR UP TO FOUR TIMES DAILY AS DIRECTED Steele Sizer, MD Taking Active   insulin degludec (TRESIBA FLEXTOUCH) 200 UNIT/ML FlexTouch Pen [id number] Yes Inject 28 Units into the skin daily. Steele Sizer, MD Taking Active   Insulin Pen Needle (NOVOFINE) 30G X 8 MM MISC [id number] Yes Inject 10 each into the skin as needed. Steele Sizer, MD Taking Active   lisinopril (ZESTRIL) 5 MG tablet [id number] Yes Take 1 tablet (5 mg total) by mouth daily. Steele Sizer, MD Taking Active   metFORMIN (GLUCOPHAGE-XR) 750 MG 24 hr tablet [id number] Yes TAKE 1 TABLET EVERY DAY WITH [place], Drue Stager, MD Taking Active   polyethylene glycol powder (GLYCOLAX/MIRALAX) powder [id number] Yes Take 17 g by mouth 2 (two) times daily as needed. Steele Sizer, MD Taking Active   Semaglutide,0.25 or 0.5MG/DOS, (OZEMPIC, 0.25 OR 0.5 MG/DOSE,) 2 MG/1.5ML SOPN [id number] Yes Inject 0.375 mLs (0.5 mg total) into the skin once a week. Steele Sizer, MD Taking Active   VITAMIN B COMPLEX-C PO [id number] Yes Take by mouth. [provider] Taking Active             Patient Active Problem List   Diagnosis Date Noted   Atherosclerosis of aorta ([place]) 05/25/2018   Seasonal allergic rhinitis 09/12/2015   TIA (transient ischemic attack) 05/24/2015   Senile purpura ([place]) 05/09/2015   Benign essential HTN 03/18/2015   Pain in shoulder 03/18/2015   Dyslipidemia 03/18/2015   History of surgery to major organs, presenting hazards to  health 03/18/2015   Hearing loss  03/18/2015   Hiatal hernia 03/18/2015   History of colon polyps 03/18/2015   Calculus of kidney 03/18/2015   B12 deficiency 03/18/2015   Microalbuminuria 03/18/2015   Adult BMI 30+ 03/18/2015   Arthritis, degenerative 03/18/2015   Diabetes mellitus with nephropathy ([place]) 03/18/2015    Immunization History  Administered Date(s) Administered   Fluad Quad(high Dose 65+) 03/24/2019   Influenza Split 04/11/2009   Influenza, High Dose Seasonal PF 03/21/2015, 05/10/2016, 03/30/2017, 03/30/2018   Influenza, Seasonal, Injecte, Preservative Fre 04/07/2012   Influenza,inj,Quad PF,6+ Mos 03/07/2014   Influenza-Unspecified 03/07/2014, 03/30/2017, 05/15/2020   PFIZER(Purple Top)SARS-COV-2 Vaccination 07/15/2019, 08/05/2019, 06/27/2020   Pneumococcal Conjugate-13 03/21/2015   Pneumococcal Polysaccharide-23 10/25/2009   Tdap 10/25/2009, 03/14/2018   Zoster, Live 09/24/2011    Conditions to be addressed/monitored:  Hypertension, Hyperlipidemia, Diabetes, Coronary Artery Disease, Osteoarthritis and History of TIA  Care Plan : General Pharmacy (Adult)  Updates made by Germaine Pomfret, [place] since 04/11/2021 12:00 AM     Problem: Hypertension, Hyperlipidemia, Diabetes, Coronary Artery Disease, Osteoarthritis and History of TIA   Priority: High     Long-Range Goal: Patient-Specific Goal   Start Date: 12/06/2020  Expected End Date: 06/07/2021  This Visit's Progress: On track  Recent Progress: On track  Priority: High  Note:   Current Barriers:  Unable to independently afford treatment regimen  Pharmacist Clinical Goal(s):  Patient will verbalize ability to afford treatment regimen maintain control of diabetes as evidenced by A1c less than 8%  through collaboration with PharmD and provider.   Interventions: 1:1 collaboration with Steele Sizer, MD regarding development and update of comprehensive plan of care as evidenced by provider attestation and co-signature Inter-disciplinary care  team collaboration (see longitudinal plan of care) Comprehensive medication review performed; medication list updated in electronic medical record  Hypertension (BP goal <140/90) -Controlled -Current treatment: Lisinopril 5 mg daily  -Medications previously tried: NA  -Current home readings: Does not monitor -Current dietary habits: Does not eat often after 5pm. Largest meal is around 2pm, will have a small snack in the evening.  -Current exercise habits: [employer] work, previously was walking but not recently.  -increased stress, daughter diagnosed with lung cancer.  -Denies hypotensive/hypertensive symptoms -Recommended to continue current medication  Hyperlipidemia: (LDL goal < 70) -Controlled  -History of TIA  -Current treatment: Atorvastatin 40 mg twice weekly  -Current antiplatelet treatment: Aspirin 81 mg daily -Medications previously tried: NA  -Myalgias managable, continues to take CoQ10 supplementation which she feels helps her manage some of her symptoms. -Educated on Benefits of statin for ASCVD risk reduction; -Recommended to continue current medication  Diabetes (A1c goal <8%) -Controlled -Current medications: Metformin XR 750 mg daily  Ozempic 1 mg weekly  Tresiba 28 units daily  -Medications previously tried: NA  -Current home glucose readings fasting glucose: 107, 158 (unsure why, stress), 141, 104, 123,  -Did not receive tresiba refill -Some stomach cramping with ozempic dose. -Patient did not receive refill of Tresiba from [employer] [employer]. Will submit re-order form and request sample for patient. -Continue current medications  Patient Goals/Self-Care Activities Patient will:  - check glucose daily, document, and provide at future appointments check blood pressure weekly, document, and provide at future appointments  Follow Up Plan: Telephone follow up appointment with care management team member scheduled for:  07/18/2020 at 11:00 AM       Medication  Assistance:  [place] and [place] obtained through [employer] medication assistance program.  Enrollment ends Dec  2022  Compliance/Adherence/Medication fill history: Care Gaps: [facility] Ophthalmology Exam  Star-Rating Drugs: Atorvastatin 40 mg twice weekly: Last filled 10/05/20 for 84-DS Lisinopril 5 mg daily: Last filled 10/06/20 for 90-DS Metformin XR 750 mg daily: Last filled 08/21/20 and 06/14/20 for 90-DS.   Patient's preferred pharmacy is:  [facility], [address] [place] [address] Phone: [phone] Fax: [phone]  [facility] [address] [place] [address] [place] [address] Phone: [phone] Fax: [phone]  Uses pill box? Yes Pt endorses 100% compliance  We discussed: Current pharmacy is preferred with insurance plan and patient is satisfied with pharmacy services Patient decided to: Continue current medication management strategy  Care Plan and Follow Up Patient Decision:  Patient agrees to Care Plan and Follow-up.  Plan: Telephone follow up appointment with care management team member scheduled for:  07/18/2020 at 11:00 AM  Malva Limes, [facility] [phone]

## 2021-04-11 NOTE — Patient Instructions (Signed)
Visit Information It was great speaking with you today!  Please let me know if you have any questions about our visit.   Goals Addressed             This Visit's Progress    Monitor and Manage My Blood Sugar-Diabetes Type 2   On track    Timeframe:  Long-Range Goal Priority:  High Start Date: 12/06/2020                            Expected End Date: 06/07/2022                      Follow Up within 90 days   - check blood sugar at prescribed times - check blood sugar if I feel it is too high or too low - take the blood sugar log to all doctor visits    Why is this important?   Checking your blood sugar at home helps to keep it from getting very high or very low.  Writing the results in a diary or log helps the doctor know how to care for you.  Your blood sugar log should have the time, date and the results.  Also, write down the amount of insulin or other medicine that you take.  Other information, like what you ate, exercise done and how you were feeling, will also be helpful.     Notes:         Patient Care Plan: General Pharmacy (Adult)     Problem Identified: Hypertension, Hyperlipidemia, Diabetes, Coronary Artery Disease, Osteoarthritis and History of TIA   Priority: High     Long-Range Goal: Patient-Specific Goal   Start Date: 12/06/2020  Expected End Date: 06/07/2021  This Visit's Progress: On track  Recent Progress: On track  Priority: High  Note:   Current Barriers:  Unable to independently afford treatment regimen  Pharmacist Clinical Goal(s):  Patient will verbalize ability to afford treatment regimen maintain control of diabetes as evidenced by A1c less than 8%  through collaboration with PharmD and provider.   Interventions: 1:1 collaboration with Steele Sizer, MD regarding development and update of comprehensive plan of care as evidenced by provider attestation and co-signature Inter-disciplinary care team collaboration (see longitudinal plan of  care) Comprehensive medication review performed; medication list updated in electronic medical record  Hypertension (BP goal <140/90) -Controlled -Current treatment: Lisinopril 5 mg daily  -Medications previously tried: NA  -Current home readings: Does not monitor -Current dietary habits: Does not eat often after 5pm. Largest meal is around 2pm, will have a small snack in the evening.  -Current exercise habits: [employer] work, previously was walking but not recently.  -increased stress, daughter diagnosed with lung cancer.  -Denies hypotensive/hypertensive symptoms -Recommended to continue current medication  Hyperlipidemia: (LDL goal < 70) -Controlled  -History of TIA  -Current treatment: Atorvastatin 40 mg twice weekly  -Current antiplatelet treatment: Aspirin 81 mg daily -Medications previously tried: NA  -Myalgias managable, continues to take CoQ10 supplementation which she feels helps her manage some of her symptoms. -Educated on Benefits of statin for ASCVD risk reduction; -Recommended to continue current medication  Diabetes (A1c goal <8%) -Controlled -Current medications: Metformin XR 750 mg daily  Ozempic 1 mg weekly  Tresiba 28 units daily  -Medications previously tried: NA  -Current home glucose readings fasting glucose: 107, 158 (unsure why, stress), 141, 104, 123,  -Did not receive tresiba refill -Some stomach cramping  with ozempic dose. -Patient did not receive refill of Tresiba from [employer]. Will submit re-order form and request sample for patient. -Continue current medications  Patient Goals/Self-Care Activities Patient will:  - check glucose daily, document, and provide at future appointments check blood pressure weekly, document, and provide at future appointments  Follow Up Plan: Telephone follow up appointment with care management team member scheduled for:  07/18/2020 at 11:00 AM      Patient agreed to services and verbal consent obtained.    The patient verbalized understanding of instructions, educational materials, and care plan provided today and declined offer to receive copy of patient instructions, educational materials, and care plan.   Malva Limes, [facility] [phone]

## 2021-04-12 ENCOUNTER — Ambulatory Visit
Admission: RE | Admit: 2021-04-12 | Discharge: 2021-04-12 | Disposition: A | Discharge status: Home / Self Care | Payer: Medicare HMO | Source: Ambulatory Visit | Attending: Urology | Admitting: Urology

## 2021-04-12 ENCOUNTER — Other Ambulatory Visit: Payer: Self-pay

## 2021-04-12 ENCOUNTER — Ambulatory Visit
Admission: RE | Admit: 2021-04-12 | Discharge: 2021-04-12 | Disposition: A | Discharge status: Home / Self Care | Payer: Medicare HMO | Attending: Urology | Admitting: Urology

## 2021-04-12 ENCOUNTER — Encounter: Payer: Self-pay | Admitting: Urology

## 2021-04-12 ENCOUNTER — Ambulatory Visit: Payer: Medicare HMO | Admitting: Urology

## 2021-04-12 VITALS — BP 120/76 | HR 106 | Ht 64.0 in | Wt 197.0 lb

## 2021-04-12 DIAGNOSIS — N200 Calculus of kidney: Secondary | ICD-10-CM

## 2021-04-12 DIAGNOSIS — R351 Nocturia: Secondary | ICD-10-CM

## 2021-04-12 DIAGNOSIS — Z87442 Personal history of urinary calculi: Secondary | ICD-10-CM | POA: Diagnosis not present

## 2021-04-12 NOTE — Patient Instructions (Signed)
Minimize fluids 3 to 4 hours before bedtime, avoid bladder irritants like tea, diet drinks, and soda, and alcohol in the evenings.  To urinate twice before going to bed.  Consider wearing compression stockings during the day, as these can prevent excess urination overnight.  You can also elevate your legs in a recliner in the mid afternoon to help get rid of excess fluid and improve overnight urination.  Some patients also take melatonin over-the-counter to help them sleep better, which can decrease the overnight urination

## 2021-04-12 NOTE — Progress Notes (Signed)
   04/12/2021 4:03 PM   Christine Roberts May 06, 1946 [id number]  Reason for visit: Follow up nephrolithiasis, new nocturia  HPI: 75 year old female with history of lithotripsy previously as well as ureteroscopy with Dr. Bernardo Heater in 2015, and she did not tolerate her stent well at that time.  We have been following her for a small 2 mm right nonobstructive midpole stone.  She denies any stone problems over the last year, I personally reviewed her KUB today that shows very faintly visible possible 2 mm punctate right midpole stone that is unchanged from previously.  Her primary issue today is nocturia 3-4 times overnight that has worsened over the last few years.  She denies any problems or urgency during the day.  She drinks some occasional tea or Coke in the afternoons, and does report some leg swelling in the evenings.  She has never been evaluated for sleep apnea, but denies any snoring.  I recommended starting with behavioral strategies including minimizing fluids in the evening, voiding prior to bedtime, avoiding bladder irritants, lower extremity compression stockings, and elevation of the legs in the afternoon.  We also discussed melatonin as an option to potentially improve sleep as well.  Finally, she could consider a low-dose desmopressin 1 hour prior to bedtime if she does not get significant improvement with the above strategies.  Recent sodium was normal at 142.  We discussed general stone prevention strategies including adequate hydration with goal of producing 2.5 L of urine daily, increasing citric acid intake, increasing calcium intake during high oxalate meals, minimizing animal protein, and decreasing salt intake. Information about dietary recommendations given today.   Behavioral strategies discussed at length regarding kidney stones and nocturia RTC 6 weeks symptom check regarding nocturia, consider low-dose desmopressin at that time if persistent symptoms  Billey Co,  MD  [facility] [address] [place], [place] [address] [phone]

## 2021-04-17 ENCOUNTER — Telehealth: Payer: Self-pay

## 2021-04-17 NOTE — Progress Notes (Signed)
Chronic Care Management Pharmacy Assistant   Name: Christine Roberts  MRN: [id number] DOB: 30-Oct-1945  Reason for Encounter: Medication Review/Patient assistance renewal for Christine Roberts through [employer].    Recent office visits:  No recent Office Visit  Recent consult visits:  04/12/2021 Dr.Sninsky MD (Urology) No medication Changes noted, Return in 6 weeks for follow up.  Hospital visits:  None in previous 6 months  Medications: Outpatient Encounter Medications as of 04/17/2021  Medication Sig   ACCU-CHEK SOFTCLIX LANCETS lancets 1 each by Other route daily.   Alcohol Swabs (B-D SINGLE USE SWABS REGULAR) PADS 1 each by Does not apply route daily.   aspirin EC 81 MG tablet Take 1 tablet (81 mg total) by mouth daily.   atorvastatin (LIPITOR) 40 MG tablet TAKE 1 TABLET 2 TIMES A WEEK.   calcium citrate (CALCITRATE - DOSED IN MG ELEMENTAL CALCIUM) 950 MG tablet Take 200 mg of elemental calcium by mouth daily. Has D3 in it as well   Cholecalciferol (VITAMIN D) 125 MCG (5000 UT) CAPS Take by mouth. Pt taking 3 times per week   Coenzyme Q10 (COQ10) 100 MG CAPS Take 1 capsule by mouth daily.   fluticasone (FLONASE) 50 MCG/ACT nasal spray Place 2 sprays into both nostrils daily.   glucose blood (ACCU-CHEK AVIVA PLUS) test strip TEST BLOOD SUGAR UP TO FOUR TIMES DAILY AS DIRECTED   insulin degludec (TRESIBA FLEXTOUCH) 200 UNIT/ML FlexTouch Pen Inject 28 Units into the skin daily. Patient receives through [employer] Patient Assistance through Dec 2022   Insulin Pen Needle (NOVOFINE) 30G X 8 MM MISC Inject 10 each into the skin as needed.   lisinopril (ZESTRIL) 5 MG tablet Take 1 tablet (5 mg total) by mouth daily.   metFORMIN (GLUCOPHAGE-XR) 750 MG 24 hr tablet TAKE 1 TABLET EVERY DAY WITH BREAKFAST   PFIZER-BIONT COVID-19 VAC-TRIS SUSP injection    polyethylene glycol powder (GLYCOLAX/MIRALAX) powder Take 17 g by mouth 2 (two) times daily as needed.   Semaglutide, 1 MG/DOSE, 4  MG/3ML SOPN Inject 1 mg as directed once a week. Patient receives through [employer] Patient Assistance through Dec 2022   VITAMIN B COMPLEX-C PO Take by mouth.   No facility-administered encounter medications on file as of 04/17/2021.    Care Gaps: Shingrix vaccine COVID 19 Vaccine (4- Booster for [employer]) Ophthalmology Exam (Last Completed 04/01/2018) Influenza Vaccine. Mammogram (Last Completed 03/18/2019) Star Rating Drugs: Atorvastatin 40 mg last filled 03/19/2021 for 84 day supply (no pharmacy noted) Lisinopril 5 mg last filled 02/13/2021 for 90 day (no pharmacy noted) Metformin 750 mg last filled 01/25/2021 for 90 days supply (no pharmacy noted) Ozempic 0.5 mg  (Approved from [employer]) Christine Roberts 30 units daily (Approved from [employer])  I received a task from [employer], CPP requesting that I start the renewal  process  for patient assistance on the medication Ozempic, and Tresiba.    Spoke with the patient and she  requested that the application  be place at the front desk for her to sign .Patient states she would like the clinical pharmacist to highlight the area where she needs to sign.Patient denies any changes in her income at this time.Notified Clinical Pharmacist.  Informed patient, once patient completes her part of the application ,[name], CPP will fax it  over to [employer] for processing. The application will need to be faxed to [phone] and the phone number that can be called to check the status of  the application will be [phone].   Patient verbalized understanding and was provided my phone number of [phone] if she has any questions.   Application emailed to [name], CPP for review and to be place at the front desk for patient to sign and return.Application will be fax to continue to receive assistance through the year of 2023.Marland Kitchen  Patient states her PCP office inform her that Ozempic has been ship to them, but Christine Roberts has  not.Patient states she will wait until Christine Roberts arrives to avoid making a double trip.I ask patient if she had confirm with Christine Roberts if they was going to [place] as well.Patient states she did not confirm, but she spoke with the clinical pharmacist and he inform her he submitted re-order form .Inform patient I would call [employer] to get the status and to confirm if her Christine Roberts is out for shipment.Patient Verbalized understanding and appreciation.    Per [employer], they did received the re-order form on 04/12/2021 and it has been process.Patient PCP Office should receive shipment between 10-14 days.  Inform patient of [employer] response. Patient Verbalized understanding.  [place] Pharmacist Assistant [phone]

## 2021-05-01 NOTE — Progress Notes (Signed)
Per Clinical Pharmacist, please let patient know to bring in a copy of income for 2022 when she stops by the office, I do not have hers.  Patient states she brought in a copy of her income last  week for year 2022 and gave it to the front desk.Patient states they told her they was going to give it to the clinical pharmacist.Notified Clinical Pharmacist.   [place] Pharmacist Assistant [phone]

## 2021-05-07 DIAGNOSIS — I10 Essential (primary) hypertension: Secondary | ICD-10-CM | POA: Diagnosis not present

## 2021-05-07 DIAGNOSIS — E1121 Type 2 diabetes mellitus with diabetic nephropathy: Secondary | ICD-10-CM

## 2021-05-18 ENCOUNTER — Telehealth: Payer: Self-pay

## 2021-05-18 NOTE — Progress Notes (Signed)
    Chronic Care Management Pharmacy Assistant   Name: Christine Roberts  MRN: [id number] DOB: Jan 10, 1946  Reason for Encounter: Medication Review/Patient assistance renewal status for year 2023 for Ozempic, and Tresiba.   Hospital visits:  None in previous 6 months  Medications: Outpatient Encounter Medications as of 05/18/2021  Medication Sig   ACCU-CHEK SOFTCLIX LANCETS lancets 1 each by Other route daily.   Alcohol Swabs (B-D SINGLE USE SWABS REGULAR) PADS 1 each by Does not apply route daily.   aspirin EC 81 MG tablet Take 1 tablet (81 mg total) by mouth daily.   atorvastatin (LIPITOR) 40 MG tablet TAKE 1 TABLET 2 TIMES A WEEK.   calcium citrate (CALCITRATE - DOSED IN MG ELEMENTAL CALCIUM) 950 MG tablet Take 200 mg of elemental calcium by mouth daily. Has D3 in it as well   Cholecalciferol (VITAMIN D) 125 MCG (5000 UT) CAPS Take by mouth. Pt taking 3 times per week   Coenzyme Q10 (COQ10) 100 MG CAPS Take 1 capsule by mouth daily.   fluticasone (FLONASE) 50 MCG/ACT nasal spray Place 2 sprays into both nostrils daily.   glucose blood (ACCU-CHEK AVIVA PLUS) test strip TEST BLOOD SUGAR UP TO FOUR TIMES DAILY AS DIRECTED   insulin degludec (TRESIBA FLEXTOUCH) 200 UNIT/ML FlexTouch Pen Inject 28 Units into the skin daily. Patient receives through [employer] Patient Assistance through Dec 2022   Insulin Pen Needle (NOVOFINE) 30G X 8 MM MISC Inject 10 each into the skin as needed.   lisinopril (ZESTRIL) 5 MG tablet Take 1 tablet (5 mg total) by mouth daily.   metFORMIN (GLUCOPHAGE-XR) 750 MG 24 hr tablet TAKE 1 TABLET EVERY DAY WITH BREAKFAST   PFIZER-BIONT COVID-19 VAC-TRIS SUSP injection    polyethylene glycol powder (GLYCOLAX/MIRALAX) powder Take 17 g by mouth 2 (two) times daily as needed.   Semaglutide, 1 MG/DOSE, 4 MG/3ML SOPN Inject 1 mg as directed once a week. Patient receives through [employer] Patient Assistance through Dec 2022   VITAMIN B COMPLEX-C PO Take by mouth.   No  facility-administered encounter medications on file as of 05/18/2021.   I reach out to [employer], to check on the status of the renewal application for patient assistance for Ozempic, Tresbia for year 2023.  Per [employer], on page 2 of the application patient did not sign, or put the correct number of people who lives in the household.Patient put "0" which it should be "1" if it is just herself. On Page 4, the provider did not document the date she sign.Page 2 and page 4 needs to be resubmitted with corrections.Notified Clinical pharmacist.   Patient verbalized understanding, and states she will come by the office on Wednesday around lunch to redo page 2.Notified Clinical pharmacist.  [place] Clinical Pharmacist Assistant [phone]

## 2021-05-24 ENCOUNTER — Ambulatory Visit: Payer: Medicare HMO | Admitting: Physician Assistant

## 2021-05-24 ENCOUNTER — Other Ambulatory Visit: Payer: Self-pay

## 2021-05-24 ENCOUNTER — Encounter: Payer: Self-pay | Admitting: Physician Assistant

## 2021-05-24 VITALS — BP 146/82 | HR 92 | Ht 64.0 in | Wt 192.0 lb

## 2021-05-24 DIAGNOSIS — R351 Nocturia: Secondary | ICD-10-CM

## 2021-05-24 MED ORDER — DESMOPRESSIN ACETATE 0.1 MG PO TABS: 0.0500 mg | ORAL_TABLET | Freq: Every day | ORAL | 0 refills | Status: DC

## 2021-05-24 NOTE — Progress Notes (Signed)
05/24/2021 2:05 PM   Christine Roberts 29-Nov-1945 [id number]  CC: Chief Complaint  Patient presents with   Follow-up   HPI: Christine Roberts is a 75 y.o. female with PMH nephrolithiasis and nocturia who presents today for symptom recheck after starting lifestyle modifications.   Today she reports she has been avoiding caffeine in the afternoon and elevating her legs in the evening.  She has not noticed any major change in her nocturia, which occurs approximately every 2 hours.  She does not think she has sleep apnea.  She denies daytime frequency or urgency.  No history of hyponatremia.  She does have intermittent constipation but denies dry mouth.  PMH: Past Medical History:  Diagnosis Date   Diabetes mellitus without complication ([place])    Hyperlipidemia    Hypertension    Kidney stone    Left shoulder pain    Obesity    Osteoarthritis    Ovarian failure    Stroke ([place])    Vitamin B12 deficiency (non anemic)     Surgical History: Past Surgical History:  Procedure Laterality Date   ABDOMINAL HYSTERECTOMY     CHOLECYSTECTOMY     COLONOSCOPY  2015   COLONOSCOPY WITH PROPOFOL N/A 03/04/2018   Procedure: COLONOSCOPY WITH PROPOFOL;  Surgeon: Toledo, Benay Pike, MD;  Location: [facility] ENDOSCOPY;  Service: Gastroenterology;  Laterality: N/A;   cystoscopic     stent placed in ureter   LITHOTRIPSY  12/2007   MYRINGOTOMY Right 11/2009    Home Medications:  Allergies as of 05/24/2021       Reactions   Latex Rash   Erythromycin Other (See Comments)   Strawberry tongue        Medication List        Accurate as of May 24, 2021  2:05 PM. If you have any questions, ask your nurse or doctor.          Accu-Chek Aviva Plus test strip Generic drug: glucose blood TEST BLOOD SUGAR UP TO FOUR TIMES DAILY AS DIRECTED   Accu-Chek Softclix Lancets lancets 1 each by Other route daily.   aspirin EC 81 MG tablet Take 1 tablet (81 mg total) by mouth daily.   atorvastatin 40  MG tablet Commonly known as: LIPITOR TAKE 1 TABLET 2 TIMES A WEEK.   B-D SINGLE USE SWABS REGULAR Pads 1 each by Does not apply route daily.   calcium citrate 950 (200 Ca) MG tablet Commonly known as: CALCITRATE - dosed in mg elemental calcium Take 200 mg of elemental calcium by mouth daily. Has D3 in it as well   CoQ10 100 MG Caps Take 1 capsule by mouth daily.   desmopressin 0.1 MG tablet Commonly known as: DDAVP Take 0.5 tablets (0.05 mg total) by mouth at bedtime. Started by: Debroah Loop, PA-C   fluticasone 50 MCG/ACT nasal spray Commonly known as: FLONASE Place 2 sprays into both nostrils daily.   Insulin Pen Needle 30G X 8 MM Misc Commonly known as: NovoFine Inject 10 each into the skin as needed.   lisinopril 5 MG tablet Commonly known as: ZESTRIL Take 1 tablet (5 mg total) by mouth daily.   metFORMIN 750 MG 24 hr tablet Commonly known as: GLUCOPHAGE-XR TAKE 1 TABLET EVERY DAY WITH BREAKFAST   Pfizer-BioNT COVID-19 Vac-TriS Susp injection Generic drug: COVID-19 mRNA Vac-TriS (Pfizer)   polyethylene glycol powder 17 GM/SCOOP powder Commonly known as: GLYCOLAX/MIRALAX Take 17 g by mouth 2 (two) times daily as needed.   Semaglutide (1  MG/DOSE) 4 MG/3ML Sopn Inject 1 mg as directed once a week. Patient receives through [employer] Patient Assistance through Dec 2022   [name] FlexTouch 200 UNIT/ML FlexTouch Pen Generic drug: insulin degludec Inject 28 Units into the skin daily. Patient receives through [employer] Patient Assistance through Dec 2022   VITAMIN B COMPLEX-C PO Take by mouth.   Vitamin D 125 MCG (5000 UT) Caps Take by mouth. Pt taking 3 times per week        Allergies:  Allergies  Allergen Reactions   Latex Rash   Erythromycin Other (See Comments)    Strawberry tongue    Family History: Family History  Problem Relation Age of Onset   Cancer Mother    Diabetes Mother    Diabetes Daughter    CAD Father    Diabetes  Daughter        hypoglycemia    Social History:   reports that she has never smoked. She has never used smokeless tobacco. She reports current alcohol use of about 1.0 standard drink per week. She reports that she does not use drugs.  Physical Exam: BP (!) 146/82   Pulse 92   Ht 5\' 4"  (1.626 m)   Wt 192 lb (87.1 kg)   BMI 32.96 kg/m   Constitutional:  Alert and oriented, no acute distress, nontoxic appearing HEENT: [place], AT Cardiovascular: No clubbing, cyanosis, or edema Respiratory: Normal respiratory effort, no increased work of breathing Skin: No rashes, bruises or suspicious lesions Neurologic: Grossly intact, no focal deficits, moving all 4 extremities Psychiatric: Normal mood and affect  Assessment & Plan:   1. Nocturia Offered her a trial of low-dose desmopressin, which she accepted.  We will plan for symptom recheck and BMP in 1 month.  May consider dose adjustment at that time versus nighttime anticholinergics if she continues to be symptomatic. - desmopressin (DDAVP) 0.1 MG tablet; Take 0.5 tablets (0.05 mg total) by mouth at bedtime.  Dispense: 15 tablet; Refill: 0 - Basic metabolic panel; Future  Return in about 4 weeks (around 06/21/2021) for Symptom recheck with BMP.  Debroah Loop, PA-C  [facility] [address], [place],  [address] [phone]

## 2021-06-14 NOTE — Progress Notes (Signed)
Name: Christine Roberts   MRN: [id number]    DOB: 1946-01-20   Date:06/15/2021       Progress Note  Subjective  Chief Complaint  Follow Up  HPI  DMII: she is on Metformin 750 mg daily , [name] now down to 26  units , Ozempic 1 ( through assistance program) . She is compliant with statin therapy. A1C today is down to 6.8 % . She has dyslipidemia and obesity, neuropathy, nephrolphaty   also HTN . Taking statins and ACE, bp is up again and we will increase lisinopril from 5 mg to 10 mg daily .  Stress: her grandson went to rehab for 2 weeks and after that he moved to [place] to live with his sister.She now has a Teacher, English as a foreign language. Feeling better now . [place] going [place] , now she is not sure if she wants to move out of her house    HTN : compliant with medication,  no chest pain or palpitation. She is down to 5 mg daily and bp is at goal today. No dizziness lately    Obesity: her weight is stable now, BMI is below 35. She still eats smaller portions Weight has gone up a little, she states not sure why. Trying to eat healthy but has not been as active since the it has been cold outside.    Hyperlipidemia: taking lipitor one two or three times a week to avoid side effects, otherwise she has leg pains. Last LDL was 68    TIA versus Complicated migraine: she developed dizziness, headache and aphasia  on 05/24/2019 - she was transported by EMS to [facility], she had normal Echo, CT, MRI of brain and carotid dopplers.  She was seen by neurologist and advised to continue on aspirin and lipitor in case it was a TIA, but likely a complicated migraine.  No other episodes    B12 deficiency: taking it otc three times a week , last level was at goal . Continue supplementation    Nephrolithiasis: seen by Urologist and no intervention required. No recent episodes.   Senile purpura:  skin very thin, has it on both arms today, and stable.    Atherosclerosis of aorta: continue  statin therapy and aspirin 81 mg daily , she is now taking Atorvastatin a few times a week.and LDL has been at goal    Grade III hemorrhoids; seen by Dr. Alice Reichert - GI and was referred to general surgeon for possible banding, but she wants to wait until her next colonoscopy in 2024 , she wants to be under anesthesia   Neck pain: she states a couple of months ago noticed pain on right side of neck, worse at night, difficulty sleeping, aching like and now is worse on the left side. She got a new pillow . We will try low dose baclofen at night   Patient Active Problem List   Diagnosis Date Noted   Atherosclerosis of aorta ([place]) 05/25/2018   Seasonal allergic rhinitis 09/12/2015   TIA (transient ischemic attack) 05/24/2015   Senile purpura ([place]) 05/09/2015   Benign essential HTN 03/18/2015   Pain in shoulder 03/18/2015   Dyslipidemia 03/18/2015   History of surgery to major organs, presenting hazards to health 03/18/2015   Hearing loss 03/18/2015   Hiatal hernia 03/18/2015   History of colon polyps 03/18/2015   Calculus of kidney 03/18/2015   B12 deficiency 03/18/2015   Microalbuminuria 03/18/2015   Adult  BMI 30+ 03/18/2015   Arthritis, degenerative 03/18/2015   Diabetes mellitus with nephropathy ([place]) 03/18/2015    Past Surgical History:  Procedure Laterality Date   ABDOMINAL HYSTERECTOMY     CHOLECYSTECTOMY     COLONOSCOPY  2015   COLONOSCOPY WITH PROPOFOL N/A 03/04/2018   Procedure: COLONOSCOPY WITH PROPOFOL;  Surgeon: Toledo, Benay Pike, MD;  Location: [facility] ENDOSCOPY;  Service: Gastroenterology;  Laterality: N/A;   cystoscopic     stent placed in ureter   LITHOTRIPSY  12/2007   MYRINGOTOMY Right 11/2009    Family History  Problem Relation Age of Onset   Cancer Mother    Diabetes Mother    Diabetes Daughter    CAD Father    Diabetes Daughter        hypoglycemia    Social History   Tobacco Use   Smoking status: Never   Smokeless tobacco: Never   Tobacco comments:     smoking cessation materials not required  Substance Use Topics   Alcohol use: Yes    Alcohol/week: 1.0 standard drink    Types: 1 Shots of liquor per week    Comment: socially     Current Outpatient Medications:    ACCU-CHEK SOFTCLIX LANCETS lancets, 1 each by Other route daily., Disp: 100 each, Rfl: 1   Alcohol Swabs (B-D SINGLE USE SWABS REGULAR) PADS, 1 each by Does not apply route daily., Disp: 400 each, Rfl: 1   aspirin EC 81 MG tablet, Take 1 tablet (81 mg total) by mouth daily., Disp: 90 tablet, Rfl: 0   atorvastatin (LIPITOR) 40 MG tablet, TAKE 1 TABLET 2 TIMES A WEEK., Disp: 24 tablet, Rfl: 1   baclofen (LIORESAL) 10 MG tablet, Take 1 tablet (10 mg total) by mouth 3 (three) times daily., Disp: 30 each, Rfl: 0   calcium citrate (CALCITRATE - DOSED IN MG ELEMENTAL CALCIUM) 950 MG tablet, Take 200 mg of elemental calcium by mouth daily. Has D3 in it as well, Disp: , Rfl:    Cholecalciferol (VITAMIN D) 125 MCG (5000 UT) CAPS, Take by mouth. Pt taking 3 times per week, Disp: , Rfl:    Coenzyme Q10 (COQ10) 100 MG CAPS, Take 1 capsule by mouth daily., Disp: , Rfl:    desmopressin (DDAVP) 0.1 MG tablet, Take 0.5 tablets (0.05 mg total) by mouth at bedtime., Disp: 15 tablet, Rfl: 0   fluticasone (FLONASE) 50 MCG/ACT nasal spray, Place 2 sprays into both nostrils daily., Disp: 16 g, Rfl: 6   glucose blood (ACCU-CHEK AVIVA PLUS) test strip, TEST BLOOD SUGAR UP TO FOUR TIMES DAILY AS DIRECTED, Disp: 400 each, Rfl: 0   insulin degludec (TRESIBA FLEXTOUCH) 200 UNIT/ML FlexTouch Pen, Inject 28 Units into the skin daily. Patient receives through [employer] Patient Assistance through Dec 2022, Disp: , Rfl:    Insulin Pen Needle (NOVOFINE) 30G X 8 MM MISC, Inject 10 each into the skin as needed., Disp: 100 each, Rfl: 2   metFORMIN (GLUCOPHAGE-XR) 750 MG 24 hr tablet, TAKE 1 TABLET EVERY DAY WITH BREAKFAST, Disp: 90 tablet, Rfl: 1   polyethylene glycol powder (GLYCOLAX/MIRALAX) powder, Take 17 g by  mouth 2 (two) times daily as needed., Disp: 3350 g, Rfl: 1   Semaglutide, 1 MG/DOSE, 4 MG/3ML SOPN, Inject 1 mg as directed once a week. Patient receives through [employer] Patient Assistance through Dec 2022, Disp: , Rfl:    VITAMIN B COMPLEX-C PO, Take by mouth., Disp: , Rfl:    lisinopril (ZESTRIL) 10 MG tablet,  Take 1 tablet (10 mg total) by mouth daily., Disp: 90 tablet, Rfl: 1  Allergies  Allergen Reactions   Latex Rash   Erythromycin Other (See Comments)    Strawberry tongue    I personally reviewed active problem list, medication list, allergies, family history, social history, health maintenance with the patient/caregiver today.   ROS  Constitutional: Negative for fever or weight change.  Respiratory: Negative for cough and shortness of breath.   Cardiovascular: Negative for chest pain or palpitations.  Gastrointestinal: Negative for abdominal pain, no bowel changes.  Musculoskeletal: Negative for gait problem or joint swelling.  Skin: Negative for rash.  Neurological: Negative for dizziness or headache.  No other specific complaints in a complete review of systems (except as listed in HPI above).   Objective  Vitals:   06/15/21 1118  BP: 140/74  Pulse: 97  Resp: 16  Temp: 98 F (36.7 C)  TempSrc: Oral  SpO2: 98%  Weight: 197 lb (89.4 kg)  Height: 5\' 4"  (1.626 m)    Body mass index is 33.81 kg/m.  Physical Exam  Constitutional: Patient appears well-developed and well-nourished. Obese  No distress.  HEENT: head atraumatic, normocephalic, pupils equal and reactive to light,  neck supple,normal rom but pain during palpation of left posterior neck  Cardiovascular: Normal rate, regular rhythm and normal heart sounds.  No murmur heard. No BLE edema. Pulmonary/Chest: Effort normal and breath sounds normal. No respiratory distress. Abdominal: Soft.  There is no tenderness. Psychiatric: Patient has a normal mood and affect. behavior is normal. Judgment and thought  content normal.   Recent Results (from the past 2160 hour(s))  POCT HgB A1C     Status: Abnormal   Collection Time: 06/15/21 11:22 AM  Result Value Ref Range   Hemoglobin A1C 6.8 (A) 4.0 - 5.6 %   HbA1c POC (<> result, manual entry)     HbA1c, POC (prediabetic range)     HbA1c, POC (controlled diabetic range)      PHQ2/9: Depression screen [facility] 2/9 06/15/2021 03/15/2021 02/12/2021 10/13/2020 08/22/2020  Decreased Interest 0 0 0 0 0  Down, Depressed, Hopeless 0 0 0 0 0  PHQ - 2 Score 0 0 0 0 0  Altered sleeping 0 0 - - -  Tired, decreased energy 0 0 - - -  Change in appetite 0 0 - - -  Feeling bad or failure about yourself  0 0 - - -  Trouble concentrating 0 0 - - -  Moving slowly or fidgety/restless 0 0 - - -  Suicidal thoughts 0 0 - - -  PHQ-9 Score 0 0 - - -  Difficult doing work/chores Not difficult at all Not difficult at all - - -  Some recent data might be hidden    phq 9 is negative   Fall Risk: Fall Risk  06/15/2021 03/15/2021 02/12/2021 10/13/2020 08/22/2020  Falls in the past year? 0 0 0 0 0  Number falls in past yr: 0 0 0 0 0  Injury with Fall? 0 0 0 0 0  Risk for fall due to : No Fall Risks - - - No Fall Risks  Follow up Falls prevention discussed - - - Falls prevention discussed      Functional Status Survey: Is the patient deaf or have difficulty hearing?: No Does the patient have difficulty seeing, even when wearing glasses/contacts?: No Does the patient have difficulty concentrating, remembering, or making decisions?: No Does the patient have difficulty walking or climbing stairs?:  No Does the patient have difficulty dressing or bathing?: No Does the patient have difficulty doing errands alone such as visiting a doctor's office or shopping?: No    Assessment & Plan  1. Type 2 diabetes with nephropathy (HCC)  - POCT HgB A1C - lisinopril (ZESTRIL) 10 MG tablet; Take 1 tablet (10 mg total) by mouth daily.  Dispense: 90 tablet; Refill: 1  2. Benign essential  HTN  Adjusting dose of lisinopril  - lisinopril (ZESTRIL) 10 MG tablet; Take 1 tablet (10 mg total) by mouth daily.  Dispense: 90 tablet; Refill: 1  3. B12 deficiency   4. Senile purpura ([place])  Reassurance given   5. Atherosclerosis of aorta ([place])  On statin therapy  6. Dyslipidemia   7. Hypertension associated with type 2 diabetes mellitus ([place])   8. Hx-TIA (transient ischemic attack)   9. Primary osteoarthritis of both knees   10. Cervical pain   11. Cervical paraspinal muscle spasm  - baclofen (LIORESAL) 10 MG tablet; Take 1 tablet (10 mg total) by mouth 3 (three) times daily.  Dispense: 30 each; Refill: 0

## 2021-06-15 ENCOUNTER — Ambulatory Visit (INDEPENDENT_AMBULATORY_CARE_PROVIDER_SITE_OTHER): Payer: Medicare HMO | Admitting: Family Medicine

## 2021-06-15 ENCOUNTER — Encounter: Payer: Self-pay | Admitting: Family Medicine

## 2021-06-15 VITALS — BP 140/74 | HR 97 | Temp 98.0°F | Resp 16 | Ht 64.0 in | Wt 197.0 lb

## 2021-06-15 DIAGNOSIS — M62838 Other muscle spasm: Secondary | ICD-10-CM

## 2021-06-15 DIAGNOSIS — Z8673 Personal history of transient ischemic attack (TIA), and cerebral infarction without residual deficits: Secondary | ICD-10-CM

## 2021-06-15 DIAGNOSIS — D692 Other nonthrombocytopenic purpura: Secondary | ICD-10-CM

## 2021-06-15 DIAGNOSIS — M542 Cervicalgia: Secondary | ICD-10-CM

## 2021-06-15 DIAGNOSIS — I10 Essential (primary) hypertension: Secondary | ICD-10-CM

## 2021-06-15 DIAGNOSIS — M170 Bilateral primary osteoarthritis of knee: Secondary | ICD-10-CM

## 2021-06-15 DIAGNOSIS — E1159 Type 2 diabetes mellitus with other circulatory complications: Secondary | ICD-10-CM

## 2021-06-15 DIAGNOSIS — E1121 Type 2 diabetes mellitus with diabetic nephropathy: Secondary | ICD-10-CM

## 2021-06-15 DIAGNOSIS — E785 Hyperlipidemia, unspecified: Secondary | ICD-10-CM

## 2021-06-15 DIAGNOSIS — I152 Hypertension secondary to endocrine disorders: Secondary | ICD-10-CM

## 2021-06-15 DIAGNOSIS — I700 Atherosclerosis of aorta: Secondary | ICD-10-CM | POA: Diagnosis not present

## 2021-06-15 DIAGNOSIS — E538 Deficiency of other specified B group vitamins: Secondary | ICD-10-CM

## 2021-06-15 LAB — POCT GLYCOSYLATED HEMOGLOBIN (HGB A1C): Hemoglobin A1C: 6.8 % — AB (ref 4.0–5.6)

## 2021-06-15 MED ORDER — LISINOPRIL 10 MG PO TABS: 10.0000 mg | ORAL_TABLET | Freq: Every day | ORAL | 1 refills | Status: DC

## 2021-06-15 MED ORDER — BACLOFEN 10 MG PO TABS: 10.0000 mg | ORAL_TABLET | Freq: Three times a day (TID) | ORAL | 0 refills | Status: DC

## 2021-06-21 ENCOUNTER — Other Ambulatory Visit: Payer: Self-pay

## 2021-06-21 ENCOUNTER — Encounter: Payer: Self-pay | Admitting: Physician Assistant

## 2021-06-21 ENCOUNTER — Ambulatory Visit: Payer: Medicare HMO | Admitting: Physician Assistant

## 2021-06-21 VITALS — BP 126/77 | HR 85 | Ht 64.0 in | Wt 197.0 lb

## 2021-06-21 DIAGNOSIS — R351 Nocturia: Secondary | ICD-10-CM | POA: Diagnosis not present

## 2021-06-21 DIAGNOSIS — R109 Unspecified abdominal pain: Secondary | ICD-10-CM | POA: Diagnosis not present

## 2021-06-21 DIAGNOSIS — Z87442 Personal history of urinary calculi: Secondary | ICD-10-CM

## 2021-06-21 MED ORDER — OXYBUTYNIN CHLORIDE 5 MG PO TABS: 5.0000 mg | ORAL_TABLET | Freq: Every day | ORAL | 0 refills | Status: DC

## 2021-06-21 NOTE — Progress Notes (Signed)
06/21/2021 12:41 PM   Christine Roberts 03-May-1946 [id number]  CC: Chief Complaint  Patient presents with   Nocturia   HPI: Christine Roberts is a 75 y.o. female with PMH nephrolithiasis and nocturia who presents today for symptom recheck on desmopressin.   Today she reports her nocturia reduced to twice nightly on desmopressin, however she developed some bothersome side effects on this medication including fluid retention, dizziness, and weird dreams and so she did not take it continuously.  Additionally, she reports a 1 day history of left flank pain and is concerned this may represent a stone episode.  She states that the pain is rather constant, but intermittently worsens.  She denies fever, chills, nausea, or vomiting.  She states that the pain is worse with ambulation, bending, and picking up heavy things.  She has a history of a right-sided renal stone.  PMH: Past Medical History:  Diagnosis Date   Diabetes mellitus without complication ([place])    Hyperlipidemia    Hypertension    Kidney stone    Left shoulder pain    Obesity    Osteoarthritis    Ovarian failure    Stroke ([place])    Vitamin B12 deficiency (non anemic)     Surgical History: Past Surgical History:  Procedure Laterality Date   ABDOMINAL HYSTERECTOMY     CHOLECYSTECTOMY     COLONOSCOPY  2015   COLONOSCOPY WITH PROPOFOL N/A 03/04/2018   Procedure: COLONOSCOPY WITH PROPOFOL;  Surgeon: Toledo, Benay Pike, MD;  Location: [facility] ENDOSCOPY;  Service: Gastroenterology;  Laterality: N/A;   cystoscopic     stent placed in ureter   LITHOTRIPSY  12/2007   MYRINGOTOMY Right 11/2009    Home Medications:  Allergies as of 06/21/2021       Reactions   Latex Rash   Erythromycin Other (See Comments)   Strawberry tongue        Medication List        Accurate as of June 21, 2021 12:41 PM. If you have any questions, ask your nurse or doctor.          STOP taking these medications    desmopressin 0.1 MG  tablet Commonly known as: DDAVP Stopped by: Debroah Loop, PA-C       TAKE these medications    Accu-Chek Aviva Plus test strip Generic drug: glucose blood TEST BLOOD SUGAR UP TO FOUR TIMES DAILY AS DIRECTED   Accu-Chek Softclix Lancets lancets 1 each by Other route daily.   aspirin EC 81 MG tablet Take 1 tablet (81 mg total) by mouth daily.   atorvastatin 40 MG tablet Commonly known as: LIPITOR TAKE 1 TABLET 2 TIMES A WEEK.   B-D SINGLE USE SWABS REGULAR Pads 1 each by Does not apply route daily.   baclofen 10 MG tablet Commonly known as: LIORESAL Take 1 tablet (10 mg total) by mouth 3 (three) times daily.   calcium citrate 950 (200 Ca) MG tablet Commonly known as: CALCITRATE - dosed in mg elemental calcium Take 200 mg of elemental calcium by mouth daily. Has D3 in it as well   CoQ10 100 MG Caps Take 1 capsule by mouth daily.   fluticasone 50 MCG/ACT nasal spray Commonly known as: FLONASE Place 2 sprays into both nostrils daily.   Insulin Pen Needle 30G X 8 MM Misc Commonly known as: NovoFine Inject 10 each into the skin as needed.   lisinopril 10 MG tablet Commonly known as: ZESTRIL Take 1 tablet (10 mg  total) by mouth daily.   metFORMIN 750 MG 24 hr tablet Commonly known as: GLUCOPHAGE-XR TAKE 1 TABLET EVERY DAY WITH BREAKFAST   oxybutynin 5 MG tablet Commonly known as: DITROPAN Take 1 tablet (5 mg total) by mouth at bedtime. Started by: Debroah Loop, PA-C   polyethylene glycol powder 17 GM/SCOOP powder Commonly known as: GLYCOLAX/MIRALAX Take 17 g by mouth 2 (two) times daily as needed.   Semaglutide (1 MG/DOSE) 4 MG/3ML Sopn Inject 1 mg as directed once a week. Patient receives through [employer] Patient Assistance through Dec 2022   [name] FlexTouch 200 UNIT/ML FlexTouch Pen Generic drug: insulin degludec Inject 28 Units into the skin daily. Patient receives through [employer] Patient Assistance through Dec 2022    VITAMIN B COMPLEX-C PO Take by mouth.   Vitamin D 125 MCG (5000 UT) Caps Take by mouth. Pt taking 3 times per week        Allergies:  Allergies  Allergen Reactions   Latex Rash   Erythromycin Other (See Comments)    Strawberry tongue    Family History: Family History  Problem Relation Age of Onset   Cancer Mother    Diabetes Mother    Diabetes Daughter    CAD Father    Diabetes Daughter        hypoglycemia    Social History:   reports that she has never smoked. She has never used smokeless tobacco. She reports current alcohol use of about 1.0 standard drink per week. She reports that she does not use drugs.  Physical Exam: BP 126/77    Pulse 85    Ht 5\' 4"  (1.626 m)    Wt 197 lb (89.4 kg)    BMI 33.81 kg/m   Constitutional:  Alert and oriented, no acute distress, nontoxic appearing HEENT: [place], AT Cardiovascular: No clubbing, cyanosis, or edema Respiratory: Normal respiratory effort, no increased work of breathing GU: No CVA tenderness MSK: Point tenderness over the left lumbar region Skin: No rashes, bruises or suspicious lesions Neurologic: Grossly intact, no focal deficits, moving all 4 extremities Psychiatric: Normal mood and affect  Assessment & Plan:   1. Nocturia Improved on desmopressin, however patient developed several side effects that are interfering with her taking it continuously.  At this point, I offered switching her to nighttime low-dose oxybutynin and she agreed.  We will plan for symptom recheck and PVR in 4 weeks.  BMP obtained today in the setting of her desmopressin trial, however will not need to continue to monitor this given that we are stopping the medication. - Basic metabolic panel - oxybutynin (DITROPAN) 5 MG tablet; Take 1 tablet (5 mg total) by mouth at bedtime.  Dispense: 30 tablet; Refill: 0  2. Flank pain with history of urolithiasis Low concern for acute stone episode today.  Her known stone is on the right side and she has left  sided pain today.  Additionally, we discussed that her positional exacerbation and point tenderness are more consistent with musculoskeletal pain.  Return in about 4 weeks (around 07/19/2021) for Symptom recheck with PVR.  Debroah Loop, PA-C  [facility] [address], [place], [place] [address] [phone]

## 2021-06-22 LAB — BASIC METABOLIC PANEL
BUN/Creatinine Ratio: 14 (ref 12–28)
BUN: 11 mg/dL (ref 8–27)
CO2: 24 mmol/L (ref 20–29)
Calcium: 9.4 mg/dL (ref 8.7–10.3)
Chloride: 100 mmol/L (ref 96–106)
Creatinine, Ser: 0.77 mg/dL (ref 0.57–1.00)
Glucose: 129 mg/dL — ABNORMAL HIGH (ref 70–99)
Potassium: 4.4 mmol/L (ref 3.5–5.2)
Sodium: 140 mmol/L (ref 134–144)
eGFR: 80 mL/min/{1.73_m2} (ref >59)

## 2021-07-17 ENCOUNTER — Telehealth: Payer: Self-pay

## 2021-07-17 NOTE — Progress Notes (Signed)
Chronic Care Management APPOINTMENT REMINDER:   Called Christine Roberts, No answer, left message of appointment on 07/18/2021 at 11:00 am via telephone visit with [name] , Pharm D. Notified to have all medications, supplements, blood pressure and/or blood sugar logs available during appointment and to return call if need to reschedule.     [place] Pharmacist Assistant [phone]

## 2021-07-18 ENCOUNTER — Ambulatory Visit (INDEPENDENT_AMBULATORY_CARE_PROVIDER_SITE_OTHER): Payer: Medicare HMO

## 2021-07-18 DIAGNOSIS — I10 Essential (primary) hypertension: Secondary | ICD-10-CM

## 2021-07-18 DIAGNOSIS — E1121 Type 2 diabetes mellitus with diabetic nephropathy: Secondary | ICD-10-CM

## 2021-07-18 NOTE — Patient Instructions (Signed)
Visit Information It was great speaking with you today!  Please let me know if you have any questions about our visit.   Goals Addressed             This Visit's Progress    Monitor and Manage My Blood Sugar-Diabetes Type 2   On track    Timeframe:  Long-Range Goal Priority:  High Start Date: 12/06/2020                            Expected End Date: 06/07/2022                      Follow Up within 90 days   - check blood sugar at prescribed times - check blood sugar if I feel it is too high or too low - take the blood sugar log to all doctor visits    Why is this important?   Checking your blood sugar at home helps to keep it from getting very high or very low.  Writing the results in a diary or log helps the doctor know how to care for you.  Your blood sugar log should have the time, date and the results.  Also, write down the amount of insulin or other medicine that you take.  Other information, like what you ate, exercise done and how you were feeling, will also be helpful.     Notes:         Patient Care Plan: General Pharmacy (Adult)     Problem Identified: Hypertension, Hyperlipidemia, Diabetes, Coronary Artery Disease, Osteoarthritis and History of TIA   Priority: High     Long-Range Goal: Patient-Specific Goal   Start Date: 12/06/2020  Expected End Date: 07/18/2022  This Visit's Progress: On track  Recent Progress: On track  Priority: High  Note:   Current Barriers:  Unable to independently afford treatment regimen  Pharmacist Clinical Goal(s):  Patient will verbalize ability to afford treatment regimen maintain control of diabetes as evidenced by A1c less than 8%  through collaboration with PharmD and provider.   Interventions: 1:1 collaboration with Steele Sizer, MD regarding development and update of comprehensive plan of care as evidenced by provider attestation and co-signature Inter-disciplinary care team collaboration (see longitudinal plan of  care) Comprehensive medication review performed; medication list updated in electronic medical record  Hypertension (BP goal <140/90) -Controlled -Current treatment: Lisinopril 10 mg daily  -Medications previously tried: NA  -Current home readings: Does not monitor -Current dietary habits: Does not eat often after 5pm. Largest meal is around 2pm, will have a small snack in the evening.  -Current exercise habits: [employer] work, previously was walking but not recently.  -Denies hypotensive/hypertensive symptoms -Recommended to continue current medication  Hyperlipidemia: (LDL goal < 70) -Controlled  -History of TIA  -Current treatment: Atorvastatin 40 mg twice weekly  -Current antiplatelet treatment: Aspirin 81 mg daily -Medications previously tried: NA  -Myalgias managable, continues to take CoQ10 supplementation which she feels helps her manage some of her symptoms. -Educated on Benefits of statin for ASCVD risk reduction; -Recommended to continue current medication  Diabetes (A1c goal <8%) -Controlled -Current medications: Metformin XR 750 mg daily: Appropriate, Effective, Safe, Accessible Ozempic 1 mg weekly: Appropriate, Effective, Safe, Accessible Tresiba 28 units daily (0.31 u/kg): Appropriate, Effective, Safe, Accessible -Medications previously tried: NA  -Current home glucose readings fasting glucose: 106, 108, 124, 88, 115, 79 (tooth procedure), 90 -Continue current medications  Nocturia (  Goal: Minimize symptoms) -Not ideally controlled -Current treatment  None -Medications previously tried: Desmopressin (fluid retention, dizziness, and weird dreams ), Oxybutynin (insomnia)   --Wakes up 2-3 times nightly to use restroom. Feels side effects from trialed medications were not tolerable.  -Counseled patient to limit fluid intake in the evening, scheduled voiding.   Patient Goals/Self-Care Activities Patient will:  - check glucose daily, document, and provide at future  appointments check blood pressure weekly, document, and provide at future appointments  Follow Up Plan: Telephone follow up appointment with care management team member scheduled for:  11/07/2021 at 10:15 AM      Patient agreed to services and verbal consent obtained.   Patient verbalizes understanding of instructions and care plan provided today and agrees to view in [place]. Active MyChart status confirmed with patient.    Malva Limes, [place] Pharmacist Practitioner  [facility] [phone]

## 2021-07-18 NOTE — Progress Notes (Signed)
Chronic Care Management Pharmacy Note  07/18/2021 Name:  Christine Roberts MRN:  [id number] DOB:  [birth year]  Summary: Patient presents for CCM follow-up. Blood sugars well controlled, she does not monitor home blood pressures. She has stopped desmopressin, oxybutynin, and baclofen due to intolerable side effects.   Recommendations/Changes made from today's visit: -Continue current medications  Subjective: Christine Roberts is an 76 y.o. year old female who is a primary patient of Steele Sizer, MD.  The CCM team was consulted for assistance with disease management and care coordination needs.    Engaged with patient by telephone for follow up visit in response to provider referral for pharmacy case management and/or care coordination services.   Consent to Services:  The patient was given information about Chronic Care Management services, agreed to services, and gave verbal consent prior to initiation of services.  Please see initial visit note for detailed documentation.   Patient Care Team: Steele Sizer, MD as PCP - General (Family Medicine) Dingeldein, Remo Lipps, MD as Consulting Physician (Ophthalmology) Randa Spike, MD as Consulting Physician (Dermatology) Billey Co, MD as Consulting Physician (Urology) Efrain Sella, MD as Consulting Physician (Gastroenterology) Herbert Pun, MD as Consulting Physician (General Surgery) Germaine Pomfret, [facility] (Pharmacist)  Recent office visits: 06/15/21: Patient presented to Dr. Ancil Boozer for follow-up. A1c 6.8%. BP 140/74, lisinopril increased to 10 mg daily. Baclofen 10 mg three times daily  03/15/21: Patient presented to Serafina Royals, FNP for ear pain. Ofloxacin 0.3% started.  02/12/2021: Patient presented to Dr. Ancil Boozer for follow-up. A1c 7.2%. BP 130/80.  Recent consult visits: 06/21/21: Patient presented to Debroah Loop, PA-C (Urology) for flank pain. Oxybutynin 5 mg nightly  05/24/21: Patient presented to Debroah Loop, PA-C (Urology) for nocturia. Desmopression 0.05 mg nightly.   Hospital visits: None in previous 6 months   Objective:  Lab Results  Component Value Date   CREATININE 0.77 06/21/2021   BUN 11 06/21/2021   GFRNONAA 59 (L) 02/09/2020   GFRAA 69 02/09/2020   NA 140 06/21/2021   K 4.4 06/21/2021   CALCIUM 9.4 06/21/2021   CO2 24 06/21/2021   GLUCOSE 129 (H) 06/21/2021    Lab Results  Component Value Date/Time   HGBA1C 6.8 (A) 06/15/2021 11:22 AM   HGBA1C 7.2 (A) 02/12/2021 02:02 PM   HGBA1C 7.7 (H) 11/23/2018 08:05 AM   HGBA1C 9.1 (A) 07/21/2018 11:48 AM   HGBA1C 8.1 (A) 03/30/2018 04:05 PM   MICROALBUR 0.3 02/12/2021 02:34 PM   MICROALBUR 0.4 02/09/2020 11:17 AM   MICROALBUR 20 01/10/2017 01:39 PM   MICROALBUR 20 09/12/2015 03:42 PM    Last diabetic Eye exam:  Lab Results  Component Value Date/Time   HMDIABEYEEXA No Retinopathy 04/01/2018 12:00 AM    Last diabetic Foot exam: No results found for: HMDIABFOOTEX   Lab Results  Component Value Date   CHOL 147 02/12/2021   HDL 63 02/12/2021   LDLCALC 68 02/12/2021   TRIG 82 02/12/2021   CHOLHDL 2.3 02/12/2021    Hepatic Function Latest Ref Rng & Units 02/12/2021 02/09/2020 11/23/2018  Total Protein 6.1 - 8.1 g/dL 6.6 7.0 6.3  Albumin 3.5 - 5.0 g/dL - - -  AST 10 - 35 U/L _0 ALT 6 - 29 U/L _1 Alk Phosphatase 38 - 126 U/L - - -  Total Bilirubin 0.2 - 1.2 mg/dL 0.5 0.5 0.4    Lab Results  Component Value Date/Time   TSH 1.290 04/04/2015 08:10  AM    CBC Latest Ref Rng & Units 02/12/2021 02/09/2020 11/23/2018  WBC 3.8 - 10.8 Thousand/uL 9.9 9.0 7.4  Hemoglobin 11.7 - 15.5 g/dL 12.2 12.4 12.0  Hematocrit 35.0 - 45.0 % 37.4 38.4 36.1  Platelets 140 - 400 Thousand/uL 324 299 272    Lab Results  Component Value Date/Time   VD25OH 34.4 04/04/2015 08:10 AM    Clinical ASCVD: Yes  The 10-year ASCVD risk score (Arnett DK, et al., 2019) is: 54.6%   Values used to calculate the score:     Age:  68 years     Sex: Female     Is Non-Hispanic African American: No     Diabetic: Yes     Tobacco smoker: Yes     Systolic Blood Pressure: 631 mmHg     Is BP treated: Yes     HDL Cholesterol: 63 mg/dL     Total Cholesterol: 147 mg/dL    Depression screen [facility] 2/9 06/15/2021 03/15/2021 02/12/2021  Decreased Interest 0 0 0  Down, Depressed, Hopeless 0 0 0  PHQ - 2 Score 0 0 0  Altered sleeping 0 0 -  Tired, decreased energy 0 0 -  Change in appetite 0 0 -  Feeling bad or failure about yourself  0 0 -  Trouble concentrating 0 0 -  Moving slowly or fidgety/restless 0 0 -  Suicidal thoughts 0 0 -  PHQ-9 Score 0 0 -  Difficult doing work/chores Not difficult at all Not difficult at all -  Some recent data might be hidden    Social History   Tobacco Use  Smoking Status Never  Smokeless Tobacco Never  Tobacco Comments   smoking cessation materials not required   BP Readings from Last 3 Encounters:  06/21/21 126/77  06/15/21 140/74  05/24/21 (!) 146/82   Pulse Readings from Last 3 Encounters:  06/21/21 85  06/15/21 97  05/24/21 92   Wt Readings from Last 3 Encounters:  06/21/21 197 lb (89.4 kg)  06/15/21 197 lb (89.4 kg)  05/24/21 192 lb (87.1 kg)   BMI Readings from Last 3 Encounters:  06/21/21 33.81 kg/m  06/15/21 33.81 kg/m  05/24/21 32.96 kg/m    Assessment/Interventions: Review of patient past medical history, allergies, medications, health status, including review of consultants reports, laboratory and other test data, was performed as part of comprehensive evaluation and provision of chronic care management services.   SDOH:  (Social Determinants of Health) assessments and interventions performed: Yes SDOH Interventions    Flowsheet Row Most Recent Value  SDOH Interventions   Financial Strain Interventions Other (Comment)  [PAP]        SDOH Screenings   Alcohol Screen: Not on file  Depression (PHQ2-9): Low Risk    PHQ-2 Score: 0  Financial Resource  Strain: Low Risk    Difficulty of Paying Living Expenses: Not very hard  Food Insecurity: No Food Insecurity   Worried About Charity fundraiser in the Last Year: Never true   Ran Out of Food in the Last Year: Never true  Housing: Low Risk    Last Housing Risk Score: 0  Physical Activity: Insufficiently Active   Days of Exercise per Week: 3 days   Minutes of Exercise per Session: 30 min  Social Connections: Moderately Isolated   Frequency of Communication with Friends and Family: More than three times a week   Frequency of Social Gatherings with Friends and Family: Three times a week   Attends Religious  Services: More than 4 times per year   Active Member of Clubs or Organizations: No   Attends Archivist Meetings: Never   Marital Status: Widowed  Stress: No Stress Concern Present   Feeling of Stress : Only a little  Tobacco Use: Low Risk    Smoking Tobacco Use: Never   Smokeless Tobacco Use: Never   Passive Exposure: Not on file  Transportation Needs: No Transportation Needs   Lack of Transportation (Medical): No   Lack of Transportation (Non-Medical): No    CCM Care Plan  Allergies  Allergen Reactions   Latex Rash   Erythromycin Other (See Comments)    Strawberry tongue    Medications Reviewed Today     Reviewed by Germaine Pomfret, [facility] (Pharmacist) on 07/18/21 at 1121  Med List Status: <None>   Medication Order Taking? Sig Documenting Provider Last Dose Status Informant  ACCU-CHEK SOFTCLIX LANCETS lancets [id number]  1 each by Other route daily. Steele Sizer, MD  Active   acetaminophen (TYLENOL) 650 MG CR tablet [id number] Yes Take 650 mg by mouth every 8 (eight) hours as needed for pain. [provider] Taking Active   Alcohol Swabs (B-D SINGLE USE SWABS REGULAR) PADS [id number]  1 each by Does not apply route daily. Steele Sizer, MD  Active   aspirin EC 81 MG tablet [id number]  Take 1 tablet (81 mg total) by mouth daily. Steele Sizer, MD   Active Self  atorvastatin (LIPITOR) 40 MG tablet [id number]  TAKE 1 TABLET 2 TIMES A WEEK. Steele Sizer, MD  Active   calcium citrate (CALCITRATE - DOSED IN MG ELEMENTAL CALCIUM) 950 MG tablet [id number]  Take 200 mg of elemental calcium by mouth daily. Has D3 in it as well [provider]  Active Self  Cholecalciferol (VITAMIN D) 125 MCG (5000 UT) CAPS [id number]  Take by mouth. Pt taking 3 times per week [provider]  Active   Coenzyme Q10 (COQ10) 100 MG CAPS [id number]  Take 1 capsule by mouth daily. [provider]  Active   fluticasone (FLONASE) 50 MCG/ACT nasal spray [id number]  Place 2 sprays into both nostrils daily. Steele Sizer, MD  Active            Med Note Barbarann Ehlers Apr 12, 2021  2:47 PM)    glucose blood (ACCU-CHEK AVIVA PLUS) test strip [id number]  TEST BLOOD SUGAR UP TO FOUR TIMES DAILY AS DIRECTED Steele Sizer, MD  Active   insulin degludec (TRESIBA FLEXTOUCH) 200 UNIT/ML FlexTouch Pen [id number] Yes Inject 28 Units into the skin daily. Patient receives through [employer] Patient Assistance through Dec 2022 Ancil Boozer, Drue Stager, MD Taking Active   Insulin Pen Needle (NOVOFINE) 30G X 8 MM MISC [id number]  Inject 10 each into the skin as needed. Steele Sizer, MD  Active   lisinopril (ZESTRIL) 10 MG tablet [id number] Yes Take 1 tablet (10 mg total) by mouth daily. Steele Sizer, MD Taking Active   metFORMIN (GLUCOPHAGE-XR) 750 MG 24 hr tablet [id number] Yes TAKE 1 TABLET EVERY DAY WITH [place] [employer], Drue Stager, MD Taking Active   polyethylene glycol powder (GLYCOLAX/MIRALAX) powder [id number]  Take 17 g by mouth 2 (two) times daily as needed. Steele Sizer, MD  Active   Semaglutide, 1 MG/DOSE, 4 MG/3ML Bonney Aid [id number]  Inject 1 mg as directed once a week. Patient receives through [employer] Patient Assistance through Dec 2022 Steele Sizer, MD  Active   VITAMIN B COMPLEX-C PO [id number]  Take by  mouth. [provider]  Active              Patient Active Problem List   Diagnosis Date Noted   Atherosclerosis of aorta ([place]) 05/25/2018   Seasonal allergic rhinitis 09/12/2015   TIA (transient ischemic attack) 05/24/2015   Senile purpura ([place]) 05/09/2015   Benign essential HTN 03/18/2015   Pain in shoulder 03/18/2015   Dyslipidemia 03/18/2015   History of surgery to major organs, presenting hazards to health 03/18/2015   Hearing loss 03/18/2015   Hiatal hernia 03/18/2015   History of colon polyps 03/18/2015   Calculus of kidney 03/18/2015   B12 deficiency 03/18/2015   Microalbuminuria 03/18/2015   Adult BMI 30+ 03/18/2015   Arthritis, degenerative 03/18/2015   Diabetes mellitus with nephropathy ([place]) 03/18/2015    Immunization History  Administered Date(s) Administered   Fluad Quad(high Dose 65+) 03/24/2019   Influenza Split 04/11/2009   Influenza, High Dose Seasonal PF 03/21/2015, 05/10/2016, 03/30/2017, 03/30/2018   Influenza, Seasonal, Injecte, Preservative Fre 04/07/2012   Influenza,inj,Quad PF,6+ Mos 03/07/2014   Influenza-Unspecified 03/07/2014, 03/30/2017, 05/15/2020, 05/30/2021   PFIZER(Purple Top)SARS-COV-2 Vaccination 07/15/2019, 08/05/2019, 06/27/2020   Pneumococcal Conjugate-13 03/21/2015   Pneumococcal Polysaccharide-23 10/25/2009   Tdap 10/25/2009, 03/14/2018   Zoster, Live 09/24/2011    Conditions to be addressed/monitored:  Hypertension, Hyperlipidemia, Diabetes, Coronary Artery Disease, Osteoarthritis and History of TIA  Care Plan : General Pharmacy (Adult)  Updates made by Germaine Pomfret, RPH since 07/18/2021 12:00 AM     Problem: Hypertension, Hyperlipidemia, Diabetes, Coronary Artery Disease, Osteoarthritis and History of TIA   Priority: High     Long-Range Goal: Patient-Specific Goal   Start Date: 12/06/2020  Expected End Date: 07/18/2022  This Visit's Progress: On track  Recent Progress: On track  Priority: High  Note:   Current Barriers:  Unable to  independently afford treatment regimen  Pharmacist Clinical Goal(s):  Patient will verbalize ability to afford treatment regimen maintain control of diabetes as evidenced by A1c less than 8%  through collaboration with PharmD and provider.   Interventions: 1:1 collaboration with Steele Sizer, MD regarding development and update of comprehensive plan of care as evidenced by provider attestation and co-signature Inter-disciplinary care team collaboration (see longitudinal plan of care) Comprehensive medication review performed; medication list updated in electronic medical record  Hypertension (BP goal <140/90) -Controlled -Current treatment: Lisinopril 10 mg daily  -Medications previously tried: NA  -Current home readings: Does not monitor -Current dietary habits: Does not eat often after 5pm. Largest meal is around 2pm, will have a small snack in the evening.  -Current exercise habits: [employer] work, previously was walking but not recently.  -Denies hypotensive/hypertensive symptoms -Recommended to continue current medication  Hyperlipidemia: (LDL goal < 70) -Controlled  -History of TIA  -Current treatment: Atorvastatin 40 mg twice weekly  -Current antiplatelet treatment: Aspirin 81 mg daily -Medications previously tried: NA  -Myalgias managable, continues to take CoQ10 supplementation which she feels helps her manage some of her symptoms. -Educated on Benefits of statin for ASCVD risk reduction; -Recommended to continue current medication  Diabetes (A1c goal <8%) -Controlled -Current medications: Metformin XR 750 mg daily: Appropriate, Effective, Safe, Accessible Ozempic 1 mg weekly: Appropriate, Effective, Safe, Accessible Tresiba 28 units daily (0.31 u/kg): Appropriate, Effective, Safe, Accessible -Medications previously tried: NA  -Current home glucose readings fasting glucose: 106, 108, 124, 88, 115, 79 (tooth procedure), 90 -Continue current medications  Nocturia  (Goal: Minimize symptoms) -Not ideally controlled -Current treatment  None -Medications previously tried: Desmopressin (  fluid retention, dizziness, and weird dreams ), Oxybutynin (insomnia)   --Wakes up 2-3 times nightly to use restroom. Feels side effects from trialed medications were not tolerable.  -Counseled patient to limit fluid intake in the evening, scheduled voiding.   Patient Goals/Self-Care Activities Patient will:  - check glucose daily, document, and provide at future appointments check blood pressure weekly, document, and provide at future appointments  Follow Up Plan: Telephone follow up appointment with care management team member scheduled for:  11/07/2021 at 10:15 AM    Medication Assistance:  Ozempic and [place] obtained through [employer] medication assistance program.  Enrollment ends Dec 2023  Compliance/Adherence/Medication fill history: Care Gaps: [facility] Ophthalmology Exam  Star-Rating Drugs: Atorvastatin 40 mg twice weekly: Last filled 10/05/20 for 84-DS Lisinopril 5 mg daily: Last filled 10/06/20 for 90-DS Metformin XR 750 mg daily: Last filled 08/21/20 and 06/14/20 for 90-DS.   Patient's preferred pharmacy is:  [facility], [address] [place] [address] Phone: [phone] Fax: [phone]  [facility] [address], [place] - [place] [address] Phone: [phone] Fax: [phone]   Uses pill box? Yes Pt endorses 100% compliance  We discussed: Current pharmacy is preferred with insurance plan and patient is satisfied with pharmacy services Patient decided to: Continue current medication management strategy  Care Plan and Follow Up Patient Decision:  Patient agrees to Care Plan and Follow-up.  Plan: Telephone follow up appointment with care management team member scheduled for:  11/07/2021 at 10:15 AM  Malva Limes,  [facility] [phone]

## 2021-07-23 ENCOUNTER — Ambulatory Visit: Payer: Medicare HMO | Admitting: Physician Assistant

## 2021-08-02 ENCOUNTER — Ambulatory Visit: Payer: Medicare HMO | Admitting: Urology

## 2021-08-07 DIAGNOSIS — I10 Essential (primary) hypertension: Secondary | ICD-10-CM | POA: Diagnosis not present

## 2021-08-07 DIAGNOSIS — E1121 Type 2 diabetes mellitus with diabetic nephropathy: Secondary | ICD-10-CM

## 2021-08-22 ENCOUNTER — Telehealth: Payer: Self-pay

## 2021-08-22 NOTE — Telephone Encounter (Signed)
Left message to let patient know her medications (Ozempic and [place]) are here and she can pick them up at her earliest convenience.

## 2021-08-23 ENCOUNTER — Ambulatory Visit (INDEPENDENT_AMBULATORY_CARE_PROVIDER_SITE_OTHER): Payer: Medicare HMO

## 2021-08-23 VITALS — BP 120/80 | HR 98 | Temp 98.1°F | Resp 16 | Ht 64.0 in | Wt 192.8 lb

## 2021-08-23 DIAGNOSIS — Z0000 Encounter for general adult medical examination without abnormal findings: Secondary | ICD-10-CM | POA: Diagnosis not present

## 2021-08-23 NOTE — Patient Instructions (Signed)
Christine Roberts , Thank you for taking time to come for your Medicare Wellness Visit. I appreciate your ongoing commitment to your health goals. Please review the following plan we discussed and let me know if I can assist you in the future.   Screening recommendations/referrals: Colonoscopy: done 03/04/18. Repeat 02/2023 Mammogram: done 10/31/20 Bone Density: done 10/31/20 Recommended yearly ophthalmology/optometry visit for glaucoma screening and checkup Recommended yearly dental visit for hygiene and checkup  Vaccinations: Influenza vaccine: done 05/30/21 Pneumococcal vaccine: done 03/21/15 Tdap vaccine: done 03/14/18 Shingles vaccine: Shingrix discussed. Please contact your pharmacy for coverage information.  Covid-19:done 07/15/19, 08/05/19, 06/27/20  Advanced directives: Please bring a copy of your health care power of attorney and living will to the office at your convenience.   Conditions/risks identified: recommend increasing physical activity   Next appointment: Follow up in one year for your annual wellness visit    Preventive Care 65 Years and Older, Female Preventive care refers to lifestyle choices and visits with your health care provider that can promote health and wellness. What does preventive care include? A yearly physical exam. This is also called an annual well check. Dental exams once or twice a year. Routine eye exams. Ask your health care provider how often you should have your eyes checked. Personal lifestyle choices, including: Daily care of your teeth and gums. Regular physical activity. Eating a healthy diet. Avoiding tobacco and drug use. Limiting alcohol use. Practicing safe sex. Taking low-dose aspirin every day. Taking vitamin and mineral supplements as recommended by your health care provider. What happens during an annual well check? The services and screenings done by your health care provider during your annual well check will depend on your age, overall  health, lifestyle risk factors, and family history of disease. Counseling  Your health care provider may ask you questions about your: Alcohol use. Tobacco use. Drug use. Emotional well-being. Home and relationship well-being. Sexual activity. Eating habits. History of falls. Memory and ability to understand (cognition). Work and work Statistician. Reproductive health. Screening  You may have the following tests or measurements: Height, weight, and BMI. Blood pressure. Lipid and cholesterol levels. These may be checked every 5 years, or more frequently if you are over 28 years old. Skin check. Lung cancer screening. You may have this screening every year starting at age 38 if you have a 30-pack-year history of smoking and currently smoke or have quit within the past 15 years. Fecal occult blood test (FOBT) of the stool. You may have this test every year starting at age 1. Flexible sigmoidoscopy or colonoscopy. You may have a sigmoidoscopy every 5 years or a colonoscopy every 10 years starting at age 71. Hepatitis C blood test. Hepatitis B blood test. Sexually transmitted disease (STD) testing. Diabetes screening. This is done by checking your blood sugar (glucose) after you have not eaten for a while (fasting). You may have this done every 1-3 years. Bone density scan. This is done to screen for osteoporosis. You may have this done starting at age 77. Mammogram. This may be done every 1-2 years. Talk to your health care provider about how often you should have regular mammograms. Talk with your health care provider about your test results, treatment options, and if necessary, the need for more tests. Vaccines  Your health care provider may recommend certain vaccines, such as: Influenza vaccine. This is recommended every year. Tetanus, diphtheria, and acellular pertussis (Tdap, Td) vaccine. You may need a Td booster every 10 years. Zoster vaccine.  You may need this after age  36. Pneumococcal 13-valent conjugate (PCV13) vaccine. One dose is recommended after age 33. Pneumococcal polysaccharide (PPSV23) vaccine. One dose is recommended after age 57. Talk to your health care provider about which screenings and vaccines you need and how often you need them. This information is not intended to replace advice given to you by your health care provider. Make sure you discuss any questions you have with your health care provider. Document Released: 07/21/2015 Document Revised: 03/13/2016 Document Reviewed: 04/25/2015 Elsevier Interactive Patient Education  2017 [employer] Prevention in the Home Falls can cause injuries. They can happen to people of all ages. There are many things you can do to make your home safe and to help prevent falls. What can I do on the outside of my home? Regularly fix the edges of walkways and driveways and fix any cracks. Remove anything that might make you trip as you walk through a door, such as a raised step or threshold. Trim any bushes or trees on the path to your home. Use bright outdoor lighting. Clear any walking paths of anything that might make someone trip, such as rocks or tools. Regularly check to see if handrails are loose or broken. Make sure that both sides of any steps have handrails. Any raised decks and porches should have guardrails on the edges. Have any leaves, snow, or ice cleared regularly. Use sand or salt on walking paths during winter. Clean up any spills in your garage right away. This includes oil or grease spills. What can I do in the bathroom? Use night lights. Install grab bars by the toilet and in the tub and shower. Do not use towel bars as grab bars. Use non-skid mats or decals in the tub or shower. If you need to sit down in the shower, use a plastic, non-slip stool. Keep the floor dry. Clean up any water that spills on the floor as soon as it happens. Remove soap buildup in the tub or shower  regularly. Attach bath mats securely with double-sided non-slip rug tape. Do not have throw rugs and other things on the floor that can make you trip. What can I do in the bedroom? Use night lights. Make sure that you have a light by your bed that is easy to reach. Do not use any sheets or blankets that are too big for your bed. They should not hang down onto the floor. Have a firm chair that has side arms. You can use this for support while you get dressed. Do not have throw rugs and other things on the floor that can make you trip. What can I do in the kitchen? Clean up any spills right away. Avoid walking on wet floors. Keep items that you use a lot in easy-to-reach places. If you need to reach something above you, use a strong step stool that has a grab bar. Keep electrical cords out of the way. Do not use floor polish or wax that makes floors slippery. If you must use wax, use non-skid floor wax. Do not have throw rugs and other things on the floor that can make you trip. What can I do with my stairs? Do not leave any items on the stairs. Make sure that there are handrails on both sides of the stairs and use them. Fix handrails that are broken or loose. Make sure that handrails are as long as the stairways. Check any carpeting to make sure that it is  firmly attached to the stairs. Fix any carpet that is loose or worn. Avoid having throw rugs at the top or bottom of the stairs. If you do have throw rugs, attach them to the floor with carpet tape. Make sure that you have a light switch at the top of the stairs and the bottom of the stairs. If you do not have them, ask someone to add them for you. What else can I do to help prevent falls? Wear shoes that: Do not have high heels. Have rubber bottoms. Are comfortable and fit you well. Are closed at the toe. Do not wear sandals. If you use a stepladder: Make sure that it is fully opened. Do not climb a closed stepladder. Make sure that  both sides of the stepladder are locked into place. Ask someone to hold it for you, if possible. Clearly mark and make sure that you can see: Any grab bars or handrails. First and last steps. Where the edge of each step is. Use tools that help you move around (mobility aids) if they are needed. These include: Canes. Walkers. Scooters. Crutches. Turn on the lights when you go into a dark area. Replace any light bulbs as soon as they burn out. Set up your furniture so you have a clear path. Avoid moving your furniture around. If any of your floors are uneven, fix them. If there are any pets around you, be aware of where they are. Review your medicines with your doctor. Some medicines can make you feel dizzy. This can increase your chance of falling. Ask your doctor what other things that you can do to help prevent falls. This information is not intended to replace advice given to you by your health care provider. Make sure you discuss any questions you have with your health care provider. Document Released: 04/20/2009 Document Revised: 11/30/2015 Document Reviewed: 07/29/2014 Elsevier Interactive Patient Education  2017 [employer].

## 2021-08-23 NOTE — Progress Notes (Signed)
Subjective:   Christine Roberts is a 76 y.o. female who presents for Medicare Annual (Subsequent) preventive examination.  Review of Systems     Cardiac Risk Factors include: advanced age (>69men, >72 women);diabetes mellitus;dyslipidemia;hypertension;obesity (BMI >30kg/m2)     Objective:    Today's Vitals   08/23/21 1446  BP: 120/80  Pulse: 98  Resp: 16  Temp: 98.1 F (36.7 C)  TempSrc: Oral  SpO2: 98%  Weight: 192 lb 12.8 oz (87.5 kg)  Height: 5\' 4"  (1.626 m)   Body mass index is 33.09 kg/m.  Advanced Directives 08/23/2021 08/22/2020 07/27/2019 12/31/2018 07/21/2018 05/22/2018 03/14/2018  Does Patient Have a Medical Advance Directive? Yes Yes Yes No Yes No No  Type of Paramedic of [place];Living will [place];Living will [place];Living will - [place];Living will - -  Does patient want to make changes to medical advance directive? - - - - - - -  Copy of [place] in Chart? No - copy requested No - copy requested No - copy requested - No - copy requested - -  Would patient like information on creating a medical advance directive? - - - No - Patient declined - No - Patient declined No - Patient declined    Current Medications (verified) Outpatient Encounter Medications as of 08/23/2021  Medication Sig   ACCU-CHEK SOFTCLIX LANCETS lancets 1 each by Other route daily.   acetaminophen (TYLENOL) 650 MG CR tablet Take 650 mg by mouth every 8 (eight) hours as needed.   Alcohol Swabs (B-D SINGLE USE SWABS REGULAR) PADS 1 each by Does not apply route daily.   aspirin EC 81 MG tablet Take 1 tablet (81 mg total) by mouth daily.   atorvastatin (LIPITOR) 40 MG tablet TAKE 1 TABLET 2 TIMES A WEEK.   calcium citrate (CALCITRATE - DOSED IN MG ELEMENTAL CALCIUM) 950 MG tablet Take 200 mg of elemental calcium by mouth daily. Has D3 in it as well   Cholecalciferol (VITAMIN D) 125 MCG (5000 UT)  CAPS Take by mouth. Pt taking 3 times per week   Coenzyme Q10 (COQ10) 100 MG CAPS Take 1 capsule by mouth daily.   fluticasone (FLONASE) 50 MCG/ACT nasal spray Place 2 sprays into both nostrils daily.   glucose blood (ACCU-CHEK AVIVA PLUS) test strip TEST BLOOD SUGAR UP TO FOUR TIMES DAILY AS DIRECTED   insulin degludec (TRESIBA FLEXTOUCH) 200 UNIT/ML FlexTouch Pen Inject 28 Units into the skin daily. Patient receives through [employer] Patient Assistance through Dec 2022   Insulin Pen Needle (NOVOFINE) 30G X 8 MM MISC Inject 10 each into the skin as needed.   lisinopril (ZESTRIL) 10 MG tablet Take 1 tablet (10 mg total) by mouth daily.   metFORMIN (GLUCOPHAGE-XR) 750 MG 24 hr tablet TAKE 1 TABLET EVERY DAY WITH BREAKFAST   polyethylene glycol powder (GLYCOLAX/MIRALAX) powder Take 17 g by mouth 2 (two) times daily as needed.   Semaglutide, 1 MG/DOSE, 4 MG/3ML SOPN Inject 1 mg as directed once a week. Patient receives through [employer] Patient Assistance through Dec 2022   VITAMIN B COMPLEX-C PO Take by mouth.   No facility-administered encounter medications on file as of 08/23/2021.    Allergies (verified) Latex and Erythromycin   History: Past Medical History:  Diagnosis Date   Diabetes mellitus without complication ([place])    Hyperlipidemia    Hypertension    Kidney stone    Left shoulder pain  Obesity    Osteoarthritis    Ovarian failure    Stroke ([place])    Vitamin B12 deficiency (non anemic)    Past Surgical History:  Procedure Laterality Date   ABDOMINAL HYSTERECTOMY     CHOLECYSTECTOMY     COLONOSCOPY  2015   COLONOSCOPY WITH PROPOFOL N/A 03/04/2018   Procedure: COLONOSCOPY WITH PROPOFOL;  Surgeon: Toledo, Benay Pike, MD;  Location: [facility] ENDOSCOPY;  Service: Gastroenterology;  Laterality: N/A;   cystoscopic     stent placed in ureter   LITHOTRIPSY  12/2007   MYRINGOTOMY Right 11/2009   Family History  Problem Relation Age of Onset   Cancer Mother    Diabetes  Mother    Diabetes Daughter    CAD Father    Diabetes Daughter        hypoglycemia   Social History   Socioeconomic History   Marital status: Widowed    Spouse name: Not on file   Number of children: 2   Years of education: Not on file   Highest education level: Associate degree: occupational, Hotel manager, or vocational program  Occupational History    Employer: [employer]    Comment: retired  Tobacco Use   Smoking status: Never   Smokeless tobacco: Never   Tobacco comments:    smoking cessation materials not required  Vaping Use   Vaping Use: Never used  Substance and Sexual Activity   Alcohol use: Yes    Alcohol/week: 1.0 standard drink    Types: 1 Shots of liquor per week    Comment: socially   Drug use: No   Sexual activity: Not Currently  Other Topics Concern   Not on file  Social History Narrative   Pt lives alone   Social Determinants of Health   Financial Resource Strain: Low Risk    Difficulty of Paying Living Expenses: Not very hard  Food Insecurity: No Food Insecurity   Worried About Charity fundraiser in the Last Year: Never true   Ran Out of Food in the Last Year: Never true  Transportation Needs: No Transportation Needs   Lack of Transportation (Medical): No   Lack of Transportation (Non-Medical): No  Physical Activity: Inactive   Days of Exercise per Week: 0 days   Minutes of Exercise per Session: 0 min  Stress: No Stress Concern Present   Feeling of Stress : Not at all  Social Connections: Moderately Isolated   Frequency of Communication with Friends and Family: More than three times a week   Frequency of Social Gatherings with Friends and Family: Three times a week   Attends Religious Services: More than 4 times per year   Active Member of Clubs or Organizations: No   Attends Archivist Meetings: Never   Marital Status: Widowed    Tobacco Counseling Counseling given: Not Answered Tobacco comments: smoking  cessation materials not required   Clinical Intake:  Pre-visit preparation completed: Yes  Pain : No/denies pain     BMI - recorded: 33.09 Nutritional Status: BMI > 30  Obese Nutritional Risks: None Diabetes: Yes CBG done?: No Did pt. bring in CBG monitor from home?: No  How often do you need to have someone help you when you read instructions, pamphlets, or other written materials from your doctor or pharmacy?: 1 - Never  Nutrition Risk Assessment:  Has the patient had any N/V/D within the last 2 months?  No  Does the patient have any non-healing wounds?  No  Has  the patient had any unintentional weight loss or weight gain?  No   Diabetes:  Is the patient diabetic?  Yes  If diabetic, was a CBG obtained today?  No  Did the patient bring in their glucometer from home?  No  How often do you monitor your CBG's? daily.   Financial Strains and Diabetes Management:  Are you having any financial strains with the device, your supplies or your medication? No .  Does the patient want to be seen by Chronic Care Management for management of their diabetes?  No  Would the patient like to be referred to a Nutritionist or for Diabetic Management?  No   Diabetic Exams:  Diabetic Eye Exam: Completed 2019.   Diabetic Foot Exam: Completed 10/13/20.   Interpreter Needed?: No  Information entered by :: Clemetine Marker LPN   Activities of Daily Living In your present state of health, do you have any difficulty performing the following activities: 08/23/2021 06/15/2021  Hearing? N N  Vision? N N  Difficulty concentrating or making decisions? N N  Walking or climbing stairs? N N  Dressing or bathing? N N  Doing errands, shopping? N N  Preparing Food and eating ? N -  Using the Toilet? N -  In the past six months, have you accidently leaked urine? N -  Do you have problems with loss of bowel control? N -  Managing your Medications? N -  Managing your Finances? N -  Housekeeping or  managing your Housekeeping? N -  Some recent data might be hidden    Patient Care Team: Steele Sizer, MD as PCP - General (Family Medicine) Dingeldein, Remo Lipps, MD as Consulting Physician (Ophthalmology) Billey Co, MD as Consulting Physician (Urology) Efrain Sella, MD as Consulting Physician (Gastroenterology) Germaine Pomfret, [facility] (Pharmacist) Debroah Loop, PA-C as Physician Assistant (Urology)  Indicate any recent Medical Services you may have received from other than [facility] providers in the past year (date may be approximate).     Assessment:   This is a routine wellness examination for [place].  Hearing/Vision screen Hearing Screening - Comments:: Pt has no difficulty hearing Vision Screening - Comments:: Annual vision screenings done by [facility]  Dietary issues and exercise activities discussed: Current Exercise Habits: The patient does not participate in regular exercise at present, Exercise limited by: None identified   Goals Addressed             This Visit's Progress    DIET - INCREASE WATER INTAKE   On track    Recommend to drink at least 6-8 8oz glasses of water per day        Depression Screen PHQ 2/9 Scores 08/23/2021 06/15/2021 03/15/2021 02/12/2021 10/13/2020 08/22/2020 06/13/2020  PHQ - 2 Score 1 0 0 0 0 0 1  PHQ- 9 Score 1 0 0 - - - 2    Fall Risk Fall Risk  08/23/2021 06/15/2021 03/15/2021 02/12/2021 10/13/2020  Falls in the past year? 0 0 0 0 0  Number falls in past yr: 0 0 0 0 0  Injury with Fall? 0 0 0 0 0  Risk for fall due to : No Fall Risks No Fall Risks - - -  Follow up Falls prevention discussed Falls prevention discussed - - -    FALL RISK PREVENTION PERTAINING TO THE HOME:  Any stairs in or around the home? Yes  If so, are there any without handrails? No  Home free of loose throw rugs  in walkways, pet beds, electrical cords, etc? Yes  Adequate lighting in your home to reduce risk of falls? Yes   ASSISTIVE DEVICES  UTILIZED TO PREVENT FALLS:  Life alert? No  Use of a cane, walker or w/c? No  Grab bars in the bathroom? Yes  Shower chair or bench in shower? No  Elevated toilet seat or a handicapped toilet? Yes   TIMED UP AND GO:  Was the test performed? Yes .  Length of time to ambulate 10 feet: 5 sec.   Gait steady and fast without use of assistive device  Cognitive Function: Normal cognitive status assessed by direct observation by this Nurse Health Advisor. No abnormalities found.       6CIT Screen 07/27/2019 07/21/2018 07/18/2017  What Year? 0 points 0 points 0 points  What month? 0 points 0 points 0 points  What time? 0 points 0 points 0 points  Count back from 20 0 points 0 points 0 points  Months in reverse 0 points 0 points 0 points  Repeat phrase 0 points 0 points 0 points  Total Score 0 0 0    Immunizations Immunization History  Administered Date(s) Administered   Fluad Quad(high Dose 65+) 03/24/2019   Influenza Split 04/11/2009   Influenza, High Dose Seasonal PF 03/21/2015, 05/10/2016, 03/30/2017, 03/30/2018   Influenza, Seasonal, Injecte, Preservative Fre 04/07/2012   Influenza,inj,Quad PF,6+ Mos 03/07/2014   Influenza-Unspecified 03/07/2014, 03/30/2017, 05/15/2020, 05/30/2021   PFIZER(Purple Top)SARS-COV-2 Vaccination 07/15/2019, 08/05/2019, 06/27/2020   Pneumococcal Conjugate-13 03/21/2015   Pneumococcal Polysaccharide-23 10/25/2009   Tdap 10/25/2009, 03/14/2018   Zoster, Live 09/24/2011    TDAP status: Up to date  Flu Vaccine status: Up to date  Pneumococcal vaccine status: Up to date  Covid-19 vaccine status: Completed vaccines  Qualifies for Shingles Vaccine? Yes   Zostavax completed Yes   Shingrix Completed?: No.    Education has been provided regarding the importance of this vaccine. Patient has been advised to call insurance company to determine out of pocket expense if they have not yet received this vaccine. Advised may also receive vaccine at local  pharmacy or Health Dept. Verbalized acceptance and understanding.  Screening Tests Health Maintenance  Topic Date Due   Zoster Vaccines- Shingrix (1 of 2) Never done   OPHTHALMOLOGY EXAM  04/02/2019   COVID-19 Vaccine (4 - Booster for [employer]izer series) 08/22/2020   FOOT EXAM  10/13/2021   HEMOGLOBIN A1C  12/14/2021   MAMMOGRAM  11/01/2022   COLONOSCOPY (Pts 45-72yrs Insurance coverage will need to be confirmed)  03/05/2023   TETANUS/TDAP  03/14/2028   Pneumonia Vaccine 27+ Years old  Completed   INFLUENZA VACCINE  Completed   DEXA SCAN  Completed   Hepatitis C Screening  Completed   HPV VACCINES  Aged Out    Health Maintenance  Health Maintenance Due  Topic Date Due   Zoster Vaccines- Shingrix (1 of 2) Never done   OPHTHALMOLOGY EXAM  04/02/2019   COVID-19 Vaccine (4 - Booster for [place] series) 08/22/2020    Colorectal cancer screening: Type of screening: Colonoscopy. Completed 03/04/18. Repeat every 5 years  Mammogram status: Completed 10/31/20. Repeat every year  Bone Density status: Completed 10/31/20. Results reflect: Bone density results: NORMAL. Repeat every 2 years.  Lung Cancer Screening: (Low Dose CT Chest recommended if Age 36-80 years, 30 pack-year currently smoking OR have quit w/in 15years.) does not qualify.   Additional Screening:  Hepatitis C Screening: does qualify; Completed  Vision Screening: Recommended annual ophthalmology exams for  early detection of glaucoma and other disorders of the eye. Is the patient up to date with their annual eye exam?  No  Who is the provider or what is the name of the office in which the patient attends annual eye exams? [facility].   Dental Screening: Recommended annual dental exams for proper oral hygiene  Community Resource Referral / Chronic Care Management: CRR required this visit?  No   CCM required this visit?  No      Plan:     I have personally reviewed and noted the following in the patients  chart:   Medical and social history Use of alcohol, tobacco or illicit drugs  Current medications and supplements including opioid prescriptions.  Functional ability and status Nutritional status Physical activity Advanced directives List of other physicians Hospitalizations, surgeries, and ER visits in previous 12 months Vitals Screenings to include cognitive, depression, and falls Referrals and appointments  In addition, I have reviewed and discussed with patient certain preventive protocols, quality metrics, and best practice recommendations. A written personalized care plan for preventive services as well as general preventive health recommendations were provided to patient.     Clemetine Marker, LPN   [id number]   Nurse Notes: pt c/o hemorrhoids that bleed with bowel movement; she is hesitant to do the banding procedure only having a local anesthetic. Pt advised to discuss with gastroenterology.   Pt also states her daughter was diagnosed with lung cancer last year; non smoker but was exposed to second hand smoke for a long period of time. Pt wants to know if there is any type of screening or imaging she should complete; pt also exposed to second hand smoke from her husband for many years. Please advise.

## 2021-09-05 ENCOUNTER — Telehealth: Payer: Self-pay

## 2021-09-05 NOTE — Progress Notes (Signed)
? ? ?Chronic Care Management ?Pharmacy Assistant  ? ?Name: Christine Roberts  MRN: [id number] DOB: May 09, 1946 ? ?Reason for [place] Call. ?  ?Recent office visits:  ?08/23/2021 Clemetine Marker LPN (PCP Office) Medicare Wellness Completed, No Medication Changes noted ? ?Recent consult visits:  ?No recent consult visit ? ?Hospital visits:  ?None in previous 6 months ? ?Medications: ?Outpatient Encounter Medications as of 09/05/2021  ?Medication Sig  ? ACCU-CHEK SOFTCLIX LANCETS lancets 1 each by Other route daily.  ? acetaminophen (TYLENOL) 650 MG CR tablet Take 650 mg by mouth every 8 (eight) hours as needed.  ? Alcohol Swabs (B-D SINGLE USE SWABS REGULAR) PADS 1 each by Does not apply route daily.  ? aspirin EC 81 MG tablet Take 1 tablet (81 mg total) by mouth daily.  ? atorvastatin (LIPITOR) 40 MG tablet TAKE 1 TABLET 2 TIMES A WEEK.  ? calcium citrate (CALCITRATE - DOSED IN MG ELEMENTAL CALCIUM) 950 MG tablet Take 200 mg of elemental calcium by mouth daily. Has D3 in it as well  ? Cholecalciferol (VITAMIN D) 125 MCG (5000 UT) CAPS Take by mouth. Pt taking 3 times per week  ? Coenzyme Q10 (COQ10) 100 MG CAPS Take 1 capsule by mouth daily.  ? fluticasone (FLONASE) 50 MCG/ACT nasal spray Place 2 sprays into both nostrils daily.  ? glucose blood (ACCU-CHEK AVIVA PLUS) test strip TEST BLOOD SUGAR UP TO FOUR TIMES DAILY AS DIRECTED  ? insulin degludec (TRESIBA FLEXTOUCH) 200 UNIT/ML FlexTouch Pen Inject 28 Units into the skin daily. Patient receives through [employer] Patient Assistance through Dec 2022  ? Insulin Pen Needle (NOVOFINE) 30G X 8 MM MISC Inject 10 each into the skin as needed.  ? lisinopril (ZESTRIL) 10 MG tablet Take 1 tablet (10 mg total) by mouth daily.  ? metFORMIN (GLUCOPHAGE-XR) 750 MG 24 hr tablet TAKE 1 TABLET EVERY DAY WITH BREAKFAST  ? polyethylene glycol powder (GLYCOLAX/MIRALAX) powder Take 17 g by mouth 2 (two) times daily as needed.  ? Semaglutide, 1 MG/DOSE, 4 MG/3ML SOPN  Inject 1 mg as directed once a week. Patient receives through [employer] Patient Assistance through Dec 2022  ? VITAMIN B COMPLEX-C PO Take by mouth.  ? ?No facility-administered encounter medications on file as of 09/05/2021.  ? ? ?Care Gaps: ?Shingrix vaccine ?COVID 19 Vaccine (4- Booster for [employer]) ?Ophthalmology Exam (Last Completed 04/01/2018) ? ?Star Rating Drugs: ?Atorvastatin 40 mg last filled 08/24/2021 for 84 day supply at [facility] ?Lisinopril 5 mg last filled 08/31/2021 for 90 day at [facility] ?Metformin 750 mg last filled 06/19/2021 for 90 days supply at [facility] ?Ozempic 0.5 mg  (Approved from [employer]) ?Tresiba 30 units daily (Approved from [employer]) ? ?Medication Fill Gaps: ?None ID ? ?Reviewed chart prior to disease state call. Spoke with patient regarding BP ? ?Recent Office Vitals: ?BP Readings from Last 3 Encounters:  ?08/23/21 120/80  ?06/21/21 126/77  ?06/15/21 140/74  ? ?Pulse Readings from Last 3 Encounters:  ?08/23/21 98  ?06/21/21 85  ?06/15/21 97  ?  ?Wt Readings from Last 3 Encounters:  ?08/23/21 192 lb 12.8 oz (87.5 kg)  ?06/21/21 197 lb (89.4 kg)  ?06/15/21 197 lb (89.4 kg)  ?  ? ?Kidney Function ?Lab Results  ?Component Value Date/Time  ? CREATININE 0.77 06/21/2021 12:08 PM  ? CREATININE 0.78 02/12/2021 02:34 PM  ? CREATININE 0.94 (H) 02/09/2020 11:17 AM  ? GFRNONAA 59 (L) 02/09/2020 11:17 AM  ? GFRAA 69 02/09/2020  11:17 AM  ? ? ?BMP Latest Ref Rng & Units 06/21/2021 02/12/2021 02/09/2020  ?Glucose 70 - 99 mg/dL 129(H) 62(L) 74  ?BUN 8 - 27 mg/dL 11 13 16   ?Creatinine 0.57 - 1.00 mg/dL 0.77 0.78 0.94(H)  ?BUN/Creat Ratio 12 - 28 14 NOT APPLICABLE 17  ?Sodium 134 - 144 mmol/L 140 142 140  ?Potassium 3.5 - 5.2 mmol/L 4.4 3.9 4.3  ?Chloride 96 - 106 mmol/L 100 106 104  ?CO2 20 - 29 mmol/L 24 27 26   ?Calcium 8.7 - 10.3 mg/dL 9.4 9.2 9.9  ? ? ?Current antihypertensive regimen:  ?Lisinopril 10 mg daily  ? ?Patient denies any headaches, lightheadedness.or  dizziness. ? ?How often are you checking your Blood Pressure? daily ?Current home BP readings:  ?Patient reports he blood pressure has been doing "good". ?120/80,124/80,130/78 ? ?What recent interventions/DTPs have been made by any provider to improve Blood Pressure control since last CPP Visit: None ID ? ?Any recent hospitalizations or ED visits since last visit with CPP? No ? ?What diet changes have been made to improve Blood Pressure Control?  ?Patient denies any changes in her diet. ? ?What exercise is being done to improve your Blood Pressure Control?  ?Patient reports she does yard work, and walks depending on the weather. ? ?Adherence Review: ?Is the patient currently on ACE/ARB medication? Yes ?Does the patient have >5 day gap between last estimated fill dates? No ? ? Telephone follow up appointment with care management team member scheduled for:  11/07/2021 at 10:15 AM ? ?Bessie Kellihan,CPA ?Clinical Pharmacist Assistant ?[phone]  ? ? ?

## 2021-09-17 ENCOUNTER — Telehealth: Payer: Self-pay

## 2021-09-17 NOTE — Progress Notes (Signed)
Per Clinical pharmacist, reschedule patient appointment in May to July. ? ?Telephone follow up appointment with Care management team member was rescheduled for : 01/23/2022 at 3:45 pm. ? ?Christine Roberts ?Clinical Pharmacist Assistant ?[phone]  ? ? ?

## 2021-09-19 ENCOUNTER — Other Ambulatory Visit: Payer: Self-pay | Admitting: Family Medicine

## 2021-09-19 DIAGNOSIS — E1121 Type 2 diabetes mellitus with diabetic nephropathy: Secondary | ICD-10-CM

## 2021-10-19 NOTE — Progress Notes (Signed)
Name: Christine Roberts   MRN: [id number]    DOB: [birth year]   Date:10/22/2021 ? ?     Progress Note ? ?Subjective ? ?Chief Complaint ? ?Follow Up ? ?HPI ? ?DMII: she is on Metformin 750 mg daily , Tresiba 26  units , Ozempic 1 ( through assistance program) . She is compliant with statin therapy. A1C today was down to 6.8 % . She has dyslipidemia and obesity, neuropathy, nephrolphaty   also HTN . Taking statins and ACE. She has noticed decrease in appetite late at night. Remind her to only go down on tresiba after an average of 3 days with fasting glucose below 100  ? ?HTN : compliant with medication,  no chest pain or palpitation. Doing well on lisinopril 10 mg daily  ? ?Obesity: her weight is stable now, BMI is below 35. She still eats smaller portions, Ozempic suppress her appetite  ?  ?Hyperlipidemia: taking lipitor one two or three times a week to avoid side effects, otherwise she has leg pains. Last LDL was 68  ?  ?TIA versus Complicated migraine: she developed dizziness, headache and aphasia  on 05/24/2019 - she was transported by EMS to [facility], she had normal Echo, CT, MRI of brain and carotid dopplers.  She was seen by neurologist and advised to continue on aspirin and lipitor in case it was a TIA, but likely a complicated migraine. Still doing well  ? ?B12 deficiency: taking it otc three times a week , last level was at goal . Continue supplementation  ?  ?Nephrolithiasis: seen by Urologist and no intervention required. No recent episodes.  ? ?Senile purpura:  skin very thin, has it on both arms today, reassurance given  ?  ?Atherosclerosis of aorta: continue statin therapy and aspirin 81 mg daily , she is now taking Atorvastatin a few times a week.and LDL has been at goal , she avoids taking it daily due to foot cramps.  ?  ?Grade III hemorrhoids; seen by Dr. Alice Reichert - GI and was referred to general surgeon for possible banding, but she wants to wait until her next colonoscopy in 2024 , she wants to be under  anesthesia . Unchanged ? ?Redness on right lower leg: she was doing her yard over the weekend and developed a sore spot behind right lower leg, initially was itchy but is now red and tender ? ?Patient Active Problem List  ? Diagnosis Date Noted  ? Atherosclerosis of aorta ([place]) 05/25/2018  ? Seasonal allergic rhinitis 09/12/2015  ? TIA (transient ischemic attack) 05/24/2015  ? Senile purpura ([place]) 05/09/2015  ? Benign essential HTN 03/18/2015  ? Pain in shoulder 03/18/2015  ? Dyslipidemia 03/18/2015  ? History of surgery to major organs, presenting hazards to health 03/18/2015  ? Hearing loss 03/18/2015  ? Hiatal hernia 03/18/2015  ? History of colon polyps 03/18/2015  ? Calculus of kidney 03/18/2015  ? B12 deficiency 03/18/2015  ? Microalbuminuria 03/18/2015  ? Adult BMI 30+ 03/18/2015  ? Arthritis, degenerative 03/18/2015  ? Diabetes mellitus with nephropathy ([place]) 03/18/2015  ? ? ?Past Surgical History:  ?Procedure Laterality Date  ? ABDOMINAL HYSTERECTOMY    ? CHOLECYSTECTOMY    ? COLONOSCOPY  2015  ? COLONOSCOPY WITH PROPOFOL N/A 03/04/2018  ? Procedure: COLONOSCOPY WITH PROPOFOL;  Surgeon: Toledo, Benay Pike, MD;  Location: [facility] ENDOSCOPY;  Service: Gastroenterology;  Laterality: N/A;  ? cystoscopic    ? stent placed in ureter  ? LITHOTRIPSY  12/2007  ? MYRINGOTOMY Right 11/2009  ? ? ?  Family History  ?Problem Relation Age of Onset  ? Cancer Mother   ? Diabetes Mother   ? Diabetes Daughter   ? CAD Father   ? Diabetes Daughter   ?     hypoglycemia  ? ? ?Social History  ? ?Tobacco Use  ? Smoking status: Never  ? Smokeless tobacco: Never  ? Tobacco comments:  ?  smoking cessation materials not required  ?Substance Use Topics  ? Alcohol use: Yes  ?  Alcohol/week: 1.0 standard drink  ?  Types: 1 Shots of liquor per week  ?  Comment: socially  ? ? ? ?Current Outpatient Medications:  ?  ACCU-CHEK SOFTCLIX LANCETS lancets, 1 each by Other route daily., Disp: 100 each, Rfl: 1 ?  acetaminophen (TYLENOL) 650 MG CR tablet,  Take 650 mg by mouth every 8 (eight) hours as needed., Disp: , Rfl:  ?  Alcohol Swabs (B-D SINGLE USE SWABS REGULAR) PADS, 1 each by Does not apply route daily., Disp: 400 each, Rfl: 1 ?  aspirin EC 81 MG tablet, Take 1 tablet (81 mg total) by mouth daily., Disp: 90 tablet, Rfl: 0 ?  atorvastatin (LIPITOR) 40 MG tablet, TAKE 1 TABLET 2 TIMES A WEEK., Disp: 24 tablet, Rfl: 1 ?  calcium citrate (CALCITRATE - DOSED IN MG ELEMENTAL CALCIUM) 950 MG tablet, Take 200 mg of elemental calcium by mouth daily. Has D3 in it as well, Disp: , Rfl:  ?  Cholecalciferol (VITAMIN D) 125 MCG (5000 UT) CAPS, Take by mouth. Pt taking 3 times per week, Disp: , Rfl:  ?  Coenzyme Q10 (COQ10) 100 MG CAPS, Take 1 capsule by mouth daily., Disp: , Rfl:  ?  doxycycline (VIBRA-TABS) 100 MG tablet, Take 1 tablet (100 mg total) by mouth 2 (two) times daily., Disp: 10 tablet, Rfl: 0 ?  fluticasone (FLONASE) 50 MCG/ACT nasal spray, Place 2 sprays into both nostrils daily., Disp: 16 g, Rfl: 6 ?  glucose blood (ACCU-CHEK AVIVA PLUS) test strip, TEST BLOOD SUGAR UP TO FOUR TIMES DAILY AS DIRECTED, Disp: 400 each, Rfl: 0 ?  insulin degludec (TRESIBA FLEXTOUCH) 200 UNIT/ML FlexTouch Pen, Inject 28 Units into the skin daily. Patient receives through [employer] Patient Assistance through Dec 2022, Disp: , Rfl:  ?  Insulin Pen Needle (NOVOFINE) 30G X 8 MM MISC, Inject 10 each into the skin as needed., Disp: 100 each, Rfl: 2 ?  lisinopril (ZESTRIL) 10 MG tablet, Take 1 tablet (10 mg total) by mouth daily., Disp: 90 tablet, Rfl: 1 ?  metFORMIN (GLUCOPHAGE-XR) 750 MG 24 hr tablet, TAKE 1 TABLET EVERY DAY WITH BREAKFAST, Disp: 90 tablet, Rfl: 1 ?  polyethylene glycol powder (GLYCOLAX/MIRALAX) powder, Take 17 g by mouth 2 (two) times daily as needed., Disp: 3350 g, Rfl: 1 ?  Semaglutide, 1 MG/DOSE, 4 MG/3ML SOPN, Inject 1 mg as directed once a week. Patient receives through [employer] Patient Assistance through Dec 2022, Disp: , Rfl:  ?  VITAMIN B COMPLEX-C  PO, Take by mouth., Disp: , Rfl:  ? ?Allergies  ?Allergen Reactions  ? Latex Rash  ? Erythromycin Other (See Comments)  ?  Strawberry tongue  ? ? ?I personally reviewed active problem list, medication list, allergies, family history, social history with the patient/caregiver today. ? ? ?ROS ? ?Constitutional: Negative for fever or weight change.  ?Respiratory: Negative for cough and shortness of breath.   ?Cardiovascular: Negative for chest pain or palpitations.  ?Gastrointestinal: Negative for abdominal pain, no bowel changes.  ?Musculoskeletal:  Negative for gait problem or joint swelling.  ?Skin: Negative for rash.  ?Neurological: Negative for dizziness or headache.  ?No other specific complaints in a complete review of systems (except as listed in HPI above).  ? ?Objective ? ?Vitals:  ? 10/22/21 1014  ?BP: 122/70  ?Pulse: 91  ?Resp: 16  ?SpO2: 97%  ?Weight: 191 lb (86.6 kg)  ?Height: '5\' 4"'$  (1.626 m)  ? ? ?Body mass index is 32.79 kg/m?. ? ?Physical Exam ? ?Constitutional: Patient appears well-developed and well-nourished. Obese  No distress.  ?HEENT: head atraumatic, normocephalic, pupils equal and reactive to light, neck supple ?Cardiovascular: Normal rate, regular rhythm and normal heart sounds.  No murmur heard. No BLE edema. ?Skin: erythematous area with a raised center on posterior right lower leg, near ankle  , no oozing  ?Pulmonary/Chest: Effort normal and breath sounds normal. No respiratory distress. ?Abdominal: Soft.  There is no tenderness. ?Psychiatric: Patient has a normal mood and affect. behavior is normal. Judgment and thought content normal.  ? ?Diabetic Foot Exam: ?Diabetic Foot Exam - Simple   ?Simple Foot Form ?Visual Inspection ?No deformities, no ulcerations, no other skin breakdown bilaterally: Yes ?Sensation Testing ?Intact to touch and monofilament testing bilaterally: Yes ?Pulse Check ?Posterior Tibialis and Dorsalis pulse intact bilaterally: Yes ?Comments ?  ? ? ? ?PHQ2/9: ? ?   10/22/2021  ? 10:14 AM 08/23/2021  ?  2:52 PM 06/15/2021  ? 11:09 AM 03/15/2021  ? 11:43 AM 02/12/2021  ?  1:50 PM  ?Depression screen PHQ 2/9  ?Decreased Interest 0 0 0 0 0  ?Down, Depressed, Hopeless 0 1 0 0 0  ?PHQ - 2 Sco

## 2021-10-22 ENCOUNTER — Encounter: Payer: Self-pay | Admitting: Family Medicine

## 2021-10-22 ENCOUNTER — Ambulatory Visit (INDEPENDENT_AMBULATORY_CARE_PROVIDER_SITE_OTHER): Payer: Medicare HMO | Admitting: Family Medicine

## 2021-10-22 VITALS — BP 122/70 | HR 91 | Resp 16 | Ht 64.0 in | Wt 191.0 lb

## 2021-10-22 DIAGNOSIS — E538 Deficiency of other specified B group vitamins: Secondary | ICD-10-CM | POA: Diagnosis not present

## 2021-10-22 DIAGNOSIS — E785 Hyperlipidemia, unspecified: Secondary | ICD-10-CM | POA: Diagnosis not present

## 2021-10-22 DIAGNOSIS — I700 Atherosclerosis of aorta: Secondary | ICD-10-CM

## 2021-10-22 DIAGNOSIS — D692 Other nonthrombocytopenic purpura: Secondary | ICD-10-CM

## 2021-10-22 DIAGNOSIS — M170 Bilateral primary osteoarthritis of knee: Secondary | ICD-10-CM | POA: Diagnosis not present

## 2021-10-22 DIAGNOSIS — E1121 Type 2 diabetes mellitus with diabetic nephropathy: Secondary | ICD-10-CM

## 2021-10-22 DIAGNOSIS — E1159 Type 2 diabetes mellitus with other circulatory complications: Secondary | ICD-10-CM | POA: Diagnosis not present

## 2021-10-22 DIAGNOSIS — Z8673 Personal history of transient ischemic attack (TIA), and cerebral infarction without residual deficits: Secondary | ICD-10-CM | POA: Diagnosis not present

## 2021-10-22 DIAGNOSIS — I152 Hypertension secondary to endocrine disorders: Secondary | ICD-10-CM

## 2021-10-22 DIAGNOSIS — L02415 Cutaneous abscess of right lower limb: Secondary | ICD-10-CM

## 2021-10-22 DIAGNOSIS — I10 Essential (primary) hypertension: Secondary | ICD-10-CM

## 2021-10-22 MED ORDER — DOXYCYCLINE HYCLATE 100 MG PO TABS: 100.0000 mg | ORAL_TABLET | Freq: Two times a day (BID) | ORAL | 0 refills | Status: DC

## 2021-10-26 LAB — HEMOGLOBIN A1C
Hgb A1c MFr Bld: 7 % of total Hgb — ABNORMAL HIGH (ref <5.7)
Mean Plasma Glucose: 154 mg/dL
eAG (mmol/L): 8.5 mmol/L

## 2021-10-26 LAB — AEROBIC CULTURE
AER RESULT:: NO GROWTH
MICRO NUMBER:: 13278045
SPECIMEN QUALITY:: ADEQUATE

## 2021-10-29 ENCOUNTER — Ambulatory Visit: Payer: Self-pay

## 2021-10-29 ENCOUNTER — Other Ambulatory Visit: Payer: Self-pay | Admitting: Family Medicine

## 2021-10-29 DIAGNOSIS — L989 Disorder of the skin and subcutaneous tissue, unspecified: Secondary | ICD-10-CM

## 2021-10-29 NOTE — Telephone Encounter (Signed)
Pt called and stated that she finished antibiotic and red spot is still on leg. She stated that it is sore and swollen. Patient was seen in office 4/17 for issue  ? ?Left message to call back about symptoms. ?

## 2021-10-29 NOTE — Telephone Encounter (Signed)
Spoke with patient and relayed information per Dr. Ancil Boozer. She expressed understanding and had no questions or concerns at this time nit will call our office with any changes.  ?

## 2021-10-29 NOTE — Telephone Encounter (Signed)
?  Chief Complaint: Sore on Leg ?Symptoms: Completed antibiotics and sore looks the same, "No worse but no better." ?Seen in [facility] 10/22/21, placed on Doxy ?Frequency:  ?Pertinent Negatives: Patient denies Fever ?Disposition: '[]'$ ED /'[]'$ Urgent Care (no appt availability in office) / '[]'$ Appointment(In office/virtual)/ '[]'$  [facility]/ '[]'$ Home Care/ '[]'$ Refused Recommended Disposition /'[]'$ [facility]/ '[x]'$  Follow-up with PCP ?Additional Notes: Pt completed course of antibiotics yesterday. Question if she needs another round. 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 Please advise. ? ? Reason for Disposition ? Large sore (> 1 inch or 2.5 cm across) ?   Completed antibiotic and wound "Looks the same" ? ?Answer Assessment - Initial Assessment Questions ?1. APPEARANCE of SORES: "What do the sores look like?" ?    Same as seen in [place] 4/17 ?2. NUMBER: "How many sores are there?" ?    One ?3. SIZE: "How big is the largest sore?" ?    SAme ?4. LOCATION: "Where are the sores located?" ?    Right leg ?5. ONSET: "When did the sores begin?" ?     ?6. CAUSE: "What do you think is causing the sores?" ?     ?7. OTHER SYMPTOMS: "Do you have any other symptoms?" (e.g., fever, new weakness) ?    no ? ?Protocols used: Sores-A-AH ? ?

## 2021-11-02 ENCOUNTER — Other Ambulatory Visit: Payer: Self-pay | Admitting: Family Medicine

## 2021-11-02 DIAGNOSIS — E785 Hyperlipidemia, unspecified: Secondary | ICD-10-CM

## 2021-11-07 ENCOUNTER — Telehealth: Payer: Medicare HMO

## 2021-11-12 ENCOUNTER — Ambulatory Visit: Payer: Medicare HMO | Admitting: Dermatology

## 2021-11-12 DIAGNOSIS — L578 Other skin changes due to chronic exposure to nonionizing radiation: Secondary | ICD-10-CM

## 2021-11-12 DIAGNOSIS — L820 Inflamed seborrheic keratosis: Secondary | ICD-10-CM | POA: Diagnosis not present

## 2021-11-12 DIAGNOSIS — C44722 Squamous cell carcinoma of skin of right lower limb, including hip: Secondary | ICD-10-CM | POA: Diagnosis not present

## 2021-11-12 DIAGNOSIS — C4492 Squamous cell carcinoma of skin, unspecified: Secondary | ICD-10-CM

## 2021-11-12 DIAGNOSIS — D489 Neoplasm of uncertain behavior, unspecified: Secondary | ICD-10-CM

## 2021-11-12 HISTORY — DX: Squamous cell carcinoma of skin, unspecified: C44.92

## 2021-11-12 NOTE — Progress Notes (Signed)
New Patient Visit  Subjective  Christine Roberts is a 76 y.o. female who presents for the following: New Patient (Initial Visit) (Patient reports a spot at right lower leg that will not go away. Patient reports present for 1 month and hx of culture negative results. /Patient reports family hs of skin cancer in brothers. /). The patient has spots, moles and lesions to be evaluated, some may be new or changing and the patient has concerns that these could be cancer.  The following portions of the chart were reviewed this encounter and updated as appropriate:   Tobacco  Allergies  Meds  Problems  Med Hx  Surg Hx  Fam Hx     Review of Systems:  No other skin or systemic complaints except as noted in HPI or Assessment and Plan.  Objective  Well appearing patient in no apparent distress; mood and affect are within normal limits.  A focused examination was performed including right lower leg and back. Relevant physical exam findings are noted in the Assessment and Plan.  right distal medial calf 1.3 cm hyperkeratotic crusted papule        mid back spinal x 1 Erythematous stuck-on, waxy papule or plaque   Assessment & Plan  Neoplasm of uncertain behavior right distal medial calf Epidermal / dermal shaving  Lesion diameter (cm):  1.3 Informed consent: discussed and consent obtained   Timeout: patient name, date of birth, surgical site, and procedure verified   Procedure prep:  Patient was prepped and draped in usual sterile fashion Prep type:  Isopropyl alcohol Anesthesia: the lesion was anesthetized in a standard fashion   Anesthetic:  1% lidocaine w/ epinephrine 1-100,000 buffered w/ 8.4% NaHCO3 Instrument used: flexible razor blade   Hemostasis achieved with: pressure, aluminum chloride and electrodesiccation   Outcome: patient tolerated procedure well   Post-procedure details: sterile dressing applied and wound care instructions given   Dressing type: bandage and  petrolatum    Destruction of lesion Complexity: extensive   Destruction method: electrodesiccation and curettage   Informed consent: discussed and consent obtained   Timeout:  patient name, date of birth, surgical site, and procedure verified Procedure prep:  Patient was prepped and draped in usual sterile fashion Prep type:  Isopropyl alcohol Anesthesia: the lesion was anesthetized in a standard fashion   Anesthetic:  1% lidocaine w/ epinephrine 1-100,000 buffered w/ 8.4% NaHCO3 Curettage performed in three different directions: Yes   Electrodesiccation performed over the curetted area: Yes   Lesion length (cm):  1.3 Lesion width (cm):  1.3 Margin per side (cm):  0.2 Final wound size (cm):  1.7 Hemostasis achieved with:  pressure, aluminum chloride and electrodesiccation Outcome: patient tolerated procedure well with no complications   Post-procedure details: sterile dressing applied and wound care instructions given   Dressing type: bandage and petrolatum    Specimen 1 - Surgical pathology Differential Diagnosis: r/o scc Check Margins: No R/o scc  Inflamed seborrheic keratosis mid back spinal x 1 Destruction of lesion - mid back spinal x 1 Complexity: simple   Destruction method: cryotherapy   Informed consent: discussed and consent obtained   Timeout:  patient name, date of birth, surgical site, and procedure verified Lesion destroyed using liquid nitrogen: Yes   Region frozen until ice ball extended beyond lesion: Yes   Outcome: patient tolerated procedure well with no complications   Post-procedure details: wound care instructions given   Additional details:  Prior to procedure, discussed risks of blister formation, small  wound, skin dyspigmentation, or rare scar following cryotherapy. Recommend Vaseline ointment to treated areas while healing.  Actinic Damage - chronic, secondary to cumulative UV radiation exposure/sun exposure over time - diffuse scaly erythematous  macules with underlying dyspigmentation - Recommend daily broad spectrum sunscreen SPF 30+ to sun-exposed areas, reapply every 2 hours as needed.  - Recommend staying in the shade or wearing long sleeves, sun glasses (UVA+UVB protection) and wide brim hats (4-inch brim around the entire circumference of the hat). - Call for new or changing lesions.  Return in about 4 months (around 03/15/2022) for TBSE.  IRuthell Rummage, CMA, am acting as scribe for Sarina Ser, MD. Documentation: I have reviewed the above documentation for accuracy and completeness, and I agree with the above.  Sarina Ser, MD

## 2021-11-12 NOTE — Patient Instructions (Addendum)
Biopsy Wound Care Instructions ? ?Leave the original bandage on for 24 hours if possible.  If the bandage becomes soaked or soiled before that time, it is OK to remove it and examine the wound.  A small amount of post-operative bleeding is normal.  If excessive bleeding occurs, remove the bandage, place gauze over the site and apply continuous pressure (no peeking) over the area for 30 minutes. If this does not work, please call our clinic as soon as possible or page your doctor if it is after hours.  ? ?Once a day, cleanse the wound with soap and water. It is fine to shower. If a thick crust develops you may use a Q-tip dipped into dilute hydrogen peroxide (mix 1:1 with water) to dissolve it.  Hydrogen peroxide can slow the healing process, so use it only as needed.   ? ?After washing, apply petroleum jelly (Vaseline) or an antibiotic ointment if your doctor prescribed one for you, followed by a bandage.   ? ?For best healing, the wound should be covered with a layer of ointment at all times. If you are not able to keep the area covered with a bandage to hold the ointment in place, this may mean re-applying the ointment several times a day.  Continue this wound care until the wound has healed and is no longer open.  ? ?Itching and mild discomfort is normal during the healing process. However, if you develop pain or severe itching, please call our office.  ? ?If you have any discomfort, you can take Tylenol (acetaminophen) or ibuprofen as directed on the bottle. (Please do not take these if you have an allergy to them or cannot take them for another reason). ? ?Some redness, tenderness and white or yellow material in the wound is normal healing.  If the area becomes very sore and red, or develops a thick yellow-green material (pus), it may be infected; please notify us.   ? ?If you have stitches, return to clinic as directed to have the stitches removed. You will continue wound care for 2-3 days after the stitches  are removed.  ? ?Wound healing continues for up to one year following surgery. It is not unusual to experience pain in the scar from time to time during the interval.  If the pain becomes severe or the scar thickens, you should notify the office.   ? ?A slight amount of redness in a scar is expected for the first six months.  After six months, the redness will fade and the scar will soften and fade.  The color difference becomes less noticeable with time.  If there are any problems, return for a post-op surgery check at your earliest convenience. ? ?To improve the appearance of the scar, you can use silicone scar gel, cream, or sheets (such as Mederma or Serica) every night for up to one year. These are available over the counter (without a prescription). ? ?Please call our office at [phone] for any questions or concerns. ? ? ? ?Electrodesiccation and Curettage (?Scrape and Burn?) Wound Care Instructions ? ?Leave the original bandage on for 24 hours if possible.  If the bandage becomes soaked or soiled before that time, it is OK to remove it and examine the wound.  A small amount of post-operative bleeding is normal.  If excessive bleeding occurs, remove the bandage, place gauze over the site and apply continuous pressure (no peeking) over the area for 30 minutes. If this does not work, please call  our clinic as soon as possible or page your doctor if it is after hours.  ? ?Once a day, cleanse the wound with soap and water. It is fine to shower. If a thick crust develops you may use a Q-tip dipped into dilute hydrogen peroxide (mix 1:1 with water) to dissolve it.  Hydrogen peroxide can slow the healing process, so use it only as needed.   ? ?After washing, apply petroleum jelly (Vaseline) or an antibiotic ointment if your doctor prescribed one for you, followed by a bandage.   ? ?For best healing, the wound should be covered with a layer of ointment at all times. If you are not able to keep the area covered  with a bandage to hold the ointment in place, this may mean re-applying the ointment several times a day.  Continue this wound care until the wound has healed and is no longer open. It may take several weeks for the wound to heal and close. ? ?Itching and mild discomfort is normal during the healing process. ? ?If you have any discomfort, you can take Tylenol (acetaminophen) or ibuprofen as directed on the bottle. (Please do not take these if you have an allergy to them or cannot take them for another reason). ? ?Some redness, tenderness and white or yellow material in the wound is normal healing.  If the area becomes very sore and red, or develops a thick yellow-green material (pus), it may be infected; please notify us.   ? ?Wound healing continues for up to one year following surgery. It is not unusual to experience pain in the scar from time to time during the interval.  If the pain becomes severe or the scar thickens, you should notify the office.   ? ?A slight amount of redness in a scar is expected for the first six months.  After six months, the redness will fade and the scar will soften and fade.  The color difference becomes less noticeable with time.  If there are any problems, return for a post-op surgery check at your earliest convenience. ? ?To improve the appearance of the scar, you can use silicone scar gel, cream, or sheets (such as Mederma or Serica) every night for up to one year. These are available over the counter (without a prescription). ? ?Please call our office at [phone] for any questions or concerns. ? ?Seborrheic Keratosis ? ?What causes seborrheic keratoses? ?Seborrheic keratoses are harmless, common skin growths that first appear during adult life.  As time goes by, more growths appear.  Some people may develop a large number of them.  Seborrheic keratoses appear on both covered and uncovered body parts.  They are not caused by sunlight.  The tendency to develop seborrheic  keratoses can be inherited.  They vary in color from skin-colored to gray, Hitchner, or even black.  They can be either smooth or have a rough, warty surface.   ?Seborrheic keratoses are superficial and look as if they were stuck on the skin.  Under the microscope this type of keratosis looks like layers upon layers of skin.  That is why at times the top layer may seem to fall off, but the rest of the growth remains and re-grows.   ? ?Treatment ?Seborrheic keratoses do not need to be treated, but can easily be removed in the office.  Seborrheic keratoses often cause symptoms when they rub on clothing or jewelry.  Lesions can be in the way of shaving.  If they become  inflamed, they can cause itching, soreness, or burning.  Removal of a seborrheic keratosis can be accomplished by freezing, burning, or surgery. ?If any spot bleeds, scabs, or grows rapidly, please return to have it checked, as these can be an indication of a skin cancer. ? ?Cryotherapy Aftercare ? ?Wash gently with soap and water everyday.   ?Apply Vaseline and Band-Aid daily until healed.  ? ?Melanoma ABCDEs ? ?Melanoma is the most dangerous type of skin cancer, and is the leading cause of death from skin disease.  You are more likely to develop melanoma if you: ?Have light-colored skin, light-colored eyes, or red or blond hair ?Spend a lot of time in the sun ?Tan regularly, either outdoors or in a tanning bed ?Have had blistering sunburns, especially during childhood ?Have a close family member who has had a melanoma ?Have atypical moles or large birthmarks ? ?Early detection of melanoma is key since treatment is typically straightforward and cure rates are extremely high if we catch it early.  ? ?The first sign of melanoma is often a change in a mole or a new dark spot.  The ABCDE system is a way of remembering the signs of melanoma. ? ?A for asymmetry:  The two halves do not match. ?B for border:  The edges of the growth are irregular. ?C for color:  A  mixture of colors are present instead of an even Miron color. ?D for diameter:  Melanomas are usually (but not always) greater than 43m - the size of a pencil eraser. ?E for evolution:  The spot keeps changin

## 2021-11-13 ENCOUNTER — Telehealth: Payer: Self-pay

## 2021-11-13 NOTE — Telephone Encounter (Signed)
-----   Message from Ralene Bathe, MD sent at 11/13/2021  3:59 PM EDT ----- ?Diagnosis ?Skin , right distal medial calf ?WELL DIFFERENTIATED SQUAMOUS CELL CARCINOMA, BASE INVOLVED ? ?Cancer - SCC ?Already treated ?Recheck next visit ?

## 2021-11-13 NOTE — Telephone Encounter (Signed)
Discussed biopsy results with pt  °

## 2021-11-14 ENCOUNTER — Other Ambulatory Visit: Payer: Self-pay | Admitting: Family Medicine

## 2021-11-14 DIAGNOSIS — E1121 Type 2 diabetes mellitus with diabetic nephropathy: Secondary | ICD-10-CM

## 2021-11-14 DIAGNOSIS — I10 Essential (primary) hypertension: Secondary | ICD-10-CM

## 2021-11-16 ENCOUNTER — Telehealth: Payer: Self-pay

## 2021-11-16 NOTE — Telephone Encounter (Signed)
Spoke with patient and informed her that the ozempic medication has arrived to our office and that she can come pick up.  Patient stated that she would stop by after lunch on today, 11/16/2021.

## 2021-11-27 ENCOUNTER — Encounter: Payer: Self-pay | Admitting: Dermatology

## 2022-01-22 ENCOUNTER — Telehealth: Payer: Self-pay

## 2022-01-22 NOTE — Progress Notes (Signed)
Chronic Care Management APPOINTMENT REMINDER   Christine Roberts was reminded to have all medications, supplements and any blood glucose and blood pressure readings available for review with [name], Pharm. D, at her telephone visit on 01/23/2022 at 3:45 pm.   Patient confirm appointment.  [place] Pharmacist Assistant [phone]

## 2022-01-23 ENCOUNTER — Ambulatory Visit (INDEPENDENT_AMBULATORY_CARE_PROVIDER_SITE_OTHER): Payer: Medicare HMO

## 2022-01-23 DIAGNOSIS — E1121 Type 2 diabetes mellitus with diabetic nephropathy: Secondary | ICD-10-CM

## 2022-01-23 DIAGNOSIS — I10 Essential (primary) hypertension: Secondary | ICD-10-CM

## 2022-01-23 NOTE — Progress Notes (Signed)
Chronic Care Management Pharmacy Note  01/29/2022 Name:  Christine Roberts MRN:  [id number] DOB:  [birth year]  Summary: Patient presents for CCM follow-up.  Recommendations/Changes made from today's visit: -Continue current medications  Subjective: Christine Roberts is an 76 y.o. year old female who is a primary patient of Steele Sizer, MD.  The CCM team was consulted for assistance with disease management and care coordination needs.    Engaged with patient by telephone for follow up visit in response to provider referral for pharmacy case management and/or care coordination services.   Consent to Services:  The patient was given information about Chronic Care Management services, agreed to services, and gave verbal consent prior to initiation of services.  Please see initial visit note for detailed documentation.   Patient Care Team: Steele Sizer, MD as PCP - General (Family Medicine) Dingeldein, Remo Lipps, MD as Consulting Physician (Ophthalmology) Billey Co, MD as Consulting Physician (Urology) Efrain Sella, MD as Consulting Physician (Gastroenterology) Germaine Pomfret, [facility] (Pharmacist) Debroah Loop, PA-C as Physician Assistant (Urology)  Recent office visits: 10/22/21: Patient presented to Dr. Ancil Boozer for follow-up.   Recent consult visits: 06/21/21: Patient presented to Debroah Loop, PA-C (Urology) for flank pain. Oxybutynin 5 mg nightly  05/24/21: Patient presented to Debroah Loop, PA-C (Urology) for nocturia. Desmopression 0.05 mg nightly.   Hospital visits: None in previous 6 months   Objective:  Lab Results  Component Value Date   CREATININE 0.77 06/21/2021   BUN 11 06/21/2021   GFRNONAA 59 (L) 02/09/2020   GFRAA 69 02/09/2020   NA 140 06/21/2021   K 4.4 06/21/2021   CALCIUM 9.4 06/21/2021   CO2 24 06/21/2021   GLUCOSE 129 (H) 06/21/2021    Lab Results  Component Value Date/Time   HGBA1C 7.0 (H) 10/22/2021 10:58 AM    HGBA1C 6.8 (A) 06/15/2021 11:22 AM   HGBA1C 7.2 (A) 02/12/2021 02:02 PM   HGBA1C 7.7 (H) 11/23/2018 08:05 AM   HGBA1C 9.1 (A) 07/21/2018 11:48 AM   HGBA1C 8.1 (A) 03/30/2018 04:05 PM   MICROALBUR 0.3 02/12/2021 02:34 PM   MICROALBUR 0.4 02/09/2020 11:17 AM   MICROALBUR 20 01/10/2017 01:39 PM   MICROALBUR 20 09/12/2015 03:42 PM    Last diabetic Eye exam:  Lab Results  Component Value Date/Time   HMDIABEYEEXA No Retinopathy 04/01/2018 12:00 AM    Last diabetic Foot exam: No results found for: "HMDIABFOOTEX"   Lab Results  Component Value Date   CHOL 147 02/12/2021   HDL 63 02/12/2021   LDLCALC 68 02/12/2021   TRIG 82 02/12/2021   CHOLHDL 2.3 02/12/2021       Latest Ref Rng & Units 02/12/2021    2:34 PM 02/09/2020   11:17 AM 11/23/2018    8:05 AM  Hepatic Function  Total Protein 6.1 - 8.1 g/dL 6.6  7.0  6.3   AST 10 - 35 U/L '14  13  11   '$ ALT 6 - 29 U/L '13  11  10   '$ Total Bilirubin 0.2 - 1.2 mg/dL 0.5  0.5  0.4     Lab Results  Component Value Date/Time   TSH 1.290 04/04/2015 08:10 AM       Latest Ref Rng & Units 02/12/2021    2:34 PM 02/09/2020   11:17 AM 11/23/2018    8:05 AM  CBC  WBC 3.8 - 10.8 Thousand/uL 9.9  9.0  7.4   Hemoglobin 11.7 - 15.5 g/dL 12.2  12.4  12.0  Hematocrit 35.0 - 45.0 % 37.4  38.4  36.1   Platelets 140 - 400 Thousand/uL 324  299  272     Lab Results  Component Value Date/Time   VD25OH 34.4 04/04/2015 08:10 AM    Clinical ASCVD: Yes  The 10-year ASCVD risk score (Arnett DK, et al., 2019) is: 52.3%   Values used to calculate the score:     Age: 9 years     Sex: Female     Is Non-Hispanic African American: No     Diabetic: Yes     Tobacco smoker: Yes     Systolic Blood Pressure: 161 mmHg     Is BP treated: Yes     HDL Cholesterol: 63 mg/dL     Total Cholesterol: 147 mg/dL       10/22/2021   10:14 AM 08/23/2021    2:52 PM 06/15/2021   11:09 AM  Depression screen PHQ 2/9  Decreased Interest 0 0 0  Down, Depressed, Hopeless 0 1 0   PHQ - 2 Score 0 1 0  Altered sleeping 0 0 0  Tired, decreased energy 0 0 0  Change in appetite 0 0 0  Feeling bad or failure about yourself  0 0 0  Trouble concentrating 0 0 0  Moving slowly or fidgety/restless 0 0 0  Suicidal thoughts 0 0 0  PHQ-9 Score 0 1 0  Difficult doing work/chores  Not difficult at all Not difficult at all    Social History   Tobacco Use  Smoking Status Never  Smokeless Tobacco Never  Tobacco Comments   smoking cessation materials not required   BP Readings from Last 3 Encounters:  10/22/21 122/70  08/23/21 120/80  06/21/21 126/77   Pulse Readings from Last 3 Encounters:  10/22/21 91  08/23/21 98  06/21/21 85   Wt Readings from Last 3 Encounters:  10/22/21 191 lb (86.6 kg)  08/23/21 192 lb 12.8 oz (87.5 kg)  06/21/21 197 lb (89.4 kg)   BMI Readings from Last 3 Encounters:  10/22/21 32.79 kg/m  08/23/21 33.09 kg/m  06/21/21 33.81 kg/m    Assessment/Interventions: Review of patient past medical history, allergies, medications, health status, including review of consultants reports, laboratory and other test data, was performed as part of comprehensive evaluation and provision of chronic care management services.   SDOH:  (Social Determinants of Health) assessments and interventions performed: Yes     SDOH Screenings   Alcohol Screen: Low Risk  (08/23/2021)   Alcohol Screen    Last Alcohol Screening Score (AUDIT): 2  Depression (PHQ2-9): Low Risk  (10/22/2021)   Depression (PHQ2-9)    PHQ-2 Score: 0  Financial Resource Strain: Low Risk  (08/23/2021)   Overall Financial Resource Strain (CARDIA)    Difficulty of Paying Living Expenses: Not very hard  Food Insecurity: No Food Insecurity (08/23/2021)   Hunger Vital Sign    Worried About Running Out of Food in the Last Year: Never true    Ran Out of Food in the Last Year: Never true  Housing: Low Risk  (08/23/2021)   Housing    Last Housing Risk Score: 0  Physical Activity: Inactive  (08/23/2021)   Exercise Vital Sign    Days of Exercise per Week: 0 days    Minutes of Exercise per Session: 0 min  Social Connections: Moderately Isolated (08/23/2021)   Social Connection and Isolation Panel [NHANES]    Frequency of Communication with Friends and Family: More than three times a  week    Frequency of Social Gatherings with Friends and Family: Three times a week    Attends Religious Services: More than 4 times per year    Active Member of Clubs or Organizations: No    Attends Archivist Meetings: Never    Marital Status: Widowed  Stress: No Stress Concern Present (08/23/2021)   [place]    Feeling of Stress : Not at all  Tobacco Use: Low Risk  (11/27/2021)   Patient History    Smoking Tobacco Use: Never    Smokeless Tobacco Use: Never    Passive Exposure: Not on file  Transportation Needs: No Transportation Needs (08/23/2021)   PRAPARE - Transportation    Lack of Transportation (Medical): No    Lack of Transportation (Non-Medical): No    CCM Care Plan  Allergies  Allergen Reactions   Latex Rash   Erythromycin Other (See Comments)    Strawberry tongue    Medications Reviewed Today     Reviewed by Ralene Bathe, MD (Physician) on 11/27/21 at [place] List Status: <None>   Medication Order Taking? Sig Documenting Provider Last Dose Status Informant  ACCU-CHEK SOFTCLIX LANCETS lancets [id number] Yes 1 each by Other route daily. Steele Sizer, MD Taking Active   acetaminophen (TYLENOL) 650 MG CR tablet [id number] Yes Take 650 mg by mouth every 8 (eight) hours as needed. [provider] Taking Active   Alcohol Swabs (B-D SINGLE USE SWABS REGULAR) PADS [id number] Yes 1 each by Does not apply route daily. Steele Sizer, MD Taking Active   aspirin EC 81 MG tablet [id number] Yes Take 1 tablet (81 mg total) by mouth daily. Steele Sizer, MD Taking Active Self  atorvastatin (LIPITOR)  40 MG tablet [id number] Yes TAKE 1 TABLET 2 TIMES A WEEK. Steele Sizer, MD Taking Active   calcium citrate (CALCITRATE - DOSED IN MG ELEMENTAL CALCIUM) 950 MG tablet [id number] Yes Take 200 mg of elemental calcium by mouth daily. Has D3 in it as well [provider] Taking Active Self  Cholecalciferol (VITAMIN D) 125 MCG (5000 UT) CAPS [id number] Yes Take by mouth. Pt taking 3 times per week [provider] Taking Active   Coenzyme Q10 (COQ10) 100 MG CAPS [id number] Yes Take 1 capsule by mouth daily. [provider] Taking Active   doxycycline (VIBRA-TABS) 100 MG tablet [id number] Yes Take 1 tablet (100 mg total) by mouth 2 (two) times daily. Steele Sizer, MD Taking Active   fluticasone [facility]) 50 MCG/ACT nasal spray [id number] Yes Place 2 sprays into both nostrils daily. Steele Sizer, MD Taking Active            Med Note Barbarann Ehlers Apr 12, 2021  2:47 PM)    glucose blood (ACCU-CHEK AVIVA PLUS) test strip [id number] Yes TEST BLOOD SUGAR UP TO FOUR TIMES DAILY AS DIRECTED Steele Sizer, MD Taking Active   insulin degludec (TRESIBA FLEXTOUCH) 200 UNIT/ML FlexTouch Pen [id number] Yes Inject 28 Units into the skin daily. Patient receives through [employer] Patient Assistance through Dec 2022 Ancil Boozer, Drue Stager, MD Taking Active   Insulin Pen Needle (NOVOFINE) 30G X 8 MM MISC [id number] Yes Inject 10 each into the skin as needed. Steele Sizer, MD Taking Active   lisinopril (ZESTRIL) 10 MG tablet [id number]  TAKE 1 TABLET EVERY DAY Steele Sizer, MD  Active   metFORMIN (GLUCOPHAGE-XR) 750 MG 24 hr tablet [id number] Yes TAKE 1 TABLET EVERY DAY WITH BREAKFAST  Steele Sizer, MD Taking Active   polyethylene glycol powder [facility]) powder [id number] Yes Take 17 g by mouth 2 (two) times daily as needed. Steele Sizer, MD Taking Active   Semaglutide, 1 MG/DOSE, 4 MG/3ML SOPN [id number] Yes Inject 1 mg as directed once a week. Patient receives through [employer] Patient Assistance through Dec 2022 Steele Sizer, MD Taking Active   VITAMIN B COMPLEX-C PO [id number] Yes Take by mouth. [provider] Taking Active             Patient Active Problem List   Diagnosis Date Noted   Atherosclerosis of aorta ([place]) 05/25/2018   Seasonal allergic rhinitis 09/12/2015   TIA (transient ischemic attack) 05/24/2015   Senile purpura ([place]) 05/09/2015   Benign essential HTN 03/18/2015   Pain in shoulder 03/18/2015   Dyslipidemia 03/18/2015   History of surgery to major organs, presenting hazards to health 03/18/2015   Hearing loss 03/18/2015   Hiatal hernia 03/18/2015   History of colon polyps 03/18/2015   Calculus of kidney 03/18/2015   B12 deficiency 03/18/2015   Microalbuminuria 03/18/2015   Adult BMI 30+ 03/18/2015   Arthritis, degenerative 03/18/2015   Diabetes mellitus with nephropathy ([place]) 03/18/2015    Immunization History  Administered Date(s) Administered   Fluad Quad(high Dose 65+) 03/24/2019   Influenza Split 04/11/2009   Influenza, High Dose Seasonal PF 03/21/2015, 05/10/2016, 03/30/2017, 03/30/2018   Influenza, Seasonal, Injecte, Preservative Fre 04/07/2012   Influenza,inj,Quad PF,6+ Mos 03/07/2014   Influenza-Unspecified 03/07/2014, 03/30/2017, 05/15/2020, 05/30/2021   PFIZER(Purple Top)SARS-COV-2 Vaccination 07/15/2019, 08/05/2019, 06/27/2020   Pneumococcal Conjugate-13 03/21/2015   Pneumococcal Polysaccharide-23 10/25/2009   Tdap 10/25/2009, 03/14/2018   Zoster, Live 09/24/2011    Conditions to be addressed/monitored:  Hypertension, Hyperlipidemia, Diabetes, Coronary Artery Disease, Osteoarthritis and History of TIA  Care Plan : General Pharmacy (Adult)  Updates made by Germaine Pomfret, RPH since 01/29/2022 12:00 AM     Problem: Hypertension, Hyperlipidemia, Diabetes, Coronary Artery Disease, Osteoarthritis and History of TIA   Priority: High     Long-Range Goal: Patient-Specific Goal   Start  Date: 12/06/2020  Expected End Date: 07/18/2022  This Visit's Progress: On track  Recent Progress: On track  Priority: High  Note:   Current Barriers:  Unable to independently afford treatment regimen  Pharmacist Clinical Goal(s):  Patient will verbalize ability to afford treatment regimen maintain control of diabetes as evidenced by A1c less than 8%  through collaboration with PharmD and provider.   Interventions: 1:1 collaboration with Steele Sizer, MD regarding development and update of comprehensive plan of care as evidenced by provider attestation and co-signature Inter-disciplinary care team collaboration (see longitudinal plan of care) Comprehensive medication review performed; medication list updated in electronic medical record  Hypertension (BP goal <140/90) -Controlled -Current treatment: Lisinopril 10 mg daily  -Medications previously tried: NA  -Current home readings: Does not monitor -Current dietary habits: Does not eat often after 5pm. Largest meal is around 2pm, will have a small snack in the evening.  -Current exercise habits: [employer] work, previously was walking but not recently.  -Denies hypotensive/hypertensive symptoms -Recommended to continue current medication  Hyperlipidemia: (LDL goal < 70) -Controlled  -History of TIA  -Current treatment: Atorvastatin 40 mg twice weekly  -Current antiplatelet treatment: Aspirin 81 mg daily -Medications previously tried: NA  -Myalgias managable, continues to take CoQ10 supplementation which she feels helps her manage some of her symptoms. -Educated on Benefits of statin for ASCVD risk reduction; -Recommended to continue current medication  Diabetes (  A1c goal <8%) -Controlled -Current medications: Metformin XR 750 mg daily: Appropriate, Effective, Safe, Accessible Ozempic 1 mg weekly: Appropriate, Effective, Safe, Accessible Tresiba 20 units daily (0.31 u/kg): Appropriate, Effective, Safe, Accessible -Medications  previously tried: NA  -She has been slowly been decreasing her insulin due to borderline readings.  -Current home glucose readings fasting glucose: 116 (pizza), 92, 106, 99, 85, 93, 73 (skipped dessert), 104, 110, 94, 123  -Continue current medications  Nocturia (Goal: Minimize symptoms) -Not ideally controlled -Current treatment  None -Medications previously tried: Desmopressin (fluid retention, dizziness, and weird dreams ), Oxybutynin (insomnia)   --Wakes up 2-3 times nightly to use restroom. Feels side effects from trialed medications were not tolerable.  -Counseled patient to limit fluid intake in the evening, scheduled voiding.   Patient Goals/Self-Care Activities Patient will:  - check glucose daily, document, and provide at future appointments check blood pressure weekly, document, and provide at future appointments  Follow Up Plan: Telephone follow up appointment with care management team member scheduled for:  07/10/2022 at 3:00 PM     Medication Assistance:  [place] and [place] obtained through [employer] medication assistance program.  Enrollment ends Dec 2023  Compliance/Adherence/Medication fill history: Care Gaps: [facility] Ophthalmology Exam  Star-Rating Drugs: Atorvastatin 40 mg twice weekly: Last filled 10/05/20 for 84-DS Lisinopril 5 mg daily: Last filled 10/06/20 for 90-DS Metformin XR 750 mg daily: Last filled 08/21/20 and 06/14/20 for 90-DS.   Patient's preferred pharmacy is:  [facility], [place] [address] Phone: [phone] Fax: [phone]  [facility] [address], [place] - [place] [address] Phone: [phone] Fax: [phone]   Uses pill box? Yes Pt endorses 100% compliance  We discussed: Current pharmacy is preferred with insurance plan and patient is satisfied with pharmacy services Patient decided to: Continue current  medication management strategy  Care Plan and Follow Up Patient Decision:  Patient agrees to Care Plan and Follow-up.  Plan: Telephone follow up appointment with care management team member scheduled for:  07/10/2022 at 3:00 PM  Malva Limes, [facility] [phone]

## 2022-01-29 NOTE — Patient Instructions (Signed)
Visit Information It was great speaking with you today!  Please let me know if you have any questions about our visit.   Goals Addressed             This Visit's Progress    Monitor and Manage My Blood Sugar-Diabetes Type 2   On track    Timeframe:  Long-Range Goal Priority:  High Start Date: 12/06/2020                            Expected End Date: 06/07/2022                      Follow Up within 90 days   - check blood sugar at prescribed times - check blood sugar if I feel it is too high or too low - take the blood sugar log to all doctor visits    Why is this important?   Checking your blood sugar at home helps to keep it from getting very high or very low.  Writing the results in a diary or log helps the doctor know how to care for you.  Your blood sugar log should have the time, date and the results.  Also, write down the amount of insulin or other medicine that you take.  Other information, like what you ate, exercise done and how you were feeling, will also be helpful.     Notes:         Patient Care Plan: General Pharmacy (Adult)     Problem Identified: Hypertension, Hyperlipidemia, Diabetes, Coronary Artery Disease, Osteoarthritis and History of TIA   Priority: High     Long-Range Goal: Patient-Specific Goal   Start Date: 12/06/2020  Expected End Date: 07/18/2022  This Visit's Progress: On track  Recent Progress: On track  Priority: High  Note:   Current Barriers:  Unable to independently afford treatment regimen  Pharmacist Clinical Goal(s):  Patient will verbalize ability to afford treatment regimen maintain control of diabetes as evidenced by A1c less than 8%  through collaboration with PharmD and provider.   Interventions: 1:1 collaboration with Steele Sizer, MD regarding development and update of comprehensive plan of care as evidenced by provider attestation and co-signature Inter-disciplinary care team collaboration (see longitudinal plan of  care) Comprehensive medication review performed; medication list updated in electronic medical record  Hypertension (BP goal <140/90) -Controlled -Current treatment: Lisinopril 10 mg daily  -Medications previously tried: NA  -Current home readings: Does not monitor -Current dietary habits: Does not eat often after 5pm. Largest meal is around 2pm, will have a small snack in the evening.  -Current exercise habits: [employer] work, previously was walking but not recently.  -Denies hypotensive/hypertensive symptoms -Recommended to continue current medication  Hyperlipidemia: (LDL goal < 70) -Controlled  -History of TIA  -Current treatment: Atorvastatin 40 mg twice weekly  -Current antiplatelet treatment: Aspirin 81 mg daily -Medications previously tried: NA  -Myalgias managable, continues to take CoQ10 supplementation which she feels helps her manage some of her symptoms. -Educated on Benefits of statin for ASCVD risk reduction; -Recommended to continue current medication  Diabetes (A1c goal <8%) -Controlled -Current medications: Metformin XR 750 mg daily: Appropriate, Effective, Safe, Accessible Ozempic 1 mg weekly: Appropriate, Effective, Safe, Accessible Tresiba 20 units daily (0.31 u/kg): Appropriate, Effective, Safe, Accessible -Medications previously tried: NA  -She has been slowly been decreasing her insulin due to borderline readings.  -Current home glucose readings fasting glucose: 116 (  pizza), 92, 106, 99, 85, 93, 73 (skipped dessert), 104, 110, 94, 123  -Continue current medications  Nocturia (Goal: Minimize symptoms) -Not ideally controlled -Current treatment  None -Medications previously tried: Desmopressin (fluid retention, dizziness, and weird dreams ), Oxybutynin (insomnia)   --Wakes up 2-3 times nightly to use restroom. Feels side effects from trialed medications were not tolerable.  -Counseled patient to limit fluid intake in the evening, scheduled voiding.    Patient Goals/Self-Care Activities Patient will:  - check glucose daily, document, and provide at future appointments check blood pressure weekly, document, and provide at future appointments  Follow Up Plan: Telephone follow up appointment with care management team member scheduled for:  07/10/2022 at 3:00 PM    Patient agreed to services and verbal consent obtained.   Patient verbalizes understanding of instructions and care plan provided today and agrees to view in [place]. Active MyChart status and patient understanding of how to access instructions and care plan via MyChart confirmed with patient.     Malva Limes, [place] Pharmacist Practitioner  [facility] [phone]

## 2022-02-04 DIAGNOSIS — I10 Essential (primary) hypertension: Secondary | ICD-10-CM

## 2022-02-04 DIAGNOSIS — E1121 Type 2 diabetes mellitus with diabetic nephropathy: Secondary | ICD-10-CM

## 2022-02-18 NOTE — Progress Notes (Unsigned)
Name: Christine Roberts   MRN: [id number]    DOB: 01-24-1946   Date:02/19/2022       Progress Note  Subjective  Chief Complaint  Follow Up  HPI  DMII: she is on Metformin 750 mg daily , Tresiba 20  units , Ozempic 1 ( through assistance program) . Last visit she was on higher dose of Tresiba  but fasting was in the 80's now fasting between 110-130's, A1C is up trom 7 % to 7.3 % but glucose in a safer range, discussed going up on Ozempic from 1 mg to 2 mg for the next order cycle.  . She has dyslipidemia and obesity, neuropathy, nephrophaty   also HTN . Taking statins and ACE.  HTN : compliant with medication,  no chest pain or palpitation. Doing well on lisinopril 10 mg daily . Continue current regiment   Obesity: her weight is stable now, BMI is below 35. She still eats smaller portions, Ozempic suppress her appetite and weight is down 5 lbs since last visit    Hyperlipidemia: taking lipitor one two or three times a week to avoid side effects, otherwise she has leg pains. Last LDL was 68 , we will recheck it today    TIA versus Complicated migraine: she developed dizziness, headache and aphasia  on 05/24/2019 - she was transported by EMS to [facility], she had normal Echo, CT, MRI of brain and carotid dopplers.  She was seen by neurologist and advised to continue on aspirin and lipitor in case it was a TIA, but likely a complicated migraine. Unchanged   B12 deficiency: taking it otc three times a week , last level was at goal . We will recheck level since she has noticed some tingling and numbness of her feet , different than her usual neuropathy    Nephrolithiasis: seen by Urologist and no intervention required. No recent episodes   Senile purpura:  skin very thin, has it on both arms today,, stable    Atherosclerosis of aorta: continue statin therapy and aspirin 81 mg daily , she is now taking Atorvastatin a few times a week.and LDL has been at goal , she has cramping when she takes Atorvastatin daily    Grade III hemorrhoids; seen by Dr. Alice Reichert - GI and was referred to general surgeon for possible banding, but she wants to wait until her next colonoscopy in 2024 , she wants to be under anesthesia .Unchanged     Patient Active Problem List   Diagnosis Date Noted   Atherosclerosis of aorta ([place]) 05/25/2018   Seasonal allergic rhinitis 09/12/2015   TIA (transient ischemic attack) 05/24/2015   Senile purpura ([place]) 05/09/2015   Benign essential HTN 03/18/2015   Pain in shoulder 03/18/2015   Dyslipidemia 03/18/2015   History of surgery to major organs, presenting hazards to health 03/18/2015   Hearing loss 03/18/2015   Hiatal hernia 03/18/2015   History of colon polyps 03/18/2015   Calculus of kidney 03/18/2015   B12 deficiency 03/18/2015   Microalbuminuria 03/18/2015   Adult BMI 30+ 03/18/2015   Arthritis, degenerative 03/18/2015   Diabetes mellitus with nephropathy ([place]) 03/18/2015    Past Surgical History:  Procedure Laterality Date   ABDOMINAL HYSTERECTOMY     CHOLECYSTECTOMY     COLONOSCOPY  2015   COLONOSCOPY WITH PROPOFOL N/A 03/04/2018   Procedure: COLONOSCOPY WITH PROPOFOL;  Surgeon: Toledo, Benay Pike, MD;  Location: [facility] ENDOSCOPY;  Service: Gastroenterology;  Laterality: N/A;   cystoscopic     stent  placed in ureter   LITHOTRIPSY  12/2007   MYRINGOTOMY Right 11/2009    Family History  Problem Relation Age of Onset   Cancer Mother    Diabetes Mother    Diabetes Daughter    CAD Father    Diabetes Daughter        hypoglycemia    Social History   Tobacco Use   Smoking status: Never   Smokeless tobacco: Never   Tobacco comments:    smoking cessation materials not required  Substance Use Topics   Alcohol use: Yes    Alcohol/week: 1.0 standard drink of alcohol    Types: 1 Shots of liquor per week    Comment: socially     Current Outpatient Medications:    ACCU-CHEK SOFTCLIX LANCETS lancets, 1 each by Other route daily., Disp: 100 each, Rfl: 1    acetaminophen (TYLENOL) 650 MG CR tablet, Take 650 mg by mouth every 8 (eight) hours as needed., Disp: , Rfl:    Alcohol Swabs (B-D SINGLE USE SWABS REGULAR) PADS, 1 each by Does not apply route daily., Disp: 400 each, Rfl: 1   aspirin EC 81 MG tablet, Take 1 tablet (81 mg total) by mouth daily., Disp: 90 tablet, Rfl: 0   atorvastatin (LIPITOR) 40 MG tablet, TAKE 1 TABLET 2 TIMES A WEEK., Disp: 24 tablet, Rfl: 1   calcium citrate (CALCITRATE - DOSED IN MG ELEMENTAL CALCIUM) 950 MG tablet, Take 200 mg of elemental calcium by mouth daily. Has D3 in it as well, Disp: , Rfl:    Cholecalciferol (VITAMIN D) 125 MCG (5000 UT) CAPS, Take by mouth. Pt taking 3 times per week, Disp: , Rfl:    Coenzyme Q10 (COQ10) 100 MG CAPS, Take 1 capsule by mouth daily., Disp: , Rfl:    glucose blood (ACCU-CHEK AVIVA PLUS) test strip, TEST BLOOD SUGAR UP TO FOUR TIMES DAILY AS DIRECTED, Disp: 400 each, Rfl: 0   insulin degludec (TRESIBA FLEXTOUCH) 200 UNIT/ML FlexTouch Pen, Inject 28 Units into the skin daily. Patient receives through [employer] Patient Assistance through Dec 2022, Disp: , Rfl:    Insulin Pen Needle (NOVOFINE) 30G X 8 MM MISC, Inject 10 each into the skin as needed., Disp: 100 each, Rfl: 2   lisinopril (ZESTRIL) 10 MG tablet, TAKE 1 TABLET EVERY DAY, Disp: 90 tablet, Rfl: 1   metFORMIN (GLUCOPHAGE-XR) 750 MG 24 hr tablet, TAKE 1 TABLET EVERY DAY WITH BREAKFAST, Disp: 90 tablet, Rfl: 1   Semaglutide, 1 MG/DOSE, 4 MG/3ML SOPN, Inject 1 mg as directed once a week. Patient receives through [employer] Patient Assistance through Dec 2022, Disp: , Rfl:    VITAMIN B COMPLEX-C PO, Take by mouth., Disp: , Rfl:    polyethylene glycol powder (GLYCOLAX/MIRALAX) powder, Take 17 g by mouth 2 (two) times daily as needed. (Patient not taking: Reported on 02/19/2022), Disp: 3350 g, Rfl: 1  Allergies  Allergen Reactions   Latex Rash   Erythromycin Other (See Comments)    Strawberry tongue    I personally reviewed  active problem list, medication list, allergies, family history, social history, health maintenance with the patient/caregiver today.   ROS  Constitutional: Negative for fever , positive for  weight change.  Respiratory: Negative for cough and shortness of breath.   Cardiovascular: Negative for chest pain or palpitations.  Gastrointestinal: Negative for abdominal pain, no bowel changes.  Musculoskeletal: Negative for gait problem or joint swelling.  Skin: Negative for rash.  Neurological: Negative for dizziness or headache.  No other specific complaints in a complete review of systems (except as listed in HPI above).   Objective  Vitals:   02/19/22 0957  BP: 124/72  Pulse: 92  Resp: 16  SpO2: 97%  Weight: 186 lb (84.4 kg)  Height: '5\' 4"'$  (1.626 m)    Body mass index is 31.93 kg/m.  Physical Exam  Constitutional: Patient appears well-developed and well-nourished. Obese  No distress.  HEENT: head atraumatic, normocephalic, pupils equal and reactive to light, neck supple Cardiovascular: Normal rate, regular rhythm and normal heart sounds.  No murmur heard. No BLE edema. Pulmonary/Chest: Effort normal and breath sounds normal. No respiratory distress. Abdominal: Soft.  There is no tenderness. Psychiatric: Patient has a normal mood and affect. behavior is normal. Judgment and thought content normal.   Recent Results (from the past 2160 hour(s))  POCT HgB A1C     Status: Abnormal   Collection Time: 02/19/22  9:58 AM  Result Value Ref Range   Hemoglobin A1C 7.3 (A) 4.0 - 5.6 %   HbA1c POC (<> result, manual entry)     HbA1c, POC (prediabetic range)     HbA1c, POC (controlled diabetic range)       PHQ2/9:    02/19/2022    9:58 AM 10/22/2021   10:14 AM 08/23/2021    2:52 PM 06/15/2021   11:09 AM 03/15/2021   11:43 AM  Depression screen PHQ 2/9  Decreased Interest 0 0 0 0 0  Down, Depressed, Hopeless 0 0 1 0 0  PHQ - 2 Score 0 0 1 0 0  Altered sleeping 0 0 0 0 0  Tired,  decreased energy 0 0 0 0 0  Change in appetite 3 0 0 0 0  Feeling bad or failure about yourself  0 0 0 0 0  Trouble concentrating 0 0 0 0 0  Moving slowly or fidgety/restless 0 0 0 0 0  Suicidal thoughts 0 0 0 0 0  PHQ-9 Score 3 0 1 0 0  Difficult doing work/chores   Not difficult at all Not difficult at all Not difficult at all    phq 9 is negative   Fall Risk:    02/19/2022    9:58 AM 10/22/2021   10:14 AM 08/23/2021    2:55 PM 06/15/2021   11:09 AM 03/15/2021   11:42 AM  Fall Risk   Falls in the past year? 0 0 0 0 0  Number falls in past yr: 0 0 0 0 0  Injury with Fall? 0 0 0 0 0  Risk for fall due to : No Fall Risks No Fall Risks No Fall Risks No Fall Risks   Follow up Falls prevention discussed Falls prevention discussed Falls prevention discussed Falls prevention discussed       Functional Status Survey: Is the patient deaf or have difficulty hearing?: No Does the patient have difficulty seeing, even when wearing glasses/contacts?: No Does the patient have difficulty concentrating, remembering, or making decisions?: No Does the patient have difficulty walking or climbing stairs?: Yes Does the patient have difficulty dressing or bathing?: No Does the patient have difficulty doing errands alone such as visiting a doctor's office or shopping?: No    Assessment & Plan  1. Diabetes mellitus with nephropathy (HCC)  - POCT HgB A1C - Microalbumin / creatinine urine ratio  2. Senile purpura ([place])   3. Hypertension associated with type 2 diabetes mellitus (HCC)  - COMPLETE METABOLIC PANEL WITH GFR - CBC with Differential/Platelet  4. Atherosclerosis of aorta (HCC)  - Lipid panel  5. Benign essential HTN   6. B12 deficiency  - B12 and Folate Panel - CBC with Differential/Platelet  7. Dyslipidemia   8. Hx-TIA (transient ischemic attack)   9. Need for shingles vaccine  - Zoster Vaccine Adjuvanted [facility]) injection; Inject 0.5 mLs into the muscle once  for 1 dose.  Dispense: 0.5 mL; Refill: 1  10. Vitamin D deficiency  - VITAMIN D 25 Hydroxy (Vit-D Deficiency, Fractures)  11. Type 2 diabetes with nephropathy (HCC)  - metFORMIN (GLUCOPHAGE-XR) 750 MG 24 hr tablet; Take 1 tablet (750 mg total) by mouth daily with breakfast.  Dispense: 90 tablet; Refill: 1

## 2022-02-19 ENCOUNTER — Ambulatory Visit (INDEPENDENT_AMBULATORY_CARE_PROVIDER_SITE_OTHER): Payer: Medicare HMO | Admitting: Family Medicine

## 2022-02-19 ENCOUNTER — Encounter: Payer: Self-pay | Admitting: Family Medicine

## 2022-02-19 VITALS — BP 124/72 | HR 92 | Resp 16 | Ht 64.0 in | Wt 186.0 lb

## 2022-02-19 DIAGNOSIS — Z23 Encounter for immunization: Secondary | ICD-10-CM

## 2022-02-19 DIAGNOSIS — E1159 Type 2 diabetes mellitus with other circulatory complications: Secondary | ICD-10-CM

## 2022-02-19 DIAGNOSIS — Z8673 Personal history of transient ischemic attack (TIA), and cerebral infarction without residual deficits: Secondary | ICD-10-CM

## 2022-02-19 DIAGNOSIS — I700 Atherosclerosis of aorta: Secondary | ICD-10-CM | POA: Diagnosis not present

## 2022-02-19 DIAGNOSIS — D692 Other nonthrombocytopenic purpura: Secondary | ICD-10-CM

## 2022-02-19 DIAGNOSIS — E559 Vitamin D deficiency, unspecified: Secondary | ICD-10-CM | POA: Diagnosis not present

## 2022-02-19 DIAGNOSIS — E785 Hyperlipidemia, unspecified: Secondary | ICD-10-CM

## 2022-02-19 DIAGNOSIS — E538 Deficiency of other specified B group vitamins: Secondary | ICD-10-CM

## 2022-02-19 DIAGNOSIS — E1121 Type 2 diabetes mellitus with diabetic nephropathy: Secondary | ICD-10-CM | POA: Diagnosis not present

## 2022-02-19 DIAGNOSIS — I152 Hypertension secondary to endocrine disorders: Secondary | ICD-10-CM

## 2022-02-19 DIAGNOSIS — I10 Essential (primary) hypertension: Secondary | ICD-10-CM | POA: Diagnosis not present

## 2022-02-19 LAB — POCT GLYCOSYLATED HEMOGLOBIN (HGB A1C): Hemoglobin A1C: 7.3 % — AB (ref 4.0–5.6)

## 2022-02-19 MED ORDER — SHINGRIX 50 MCG/0.5ML IM SUSR
0.5000 mL | Freq: Once | INTRAMUSCULAR | 1 refills | Status: AC
Start: 2022-02-19 — End: 2022-02-19

## 2022-02-19 MED ORDER — METFORMIN HCL ER 750 MG PO TB24: 750.0000 mg | ORAL_TABLET | Freq: Every day | ORAL | 1 refills | Status: DC

## 2022-02-20 ENCOUNTER — Telehealth: Payer: Self-pay

## 2022-02-20 LAB — CBC WITH DIFFERENTIAL/PLATELET
Absolute Monocytes: 743 cells/uL (ref 200–950)
Basophils Absolute: 40 cells/uL (ref 0–200)
Basophils Relative: 0.4 %
Eosinophils Absolute: 139 cells/uL (ref 15–500)
Eosinophils Relative: 1.4 %
HCT: 39.5 % (ref 35.0–45.0)
Hemoglobin: 13 g/dL (ref 11.7–15.5)
Lymphs Abs: 2356 cells/uL (ref 850–3900)
MCH: 28.3 pg (ref 27.0–33.0)
MCHC: 32.9 g/dL (ref 32.0–36.0)
MCV: 85.9 fL (ref 80.0–100.0)
MPV: 10.3 fL (ref 7.5–12.5)
Monocytes Relative: 7.5 %
Neutro Abs: 6623 cells/uL (ref 1500–7800)
Neutrophils Relative %: 66.9 %
Platelets: 304 10*3/uL (ref 140–400)
RBC: 4.6 10*6/uL (ref 3.80–5.10)
RDW: 13.7 % (ref 11.0–15.0)
Total Lymphocyte: 23.8 %
WBC: 9.9 10*3/uL (ref 3.8–10.8)

## 2022-02-20 LAB — COMPLETE METABOLIC PANEL WITH GFR
AG Ratio: 1.4 (calc) (ref 1.0–2.5)
ALT: 18 U/L (ref 6–29)
AST: 15 U/L (ref 10–35)
Albumin: 4.1 g/dL (ref 3.6–5.1)
Alkaline phosphatase (APISO): 82 U/L (ref 37–153)
BUN: 10 mg/dL (ref 7–25)
CO2: 23 mmol/L (ref 20–32)
Calcium: 9.5 mg/dL (ref 8.6–10.4)
Chloride: 104 mmol/L (ref 98–110)
Creat: 0.87 mg/dL (ref 0.60–1.00)
Globulin: 2.9 g/dL (calc) (ref 1.9–3.7)
Glucose, Bld: 132 mg/dL — ABNORMAL HIGH (ref 65–99)
Potassium: 4.4 mmol/L (ref 3.5–5.3)
Sodium: 140 mmol/L (ref 135–146)
Total Bilirubin: 0.5 mg/dL (ref 0.2–1.2)
Total Protein: 7 g/dL (ref 6.1–8.1)
eGFR: 69 mL/min/{1.73_m2} (ref >=60)

## 2022-02-20 LAB — MICROALBUMIN / CREATININE URINE RATIO
Creatinine, Urine: 176 mg/dL (ref 20–275)
Microalb Creat Ratio: 5 mcg/mg creat (ref <30)
Microalb, Ur: 0.8 mg/dL

## 2022-02-20 LAB — LIPID PANEL
Cholesterol: 196 mg/dL (ref <200)
HDL: 61 mg/dL (ref >=50)
LDL Cholesterol (Calc): 101 mg/dL (calc) — ABNORMAL HIGH
Non-HDL Cholesterol (Calc): 135 mg/dL (calc) — ABNORMAL HIGH (ref <130)
Total CHOL/HDL Ratio: 3.2 (calc) (ref <5.0)
Triglycerides: 224 mg/dL — ABNORMAL HIGH (ref <150)

## 2022-02-20 LAB — B12 AND FOLATE PANEL
Folate: 12.3 ng/mL
Vitamin B-12: 928 pg/mL (ref 200–1100)

## 2022-02-20 LAB — VITAMIN D 25 HYDROXY (VIT D DEFICIENCY, FRACTURES): Vit D, 25-Hydroxy: 43 ng/mL (ref 30–100)

## 2022-02-20 MED ORDER — DAPAGLIFLOZIN PROPANEDIOL 10 MG PO TABS: 10.0000 mg | ORAL_TABLET | Freq: Every day | ORAL | 3 refills | Status: DC

## 2022-02-20 MED ORDER — DAPAGLIFLOZIN PROPANEDIOL 10 MG PO TABS: 10.0000 mg | ORAL_TABLET | Freq: Every day | ORAL | 0 refills | Status: DC

## 2022-02-20 NOTE — Progress Notes (Addendum)
Chronic Care Management Pharmacy Assistant   Name: SUNG RENTON  MRN: [id number] DOB: 04-30-1946  Reason for Encounter: Medication Review/Patient assistance application for Farxiga.    Recent office visits:  02/20/2022 Dr. Ancil Boozer MD (PCP) Increase Ozempic to 2 mg weekly  Recent consult visits:  None ID   Hospital visits:  None in previous 6 months  Medications: Outpatient Encounter Medications as of 02/20/2022  Medication Sig   ACCU-CHEK SOFTCLIX LANCETS lancets 1 each by Other route daily.   acetaminophen (TYLENOL) 650 MG CR tablet Take 650 mg by mouth every 8 (eight) hours as needed.   Alcohol Swabs (B-D SINGLE USE SWABS REGULAR) PADS 1 each by Does not apply route daily.   aspirin EC 81 MG tablet Take 1 tablet (81 mg total) by mouth daily.   atorvastatin (LIPITOR) 40 MG tablet TAKE 1 TABLET 2 TIMES A WEEK.   calcium citrate (CALCITRATE - DOSED IN MG ELEMENTAL CALCIUM) 950 MG tablet Take 200 mg of elemental calcium by mouth daily. Has D3 in it as well   Cholecalciferol (VITAMIN D) 125 MCG (5000 UT) CAPS Take by mouth. Pt taking 3 times per week   Coenzyme Q10 (COQ10) 100 MG CAPS Take 1 capsule by mouth daily.   glucose blood (ACCU-CHEK AVIVA PLUS) test strip TEST BLOOD SUGAR UP TO FOUR TIMES DAILY AS DIRECTED   insulin degludec (TRESIBA FLEXTOUCH) 200 UNIT/ML FlexTouch Pen Inject 28 Units into the skin daily. Patient receives through [employer] Patient Assistance through Dec 2022   Insulin Pen Needle (NOVOFINE) 30G X 8 MM MISC Inject 10 each into the skin as needed.   lisinopril (ZESTRIL) 10 MG tablet TAKE 1 TABLET EVERY DAY   metFORMIN (GLUCOPHAGE-XR) 750 MG 24 hr tablet Take 1 tablet (750 mg total) by mouth daily with breakfast.   Semaglutide, 1 MG/DOSE, 4 MG/3ML SOPN Inject 1 mg as directed once a week. Patient receives through [employer] Patient Assistance through Dec 2022   VITAMIN B COMPLEX-C PO Take by mouth.   No facility-administered encounter medications on file  as of 02/20/2022.    Care Gaps: Shingrix vaccine COVID 19 Vaccine (4- Booster for [employer]) Ophthalmology Exam (Last Completed 04/01/2018) Influenza Vaccine   Star Rating Drugs: Atorvastatin 40 mg last filled 01/17/2022 for 84 day supply at [facility] Lisinopril 10 mg last filled 01/29/2022 for 90 day at [facility] Metformin 750 mg last filled 12/06/2021 for 90 days supply at [facility] 2 mg  (Receives  from [employer]) [name] 28 units daily (Receives from [employer])   Medication Fill Gaps: None ID  I received a task from [employer], CPP requesting that I start the application for patient assistance on the medication Farxiga and to complete a reorder form for Ozempic with correct dose.    Spoke with the patient and she gave verbal consent to complete the online application for [place] via phone.Patient was approved with a start date of 02/20/2022 and with a end date of 07/07/2022. Notified Clinical pharmacist to send the prescription to [facility] as I will check in one week to make sure they have received it and submitted it.  Patient was provided with 30 day voucher that was sent to [facility].  BIN#: [id number] PCN# [id number] GRP# [id number] ID# [id number]  Per Clinical pharmacist,  Please inform her that she can stop Metformin and start taking Farxiga in its place.   I completed the reorder form for Ozempic with the updated dose  and direction of 2 mg weekly .Form was sent to clinical pharmacist for review and to fax it to [employer].  [place] Pharmacist Assistant [phone]

## 2022-02-20 NOTE — Addendum Note (Signed)
Addended by: Daron Offer A on: 02/20/2022 10:56 AM   Modules accepted: Orders

## 2022-02-21 ENCOUNTER — Telehealth: Payer: Self-pay

## 2022-02-21 NOTE — Progress Notes (Addendum)
Patient states she went to pick up her [employer] [place] and [place] inform her it will be $95.00.  I reach out to [employer], and spoke with a representative and gave her the voucher information.  BIN#: [id number] PCN# [id number] GRP# [id number] ID# [id number]  [place] representative states she is unsure what happen when the patient was here, but it went through.[name] will be free for 30 day supply.  Patient is aware and states she will pick it up today.  [place] Pharmacist Assistant [phone]

## 2022-03-04 ENCOUNTER — Telehealth: Payer: Self-pay

## 2022-03-04 NOTE — Progress Notes (Addendum)
I reach out to [facility] to confirm they have received and process patient prescription for Farxiga.  Per [facility], patient shipment was already shipped on 02/27/2022.  I reach out to [employer] to Confirm they have received the updated reorder form for Ozempic which was to increase it to 2 mg weekly.  Per [employer], they did received the the reorder form and prescription to increase Ozempic to 2 mg weekly. Patient should received shipment in 10-14 business days at her PCP Office.Patient will receive text message with the update when the shipment is deliver.Patient end date is 07/07/2022.   [place] Pharmacist Assistant [phone]

## 2022-03-12 ENCOUNTER — Ambulatory Visit (INDEPENDENT_AMBULATORY_CARE_PROVIDER_SITE_OTHER): Payer: Medicare HMO | Admitting: Family Medicine

## 2022-03-12 ENCOUNTER — Ambulatory Visit: Payer: Self-pay | Admitting: *Deleted

## 2022-03-12 ENCOUNTER — Encounter: Payer: Self-pay | Admitting: Family Medicine

## 2022-03-12 ENCOUNTER — Other Ambulatory Visit (HOSPITAL_COMMUNITY)
Admission: RE | Admit: 2022-03-12 | Discharge: 2022-03-12 | Disposition: A | Discharge status: Home / Self Care | Payer: Medicare HMO | Source: Ambulatory Visit | Attending: Family Medicine | Admitting: Family Medicine

## 2022-03-12 VITALS — BP 122/72 | HR 97 | Temp 97.9°F | Resp 16 | Ht 64.0 in | Wt 186.2 lb

## 2022-03-12 DIAGNOSIS — N898 Other specified noninflammatory disorders of vagina: Secondary | ICD-10-CM | POA: Diagnosis not present

## 2022-03-12 DIAGNOSIS — E1121 Type 2 diabetes mellitus with diabetic nephropathy: Secondary | ICD-10-CM | POA: Diagnosis not present

## 2022-03-12 DIAGNOSIS — B3731 Acute candidiasis of vulva and vagina: Secondary | ICD-10-CM | POA: Diagnosis not present

## 2022-03-12 MED ORDER — METFORMIN HCL ER 750 MG PO TB24: 750.0000 mg | ORAL_TABLET | Freq: Every day | ORAL | 0 refills | Status: DC

## 2022-03-12 MED ORDER — FLUCONAZOLE 150 MG PO TABS: 150.0000 mg | ORAL_TABLET | ORAL | 0 refills | Status: DC

## 2022-03-12 MED ORDER — FLUCONAZOLE 150 MG PO TABS
150.0000 mg | ORAL_TABLET | ORAL | 0 refills | Status: DC
Start: 2022-03-12 — End: 2022-03-12

## 2022-03-12 NOTE — Telephone Encounter (Signed)
Summary: medication reaction   Pt states she has a possible yeast infection due to [place]   Pt states she has tried otc products w/ no releif   Pt states she has stop taken farxiga   Please fu w/ pt      Chief Complaint: vaginal itching Symptoms: yeast infection Frequency: since taking Farxiga Pertinent Negatives: Patient denies fever Disposition: '[]'$ ED /'[]'$ Urgent Care (no appt availability in office) / '[x]'$ Appointment(In office/virtual)/ '[]'$  [place] [facility]/ '[]'$ Home Care/ '[]'$ Refused Recommended Disposition /'[]'$ [place] [facility]/ '[]'$  Follow-up with PCP Additional Notes: Pt stopped Farxiga 3 days ago due to yeast infection. Had a rx  for  a pill for yeast infection but it expired. Made an appt due to pt stopped taking [place]ade so I was not sure if that is safe with other diagnosis. Appt this afternoon.   Reason for Disposition  [1] Symptoms of a yeast infection (i.e., itchy, white discharge, not bad smelling) AND [2] not improved > 3 days following Care Advice  Answer Assessment - Initial Assessment Questions 1. SYMPTOM: "What's the main symptom you're concerned about?" (e.g., pain, itching, dryness)     Itching and discharge 2. LOCATION: "Where is the  itch located?" (e.g., inside/outside, left/right)     vaginal 3. ONSET: "When did the yeast infection  start?"     A few days ago and stopped [place] 4. PAIN: "Is there any pain?" If Yes, ask: "How bad is it?" (Scale: 1-10; mild, moderate, severe)   -  MILD (1-3): Doesn't interfere with normal activities.    -  MODERATE (4-7): Interferes with normal activities (e.g., work or school) or awakens from sleep.     -  SEVERE (8-10): Excruciating pain, unable to do any normal activities.     itching 5. ITCHING: "Is there any itching?" If Yes, ask: "How bad is it?" (Scale: 1-10; mild, moderate, severe)     yes 6. CAUSE: "What do you think is causing the discharge?" "Have you had the same problem before? What happened then?"      2019 7. OTHER SYMPTOMS: "Do you have any other symptoms?" (e.g., fever, itching, vaginal bleeding, pain with urination, injury to genital area, vaginal foreign body)     No, burns a little with urination due to yeast infection. 8. PREGNANCY: "Is there any chance you are pregnant?" "When was your last menstrual period?"     no  Protocols used: Vaginal Symptoms-A-AH

## 2022-03-12 NOTE — Progress Notes (Signed)
Name: Christine Roberts   MRN: [id number]    DOB: [birth year]   Date:03/12/2022       Progress Note  Subjective  Chief Complaint  Possible Yeast Infection  HPI  Diabetes type 2: she is compliant with her medication, recently started [place] and stopped Metformin , however she developed yeast infection and she stopped taking it 3 days ago due to the symptoms. She did not go back on Metformin. She has noticed vaginal pruritis . She has tried hydrogen peroxide, followed by hydrocortisone, it is now burning at night . She has some discharge.   Patient Active Problem List   Diagnosis Date Noted   Atherosclerosis of aorta ([place]) 05/25/2018   Seasonal allergic rhinitis 09/12/2015   TIA (transient ischemic attack) 05/24/2015   Senile purpura ([place]) 05/09/2015   Benign essential HTN 03/18/2015   Pain in shoulder 03/18/2015   Dyslipidemia 03/18/2015   History of surgery to major organs, presenting hazards to health 03/18/2015   Hearing loss 03/18/2015   Hiatal hernia 03/18/2015   History of colon polyps 03/18/2015   Calculus of kidney 03/18/2015   B12 deficiency 03/18/2015   Microalbuminuria 03/18/2015   Adult BMI 30+ 03/18/2015   Arthritis, degenerative 03/18/2015   Diabetes mellitus with nephropathy ([place]) 03/18/2015    Past Surgical History:  Procedure Laterality Date   ABDOMINAL HYSTERECTOMY     CHOLECYSTECTOMY     COLONOSCOPY  2015   COLONOSCOPY WITH PROPOFOL N/A 03/04/2018   Procedure: COLONOSCOPY WITH PROPOFOL;  Surgeon: Toledo, Benay Pike, MD;  Location: [facility] ENDOSCOPY;  Service: Gastroenterology;  Laterality: N/A;   cystoscopic     stent placed in ureter   LITHOTRIPSY  12/2007   MYRINGOTOMY Right 11/2009    Family History  Problem Relation Age of Onset   Cancer Mother    Diabetes Mother    Diabetes Daughter    CAD Father    Diabetes Daughter        hypoglycemia    Social History   Tobacco Use   Smoking status: Never   Smokeless tobacco: Never   Tobacco comments:     smoking cessation materials not required  Substance Use Topics   Alcohol use: Yes    Alcohol/week: 1.0 standard drink of alcohol    Types: 1 Shots of liquor per week    Comment: socially     Current Outpatient Medications:    ACCU-CHEK SOFTCLIX LANCETS lancets, 1 each by Other route daily., Disp: 100 each, Rfl: 1   acetaminophen (TYLENOL) 650 MG CR tablet, Take 650 mg by mouth every 8 (eight) hours as needed., Disp: , Rfl:    Alcohol Swabs (B-D SINGLE USE SWABS REGULAR) PADS, 1 each by Does not apply route daily., Disp: 400 each, Rfl: 1   aspirin EC 81 MG tablet, Take 1 tablet (81 mg total) by mouth daily., Disp: 90 tablet, Rfl: 0   atorvastatin (LIPITOR) 40 MG tablet, TAKE 1 TABLET 2 TIMES A WEEK., Disp: 24 tablet, Rfl: 1   calcium citrate (CALCITRATE - DOSED IN MG ELEMENTAL CALCIUM) 950 MG tablet, Take 200 mg of elemental calcium by mouth daily. Has D3 in it as well, Disp: , Rfl:    Cholecalciferol (VITAMIN D) 125 MCG (5000 UT) CAPS, Take by mouth. Pt taking 3 times per week, Disp: , Rfl:    Coenzyme Q10 (COQ10) 100 MG CAPS, Take 1 capsule by mouth daily., Disp: , Rfl:    glucose blood (ACCU-CHEK AVIVA PLUS) test strip, TEST BLOOD SUGAR UP  TO FOUR TIMES DAILY AS DIRECTED, Disp: 400 each, Rfl: 0   insulin degludec (TRESIBA FLEXTOUCH) 200 UNIT/ML FlexTouch Pen, Inject 28 Units into the skin daily. Patient receives through [employer] Patient Assistance through Dec 2022, Disp: , Rfl:    Insulin Pen Needle (NOVOFINE) 30G X 8 MM MISC, Inject 10 each into the skin as needed., Disp: 100 each, Rfl: 2   lisinopril (ZESTRIL) 10 MG tablet, TAKE 1 TABLET EVERY DAY, Disp: 90 tablet, Rfl: 1   metFORMIN (GLUCOPHAGE-XR) 750 MG 24 hr tablet, Take 1 tablet (750 mg total) by mouth daily with breakfast., Disp: 90 tablet, Rfl: 0   Semaglutide, 1 MG/DOSE, 4 MG/3ML SOPN, Inject 1 mg as directed once a week. Patient receives through [employer] Patient Assistance through Dec 2022, Disp: , Rfl:    VITAMIN B  COMPLEX-C PO, Take by mouth., Disp: , Rfl:    dapagliflozin propanediol (FARXIGA) 10 MG TABS tablet, Take 1 tablet (10 mg total) by mouth daily before breakfast. Patient receives via [employer] Patient Assistance. (Patient not taking: Reported on 03/12/2022), Disp: 90 tablet, Rfl: 3   fluconazole (DIFLUCAN) 150 MG tablet, Take 1 tablet (150 mg total) by mouth every other day. Prn yeast, 3 per episode, Disp: 12 tablet, Rfl: 0  Allergies  Allergen Reactions   Latex Rash   Erythromycin Other (See Comments)    Strawberry tongue    I personally reviewed active problem list, medication list, allergies, family history, social history, health maintenance with the patient/caregiver today.   ROS  Ten systems reviewed and is negative except as mentioned in HPI  Objective  Vitals:   03/12/22 1503  BP: 122/72  Pulse: 97  Resp: 16  Temp: 97.9 F (36.6 C)  TempSrc: Oral  SpO2: 100%  Weight: 186 lb 3.2 oz (84.5 kg)  Height: '5\' 4"'$  (1.626 m)    Body mass index is 31.96 kg/m.  Physical Exam  Constitutional: Patient appears well-developed and well-nourished. Obese  No distress.  HEENT: head atraumatic, normocephalic, pupils equal and reactive to light, ears , neck supple, throat within normal limits Cardiovascular: Normal rate, regular rhythm and normal heart sounds.  No murmur heard. No BLE edema. Pulmonary/Chest: Effort normal and breath sounds normal. No respiratory distress. Abdominal: Soft.  There is no tenderness. Psychiatric: Patient has a normal mood and affect. behavior is normal. Judgment and thought content normal.    PHQ2/9:    03/12/2022    3:06 PM 02/19/2022    9:58 AM 10/22/2021   10:14 AM 08/23/2021    2:52 PM 06/15/2021   11:09 AM  Depression screen PHQ 2/9  Decreased Interest 0 0 0 0 0  Down, Depressed, Hopeless 0 0 0 1 0  PHQ - 2 Score 0 0 0 1 0  Altered sleeping 0 0 0 0 0  Tired, decreased energy 0 0 0 0 0  Change in appetite 1 3 0 0 0  Feeling bad or failure about  yourself  0 0 0 0 0  Trouble concentrating 0 0 0 0 0  Moving slowly or fidgety/restless 0 0 0 0 0  Suicidal thoughts 0 0 0 0 0  PHQ-9 Score 1 3 0 1 0  Difficult doing work/chores    Not difficult at all Not difficult at all    phq 9 is negative   Fall Risk:    03/12/2022    3:06 PM 02/19/2022    9:58 AM 10/22/2021   10:14 AM 08/23/2021    2:55  PM 06/15/2021   11:09 AM  Fall Risk   Falls in the past year? 0 0 0 0 0  Number falls in past yr:  0 0 0 0  Injury with Fall?  0 0 0 0  Risk for fall due to : No Fall Risks No Fall Risks No Fall Risks No Fall Risks No Fall Risks  Follow up Falls prevention discussed;Education provided;Falls evaluation completed Falls prevention discussed Falls prevention discussed Falls prevention discussed Falls prevention discussed      Functional Status Survey: Is the patient deaf or have difficulty hearing?: No Does the patient have difficulty seeing, even when wearing glasses/contacts?: No Does the patient have difficulty concentrating, remembering, or making decisions?: No Does the patient have difficulty walking or climbing stairs?: Yes Does the patient have difficulty dressing or bathing?: No Does the patient have difficulty doing errands alone such as visiting a doctor's office or shopping?: No    Assessment & Plan  1. Vaginal itching  - Cervicovaginal ancillary only - metFORMIN (GLUCOPHAGE-XR) 750 MG 24 hr tablet; Take 1 tablet (750 mg total) by mouth daily with breakfast.  Dispense: 90 tablet; Refill: 0  2. Vaginal yeast infection  - metFORMIN (GLUCOPHAGE-XR) 750 MG 24 hr tablet; Take 1 tablet (750 mg total) by mouth daily with breakfast.  Dispense: 90 tablet; Refill: 0 - fluconazole (DIFLUCAN) 150 MG tablet; Take 1 tablet (150 mg total) by mouth every other day. Prn yeast, 3 per episode  Dispense: 12 tablet; Refill: 0  3. Diabetes mellitus with nephropathy ([place])  Try to continue Farxiga, we will resume Metformin to help with  post-prandial levels, if fasting glucose drops below 100, go down by 2 units every 3 days of Tresiba

## 2022-03-14 LAB — CERVICOVAGINAL ANCILLARY ONLY
Bacterial Vaginitis (gardnerella): NEGATIVE
Candida Glabrata: NEGATIVE
Candida Vaginitis: POSITIVE — AB
Comment: NEGATIVE
Comment: NEGATIVE
Comment: NEGATIVE

## 2022-04-11 ENCOUNTER — Ambulatory Visit: Payer: Medicare HMO | Admitting: Dermatology

## 2022-04-15 ENCOUNTER — Telehealth: Payer: Self-pay

## 2022-04-15 NOTE — Progress Notes (Signed)
Chronic Care Management Pharmacy Assistant   Name: Christine Roberts  MRN: [id number] DOB: [birth year]   Reason for Encounter: Diabetes Disease State and Medication Review/Patient assistance renewal Ozempic and [place].    Recent office visits:  03/12/2022 Dr. Ancil Boozer MD (PCP) Start Fluconazole 150 mg,if fasting glucose drops below 100, go down by 2 units every 3 days of Tresiba   Recent consult visits:  None ID  Hospital visits:  None in previous 6 months  Medications: Outpatient Encounter Medications as of 04/15/2022  Medication Sig   ACCU-CHEK SOFTCLIX LANCETS lancets 1 each by Other route daily.   acetaminophen (TYLENOL) 650 MG CR tablet Take 650 mg by mouth every 8 (eight) hours as needed.   Alcohol Swabs (B-D SINGLE USE SWABS REGULAR) PADS 1 each by Does not apply route daily.   aspirin EC 81 MG tablet Take 1 tablet (81 mg total) by mouth daily.   atorvastatin (LIPITOR) 40 MG tablet TAKE 1 TABLET 2 TIMES A WEEK.   calcium citrate (CALCITRATE - DOSED IN MG ELEMENTAL CALCIUM) 950 MG tablet Take 200 mg of elemental calcium by mouth daily. Has D3 in it as well   Cholecalciferol (VITAMIN D) 125 MCG (5000 UT) CAPS Take by mouth. Pt taking 3 times per week   Coenzyme Q10 (COQ10) 100 MG CAPS Take 1 capsule by mouth daily.   dapagliflozin propanediol (FARXIGA) 10 MG TABS tablet Take 1 tablet (10 mg total) by mouth daily before breakfast. Patient receives via [employer] Patient Assistance. (Patient not taking: Reported on 03/12/2022)   fluconazole (DIFLUCAN) 150 MG tablet Take 1 tablet (150 mg total) by mouth every other day. Prn yeast, 3 per episode   glucose blood (ACCU-CHEK AVIVA PLUS) test strip TEST BLOOD SUGAR UP TO FOUR TIMES DAILY AS DIRECTED   insulin degludec (TRESIBA FLEXTOUCH) 200 UNIT/ML FlexTouch Pen Inject 28 Units into the skin daily. Patient receives through [employer] Patient Assistance through Dec 2022   Insulin Pen Needle (NOVOFINE) 30G X 8 MM MISC Inject 10 each into the skin  as needed.   lisinopril (ZESTRIL) 10 MG tablet TAKE 1 TABLET EVERY DAY   metFORMIN (GLUCOPHAGE-XR) 750 MG 24 hr tablet Take 1 tablet (750 mg total) by mouth daily with breakfast.   Semaglutide, 1 MG/DOSE, 4 MG/3ML SOPN Inject 1 mg as directed once a week. Patient receives through [employer] Patient Assistance through Dec 2022   VITAMIN B COMPLEX-C PO Take by mouth.   No facility-administered encounter medications on file as of 04/15/2022.    Care Gaps: COVID 19 Vaccine (4- Booster for [employer]) Ophthalmology Exam (Last Completed 04/01/2018) Influenza Vaccine   Star Rating Drugs: Atorvastatin 40 mg last filled 03/30/2022 for 84 day supply at [facility] Lisinopril 10 mg last filled 01/29/2022 for 90 day at [facility] Metformin 750 mg last filled 02/19/2022 for 90 days supply at [facility] 2 mg  (Receives  from [employer]) [name] 28 units daily (Receives from [employer]) Wilder Glade 10 mg (Receives from [employer])   Medication Fill Gaps: None ID  Recent Relevant Labs: Lab Results  Component Value Date/Time   HGBA1C 7.3 (A) 02/19/2022 09:58 AM   HGBA1C 7.0 (H) 10/22/2021 10:58 AM   HGBA1C 6.8 (A) 06/15/2021 11:22 AM   HGBA1C 7.7 (H) 11/23/2018 08:05 AM   HGBA1C 9.1 (A) 07/21/2018 11:48 AM   HGBA1C 8.1 (A) 03/30/2018 04:05 PM   MICROALBUR 0.8 02/19/2022 10:44 AM   MICROALBUR 0.3 02/12/2021 02:34 PM  MICROALBUR 20 01/10/2017 01:39 PM   MICROALBUR 20 09/12/2015 03:42 PM    Kidney Function Lab Results  Component Value Date/Time   CREATININE 0.87 02/19/2022 10:44 AM   CREATININE 0.77 06/21/2021 12:08 PM   CREATININE 0.78 02/12/2021 02:34 PM   GFRNONAA 59 (L) 02/09/2020 11:17 AM   GFRAA 69 02/09/2020 11:17 AM    Current antihyperglycemic regimen:  Metformin XR 750 mg daily Ozempic 2 mg weekly Tresiba 16 units daily (0.31 u/kg) Farxgia 10 mg daily  What recent interventions/DTPs have been made to improve glycemic control:  Patient reports she  delevlop a yeast infection due to the [place].Patient states her PCP inform her to continue to take [place] as her symptoms will improved.  Patient reports she take 2 mg of ozempic weekly, and 16 units daily of [place].  Have there been any recent hospitalizations or ED visits since last visit with CPP? No  Patient denies hypoglycemic symptoms, including Pale, Sweaty, Shaky, Hungry, Nervous/irritable, and Vision changes  Patient denies hyperglycemic symptoms, including blurry vision, excessive thirst, fatigue, polyuria, and weakness  How often are you checking your blood sugar? once daily  What are your blood sugars ranging?  Fasting: 116,112,97,138,96 Before meals: None ID After meals: None ID Bedtime: None ID  During the week, how often does your blood glucose drop below 70? Never  Are you checking your feet daily/regularly?   Yes, patient denies any pain, numbness or tingling sensation in her feet.  Adherence Review: Is the patient currently on a STATIN medication? Yes Is the patient currently on ACE/ARB medication? Yes Does the patient have >5 day gap between last estimated fill dates? No   Patient assistance renewal for Ozempic and Tresiba:  I received a task from [name], CPP requesting that I start the renewal application for patient assistance on the medication Ozempic and Tresiba to continue assistance through year 2024.    Spoke with the patient and inform him/her that the application will be mailed.I informed  her/him once she/he receives the application she/he will need to complete her/his part of the application and return it to her/his PCP office for [name], CPP to fax over to [employer] for processing.Informed patient to include a copy of her proof of income.  Patient verbalized understanding and was provided my phone number of [phone] if he has any questions.   Application emailed to [name], CPP for review and to mail to patient home.  [facility] Pharmacist Assistant [phone]

## 2022-04-16 ENCOUNTER — Ambulatory Visit: Payer: Medicare HMO | Admitting: Dermatology

## 2022-04-16 DIAGNOSIS — L570 Actinic keratosis: Secondary | ICD-10-CM

## 2022-04-16 DIAGNOSIS — L820 Inflamed seborrheic keratosis: Secondary | ICD-10-CM

## 2022-04-16 DIAGNOSIS — L578 Other skin changes due to chronic exposure to nonionizing radiation: Secondary | ICD-10-CM

## 2022-04-16 DIAGNOSIS — D1801 Hemangioma of skin and subcutaneous tissue: Secondary | ICD-10-CM

## 2022-04-16 DIAGNOSIS — L821 Other seborrheic keratosis: Secondary | ICD-10-CM

## 2022-04-16 DIAGNOSIS — L719 Rosacea, unspecified: Secondary | ICD-10-CM

## 2022-04-16 DIAGNOSIS — D2239 Melanocytic nevi of other parts of face: Secondary | ICD-10-CM

## 2022-04-16 DIAGNOSIS — Z85828 Personal history of other malignant neoplasm of skin: Secondary | ICD-10-CM

## 2022-04-16 DIAGNOSIS — C44722 Squamous cell carcinoma of skin of right lower limb, including hip: Secondary | ICD-10-CM | POA: Diagnosis not present

## 2022-04-16 DIAGNOSIS — L720 Epidermal cyst: Secondary | ICD-10-CM | POA: Diagnosis not present

## 2022-04-16 DIAGNOSIS — D239 Other benign neoplasm of skin, unspecified: Secondary | ICD-10-CM

## 2022-04-16 DIAGNOSIS — Z1283 Encounter for screening for malignant neoplasm of skin: Secondary | ICD-10-CM

## 2022-04-16 DIAGNOSIS — D2361 Other benign neoplasm of skin of right upper limb, including shoulder: Secondary | ICD-10-CM

## 2022-04-16 DIAGNOSIS — L814 Other melanin hyperpigmentation: Secondary | ICD-10-CM

## 2022-04-16 DIAGNOSIS — D692 Other nonthrombocytopenic purpura: Secondary | ICD-10-CM | POA: Diagnosis not present

## 2022-04-16 DIAGNOSIS — D489 Neoplasm of uncertain behavior, unspecified: Secondary | ICD-10-CM

## 2022-04-16 MED ORDER — METRONIDAZOLE 0.75 % EX CREA: TOPICAL_CREAM | CUTANEOUS | 5 refills | Status: AC

## 2022-04-16 MED ORDER — MUPIROCIN 2 % EX OINT: 1.0000 | TOPICAL_OINTMENT | Freq: Every day | CUTANEOUS | 0 refills | Status: DC

## 2022-04-16 NOTE — Progress Notes (Signed)
Follow-Up Visit   Subjective  Christine Roberts is a 76 y.o. female who presents for the following: Annual Exam (Tbse, hx of scc at right calf, hx if isks. Reports itchy spot at left side near bra line and crusty spot at right eyebrow that she would like checked. ). Spot at eyebrow came up quickly and has grown.  The patient presents for Total-Body Skin Exam (TBSE) for skin cancer screening and mole check.  The patient has spots, moles and lesions to be evaluated, some may be new or changing and the patient has concerns that these could be cancer.  The following portions of the chart were reviewed this encounter and updated as appropriate:      Review of Systems: No other skin or systemic complaints except as noted in HPI or Assessment and Plan.   Objective  Well appearing patient in no apparent distress; mood and affect are within normal limits.  A full examination was performed including scalp, head, eyes, ears, nose, lips, neck, chest, axillae, abdomen, back, buttocks, bilateral upper extremities, bilateral lower extremities, hands, feet, fingers, toes, fingernails, and toenails. All findings within normal limits unless otherwise noted below.  spinal mid upper back x 1, left flank at braline x 1 (2) Erythematous stuck-on, waxy papule   right hand dorsum 4 mm dark blue gray flat papule   Head - Anterior (Face) Redness and bumps at nose   left temple x 1, left malar cheek x 1 (2) Erythematous thin papules/macules with gritty scale.   chin Smooth white papule(s).   right lateral eyebrow 8 mm flesh papule with emerging hair      right lower nasal dorsum 2.5 mm firm pink papule    Assessment & Plan  SCC (squamous cell carcinoma), leg, right  Related Medications mupirocin ointment (BACTROBAN) 2 % Apply 1 Application topically daily. Apply to wound at right lower leg and cover until healed.  Inflamed seborrheic keratosis (2) spinal mid upper back x 1, left flank at  braline x 1  Symptomatic, irritating, patient would like treated.  Destruction of lesion - spinal mid upper back x 1, left flank at braline x 1  Destruction method: cryotherapy   Informed consent: discussed and consent obtained   Timeout:  patient name, date of birth, surgical site, and procedure verified Lesion destroyed using liquid nitrogen: Yes   Region frozen until ice ball extended beyond lesion: Yes   Outcome: patient tolerated procedure well with no complications   Post-procedure details: wound care instructions given   Additional details:  Prior to procedure, discussed risks of blister formation, small wound, skin dyspigmentation, or rare scar following cryotherapy. Recommend Vaseline ointment to treated areas while healing.   Blue nevus right hand dorsum  Vs Sk  Benign-appearing.  Observation.  Call clinic for new or changing lesions.  Recommend daily use of broad spectrum spf 30+ sunscreen to sun-exposed areas.    Rosacea Head - Anterior (Face)  Chronic and persistent condition with duration or expected duration over one year. Condition is bothersome/symptomatic for patient. Currently flared.   Rosacea is a chronic progressive skin condition usually affecting the face of adults, causing redness and/or acne bumps. It is treatable but not curable. It sometimes affects the eyes (ocular rosacea) as well. It may respond to topical and/or systemic medication and can flare with stress, sun exposure, alcohol, exercise, topical steroids (including hydrocortisone/cortisone 10) and some foods.  Daily application of broad spectrum spf 30+ sunscreen to face is recommended to  reduce flares.  Start metronidazole 0.75 % cream - apply qd/bid to mid face and nose for rosacea.   metroNIDAZOLE (METROCREAM) 0.75 % cream - Head - Anterior (Face) Apply qd/bid at mid face and nose for rosacea  Actinic keratosis (2) left temple x 1, left malar cheek x 1  Actinic keratoses are precancerous  spots that appear secondary to cumulative UV radiation exposure/sun exposure over time. They are chronic with expected duration over 1 year. A portion of actinic keratoses will progress to squamous cell carcinoma of the skin. It is not possible to reliably predict which spots will progress to skin cancer and so treatment is recommended to prevent development of skin cancer.  Recommend daily broad spectrum sunscreen SPF 30+ to sun-exposed areas, reapply every 2 hours as needed.  Recommend staying in the shade or wearing long sleeves, sun glasses (UVA+UVB protection) and wide brim hats (4-inch brim around the entire circumference of the hat). Call for new or changing lesions.  Destruction of lesion - left temple x 1, left malar cheek x 1  Destruction method: cryotherapy   Informed consent: discussed and consent obtained   Lesion destroyed using liquid nitrogen: Yes   Region frozen until ice ball extended beyond lesion: Yes   Outcome: patient tolerated procedure well with no complications   Post-procedure details: wound care instructions given   Additional details:  Prior to procedure, discussed risks of blister formation, small wound, skin dyspigmentation, or rare scar following cryotherapy. Recommend Vaseline ointment to treated areas while healing.   Milia chin  Benign, observe.    Start OTC Effaclar gel- apply topically to affected spots at face nightly, samples given  Neoplasm of uncertain behavior right lateral eyebrow  Epidermal / dermal shaving  Lesion diameter (cm):  0.8 Informed consent: discussed and consent obtained   Patient was prepped and draped in usual sterile fashion: Area prepped with alcohol. Anesthesia: the lesion was anesthetized in a standard fashion   Anesthetic:  1% lidocaine w/ epinephrine 1-100,000 buffered w/ 8.4% NaHCO3 Instrument used: flexible razor blade   Hemostasis achieved with: pressure, aluminum chloride and electrodesiccation   Outcome: patient  tolerated procedure well   Post-procedure details: wound care instructions given   Post-procedure details comment:  Ointment and small bandage applied.   Specimen 1 - Surgical pathology Differential Diagnosis: Irritated nevus vs cyst r/o bcc   Check Margins: No  Irritated nevus vs cyst r/o bcc   Fibrous papule of nose right lower nasal dorsum  Benign-appearing.  Observation.  Call clinic for new or changing lesions.  Recommend daily use of broad spectrum spf 30+ sunscreen to sun-exposed areas.     Lentigines - Scattered tan macules - Due to sun exposure - Benign-appearing, observe - Recommend daily broad spectrum sunscreen SPF 30+ to sun-exposed areas, reapply every 2 hours as needed. - Call for any changes  Seborrheic Keratoses - Stuck-on, waxy, tan-Staubs papules and/or plaques  - Benign-appearing - Discussed benign etiology and prognosis. - Observe - Call for any changes  Melanocytic Nevi - Tan-Bodie and/or pink-flesh-colored symmetric macules and papules - Benign appearing on exam today - Observation - Call clinic for new or changing moles - Recommend daily use of broad spectrum spf 30+ sunscreen to sun-exposed areas.   Purpura - Chronic; persistent and recurrent.  Treatable, but not curable. - Violaceous macules and patches - Benign - Related to trauma, age, sun damage and/or use of blood thinners, chronic use of topical and/or oral steroids - Observe - Can use  OTC arnica containing moisturizer such as Dermend Bruise Formula if desired - Call for worsening or other concerns  Hemangiomas - Red papules - Discussed benign nature - Observe - Call for any changes  Actinic Damage - Chronic condition, secondary to cumulative UV/sun exposure - diffuse scaly erythematous macules with underlying dyspigmentation - Recommend daily broad spectrum sunscreen SPF 30+ to sun-exposed areas, reapply every 2 hours as needed.  - Staying in the shade or wearing long sleeves, sun  glasses (UVA+UVB protection) and wide brim hats (4-inch brim around the entire circumference of the hat) are also recommended for sun protection.  - Call for new or changing lesions.  History of Squamous Cell Carcinoma of the Skin  Thick keratotic crust overlying ED&C site right distal medial calf Mupirocin ointment apply topically qd and cover with bandage until healed. - No evidence of recurrence today, but not completely healed, recheck on follow-up - Recommend regular full body skin exams - Recommend daily broad spectrum sunscreen SPF 30+ to sun-exposed areas, reapply every 2 hours as needed.  - Call if any new or changing lesions are noted between office visits  Skin cancer screening performed today. Return in about 3 months (around 07/17/2022) for SCC wound recheck at right calf , 1 year tbse . I, Ruthell Rummage, CMA, am acting as scribe for Brendolyn Patty, MD.  Documentation: I have reviewed the above documentation for accuracy and completeness, and I agree with the above.  Brendolyn Patty MD

## 2022-04-16 NOTE — Patient Instructions (Addendum)
Apply mupirocin ointment to scab at right lower leg daily and cover until healed.       Biopsy Wound Care Instructions  Leave the original bandage on for 24 hours if possible.  If the bandage becomes soaked or soiled before that time, it is OK to remove it and examine the wound.  A small amount of post-operative bleeding is normal.  If excessive bleeding occurs, remove the bandage, place gauze over the site and apply continuous pressure (no peeking) over the area for 30 minutes. If this does not work, please call our clinic as soon as possible or page your doctor if it is after hours.   Once a day, cleanse the wound with soap and water. It is fine to shower. If a thick crust develops you may use a Q-tip dipped into dilute hydrogen peroxide (mix 1:1 with water) to dissolve it.  Hydrogen peroxide can slow the healing process, so use it only as needed.    After washing, apply petroleum jelly (Vaseline) or an antibiotic ointment if your doctor prescribed one for you, followed by a bandage.    For best healing, the wound should be covered with a layer of ointment at all times. If you are not able to keep the area covered with a bandage to hold the ointment in place, this may mean re-applying the ointment several times a day.  Continue this wound care until the wound has healed and is no longer open.   Itching and mild discomfort is normal during the healing process. However, if you develop pain or severe itching, please call our office.   If you have any discomfort, you can take Tylenol (acetaminophen) or ibuprofen as directed on the bottle. (Please do not take these if you have an allergy to them or cannot take them for another reason).  Some redness, tenderness and white or yellow material in the wound is normal healing.  If the area becomes very sore and red, or develops a thick yellow-green material (pus), it may be infected; please notify us.    If you have stitches, return to clinic as  directed to have the stitches removed. You will continue wound care for 2-3 days after the stitches are removed.   Wound healing continues for up to one year following surgery. It is not unusual to experience pain in the scar from time to time during the interval.  If the pain becomes severe or the scar thickens, you should notify the office.    A slight amount of redness in a scar is expected for the first six months.  After six months, the redness will fade and the scar will soften and fade.  The color difference becomes less noticeable with time.  If there are any problems, return for a post-op surgery check at your earliest convenience.  To improve the appearance of the scar, you can use silicone scar gel, cream, or sheets (such as Mederma or Serica) every night for up to one year. These are available over the counter (without a prescription).  Please call our office at [phone] for any questions or concerns.        Actinic keratoses are precancerous spots that appear secondary to cumulative UV radiation exposure/sun exposure over time. They are chronic with expected duration over 1 year. A portion of actinic keratoses will progress to squamous cell carcinoma of the skin. It is not possible to reliably predict which spots will progress to skin cancer and so treatment is recommended  to prevent development of skin cancer.  Recommend daily broad spectrum sunscreen SPF 30+ to sun-exposed areas, reapply every 2 hours as needed.  Recommend staying in the shade or wearing long sleeves, sun glasses (UVA+UVB protection) and wide brim hats (4-inch brim around the entire circumference of the hat). Call for new or changing lesions.     Seborrheic Keratosis  What causes seborrheic keratoses? Seborrheic keratoses are harmless, common skin growths that first appear during adult life.  As time goes by, more growths appear.  Some people may develop a large number of them.  Seborrheic keratoses  appear on both covered and uncovered body parts.  They are not caused by sunlight.  The tendency to develop seborrheic keratoses can be inherited.  They vary in color from skin-colored to gray, [place], or even black.  They can be either smooth or have a rough, warty surface.   Seborrheic keratoses are superficial and look as if they were stuck on the skin.  Under the microscope this type of keratosis looks like layers upon layers of skin.  That is why at times the top layer may seem to fall off, but the rest of the growth remains and re-grows.    Treatment Seborrheic keratoses do not need to be treated, but can easily be removed in the office.  Seborrheic keratoses often cause symptoms when they rub on clothing or jewelry.  Lesions can be in the way of shaving.  If they become inflamed, they can cause itching, soreness, or burning.  Removal of a seborrheic keratosis can be accomplished by freezing, burning, or surgery. If any spot bleeds, scabs, or grows rapidly, please return to have it checked, as these can be an indication of a skin cancer.   Cryotherapy Aftercare  Wash gently with soap and water everyday.   Apply Vaseline and Band-Aid daily until healed.      Melanoma ABCDEs  Melanoma is the most dangerous type of skin cancer, and is the leading cause of death from skin disease.  You are more likely to develop melanoma if you: Have light-colored skin, light-colored eyes, or red or blond hair Spend a lot of time in the sun Tan regularly, either outdoors or in a tanning bed Have had blistering sunburns, especially during childhood Have a close family member who has had a melanoma Have atypical moles or large birthmarks  Early detection of melanoma is key since treatment is typically straightforward and cure rates are extremely high if we catch it early.   The first sign of melanoma is often a change in a mole or a new dark spot.  The ABCDE system is a way of remembering the signs of  melanoma.  A for asymmetry:  The two halves do not match. B for border:  The edges of the growth are irregular. C for color:  A mixture of colors are present instead of an even Heo color. D for diameter:  Melanomas are usually (but not always) greater than 62m - the size of a pencil eraser. E for evolution:  The spot keeps changing in size, shape, and color.  Please check your skin once per month between visits. You can use a small mirror in front and a large mirror behind you to keep an eye on the back side or your body.   If you see any new or changing lesions before your next follow-up, please call to schedule a visit.  Please continue daily skin protection including broad spectrum sunscreen SPF 30+  to sun-exposed areas, reapplying every 2 hours as needed when you're outdoors.   Staying in the shade or wearing long sleeves, sun glasses (UVA+UVB protection) and wide brim hats (4-inch brim around the entire circumference of the hat) are also recommended for sun protection.    Due to recent changes in healthcare laws, you may see results of your pathology and/or laboratory studies on MyChart before the doctors have had a chance to review them. We understand that in some cases there may be results that are confusing or concerning to you. Please understand that not all results are received at the same time and often the doctors may need to interpret multiple results in order to provide you with the best plan of care or course of treatment. Therefore, we ask that you please give [place] 2 business days to thoroughly review all your results before contacting the office for clarification. Should we see a critical lab result, you will be contacted sooner.   If You Need Anything After Your Visit  If you have any questions or concerns for your doctor, please call our main line at [phone] and press option 4 to reach your doctor's medical assistant. If no one answers, please leave a voicemail as  directed and we will return your call as soon as possible. Messages left after 4 pm will be answered the following business day.   You may also send [place] a message via [place]. We typically respond to MyChart messages within 1-2 business days.  For prescription refills, please ask your pharmacy to contact our office. Our fax number is [phone].  If you have an urgent issue when the clinic is closed that cannot wait until the next business day, you can page your doctor at the number below.    Please note that while we do our best to be available for urgent issues outside of office hours, we are not available 24/7.   If you have an urgent issue and are unable to reach [place], you may choose to seek medical care at your doctor's office, retail clinic, urgent care center, or emergency room.  If you have a medical emergency, please immediately call 911 or go to the emergency department.  Pager Numbers  - Dr. Nehemiah Massed: [phone]  - Dr. Laurence Ferrari: [phone]  - Dr. Nicole Kindred: [phone]  In the event of inclement weather, please call our main line at [phone] for an update on the status of any delays or closures.  Dermatology Medication Tips: Please keep the boxes that topical medications come in in order to help keep track of the instructions about where and how to use these. Pharmacies typically print the medication instructions only on the boxes and not directly on the medication tubes.   If your medication is too expensive, please contact our office at [phone] option 4 or send [place] a message through [place].   We are unable to tell what your co-pay for medications will be in advance as this is different depending on your insurance coverage. However, we may be able to find a substitute medication at lower cost or fill out paperwork to get insurance to cover a needed medication.   If a prior authorization is required to get your medication covered by your insurance company, please  allow [place] 1-2 business days to complete this process.  Drug prices often vary depending on where the prescription is filled and some pharmacies may offer cheaper prices.  The website www.goodrx.com contains coupons for medications through different pharmacies. The  prices here do not account for what the cost may be with help from insurance (it may be cheaper with your insurance), but the website can give you the price if you did not use any insurance.  - You can print the associated coupon and take it with your prescription to the pharmacy.  - You may also stop by our office during regular business hours and pick up a GoodRx coupon card.  - If you need your prescription sent electronically to a different pharmacy, notify our office through [facility] or by phone at [phone] option 4.     Si Usted Necesita Algo Despus de Su Visita  Tambin puede enviarnos un mensaje a travs de Pharmacist, community. Por lo general respondemos a los mensajes de MyChart en el transcurso de 1 a 2 das hbiles.  Para renovar recetas, por favor pida a su farmacia que se ponga en contacto con nuestra oficina. Harland Dingwall de fax es [place] [phone].  Si tiene un asunto urgente cuando la clnica est cerrada y que no puede esperar hasta el siguiente da hbil, puede llamar/localizar a su doctor(a) al nmero que aparece a continuacin.   Por favor, tenga en cuenta que aunque hacemos todo lo posible para estar disponibles para asuntos urgentes fuera del horario de [place], no estamos disponibles las 24 horas del da, los 7 das de la [place].   Si tiene un problema urgente y no puede comunicarse con nosotros, puede optar por buscar atencin mdica  en el consultorio de su doctor(a), en una clnica privada, en un centro de atencin urgente o en una sala de emergencias.  Si tiene Engineering geologist, por favor llame inmediatamente al 911 o vaya a la sala de emergencias.  Nmeros de bper  - Dr. Nehemiah Massed:  [phone]  - Dra. Moye: [phone]  - Dra. Nicole Kindred: [phone]  En caso de inclemencias del [place], por favor llame a Johnsie Kindred principal al [phone] para una actualizacin sobre el [place] de cualquier retraso o cierre.  Consejos para la medicacin en dermatologa: Por favor, guarde las cajas en las que vienen los medicamentos de uso tpico para ayudarle a seguir las instrucciones sobre dnde y cmo usarlos. Las farmacias generalmente imprimen las instrucciones del medicamento slo en las cajas y no directamente en los tubos del [place].   Si su medicamento es muy caro, por favor, pngase en contacto con Zigmund Daniel llamando al [phone] y presione la opcin 4 o envenos un mensaje a travs de Pharmacist, community.   No podemos decirle cul ser su copago por los medicamentos por adelantado ya que esto es diferente dependiendo de la cobertura de su seguro. Sin embargo, es posible que podamos encontrar un medicamento sustituto a Electrical engineer un formulario para que el seguro cubra el medicamento que se considera necesario.   Si se requiere una autorizacin previa para que su compaa de seguros [place] su medicamento, por favor permtanos de 1 a 2 das hbiles para completar este proceso.  Los precios de los medicamentos varan con frecuencia dependiendo del Environmental consultant de dnde se surte la receta y alguna farmacias pueden ofrecer precios ms baratos.  El sitio web www.goodrx.com tiene cupones para medicamentos de Airline pilot. Los precios aqu no tienen en cuenta lo que podra costar con la ayuda del seguro (puede ser ms barato con su seguro), pero el sitio web puede darle el precio si no utiliz Research scientist (physical sciences).  - Puede imprimir el cupn correspondiente y llevarlo con su receta a la  farmacia.  - Tambin puede pasar por nuestra oficina durante el horario de atencin regular y Charity fundraiser una tarjeta de cupones de GoodRx.  - Si necesita que su receta se enve electrnicamente a  una farmacia diferente, informe a nuestra oficina a travs de MyChart de [place] o por telfono llamando al [phone] y presione la opcin 4.

## 2022-04-17 ENCOUNTER — Encounter: Payer: Self-pay | Admitting: Nurse Practitioner

## 2022-04-17 ENCOUNTER — Other Ambulatory Visit: Payer: Self-pay

## 2022-04-17 ENCOUNTER — Telehealth (INDEPENDENT_AMBULATORY_CARE_PROVIDER_SITE_OTHER): Payer: Medicare HMO | Admitting: Nurse Practitioner

## 2022-04-17 VITALS — Temp 100.8°F

## 2022-04-17 DIAGNOSIS — U071 COVID-19: Secondary | ICD-10-CM | POA: Diagnosis not present

## 2022-04-17 MED ORDER — NIRMATRELVIR/RITONAVIR (PAXLOVID)TABLET: 3.0000 | ORAL_TABLET | Freq: Two times a day (BID) | ORAL | 0 refills | Status: DC

## 2022-04-17 NOTE — Progress Notes (Signed)
Name: Christine Roberts   MRN: [id number]    DOB: May 26, 1946   Date:04/17/2022       Progress Note  Subjective  Chief Complaint  Chief Complaint  Patient presents with   Covid Positive    Congested, sore throat, cough, headache for 2 days    I connected with  Christine Roberts  on 04/17/22 at 09:24 am by a video enabled telemedicine application and verified that I am speaking with the correct person using two identifiers.  I discussed the limitations of evaluation and management by telemedicine and the availability of in person appointments. The patient expressed understanding and agreed to proceed with a virtual visit  Staff also discussed with the patient that there may be a patient responsible charge related to this service. Patient Location: home Provider Location: [facility] Additional Individuals present: alone  HPI  Covid 19: Patient reports symptoms started yesterday.  Symptoms include sore throat, cough, nasal congestion, headache and fever. She denies any shortness of breath or chest pain.   She took a home covid test and it was positive.  She has taken ibuprofen for her symptoms.  Recommend she take zyrtec, flonase, mucinex, vitamin c, vitamin d and zinc.  Discussed antiviral medication for the treatment of covid. Discussed side effects.  Patient would like to take it.  Her last GFR was 69. Offered tessalon perls but patient states she has cough medication at home.    Patient Active Problem List   Diagnosis Date Noted   Atherosclerosis of aorta ([place]) 05/25/2018   Seasonal allergic rhinitis 09/12/2015   TIA (transient ischemic attack) 05/24/2015   Senile purpura ([place]) 05/09/2015   Benign essential HTN 03/18/2015   Pain in shoulder 03/18/2015   Dyslipidemia 03/18/2015   History of surgery to major organs, presenting hazards to health 03/18/2015   Hearing loss 03/18/2015   Hiatal hernia 03/18/2015   History of colon polyps 03/18/2015   Calculus of kidney 03/18/2015   B12 deficiency  03/18/2015   Microalbuminuria 03/18/2015   Adult BMI 30+ 03/18/2015   Arthritis, degenerative 03/18/2015   Diabetes mellitus with nephropathy ([place]) 03/18/2015    Social History   Tobacco Use   Smoking status: Never   Smokeless tobacco: Never   Tobacco comments:    smoking cessation materials not required  Substance Use Topics   Alcohol use: Yes    Alcohol/week: 1.0 standard drink of alcohol    Types: 1 Shots of liquor per week    Comment: socially     Current Outpatient Medications:    acetaminophen (TYLENOL) 650 MG CR tablet, Take 650 mg by mouth every 8 (eight) hours as needed., Disp: , Rfl:    aspirin EC 81 MG tablet, Take 1 tablet (81 mg total) by mouth daily., Disp: 90 tablet, Rfl: 0   atorvastatin (LIPITOR) 40 MG tablet, TAKE 1 TABLET 2 TIMES A WEEK., Disp: 24 tablet, Rfl: 1   calcium citrate (CALCITRATE - DOSED IN MG ELEMENTAL CALCIUM) 950 MG tablet, Take 200 mg of elemental calcium by mouth daily. Has D3 in it as well, Disp: , Rfl:    Cholecalciferol (VITAMIN D) 125 MCG (5000 UT) CAPS, Take by mouth. Pt taking 3 times per week, Disp: , Rfl:    Coenzyme Q10 (COQ10) 100 MG CAPS, Take 1 capsule by mouth daily., Disp: , Rfl:    dapagliflozin propanediol (FARXIGA) 10 MG TABS tablet, Take 1 tablet (10 mg total) by mouth daily before breakfast. Patient receives via [place] Patient Assistance.,  Disp: 90 tablet, Rfl: 3   insulin degludec (TRESIBA FLEXTOUCH) 200 UNIT/ML FlexTouch Pen, Inject 28 Units into the skin daily. Patient receives through [employer] Patient Assistance through Dec 2022, Disp: , Rfl:    lisinopril (ZESTRIL) 10 MG tablet, TAKE 1 TABLET EVERY DAY, Disp: 90 tablet, Rfl: 1   metFORMIN (GLUCOPHAGE-XR) 750 MG 24 hr tablet, Take 1 tablet (750 mg total) by mouth daily with breakfast., Disp: 90 tablet, Rfl: 0   metroNIDAZOLE (METROCREAM) 0.75 % cream, Apply qd/bid at mid face and nose for rosacea, Disp: 45 g, Rfl: 5   mupirocin ointment (BACTROBAN) 2 %, Apply 1  Application topically daily. Apply to wound at right lower leg and cover until healed., Disp: 22 g, Rfl: 0   Semaglutide, 1 MG/DOSE, 4 MG/3ML SOPN, Inject 1 mg as directed once a week. Patient receives through [employer] Patient Assistance through Dec 2022, Disp: , Rfl:    VITAMIN B COMPLEX-C PO, Take by mouth., Disp: , Rfl:    ACCU-CHEK SOFTCLIX LANCETS lancets, 1 each by Other route daily. (Patient not taking: Reported on 04/17/2022), Disp: 100 each, Rfl: 1   Alcohol Swabs (B-D SINGLE USE SWABS REGULAR) PADS, 1 each by Does not apply route daily. (Patient not taking: Reported on 04/17/2022), Disp: 400 each, Rfl: 1   fluconazole (DIFLUCAN) 150 MG tablet, Take 1 tablet (150 mg total) by mouth every other day. Prn yeast, 3 per episode (Patient not taking: Reported on 04/17/2022), Disp: 12 tablet, Rfl: 0   glucose blood (ACCU-CHEK AVIVA PLUS) test strip, TEST BLOOD SUGAR UP TO FOUR TIMES DAILY AS DIRECTED (Patient not taking: Reported on 04/17/2022), Disp: 400 each, Rfl: 0   Insulin Pen Needle (NOVOFINE) 30G X 8 MM MISC, Inject 10 each into the skin as needed. (Patient not taking: Reported on 04/17/2022), Disp: 100 each, Rfl: 2  Allergies  Allergen Reactions   Latex Rash   Erythromycin Other (See Comments)    Strawberry tongue    I personally reviewed active problem list, medication list, allergies with the patient/caregiver today.  ROS  Constitutional:positive for fever or weight change.  HEENT: positive for sore throat, nasal congestion Respiratory:positive for cough and negative for shortness of breath.   Cardiovascular: Negative for chest pain or palpitations.  Gastrointestinal: Negative for abdominal pain, no bowel changes.  Musculoskeletal: Negative for gait problem or joint swelling.  Skin: Negative for rash.  Neurological: Negative for dizziness, positive for headache.  No other specific complaints in a complete review of systems (except as listed in HPI above).    Objective  Virtual encounter, vitals not obtained.  There is no height or weight on file to calculate BMI.  Nursing Note and Vital Signs reviewed.  Physical Exam  Awake,alert and oriented x3, speaking in complete sentences  No results found for this or any previous visit (from the past 72 hour(s)).  Assessment & Plan  Problem List Items Addressed This Visit   None Visit Diagnoses     COVID-19    -  Primary   Push fluids get rest.  You can continue to take ibuprofen.  Add on Mucinex, Zyrtec, Flonase, vitamin D, vitamin C and zinc.  We will send in Paxlovid.   Relevant Medications   nirmatrelvir/ritonavir EUA (PAXLOVID) 20 x 150 MG & 10 x '100MG'$  TABS        -Red flags and when to present for emergency care or RTC including fever >101.27F, chest pain, shortness of breath, new/worsening/un-resolving symptoms,  reviewed with patient  at time of visit. Follow up and care instructions discussed and provided in AVS. - I discussed the assessment and treatment plan with the patient. The patient was provided an opportunity to ask questions and all were answered. The patient agreed with the plan and demonstrated an understanding of the instructions.  I provided 15 minutes of non-face-to-face time during this encounter.  Bo Merino, FNP

## 2022-04-18 ENCOUNTER — Emergency Department: Payer: Medicare HMO

## 2022-04-18 ENCOUNTER — Other Ambulatory Visit: Payer: Self-pay

## 2022-04-18 ENCOUNTER — Emergency Department
Admission: EM | Admit: 2022-04-18 | Discharge: 2022-04-18 | Disposition: A | Discharge status: Home / Self Care | Payer: Medicare HMO | Attending: Emergency Medicine | Admitting: Emergency Medicine

## 2022-04-18 DIAGNOSIS — J90 Pleural effusion, not elsewhere classified: Secondary | ICD-10-CM | POA: Diagnosis not present

## 2022-04-18 DIAGNOSIS — U071 COVID-19: Secondary | ICD-10-CM | POA: Insufficient documentation

## 2022-04-18 DIAGNOSIS — E119 Type 2 diabetes mellitus without complications: Secondary | ICD-10-CM | POA: Insufficient documentation

## 2022-04-18 DIAGNOSIS — R000 Tachycardia, unspecified: Secondary | ICD-10-CM | POA: Diagnosis not present

## 2022-04-18 DIAGNOSIS — E860 Dehydration: Secondary | ICD-10-CM | POA: Diagnosis not present

## 2022-04-18 DIAGNOSIS — R5383 Other fatigue: Secondary | ICD-10-CM | POA: Diagnosis present

## 2022-04-18 DIAGNOSIS — J9811 Atelectasis: Secondary | ICD-10-CM | POA: Diagnosis not present

## 2022-04-18 DIAGNOSIS — R911 Solitary pulmonary nodule: Secondary | ICD-10-CM | POA: Diagnosis not present

## 2022-04-18 DIAGNOSIS — R531 Weakness: Secondary | ICD-10-CM | POA: Diagnosis not present

## 2022-04-18 LAB — CBC WITH DIFFERENTIAL/PLATELET
Abs Immature Granulocytes: 0.05 10*3/uL (ref 0.00–0.07)
Basophils Absolute: 0.1 10*3/uL (ref 0.0–0.1)
Basophils Relative: 1 %
Eosinophils Absolute: 0 10*3/uL (ref 0.0–0.5)
Eosinophils Relative: 0 %
HCT: 42.8 % (ref 36.0–46.0)
Hemoglobin: 13.8 g/dL (ref 12.0–15.0)
Immature Granulocytes: 1 %
Lymphocytes Relative: 13 %
Lymphs Abs: 1.4 10*3/uL (ref 0.7–4.0)
MCH: 27.7 pg (ref 26.0–34.0)
MCHC: 32.2 g/dL (ref 30.0–36.0)
MCV: 85.8 fL (ref 80.0–100.0)
Monocytes Absolute: 1.5 10*3/uL — ABNORMAL HIGH (ref 0.1–1.0)
Monocytes Relative: 13 %
Neutro Abs: 7.9 10*3/uL — ABNORMAL HIGH (ref 1.7–7.7)
Neutrophils Relative %: 72 %
Platelets: 258 10*3/uL (ref 150–400)
RBC: 4.99 MIL/uL (ref 3.87–5.11)
RDW: 13.9 % (ref 11.5–15.5)
WBC: 11 10*3/uL — ABNORMAL HIGH (ref 4.0–10.5)
nRBC: 0 % (ref 0.0–0.2)

## 2022-04-18 LAB — COMPREHENSIVE METABOLIC PANEL
ALT: 22 U/L (ref 0–44)
AST: 22 U/L (ref 15–41)
Albumin: 3.9 g/dL (ref 3.5–5.0)
Alkaline Phosphatase: 58 U/L (ref 38–126)
Anion gap: 12 (ref 5–15)
BUN: 22 mg/dL (ref 8–23)
CO2: 22 mmol/L (ref 22–32)
Calcium: 9 mg/dL (ref 8.9–10.3)
Chloride: 102 mmol/L (ref 98–111)
Creatinine, Ser: 1.26 mg/dL — ABNORMAL HIGH (ref 0.44–1.00)
GFR, Estimated: 44 mL/min — ABNORMAL LOW (ref >60)
Glucose, Bld: 165 mg/dL — ABNORMAL HIGH (ref 70–99)
Potassium: 4.2 mmol/L (ref 3.5–5.1)
Sodium: 136 mmol/L (ref 135–145)
Total Bilirubin: 1 mg/dL (ref 0.3–1.2)
Total Protein: 7.5 g/dL (ref 6.5–8.1)

## 2022-04-18 LAB — CBG MONITORING, ED: Glucose-Capillary: 86 mg/dL (ref 70–99)

## 2022-04-18 LAB — PROCALCITONIN: Procalcitonin: <0.1 ng/mL

## 2022-04-18 LAB — SARS CORONAVIRUS 2 BY RT PCR: SARS Coronavirus 2 by RT PCR: POSITIVE — AB

## 2022-04-18 LAB — TROPONIN I (HIGH SENSITIVITY): Troponin I (High Sensitivity): 7 ng/L (ref <18)

## 2022-04-18 MED ORDER — IOHEXOL 350 MG/ML SOLN
75.0000 mL | Freq: Once | INTRAVENOUS | Status: AC | PRN
  Administered 2022-04-18: 75 mL via INTRAVENOUS

## 2022-04-18 MED ORDER — SODIUM CHLORIDE 0.9 % IV BOLUS
1000.0000 mL | Freq: Once | INTRAVENOUS | Status: AC
  Administered 2022-04-18: 1000 mL via INTRAVENOUS

## 2022-04-18 NOTE — ED Notes (Signed)
Patient ambulated in hallway with steady gait. Resp even, unlabored during walk. Oxygen saturation 98-100% on room air.

## 2022-04-18 NOTE — ED Triage Notes (Signed)
Pt coming from home with Covid+ starting Tuesday. Pt was rx paxlovid. Pt developed diarrhea, nausea, and dizziness.

## 2022-04-18 NOTE — ED Provider Notes (Signed)
[facility] Provider Note    Event Date/Time   First MD Initiated Contact with Patient 04/18/22 1714     (approximate)   History   Dizziness   HPI  Christine Roberts is a 76 y.o. female who on review of primary care note from yesterday was diagnosed with COVID-19.  She has a history of atherosclerosis TIA hearing loss B12 deficiency and diabetes mellitus   On Monday patient felt like she was a bit fatigued, then Tuesday started developing fevers sore throat and a dry cough.  Some fatigue.  Tested positive for coronavirus at home, called spoke with her doctor and started on Paxlovid yesterday.  She was doing relatively well but reports she started feeling having loose stools this morning and feeling lightheaded.  Nausea.  She feels like it could be from the medication she was experiencing her symptoms until she started Paxlovid  No chest pain.  No abdominal pain.  Reports seems like she just had a loose couple of watery stools  Has a cough mild sore throat.  No difficulty breathing.  Does not feel short of breath.  Does feel a bit lightheaded and dehydrated though.  She felt very lightheaded when she was using the toilet this morning and had a loose nonbloody stool.  Physical Exam   Triage Vital Signs: ED Triage Vitals  Enc Vitals Group     BP 04/18/22 1434 (!) 136/94     Pulse Rate 04/18/22 1434 (!) 110     Resp 04/18/22 1434 18     Temp 04/18/22 1434 98.1 F (36.7 C)     Temp src --      SpO2 04/18/22 1434 94 %     Weight 04/18/22 1435 175 lb (79.4 kg)     Height --      Head Circumference --      Peak Flow --      Pain Score 04/18/22 1435 8     Pain Loc --      Pain Edu? --      Excl. in [place]? --     Most recent vital signs: Vitals:   04/18/22 2100 04/18/22 2225  BP: 124/64 121/72  Pulse: 85 92  Resp: 16 16  Temp:  97.9 F (36.6 C)  SpO2: 97% 97%     General: Awake, no distress.  Very pleasant.  Appears mildly ill but in no acute  distress.  Occasional dry cough. CV:  Good peripheral perfusion.  Mild tachycardia.  Normal heart tones Resp:  Normal effort.  Clear bilateral no wheezing.  Normal work of breathing normal oxygen saturation on room air. Abd:  No distention.  Soft nontender nondistended Other:  No lower extremity edema.  Mucous membranes slightly dry.   ED Results / Procedures / Treatments   Labs (all labs ordered are listed, but only abnormal results are displayed) Labs Reviewed  SARS CORONAVIRUS 2 BY RT PCR - Abnormal; Notable for the following components:      Result Value   SARS Coronavirus 2 by RT PCR POSITIVE (*)    All other components within normal limits  COMPREHENSIVE METABOLIC PANEL - Abnormal; Notable for the following components:   Glucose, Bld 165 (*)    Creatinine, Ser 1.26 (*)    GFR, Estimated 44 (*)    All other components within normal limits  CBC WITH DIFFERENTIAL/PLATELET - Abnormal; Notable for the following components:   WBC 11.0 (*)    Neutro Abs 7.9 (*)  Monocytes Absolute 1.5 (*)    All other components within normal limits  PROCALCITONIN  CBG MONITORING, ED  TROPONIN I (HIGH SENSITIVITY)     EKG  And interpreted by me at 1440 heart rate 105 QRS 90 QTc 460 Sinus tachycardia, mild nonspecific ST segment abnormality.   RADIOLOGY  CT Angio Chest PE W and/or Wo Contrast  Result Date: 04/18/2022 CLINICAL DATA:  COVID positive, concern for pulmonary embolism. EXAM: CT ANGIOGRAPHY CHEST WITH CONTRAST TECHNIQUE: Multidetector CT imaging of the chest was performed using the standard protocol during bolus administration of intravenous contrast. Multiplanar CT image reconstructions and MIPs were obtained to evaluate the vascular anatomy. RADIATION DOSE REDUCTION: This exam was performed according to the departmental dose-optimization program which includes automated exposure control, adjustment of the mA and/or kV according to patient size and/or use of iterative  reconstruction technique. CONTRAST:  77m OMNIPAQUE IOHEXOL 350 MG/ML SOLN COMPARISON:  Same day chest radiograph. FINDINGS: Cardiovascular: Satisfactory opacification of the pulmonary arteries to the segmental level. No evidence of pulmonary embolism. Vascular calcifications are seen in the coronary arteries and aortic arch. Normal heart size. No pericardial effusion. Mediastinum/Nodes: No enlarged mediastinal, hilar, or axillary lymph nodes. Thyroid gland, trachea, and esophagus demonstrate no significant findings. Lungs/Pleura: There is a 5 mm solid pulmonary nodule in the right middle lobe (series 13, image 43). There is moderate atelectasis of the left lower lobe. This corresponds to the opacity seen on same day chest radiograph. There is no pleural effusion or pneumothorax. Upper Abdomen: There is a small hiatal hernia. Musculoskeletal: No chest wall abnormality. No acute or significant osseous findings. Review of the MIP images confirms the above findings. IMPRESSION: 1. No evidence of acute pulmonary embolism. 2. Moderate atelectasis of the left lower lobe. This corresponds to the opacity seen on same day chest radiograph. 3. Solid pulmonary nodule in the right middle lobe measuring 5 mm. No follow-up needed if patient is low-risk.This recommendation follows the consensus statement: Guidelines for Management of Incidental Pulmonary Nodules Detected on CT Images: From the Fleischner Society 2017; Radiology 2017; 284:228-243. Aortic Atherosclerosis (ICD10-I70.0). Electronically Signed   By: TZerita BoersM.D.   On: 04/18/2022 21:51   Chest x-ray interpreted by me as possible infiltrate retrocardiac region.  Reviewed radiologist report, and thus perform CT to better elucidate abnormality seen on imaging of the chest x-ray  Patient non-smoker, suspect low risk pulmonary nodule but referred patient to pulmonary nodule clinic for appropriate follow-up   PROCEDURES:  Critical Care performed:  No  Procedures   MEDICATIONS ORDERED IN ED: Medications  sodium chloride 0.9 % bolus 1,000 mL (0 mLs Intravenous Stopped 04/18/22 1842)  iohexol (OMNIPAQUE) 350 MG/ML injection 75 mL (75 mLs Intravenous Contrast Given 04/18/22 2118)     IMPRESSION / MDM / ASSESSMENT AND PLAN / ED COURSE  I reviewed the triage vital signs and the nursing notes.                              Differential diagnosis includes, but is not limited to, COVID-19, viral illness, possible colitis associated with viral syndrome though based on the clinical history provided and risk factors side effects of Paxlovid I suspect that this is likely medication induced side effect.  She did not have a syncopal episode but felt lightheaded, and also feels dry her labs reveal mild renal insufficiency, and she appears dry by exam.  Plan to hydrate.  Thankfully she does  not appear to have any evidence of severe pulmonary disease at this point, but I have ordered a chest x-ray and procalcitonin as well.  Her lungs are clear though and her work of breathing appears quite normal at this time    EKG with mild nonspecific T wave abnormality but also lacks any chest pain.  She does not have acute hypoxia, she does not have any clinical signs or symptoms that would be suggestive of acute pulmonary embolism or DVT.  Suspect likely dehydration with associated lightheadedness in the setting of COVID-19 infection and Paxlovid.  Patient's presentation is most consistent with acute complicated illness / injury requiring diagnostic workup.  The patient is on the cardiac monitor to evaluate for evidence of arrhythmia and/or significant heart rate changes.  Clinical Course as of 04/18/22 2244  Thu Apr 18, 2022  2021 Patient resting comfortably at this time, currently eating snack and juice.  Awaiting CT to rule out PE and also further evaluate for possible pneumonia or empyema or other acute finding [MQ]    Clinical Course User Index [MQ]  Delman Kitten, MD   Labs interpreted as mild leukocytosis.  Metabolic panel notable for mild elevation of creatinine and minimal reduction of GFR.  Suspect likely dehydration based on clinical history  ----------------------------------------- 10:43 PM on 04/18/2022 -----------------------------------------  Patient reports feels improved, able to get up and ambulate without difficulty and no hypoxia.  Saturation 98% with ambulatory pulse oximetry.  She feels improved appears well without acute ongoing concern.  No further diarrhea, I suspect that was likely secondary to use of Paxlovid.  Careful return precautions discussed.  Return precautions and treatment recommendations and follow-up discussed with the patient who is agreeable with the plan.   FINAL CLINICAL IMPRESSION(S) / ED DIAGNOSES   Final diagnoses:  COVID-19  Dehydration     Rx / DC Orders   ED Discharge Orders          Ordered    AMB  Referral to Pulmonary Nodule Clinic       Comments: Nodule on CT   04/18/22 2213             Note:  This document was prepared using Dragon voice recognition software and may include unintentional dictation errors.   Delman Kitten, MD 04/18/22 2244

## 2022-04-18 NOTE — ED Provider Triage Note (Signed)
Emergency Medicine Provider Triage Evaluation Note  LESSLIE MOSSA , a 76 y.o. female  was evaluated in triage.  Pt complains of shortness of breath, v/d, +covid, taking paxlovid, is diabetic and feeling worse/dizzy/weak.  Review of Systems  Positive: dizzy Negative: cp  Physical Exam  BP (!) 136/94   Pulse (!) 110   Temp 98.1 F (36.7 C)   Resp 18   Wt 79.4 kg   SpO2 94%   BMI 30.04 kg/m  Gen:   Awake, no distress   Resp:  Normal effort  MSK:   Moves extremities without difficulty  Other:    Medical Decision Making  Medically screening exam initiated at 2:38 PM.  Appropriate orders placed.  [name] was informed that the remainder of the evaluation will be completed by another provider, this initial triage assessment does not replace that evaluation, and the importance of remaining in the ED until their evaluation is complete.     Versie Starks, PA-C 04/18/22 1440

## 2022-04-22 ENCOUNTER — Telehealth: Payer: Self-pay

## 2022-04-22 NOTE — Telephone Encounter (Signed)
Patient advised of BX results .aw 

## 2022-04-22 NOTE — Telephone Encounter (Signed)
-----   Message from Brendolyn Patty, MD sent at 04/22/2022 11:17 AM EDT ----- Skin , right lateral eyebrow MELANOCYTIC NEVUS, INTRADERMAL TYPE  Benign mole - please call patient

## 2022-04-22 NOTE — Telephone Encounter (Signed)
Lft pt msg to call for bx result./sh 

## 2022-04-26 ENCOUNTER — Ambulatory Visit (INDEPENDENT_AMBULATORY_CARE_PROVIDER_SITE_OTHER): Payer: Medicare HMO | Admitting: Family Medicine

## 2022-04-26 ENCOUNTER — Encounter: Payer: Self-pay | Admitting: Family Medicine

## 2022-04-26 VITALS — BP 116/70 | HR 86 | Temp 98.5°F | Resp 16 | Ht 64.0 in | Wt 179.7 lb

## 2022-04-26 DIAGNOSIS — R911 Solitary pulmonary nodule: Secondary | ICD-10-CM | POA: Diagnosis not present

## 2022-04-26 DIAGNOSIS — U071 COVID-19: Secondary | ICD-10-CM

## 2022-04-26 DIAGNOSIS — N179 Acute kidney failure, unspecified: Secondary | ICD-10-CM

## 2022-04-26 DIAGNOSIS — Z09 Encounter for follow-up examination after completed treatment for conditions other than malignant neoplasm: Secondary | ICD-10-CM | POA: Diagnosis not present

## 2022-04-26 DIAGNOSIS — J069 Acute upper respiratory infection, unspecified: Secondary | ICD-10-CM | POA: Diagnosis not present

## 2022-04-26 NOTE — Progress Notes (Signed)
Patient ID: Christine Roberts, female    DOB: 1945-09-17, 76 y.o.   MRN: [id number]  PCP: Steele Sizer, MD  Chief Complaint  Patient presents with   Follow-up    ED for COVID, tested at home on Wed still positive. Today feeling still weak but much better    Subjective:   [name] is a 76 y.o. female, presents to clinic with CC of the following:  HPI  Pt here for  COVID and ER follow up Sx started 10/10, virtual visit on 10/11 with visit and dx got Paxlovid She had severe GI SE after taking paxlovid and on 10/12 went to ED  ER- dx with dehydration, mild AKI She stopped the paxlovid She was given fluids and they were going to admit her for obs and more fluids, but she went home  She has been gradually improving since then She has some mild nasal congestion lingering Fatigue - just gets tired more than her normal - but no SOB, CP, sweats, near syncope  Today COVID Day 10 - afebrile, minor respiratory sx  Can stop masking tomorrow Likely not contagious   ER did CT chest with noted nodule - if pt low risk there was no need for f/up, however pt is concerned that she does have risk As a young adult she smoked briefly several years, then second hand smoke exposure for decades with her husband and in her home (30 year exposure) she smoked marijuana  Last 20 years lived in mostly smoke free home/environment    Patient Active Problem List   Diagnosis Date Noted   Atherosclerosis of aorta ([place]) 05/25/2018   Seasonal allergic rhinitis 09/12/2015   TIA (transient ischemic attack) 05/24/2015   Senile purpura ([place]) 05/09/2015   Benign essential HTN 03/18/2015   Pain in shoulder 03/18/2015   Dyslipidemia 03/18/2015   History of surgery to major organs, presenting hazards to health 03/18/2015   Hearing loss 03/18/2015   Hiatal hernia 03/18/2015   History of colon polyps 03/18/2015   Calculus of kidney 03/18/2015   B12 deficiency 03/18/2015   Microalbuminuria 03/18/2015    Adult BMI 30+ 03/18/2015   Arthritis, degenerative 03/18/2015   Diabetes mellitus with nephropathy ([place]) 03/18/2015      Current Outpatient Medications:    ACCU-CHEK SOFTCLIX LANCETS lancets, 1 each by Other route daily., Disp: 100 each, Rfl: 1   acetaminophen (TYLENOL) 650 MG CR tablet, Take 650 mg by mouth every 8 (eight) hours as needed., Disp: , Rfl:    Alcohol Swabs (B-D SINGLE USE SWABS REGULAR) PADS, 1 each by Does not apply route daily., Disp: 400 each, Rfl: 1   aspirin EC 81 MG tablet, Take 1 tablet (81 mg total) by mouth daily., Disp: 90 tablet, Rfl: 0   atorvastatin (LIPITOR) 40 MG tablet, TAKE 1 TABLET 2 TIMES A WEEK., Disp: 24 tablet, Rfl: 1   calcium citrate (CALCITRATE - DOSED IN MG ELEMENTAL CALCIUM) 950 MG tablet, Take 200 mg of elemental calcium by mouth daily. Has D3 in it as well, Disp: , Rfl:    Cholecalciferol (VITAMIN D) 125 MCG (5000 UT) CAPS, Take by mouth. Pt taking 3 times per week, Disp: , Rfl:    Coenzyme Q10 (COQ10) 100 MG CAPS, Take 1 capsule by mouth daily., Disp: , Rfl:    dapagliflozin propanediol (FARXIGA) 10 MG TABS tablet, Take 1 tablet (10 mg total) by mouth daily before breakfast. Patient receives via [employer] Patient Assistance., Disp: 90 tablet, Rfl: 3  fluconazole (DIFLUCAN) 150 MG tablet, Take 1 tablet (150 mg total) by mouth every other day. Prn yeast, 3 per episode, Disp: 12 tablet, Rfl: 0   glucose blood (ACCU-CHEK AVIVA PLUS) test strip, TEST BLOOD SUGAR UP TO FOUR TIMES DAILY AS DIRECTED, Disp: 400 each, Rfl: 0   insulin degludec (TRESIBA FLEXTOUCH) 200 UNIT/ML FlexTouch Pen, Inject 28 Units into the skin daily. Patient receives through [employer] Patient Assistance through Dec 2022, Disp: , Rfl:    Insulin Pen Needle (NOVOFINE) 30G X 8 MM MISC, Inject 10 each into the skin as needed., Disp: 100 each, Rfl: 2   lisinopril (ZESTRIL) 10 MG tablet, TAKE 1 TABLET EVERY DAY, Disp: 90 tablet, Rfl: 1   metFORMIN (GLUCOPHAGE-XR) 750 MG 24 hr tablet, Take 1  tablet (750 mg total) by mouth daily with breakfast., Disp: 90 tablet, Rfl: 0   metroNIDAZOLE (METROCREAM) 0.75 % cream, Apply qd/bid at mid face and nose for rosacea, Disp: 45 g, Rfl: 5   mupirocin ointment (BACTROBAN) 2 %, Apply 1 Application topically daily. Apply to wound at right lower leg and cover until healed., Disp: 22 g, Rfl: 0   Semaglutide, 1 MG/DOSE, 4 MG/3ML SOPN, Inject 1 mg as directed once a week. Patient receives through [employer] Patient Assistance through Dec 2022, Disp: , Rfl:    VITAMIN B COMPLEX-C PO, Take by mouth., Disp: , Rfl:    Allergies  Allergen Reactions   Latex Rash   Erythromycin Other (See Comments)    Strawberry tongue     Social History   Tobacco Use   Smoking status: Never   Smokeless tobacco: Never   Tobacco comments:    smoking cessation materials not required  Vaping Use   Vaping Use: Never used  Substance Use Topics   Alcohol use: Yes    Alcohol/week: 1.0 standard drink of alcohol    Types: 1 Shots of liquor per week    Comment: socially   Drug use: No      Chart Review Today: I personally reviewed active problem list, medication list, allergies, family history, social history, health maintenance, notes from last encounter, lab results, imaging with the patient/caregiver today.   Review of Systems  Constitutional: Negative.   HENT: Negative.    Eyes: Negative.   Respiratory: Negative.    Cardiovascular: Negative.   Gastrointestinal: Negative.   Endocrine: Negative.   Genitourinary: Negative.   Musculoskeletal: Negative.   Skin: Negative.   Allergic/Immunologic: Negative.   Neurological: Negative.   Hematological: Negative.   Psychiatric/Behavioral: Negative.    All other systems reviewed and are negative.      Objective:   Vitals:   04/26/22 1104  BP: 116/70  Pulse: 86  Resp: 16  Temp: 98.5 F (36.9 C)  TempSrc: Oral  SpO2: 98%  Weight: 179 lb 11.2 oz (81.5 kg)  Height: '5\' 4"'$  (1.626 m)    Body mass index  is 30.85 kg/m.  Physical Exam Vitals and nursing note reviewed.  Constitutional:      General: She is not in acute distress.    Appearance: Normal appearance. She is well-developed and well-groomed. She is not ill-appearing, toxic-appearing or diaphoretic.  HENT:     Head: Normocephalic and atraumatic.     Right Ear: External ear normal.     Left Ear: External ear normal.     Nose: Congestion and rhinorrhea present.     Mouth/Throat:     Mouth: Mucous membranes are moist.     Pharynx:  No oropharyngeal exudate or posterior oropharyngeal erythema.  Eyes:     General:        Right eye: No discharge.        Left eye: No discharge.     Conjunctiva/sclera: Conjunctivae normal.  Neck:     Trachea: No tracheal deviation.  Cardiovascular:     Rate and Rhythm: Normal rate and regular rhythm.     Pulses: Normal pulses.     Heart sounds: Normal heart sounds.  Pulmonary:     Effort: Pulmonary effort is normal. No tachypnea, accessory muscle usage or respiratory distress.     Breath sounds: Normal breath sounds. No stridor, decreased air movement or transmitted upper airway sounds. No decreased breath sounds, wheezing, rhonchi or rales.  Abdominal:     General: Bowel sounds are normal.     Palpations: Abdomen is soft.  Skin:    General: Skin is warm and dry.     Findings: No rash.  Neurological:     Mental Status: She is alert.     Motor: No abnormal muscle tone.     Coordination: Coordination normal.  Psychiatric:        Mood and Affect: Mood normal.        Behavior: Behavior normal. Behavior is cooperative.      Results for orders placed or performed during the hospital encounter of 04/18/22  SARS Coronavirus 2 by RT PCR (hospital order, performed in [facility] hospital lab) *cepheid single result test* Anterior Nasal Swab   Specimen: Anterior Nasal Swab  Result Value Ref Range   SARS Coronavirus 2 by RT PCR POSITIVE (A) NEGATIVE  Comprehensive metabolic panel  Result Value  Ref Range   Sodium 136 135 - 145 mmol/L   Potassium 4.2 3.5 - 5.1 mmol/L   Chloride 102 98 - 111 mmol/L   CO2 22 22 - 32 mmol/L   Glucose, Bld 165 (H) 70 - 99 mg/dL   BUN 22 8 - 23 mg/dL   Creatinine, Ser 1.26 (H) 0.44 - 1.00 mg/dL   Calcium 9.0 8.9 - 10.3 mg/dL   Total Protein 7.5 6.5 - 8.1 g/dL   Albumin 3.9 3.5 - 5.0 g/dL   AST 22 15 - 41 U/L   ALT 22 0 - 44 U/L   Alkaline Phosphatase 58 38 - 126 U/L   Total Bilirubin 1.0 0.3 - 1.2 mg/dL   GFR, Estimated 44 (L) >60 mL/min   Anion gap 12 5 - 15  CBC with Differential  Result Value Ref Range   WBC 11.0 (H) 4.0 - 10.5 K/uL   RBC 4.99 3.87 - 5.11 MIL/uL   Hemoglobin 13.8 12.0 - 15.0 g/dL   HCT 42.8 36.0 - 46.0 %   MCV 85.8 80.0 - 100.0 fL   MCH 27.7 26.0 - 34.0 pg   MCHC 32.2 30.0 - 36.0 g/dL   RDW 13.9 11.5 - 15.5 %   Platelets 258 150 - 400 K/uL   nRBC 0.0 0.0 - 0.2 %   Neutrophils Relative % 72 %   Neutro Abs 7.9 (H) 1.7 - 7.7 K/uL   Lymphocytes Relative 13 %   Lymphs Abs 1.4 0.7 - 4.0 K/uL   Monocytes Relative 13 %   Monocytes Absolute 1.5 (H) 0.1 - 1.0 K/uL   Eosinophils Relative 0 %   Eosinophils Absolute 0.0 0.0 - 0.5 K/uL   Basophils Relative 1 %   Basophils Absolute 0.1 0.0 - 0.1 K/uL   Immature Granulocytes 1 %  Abs Immature Granulocytes 0.05 0.00 - 0.07 K/uL  Procalcitonin - Baseline  Result Value Ref Range   Procalcitonin <0.10 ng/mL  POC CBG, ED  Result Value Ref Range   Glucose-Capillary 86 70 - 99 mg/dL  Troponin I (High Sensitivity)  Result Value Ref Range   Troponin I (High Sensitivity) 7 <18 ng/L       Assessment & Plan:     ICD-10-CM   1. COVID-19  U07.1    day 10, can be done with isolation and masking tomorrow, improving with some lingering congestion and fatigue    2. AKI (acute kidney injury) ([place])  N17.9    she appears hydrated, offered to recheck chemistry but she declined    3. Upper respiratory tract infection, unspecified type  J06.9    some lingering URI covid sx, she looks a  little tired, but VSS, lungs clear, and only noted congestion on exam, continue supportive/sx tx    4. Nodule of middle lobe of right lung  R91.1 CT CHEST NODULE FOLLOW UP LOW DOSE W/O   5 mm solid pulm nodule right middle lobe, she wants to do f/up imaging and feels she has sig risk with hx of smoking and second hand smoke exposure    5. Encounter for examination following treatment at hospital  Z09    ER records, imaging and labs all reviewed with the pt today and appropriate f/up was done and offered (labs declined)          Delsa Grana, PA-C 04/26/22 11:12 AM

## 2022-04-26 NOTE — Patient Instructions (Addendum)
Covid insolation when sick is usually for 5-10 days from the start of your symptoms. Your symptoms started around Tuesday 10/10 (day 0) so tomorrow would be day 10 and you do not have to isolate or mask after that as long as you have no fever and aren't having severe respiratory symptoms.  I recommend getting next vaccines after you have felt WELL for 1-2 weeks.

## 2022-06-08 ENCOUNTER — Other Ambulatory Visit: Payer: Self-pay | Admitting: Family Medicine

## 2022-06-08 DIAGNOSIS — E785 Hyperlipidemia, unspecified: Secondary | ICD-10-CM

## 2022-06-10 ENCOUNTER — Other Ambulatory Visit: Payer: Self-pay

## 2022-06-10 DIAGNOSIS — E785 Hyperlipidemia, unspecified: Secondary | ICD-10-CM

## 2022-06-13 ENCOUNTER — Institutional Professional Consult (permissible substitution): Payer: Medicare HMO | Admitting: Pulmonary Disease

## 2022-06-17 ENCOUNTER — Telehealth: Payer: Self-pay

## 2022-06-17 NOTE — Progress Notes (Signed)
I reach out to [employer] to check the status of patient renewal application for year 8948.   Per [employer] did not receive his application. Patient application needs to be refax to [phone] which is the emergency line, and to give 10 days to process due to the high volume of faxes coming through.Patient ID number with [employer] is [id number].  Notified Clinical pharmacist.   Anderson [place] Clinical Pharmacist Assistant [phone]

## 2022-06-17 NOTE — Progress Notes (Unsigned)
Name: Christine Roberts   MRN: [id number]    DOB: [birth year]   Date:06/18/2022       Progress Note  Subjective  Chief Complaint  Follow Up  HPI  DMII: . Last visit she was on higher dose of Tresiba  but fasting was in the 80's now fasting between 110-130's, A1C is up trom 7 % to 7.3 % we went up on Ozempic to 2 mg and today it is down to 6.8%, still on, Farxiga,  Metformin and also on Tresiba 16 units per day    . She has dyslipidemia and obesity, neuropathy, nephropathy and HTN . Taking statins and ACE.She noticed yeast vaginitis on farxiga, we will refill diflucan but can try to take it only once at onset of symptoms   HTN : compliant with medication,  no chest pain or palpitation but occasionally getting dizzy, we will decrease lisinopril from 10 mg to 5 mg .   Hyperlipidemia:. Last LDL went up from 68 to 101. She is now taking atorvastatin three times a week , previously taking it only twice a week because of cramps    TIA versus Complicated migraine: she developed dizziness, headache and aphasia  on 05/24/2019 - she was transported by EMS to [facility], she had normal Echo, CT, MRI of brain and carotid dopplers.  She was seen by neurologist and advised to continue on aspirin and lipitor in case it was a TIA, but likely a complicated migraine.Unchanged   B12 deficiency: taking it otc three times a week , last level was at goal .She still has intermittent tingling on toes it may be secondary to diabetic neuropathy  Nephrolithiasis: seen by Urologist and no intervention required. No recent episodes   Senile purpura:  skin very thin, has it on both arms today, reassurance given    Atherosclerosis of aorta: continue statin therapy and aspirin 81 mg daily , discussed needs to get LDL lower than 70, she is now taking it more often   Grade III hemorrhoids; seen by Dr. Alice Reichert - GI and was referred to general surgeon for possible banding, but she wants to wait until her next colonoscopy in 2024 , she wants  to be under anesthesia .Unchanged   Patient Active Problem List   Diagnosis Date Noted   Atherosclerosis of aorta ([place]) 05/25/2018   Seasonal allergic rhinitis 09/12/2015   TIA (transient ischemic attack) 05/24/2015   Senile purpura ([place]) 05/09/2015   Benign essential HTN 03/18/2015   Pain in shoulder 03/18/2015   Dyslipidemia 03/18/2015   History of surgery to major organs, presenting hazards to health 03/18/2015   Hearing loss 03/18/2015   Hiatal hernia 03/18/2015   History of colon polyps 03/18/2015   Calculus of kidney 03/18/2015   B12 deficiency 03/18/2015   Microalbuminuria 03/18/2015   Adult BMI 30+ 03/18/2015   Arthritis, degenerative 03/18/2015   Diabetes mellitus with nephropathy ([place]) 03/18/2015    Past Surgical History:  Procedure Laterality Date   ABDOMINAL HYSTERECTOMY     CHOLECYSTECTOMY     COLONOSCOPY  2015   COLONOSCOPY WITH PROPOFOL N/A 03/04/2018   Procedure: COLONOSCOPY WITH PROPOFOL;  Surgeon: Toledo, Benay Pike, MD;  Location: [facility] ENDOSCOPY;  Service: Gastroenterology;  Laterality: N/A;   cystoscopic     stent placed in ureter   LITHOTRIPSY  12/2007   MYRINGOTOMY Right 11/2009    Family History  Problem Relation Age of Onset   Cancer Mother    Diabetes Mother    Diabetes Daughter  CAD Father    Diabetes Daughter        hypoglycemia    Social History   Tobacco Use   Smoking status: Never   Smokeless tobacco: Never   Tobacco comments:    smoking cessation materials not required  Substance Use Topics   Alcohol use: Yes    Alcohol/week: 1.0 standard drink of alcohol    Types: 1 Shots of liquor per week    Comment: socially     Current Outpatient Medications:    ACCU-CHEK SOFTCLIX LANCETS lancets, 1 each by Other route daily., Disp: 100 each, Rfl: 1   acetaminophen (TYLENOL) 650 MG CR tablet, Take 650 mg by mouth every 8 (eight) hours as needed., Disp: , Rfl:    Alcohol Swabs (B-D SINGLE USE SWABS REGULAR) PADS, 1 each by Does not  apply route daily., Disp: 400 each, Rfl: 1   aspirin EC 81 MG tablet, Take 1 tablet (81 mg total) by mouth daily., Disp: 90 tablet, Rfl: 0   atorvastatin (LIPITOR) 40 MG tablet, Take 1 tablet (40 mg total) by mouth 3 (three) times a week. She states still has rx at home, Disp: 36 tablet, Rfl: 1   calcium citrate (CALCITRATE - DOSED IN MG ELEMENTAL CALCIUM) 950 MG tablet, Take 200 mg of elemental calcium by mouth daily. Has D3 in it as well, Disp: , Rfl:    Cholecalciferol (VITAMIN D) 125 MCG (5000 UT) CAPS, Take by mouth. Pt taking 3 times per week, Disp: , Rfl:    Coenzyme Q10 (COQ10) 100 MG CAPS, Take 1 capsule by mouth daily., Disp: , Rfl:    dapagliflozin propanediol (FARXIGA) 10 MG TABS tablet, Take 1 tablet (10 mg total) by mouth daily before breakfast. Patient receives via [employer] Patient Assistance., Disp: 90 tablet, Rfl: 3   glucose blood (ACCU-CHEK AVIVA PLUS) test strip, TEST BLOOD SUGAR UP TO FOUR TIMES DAILY AS DIRECTED, Disp: 400 each, Rfl: 0   insulin degludec (TRESIBA FLEXTOUCH) 200 UNIT/ML FlexTouch Pen, Inject 28 Units into the skin daily. Patient receives through [employer] Patient Assistance through Dec 2022, Disp: , Rfl:    Insulin Pen Needle (NOVOFINE) 30G X 8 MM MISC, Inject 10 each into the skin as needed., Disp: 100 each, Rfl: 2   lisinopril (ZESTRIL) 10 MG tablet, TAKE 1 TABLET EVERY DAY, Disp: 90 tablet, Rfl: 1   metFORMIN (GLUCOPHAGE-XR) 750 MG 24 hr tablet, Take 1 tablet (750 mg total) by mouth daily with breakfast., Disp: 90 tablet, Rfl: 0   metroNIDAZOLE (METROCREAM) 0.75 % cream, Apply qd/bid at mid face and nose for rosacea, Disp: 45 g, Rfl: 5   mupirocin ointment (BACTROBAN) 2 %, Apply 1 Application topically daily. Apply to wound at right lower leg and cover until healed., Disp: 22 g, Rfl: 0   Semaglutide, 1 MG/DOSE, 4 MG/3ML SOPN, Inject 1 mg as directed once a week. Patient receives through [employer] Patient Assistance through Dec 2022, Disp: , Rfl:    VITAMIN B  COMPLEX-C PO, Take by mouth., Disp: , Rfl:    fluconazole (DIFLUCAN) 150 MG tablet, Take 1 tablet (150 mg total) by mouth every other day. Prn yeast, 3 per episode (Patient not taking: Reported on 06/18/2022), Disp: 12 tablet, Rfl: 0  Allergies  Allergen Reactions   Latex Rash   Erythromycin Other (See Comments)    Strawberry tongue    I personally reviewed active problem list, medication list, allergies, family history, social history, health maintenance with the patient/caregiver today.  ROS  Constitutional: Negative for fever , positive for  weight change.  Respiratory: Negative for cough and shortness of breath.   Cardiovascular: Negative for chest pain or palpitations.  Gastrointestinal: Negative for abdominal pain, no bowel changes.  Musculoskeletal: Negative for gait problem or joint swelling.  Skin: Negative for rash.  Neurological: positive  for dizziness occasionally but no headache.  No other specific complaints in a complete review of systems (except as listed in HPI above).   Objective  Vitals:   06/18/22 1400  BP: 116/70  Pulse: 96  Resp: 16  Temp: 97.8 F (36.6 C)  TempSrc: Oral  SpO2: 99%  Weight: 173 lb 4.8 oz (78.6 kg)  Height: '5\' 4"'$  (1.626 m)    Body mass index is 29.75 kg/m.  Physical Exam  Constitutional: Patient appears well-developed and well-nourished.  No distress.  HEENT: head atraumatic, normocephalic, pupils equal and reactive to light, neck supple Cardiovascular: Normal rate, regular rhythm and normal heart sounds.  No murmur heard. No BLE edema. Pulmonary/Chest: Effort normal and breath sounds normal. No respiratory distress. Abdominal: Soft.  There is no tenderness. Psychiatric: Patient has a normal mood and affect. behavior is normal. Judgment and thought content normal.   Recent Results (from the past 2160 hour(s))  Comprehensive metabolic panel     Status: Abnormal   Collection Time: 04/18/22  2:40 PM  Result Value Ref Range    Sodium 136 135 - 145 mmol/L   Potassium 4.2 3.5 - 5.1 mmol/L   Chloride 102 98 - 111 mmol/L   CO2 22 22 - 32 mmol/L   Glucose, Bld 165 (H) 70 - 99 mg/dL    Comment: Glucose reference range applies only to samples taken after fasting for at least 8 hours.   BUN 22 8 - 23 mg/dL   Creatinine, Ser 1.26 (H) 0.44 - 1.00 mg/dL   Calcium 9.0 8.9 - 10.3 mg/dL   Total Protein 7.5 6.5 - 8.1 g/dL   Albumin 3.9 3.5 - 5.0 g/dL   AST 22 15 - 41 U/L   ALT 22 0 - 44 U/L   Alkaline Phosphatase 58 38 - 126 U/L   Total Bilirubin 1.0 0.3 - 1.2 mg/dL   GFR, Estimated 44 (L) >60 mL/min    Comment: (NOTE) Calculated using the CKD-EPI Creatinine Equation (2021)    Anion gap 12 5 - 15    Comment: Performed at [facility], [address] [place], [place] [address]  CBC with Differential     Status: Abnormal   Collection Time: 04/18/22  2:40 PM  Result Value Ref Range   WBC 11.0 (H) 4.0 - 10.5 K/uL   RBC 4.99 3.87 - 5.11 MIL/uL   Hemoglobin 13.8 12.0 - 15.0 g/dL   HCT 42.8 36.0 - 46.0 %   MCV 85.8 80.0 - 100.0 fL   MCH 27.7 26.0 - 34.0 pg   MCHC 32.2 30.0 - 36.0 g/dL   RDW 13.9 11.5 - 15.5 %   Platelets 258 150 - 400 K/uL   nRBC 0.0 0.0 - 0.2 %   Neutrophils Relative % 72 %   Neutro Abs 7.9 (H) 1.7 - 7.7 K/uL   Lymphocytes Relative 13 %   Lymphs Abs 1.4 0.7 - 4.0 K/uL   Monocytes Relative 13 %   Monocytes Absolute 1.5 (H) 0.1 - 1.0 K/uL   Eosinophils Relative 0 %   Eosinophils Absolute 0.0 0.0 - 0.5 K/uL   Basophils Relative 1 %   Basophils  Absolute 0.1 0.0 - 0.1 K/uL   Immature Granulocytes 1 %   Abs Immature Granulocytes 0.05 0.00 - 0.07 K/uL    Comment: Performed at [facility], [address] [place], [place] [address]  SARS Coronavirus 2 by RT PCR (hospital order, performed in [facility] hospital lab) *cepheid single result test* Anterior Nasal Swab     Status: Abnormal   Collection Time: 04/18/22  2:40 PM   Specimen: Anterior Nasal Swab  Result Value Ref Range    SARS Coronavirus 2 by RT PCR POSITIVE (A) NEGATIVE    Comment: (NOTE) SARS-CoV-2 target nucleic acids are DETECTED  SARS-CoV-2 RNA is generally detectable in upper respiratory specimens  during the acute phase of infection.  Positive results are indicative  of the presence of the identified virus, but do not rule out bacterial infection or co-infection with other pathogens not detected by the test.  Clinical correlation with patient history and  other diagnostic information is necessary to determine patient infection status.  The expected result is negative.  Fact Sheet for Patients:   https://www.patel.info/   Fact Sheet for Healthcare Providers:   https://hall.com/    This test is not yet approved or cleared by the [place] FDA and  has been authorized for detection and/or diagnosis of SARS-CoV-2 by FDA under an Emergency Use Authorization (EUA).  This EUA will remain in effect (meaning this test can be used) for the duration of  the COVID-19 declaration under Section 564(b)(1)  of the Act, 21 U.S.C. section 360-bbb-3(b)(1), unless the authorization is terminated or revoked sooner.   Performed at [facility], [address], [place] [address]   Troponin I (High Sensitivity)     Status: None   Collection Time: 04/18/22  2:48 PM  Result Value Ref Range   Troponin I (High Sensitivity) 7 <18 ng/L    Comment: (NOTE) Elevated high sensitivity troponin I (hsTnI) values and significant  changes across serial measurements may suggest ACS but many other  chronic and acute conditions are known to elevate hsTnI results.  Refer to the "Links" section for chest pain algorithms and additional  guidance. Performed at [facility], [address] [place], [place] [address]   Procalcitonin - Baseline     Status: None   Collection Time: 04/18/22  2:48 PM  Result Value Ref Range   Procalcitonin <0.10 ng/mL     Comment:        Interpretation: PCT (Procalcitonin) <= 0.5 ng/mL: Systemic infection (sepsis) is not likely. Local bacterial infection is possible. (NOTE)       Sepsis PCT Algorithm           Lower Respiratory Tract                                      Infection PCT Algorithm    ----------------------------     ----------------------------         PCT < 0.25 ng/mL                PCT < 0.10 ng/mL          Strongly encourage             Strongly discourage   discontinuation of antibiotics    initiation of antibiotics    ----------------------------     -----------------------------       PCT 0.25 - 0.50 ng/mL  PCT 0.10 - 0.25 ng/mL               OR       >80% decrease in PCT            Discourage initiation of                                            antibiotics      Encourage discontinuation           of antibiotics    ----------------------------     -----------------------------         PCT >= 0.50 ng/mL              PCT 0.26 - 0.50 ng/mL               AND        <80% decrease in PCT             Encourage initiation of                                             antibiotics       Encourage continuation           of antibiotics    ----------------------------     -----------------------------        PCT >= 0.50 ng/mL                  PCT > 0.50 ng/mL               AND         increase in PCT                  Strongly encourage                                      initiation of antibiotics    Strongly encourage escalation           of antibiotics                                     -----------------------------                                           PCT <= 0.25 ng/mL                                                 OR                                        > 80% decrease in PCT  Discontinue / Do not initiate                                             antibiotics  Performed at [facility], [address]  [place], [place] [address]   POC CBG, ED     Status: None   Collection Time: 04/18/22  8:13 PM  Result Value Ref Range   Glucose-Capillary 86 70 - 99 mg/dL    Comment: Glucose reference range applies only to samples taken after fasting for at least 8 hours.  POCT HgB A1C     Status: Abnormal   Collection Time: 06/18/22  2:09 PM  Result Value Ref Range   Hemoglobin A1C 6.8 (A) 4.0 - 5.6 %   HbA1c POC (<> result, manual entry)     HbA1c, POC (prediabetic range)     HbA1c, POC (controlled diabetic range)      PHQ2/9:    06/18/2022    2:02 PM 04/26/2022   11:04 AM 04/17/2022    9:19 AM 03/12/2022    3:06 PM 02/19/2022    9:58 AM  Depression screen PHQ 2/9  Decreased Interest 0 0 0 0 0  Down, Depressed, Hopeless 0 0 0 0 0  PHQ - 2 Score 0 0 0 0 0  Altered sleeping 0 0  0 0  Tired, decreased energy 0 0  0 0  Change in appetite 0 0  1 3  Feeling bad or failure about yourself  0 0  0 0  Trouble concentrating 0 0  0 0  Moving slowly or fidgety/restless 0 0  0 0  Suicidal thoughts 0 0  0 0  PHQ-9 Score 0 0  1 3  Difficult doing work/chores  Not difficult at all       phq 9 is negative   Fall Risk:    06/18/2022    2:02 PM 04/26/2022   11:04 AM 04/17/2022    9:18 AM 03/12/2022    3:06 PM 02/19/2022    9:58 AM  Fall Risk   Falls in the past year? 0 0 0 0 0  Number falls in past yr:  0 0  0  Injury with Fall?  0 0  0  Risk for fall due to : No Fall Risks No Fall Risks  No Fall Risks No Fall Risks  Follow up Falls prevention discussed;Education provided;Falls evaluation completed Falls prevention discussed;Education provided Falls evaluation completed Falls prevention discussed;Education provided;Falls evaluation completed Falls prevention discussed      Functional Status Survey: Is the patient deaf or have difficulty hearing?: No Does the patient have difficulty seeing, even when wearing glasses/contacts?: No Does the patient have difficulty concentrating, remembering, or making  decisions?: No Does the patient have difficulty walking or climbing stairs?: Yes Does the patient have difficulty dressing or bathing?: No Does the patient have difficulty doing errands alone such as visiting a doctor's office or shopping?: No    Assessment & Plan  1. Type 2 diabetes with nephropathy (HCC)  - POCT HgB A1C - lisinopril (ZESTRIL) 5 MG tablet; Take 1 tablet (5 mg total) by mouth daily.  Dispense: 90 tablet; Refill: 1  2. Senile purpura ([place])  Reassurance given   3. Atherosclerosis of aorta ([place])  On statin therapy   4. Hypertension associated with type 2 diabetes mellitus (HCC)  Bp is  towards low end of normal and we will decrease dose of lisinopril to 5 mg and monitor   5. Hx-TIA (transient ischemic attack)   6. Dyslipidemia   7. Vitamin D deficiency  Continue supplementation  8. Vaginal yeast infection  - fluconazole (DIFLUCAN) 150 MG tablet; Take 1 tablet (150 mg total) by mouth every other day. Prn yeast, 3 per episode  Dispense: 12 tablet; Refill: 0 - metFORMIN (GLUCOPHAGE-XR) 750 MG 24 hr tablet; Take 1 tablet (750 mg total) by mouth daily with breakfast.  Dispense: 90 tablet; Refill: 0  9. Benign essential HTN  - lisinopril (ZESTRIL) 5 MG tablet; Take 1 tablet (5 mg total) by mouth daily.  Dispense: 90 tablet; Refill: 1  10. Vaginal itching  - metFORMIN (GLUCOPHAGE-XR) 750 MG 24 hr tablet; Take 1 tablet (750 mg total) by mouth daily with breakfast.  Dispense: 90 tablet; Refill: 0

## 2022-06-18 ENCOUNTER — Encounter: Payer: Self-pay | Admitting: Family Medicine

## 2022-06-18 ENCOUNTER — Ambulatory Visit (INDEPENDENT_AMBULATORY_CARE_PROVIDER_SITE_OTHER): Payer: Medicare HMO | Admitting: Family Medicine

## 2022-06-18 VITALS — BP 116/70 | HR 96 | Temp 97.8°F | Resp 16 | Ht 64.0 in | Wt 173.3 lb

## 2022-06-18 DIAGNOSIS — I10 Essential (primary) hypertension: Secondary | ICD-10-CM

## 2022-06-18 DIAGNOSIS — E1121 Type 2 diabetes mellitus with diabetic nephropathy: Secondary | ICD-10-CM

## 2022-06-18 DIAGNOSIS — E785 Hyperlipidemia, unspecified: Secondary | ICD-10-CM | POA: Diagnosis not present

## 2022-06-18 DIAGNOSIS — I700 Atherosclerosis of aorta: Secondary | ICD-10-CM

## 2022-06-18 DIAGNOSIS — B3731 Acute candidiasis of vulva and vagina: Secondary | ICD-10-CM

## 2022-06-18 DIAGNOSIS — N898 Other specified noninflammatory disorders of vagina: Secondary | ICD-10-CM

## 2022-06-18 DIAGNOSIS — D692 Other nonthrombocytopenic purpura: Secondary | ICD-10-CM | POA: Diagnosis not present

## 2022-06-18 DIAGNOSIS — I152 Hypertension secondary to endocrine disorders: Secondary | ICD-10-CM

## 2022-06-18 DIAGNOSIS — Z8673 Personal history of transient ischemic attack (TIA), and cerebral infarction without residual deficits: Secondary | ICD-10-CM

## 2022-06-18 DIAGNOSIS — E1159 Type 2 diabetes mellitus with other circulatory complications: Secondary | ICD-10-CM

## 2022-06-18 DIAGNOSIS — E559 Vitamin D deficiency, unspecified: Secondary | ICD-10-CM | POA: Diagnosis not present

## 2022-06-18 LAB — POCT GLYCOSYLATED HEMOGLOBIN (HGB A1C): Hemoglobin A1C: 6.8 % — AB (ref 4.0–5.6)

## 2022-06-18 MED ORDER — METFORMIN HCL ER 750 MG PO TB24: 750.0000 mg | ORAL_TABLET | Freq: Every day | ORAL | 0 refills | Status: DC

## 2022-06-18 MED ORDER — FLUCONAZOLE 150 MG PO TABS: 150.0000 mg | ORAL_TABLET | ORAL | 0 refills | Status: DC

## 2022-06-18 MED ORDER — LISINOPRIL 5 MG PO TABS: 5.0000 mg | ORAL_TABLET | Freq: Every day | ORAL | 1 refills | Status: DC

## 2022-06-20 ENCOUNTER — Ambulatory Visit: Payer: Medicare HMO | Admitting: Family Medicine

## 2022-06-24 ENCOUNTER — Ambulatory Visit: Payer: Medicare HMO | Admitting: Pulmonary Disease

## 2022-06-24 ENCOUNTER — Encounter: Payer: Self-pay | Admitting: Pulmonary Disease

## 2022-06-24 VITALS — BP 100/70 | HR 98 | Temp 97.4°F | Ht 64.0 in | Wt 171.6 lb

## 2022-06-24 DIAGNOSIS — J9811 Atelectasis: Secondary | ICD-10-CM | POA: Diagnosis not present

## 2022-06-24 DIAGNOSIS — R911 Solitary pulmonary nodule: Secondary | ICD-10-CM | POA: Diagnosis not present

## 2022-06-24 DIAGNOSIS — Z8616 Personal history of COVID-19: Secondary | ICD-10-CM

## 2022-06-24 NOTE — Patient Instructions (Signed)
We will order a follow-up CT scan of the chest to evaluate the lung nodule and the abnormality in your left lung which I think was related to pneumonia due to [place].  See you in follow-up in 2 to 3 months time or as required once the CT chest is reviewed.

## 2022-06-24 NOTE — Progress Notes (Signed)
Subjective:    Patient ID: Christine Roberts, female    DOB: 08-01-1945, 76 y.o.   MRN: [id number] Patient Care Team: Steele Sizer, MD as PCP - General (Family Medicine) Dingeldein, Remo Lipps, MD as Consulting Physician (Ophthalmology) Billey Co, MD as Consulting Physician (Urology) Efrain Sella, MD as Consulting Physician (Gastroenterology) Germaine Pomfret, [facility] (Pharmacist) Debroah Loop, PA-C as Physician Assistant (Urology)  Chief Complaint  Patient presents with   Consult   HPI Patient is a 76 year old remote former smoker with minimal tobacco exposure in the past (5 PY or less) presents for evaluation of a lung nodule noted on chest CT on 18 April 2022.  Patient was referred by Dr. Kizzie Roberts, emergency room physician at [facility].  Patient's primary care provider is Dr. Steele Sizer.  The patient has been asymptomatic with regards to this nodule.  She states she had COVID in September of 2021 and had to have "the infusion" (REGEN-COV) and was quite ill with the illness.  He did not have much sequela after that.  Here recently in October she developed symptoms of recurrent viral illness and was confirmed to have COVID-19 again.  She had to however present to the emergency room due to fatigue, fevers, cough, lightheadedness and nausea.  She was being treated with Paxlovid.  He also had pleuritic left-sided chest pain prompted a CT angio chest.  The CT showed a 5 mm nodule in the right middle lobe which prompted the consult here however review of the chest CT also shows significant atelectasis of the left lower lobe which could have represented an pneumonic infiltrate.  Currently she does not describe any cough or sputum production.  No shortness of breath, no chest pain.  No hemoptysis.  She has not had any weight loss or anorexia.  No prior history of respiratory disease such as asthma or COPD.  She is a retired Gaffer.  Worked for [employer] and [employer].  No significant occupational exposure to inorganic dusts.   Review of Systems A 10 point review of systems was performed and it is as noted above otherwise negative.  Past Medical History:  Diagnosis Date   Diabetes mellitus without complication ([place])    Hyperlipidemia    Hypertension    Kidney stone    Left shoulder pain    Obesity    Osteoarthritis    Ovarian failure    Squamous cell carcinoma of skin 11/12/2021   right distal medial calf, EDC   Stroke (HCC)    Vitamin B12 deficiency (non anemic)    Past Surgical History:  Procedure Laterality Date   ABDOMINAL HYSTERECTOMY     CHOLECYSTECTOMY     COLONOSCOPY  2015   COLONOSCOPY WITH PROPOFOL N/A 03/04/2018   Procedure: COLONOSCOPY WITH PROPOFOL;  Surgeon: Toledo, Benay Pike, MD;  Location: [facility] ENDOSCOPY;  Service: Gastroenterology;  Laterality: N/A;   cystoscopic     stent placed in ureter   LITHOTRIPSY  12/2007   MYRINGOTOMY Right 11/2009   Patient Active Problem List   Diagnosis Date Noted   Atherosclerosis of aorta ([place]) 05/25/2018   Seasonal allergic rhinitis 09/12/2015   TIA (transient ischemic attack) 05/24/2015   Senile purpura ([place]) 05/09/2015   Benign essential HTN 03/18/2015   Pain in shoulder 03/18/2015   Dyslipidemia 03/18/2015   History of surgery to major organs, presenting hazards to health 03/18/2015   Hearing loss 03/18/2015   Hiatal hernia 03/18/2015   History of colon polyps 03/18/2015  Calculus of kidney 03/18/2015   B12 deficiency 03/18/2015   Microalbuminuria 03/18/2015   Adult BMI 30+ 03/18/2015   Arthritis, degenerative 03/18/2015   Diabetes mellitus with nephropathy ([place]) 03/18/2015   Family History  Problem Relation Age of Onset   Cancer Mother    Diabetes Mother    Diabetes Daughter    CAD Father    Diabetes Daughter        hypoglycemia   Social History   Tobacco Use   Smoking status: Former    Packs/day: 0.50    Years: 10.00    Total pack years: 5.00     Types: Cigarettes    Passive exposure: Past   Smokeless tobacco: Never   Tobacco comments:    smoking cessation materials not required.  Quit 30 years ago.  Substance Use Topics   Alcohol use: Yes    Alcohol/week: 1.0 standard drink of alcohol    Types: 1 Shots of liquor per week    Comment: socially   Allergies  Allergen Reactions   Latex Rash   Erythromycin Other (See Comments)    Strawberry tongue   Social History   Tobacco Use   Smoking status: Former    Packs/day: 0.50    Years: 10.00    Total pack years: 5.00    Types: Cigarettes    Passive exposure: Past   Smokeless tobacco: Never   Tobacco comments:    smoking cessation materials not required.  Quit 30 years ago.  Substance Use Topics   Alcohol use: Yes    Alcohol/week: 1.0 standard drink of alcohol    Types: 1 Shots of liquor per week    Comment: socially   Immunization History  Administered Date(s) Administered   Fluad Quad(high Dose 65+) 03/24/2019   Influenza Split 04/11/2009   Influenza, High Dose Seasonal PF 03/21/2015, 05/10/2016, 03/30/2017, 03/30/2018   Influenza, Seasonal, Injecte, Preservative Fre 04/07/2012   Influenza,inj,Quad PF,6+ Mos 03/07/2014   Influenza-Unspecified 03/07/2014, 03/30/2017, 05/15/2020, 05/30/2021, 05/23/2022   PFIZER(Purple Top)SARS-COV-2 Vaccination 07/15/2019, 08/05/2019, 06/27/2020   Pfizer Covid-19 Vaccine Bivalent Booster 48yr & up 12/26/2020, 05/23/2022   Pneumococcal Conjugate-13 03/21/2015   Pneumococcal Polysaccharide-23 10/25/2009   Tdap 10/25/2009, 03/14/2018   Zoster, Live 09/24/2011      Objective:   Physical Exam BP 100/70 (BP Location: Left Arm, Patient Position: Sitting, Cuff Size: Normal)   Pulse 98   Temp (!) 97.4 F (36.3 C) (Oral)   Ht '5\' 4"'$  (1.626 m)   Wt 171 lb 9.6 oz (77.8 kg)   SpO2 97%   BMI 29.46 kg/m  GENERAL: Well-developed, overweight woman, no acute distress, fully ambulatory, no conversational dyspnea. HEAD: Normocephalic,  atraumatic.  EYES: Pupils equal, round, reactive to light.  No scleral icterus.  MOUTH: Some teeth in poor repair, oral mucosa moist.  No thrush. NECK: Supple. No thyromegaly. Trachea midline. No JVD.  No adenopathy. PULMONARY: Good air entry bilaterally.  No adventitious sounds. CARDIOVASCULAR: S1 and S2. Regular rate and rhythm.  No rubs, murmurs or gallops heard. ABDOMEN: Benign. MUSCULOSKELETAL: No joint deformity, no clubbing, no edema.  NEUROLOGIC: No overt focal deficit, no gait disturbance, speech is fluent. SKIN: Intact,warm,dry. PSYCH: Mood and behavior normal  Representative image from CT performed of October 2023 showing a 5 mm nodule on the right middle lobe, independently reviewed (arrow):   Representative image of the CT performed 18 April 2022 showing atelectasis/infiltrate on the left lower lobe, independently reviewed (arrow):      Assessment & Plan:  ICD-10-CM   1. Atelectasis, left  J98.11 CT Chest Wo Contrast   Recommend repeat CT chest Need to ensure this has cleared May need bronchoscopy    2. Incidental lung nodule, > 31m and < 89m R91.1 CT Chest Wo Contrast   Will follow-up on CT chest    3. Personal history of COVID-19  Z86.16    This issue adds complexity to her management Query postinflammatory pulmonary fibrosis     Orders Placed This Encounter  Procedures   CT Chest Wo Contrast    Standing Status:   Future    Standing Expiration Date:   06/25/2023    Scheduling Instructions:     As soon as possible    Order Specific Question:   Preferred imaging location?    Answer:   [facility] Will see the patient in follow-up in 2 to 3 months time or as determined by follow-up CT.  C.Renold DonMD Advanced Bronchoscopy PCCM [facility]    *This note was dictated using voice recognition software/Dragon.  Despite best efforts to proofread, errors can occur which can change the meaning. Any transcriptional errors  that result from this process are unintentional and may not be fully corrected at the time of dictation.

## 2022-06-28 ENCOUNTER — Ambulatory Visit
Admission: RE | Admit: 2022-06-28 | Discharge: 2022-06-28 | Disposition: A | Discharge status: Home / Self Care | Payer: Medicare HMO | Source: Ambulatory Visit | Attending: Family Medicine | Admitting: Family Medicine

## 2022-06-28 DIAGNOSIS — J9811 Atelectasis: Secondary | ICD-10-CM | POA: Insufficient documentation

## 2022-06-28 DIAGNOSIS — R918 Other nonspecific abnormal finding of lung field: Secondary | ICD-10-CM | POA: Diagnosis not present

## 2022-06-28 DIAGNOSIS — R911 Solitary pulmonary nodule: Secondary | ICD-10-CM | POA: Diagnosis not present

## 2022-07-03 ENCOUNTER — Other Ambulatory Visit: Payer: Self-pay

## 2022-07-03 DIAGNOSIS — R911 Solitary pulmonary nodule: Secondary | ICD-10-CM

## 2022-07-10 ENCOUNTER — Telehealth: Payer: Self-pay | Admitting: Family Medicine

## 2022-07-10 ENCOUNTER — Ambulatory Visit: Payer: Medicare HMO

## 2022-07-10 NOTE — Progress Notes (Signed)
Care Management & Coordination Services Outreach Note  07/10/2022 Name: Christine Roberts MRN: [id number] DOB: Apr 15, 1946  Referred by: Steele Sizer, MD  Patient had a phone appointment scheduled with clinical pharmacist today.  An unsuccessful telephone outreach was attempted today. The patient was referred to the pharmacist for assistance with medications, care management and care coordination.   Patient will NOT be penalized in any way for missing a Care Management & Coordination Services appointment. The no-show fee does not apply.  Malva Limes, Mina Pharmacist Practitioner  [facility] [phone]

## 2022-07-10 NOTE — Telephone Encounter (Unsigned)
Copied from [place] [id number]. Topic: Appointment Scheduling - Scheduling Inquiry for Clinic >> Jul 10, 2022  3:57 PM Sabas Sous wrote: Reason for CRM: Pt returned missed appt call from [facility], needs to be rescheduled. Please advise

## 2022-07-11 ENCOUNTER — Other Ambulatory Visit: Payer: Self-pay | Admitting: Family Medicine

## 2022-07-11 DIAGNOSIS — B3731 Acute candidiasis of vulva and vagina: Secondary | ICD-10-CM

## 2022-07-22 ENCOUNTER — Ambulatory Visit: Payer: Medicare HMO | Admitting: Dermatology

## 2022-07-22 VITALS — BP 98/57 | HR 99

## 2022-07-22 DIAGNOSIS — L814 Other melanin hyperpigmentation: Secondary | ICD-10-CM | POA: Diagnosis not present

## 2022-07-22 DIAGNOSIS — L988 Other specified disorders of the skin and subcutaneous tissue: Secondary | ICD-10-CM

## 2022-07-22 DIAGNOSIS — D2361 Other benign neoplasm of skin of right upper limb, including shoulder: Secondary | ICD-10-CM | POA: Diagnosis not present

## 2022-07-22 DIAGNOSIS — Z85828 Personal history of other malignant neoplasm of skin: Secondary | ICD-10-CM | POA: Diagnosis not present

## 2022-07-22 DIAGNOSIS — L853 Xerosis cutis: Secondary | ICD-10-CM | POA: Diagnosis not present

## 2022-07-22 DIAGNOSIS — D239 Other benign neoplasm of skin, unspecified: Secondary | ICD-10-CM

## 2022-07-22 DIAGNOSIS — L578 Other skin changes due to chronic exposure to nonionizing radiation: Secondary | ICD-10-CM

## 2022-07-22 NOTE — Progress Notes (Signed)
   Follow-Up Visit   Subjective  Christine Roberts is a 77 y.o. female who presents for the following: Hx of SCC (R distal medial calf, 85mf/u).   The following portions of the chart were reviewed this encounter and updated as appropriate:       Review of Systems:  No other skin or systemic complaints except as noted in HPI or Assessment and Plan.  Objective  Well appearing patient in no apparent distress; mood and affect are within normal limits.  A focused examination was performed including right calf, R dorsum hand, face. Relevant physical exam findings are noted in the Assessment and Plan.  R distal medial calf 6.073mcrusted macule central, no erythema, no open ulceration, no recurrance     R hand dorsum Dark blue gray macule 5.74m47mface Rhytides and volume loss. Puffiness under eyes    Assessment & Plan   Hemangiomas - Red papules - Discussed benign nature - Observe - Call for any changes - R lower leg  Xerosis - diffuse xerotic patches - recommend gentle, hydrating skin care - gentle skin care handout given  - Sample Cerave Diabetic cream given  Actinic Damage - chronic, secondary to cumulative UV radiation exposure/sun exposure over time - diffuse scaly erythematous macules with underlying dyspigmentation - Recommend daily broad spectrum sunscreen SPF 30+ to sun-exposed areas, reapply every 2 hours as needed.  - Recommend staying in the shade or wearing long sleeves, sun glasses (UVA+UVB protection) and wide brim hats (4-inch brim around the entire circumference of the hat). - Call for new or changing lesions.   Lentigines - Scattered tan macules - Due to sun exposure - Benign-appearing, observe - Recommend daily broad spectrum sunscreen SPF 30+ to sun-exposed areas, reapply every 2 hours as needed. - Call for any changes  - face  History of SCC (squamous cell carcinoma) of skin R distal medial calf  Clear. Observe for recurrence. Call clinic for  new or changing lesions.  Recommend regular skin exams, daily broad-spectrum spf 30+ sunscreen use, and photoprotection.     Start Vasoline oint and bandage qd until hard scab falls off  Blue nevus R hand dorsum  Benign-appearing.  Observation.  Call clinic for new or changing moles.  Recommend daily use of broad spectrum spf 30+ sunscreen to sun-exposed areas.    Elastosis of skin face  Discussed blepharoplasty for puffiness under eyes    Return in about 6 months (around 01/20/2023) for TBSE, Hx of SCC, Hx of AKs.  I, SonOthelia PullingMA, am acting as scribe for TarBrendolyn PattyD .  Documentation: I have reviewed the above documentation for accuracy and completeness, and I agree with the above.  TarBrendolyn Patty

## 2022-07-22 NOTE — Patient Instructions (Addendum)
Start Vasolin oint and bandage daily until hard scab falls off of the right lower calf.   Due to recent changes in healthcare laws, you may see results of your pathology and/or laboratory studies on MyChart before the doctors have had a chance to review them. We understand that in some cases there may be results that are confusing or concerning to you. Please understand that not all results are received at the same time and often the doctors may need to interpret multiple results in order to provide you with the best plan of care or course of treatment. Therefore, we ask that you please give [place] 2 business days to thoroughly review all your results before contacting the office for clarification. Should we see a critical lab result, you will be contacted sooner.   If You Need Anything After Your Visit  If you have any questions or concerns for your doctor, please call our main line at [phone] and press option 4 to reach your doctor's medical assistant. If no one answers, please leave a voicemail as directed and we will return your call as soon as possible. Messages left after 4 pm will be answered the following business day.   You may also send [place] a message via [place]. We typically respond to MyChart messages within 1-2 business days.  For prescription refills, please ask your pharmacy to contact our office. Our fax number is [phone].  If you have an urgent issue when the clinic is closed that cannot wait until the next business day, you can page your doctor at the number below.    Please note that while we do our best to be available for urgent issues outside of office hours, we are not available 24/7.   If you have an urgent issue and are unable to reach [place], you may choose to seek medical care at your doctor's office, retail clinic, urgent care center, or emergency room.  If you have a medical emergency, please immediately call 911 or go to the emergency department.  Pager  Numbers  - Dr. Nehemiah Massed: [phone]  - Dr. Laurence Ferrari: [phone]  - Dr. Nicole Kindred: [phone]  In the event of inclement weather, please call our main line at [phone] for an update on the status of any delays or closures.  Dermatology Medication Tips: Please keep the boxes that topical medications come in in order to help keep track of the instructions about where and how to use these. Pharmacies typically print the medication instructions only on the boxes and not directly on the medication tubes.   If your medication is too expensive, please contact our office at [phone] option 4 or send [place] a message through [place].   We are unable to tell what your co-pay for medications will be in advance as this is different depending on your insurance coverage. However, we may be able to find a substitute medication at lower cost or fill out paperwork to get insurance to cover a needed medication.   If a prior authorization is required to get your medication covered by your insurance company, please allow [place] 1-2 business days to complete this process.  Drug prices often vary depending on where the prescription is filled and some pharmacies may offer cheaper prices.  The website www.goodrx.com contains coupons for medications through different pharmacies. The prices here do not account for what the cost may be with help from insurance (it may be cheaper with your insurance), but the website can give you the price if  you did not use any insurance.  - You can print the associated coupon and take it with your prescription to the pharmacy.  - You may also stop by our office during regular business hours and pick up a GoodRx coupon card.  - If you need your prescription sent electronically to a different pharmacy, notify our office through [facility] or by phone at [phone] option 4.     Si Usted Necesita Algo Despus de Su Visita  Tambin puede enviarnos un mensaje a travs de  Pharmacist, community. Por lo general respondemos a los mensajes de MyChart en el transcurso de 1 a 2 das hbiles.  Para renovar recetas, por favor pida a su farmacia que se ponga en contacto con nuestra oficina. Harland Dingwall de fax es [place] [phone].  Si tiene un asunto urgente cuando la clnica est cerrada y que no puede esperar hasta el siguiente da hbil, puede llamar/localizar a su doctor(a) al nmero que aparece a continuacin.   Por favor, tenga en cuenta que aunque hacemos todo lo posible para estar disponibles para asuntos urgentes fuera del horario de [place], no estamos disponibles las 24 horas del da, los 7 das de la [place].   Si tiene un problema urgente y no puede comunicarse con nosotros, puede optar por buscar atencin mdica  en el consultorio de su doctor(a), en una clnica privada, en un centro de atencin urgente o en una sala de emergencias.  Si tiene Engineering geologist, por favor llame inmediatamente al 911 o vaya a la sala de emergencias.  Nmeros de bper  - Dr. Nehemiah Massed: [phone]  - Dra. Moye: [phone]  - Dra. Nicole Kindred: [phone]  En caso de inclemencias del [place], por favor llame a Johnsie Kindred principal al [phone] para una actualizacin sobre el [place] de cualquier retraso o cierre.  Consejos para la medicacin en dermatologa: Por favor, guarde las cajas en las que vienen los medicamentos de uso tpico para ayudarle a seguir las instrucciones sobre dnde y cmo usarlos. Las farmacias generalmente imprimen las instrucciones del medicamento slo en las cajas y no directamente en los tubos del [place].   Si su medicamento es muy caro, por favor, pngase en contacto con Zigmund Daniel llamando al [phone] y presione la opcin 4 o envenos un mensaje a travs de Pharmacist, community.   No podemos decirle cul ser su copago por los medicamentos por adelantado ya que esto es diferente dependiendo de la cobertura de su seguro. Sin embargo, es posible que  podamos encontrar un medicamento sustituto a Electrical engineer un formulario para que el seguro cubra el medicamento que se considera necesario.   Si se requiere una autorizacin previa para que su compaa de seguros [place] su medicamento, por favor permtanos de 1 a 2 das hbiles para completar este proceso.  Los precios de los medicamentos varan con frecuencia dependiendo del Environmental consultant de dnde se surte la receta y alguna farmacias pueden ofrecer precios ms baratos.  El sitio web www.goodrx.com tiene cupones para medicamentos de Airline pilot. Los precios aqu no tienen en cuenta lo que podra costar con la ayuda del seguro (puede ser ms barato con su seguro), pero el sitio web puede darle el precio si no utiliz Research scientist (physical sciences).  - Puede imprimir el cupn correspondiente y llevarlo con su receta a la farmacia.  - Tambin puede pasar por nuestra oficina durante el horario de atencin regular y Charity fundraiser una tarjeta de cupones de GoodRx.  - Si necesita que su receta  se enve electrnicamente a una farmacia diferente, informe a nuestra oficina a travs de MyChart de [place] o por telfono llamando al [phone] y presione la opcin 4.

## 2022-08-26 ENCOUNTER — Encounter: Payer: Self-pay | Admitting: Pulmonary Disease

## 2022-08-26 ENCOUNTER — Ambulatory Visit: Payer: Medicare HMO | Admitting: Pulmonary Disease

## 2022-08-26 VITALS — BP 110/70 | HR 71 | Temp 97.7°F | Ht 64.0 in | Wt 168.0 lb

## 2022-08-26 DIAGNOSIS — R911 Solitary pulmonary nodule: Secondary | ICD-10-CM | POA: Diagnosis not present

## 2022-08-26 DIAGNOSIS — J9811 Atelectasis: Secondary | ICD-10-CM

## 2022-08-26 DIAGNOSIS — Z8616 Personal history of COVID-19: Secondary | ICD-10-CM | POA: Diagnosis not present

## 2022-08-26 NOTE — Progress Notes (Signed)
Subjective:    Patient ID: Christine Roberts, female    DOB: Oct 29, 1945, 77 y.o.   MRN: [id number] Patient Care Team: Steele Sizer, MD as PCP - General (Family Medicine) Dingeldein, Remo Lipps, MD as Consulting Physician (Ophthalmology) Billey Co, MD as Consulting Physician (Urology) Efrain Sella, MD as Consulting Physician (Gastroenterology) Germaine Pomfret, [facility] (Pharmacist) Debroah Loop, PA-C as Physician Assistant (Urology)  Chief Complaint  Patient presents with   Follow-up    Nodule. Breathing is good.    HPI Patient is a 77 year old remote former smoker with minimal tobacco exposure in the past (5 PY or less) presents for follow-up of a lung nodule noted on chest CT on 18 April 2022. The patient has been asymptomatic with regards to this nodule.  She states she had COVID in September of 2021 and had to have "the infusion" (REGEN-COV) and was quite ill with the illness.  He did not have much sequela after that.  In October she developed symptoms of recurrent viral illness and was confirmed to have COVID-19 again.  She had to however present to the emergency room due to fatigue, fevers, cough, lightheadedness and nausea.  She was treated with Paxlovid.  She also had pleuritic left-sided chest pain prompted a CT angio chest.  The CT showed a 5 mm nodule in the right middle lobe which prompted the consult here however review of the chest CT also shows significant atelectasis of the left lower lobe which was likely due to rounded atelectasis.  She had a follow-up CT chest on 28 June 2022, this showed stable pulmonary nodule and stable architectural change on the left lower lobe present since 2019 and likely rounded atelectasis.  It is recommended the patient have a chest CT follow-up in a year's time.   Currently she does not describe any cough or sputum production.  No shortness of breath, no chest pain.  No hemoptysis. She has not had any weight loss or anorexia.   No prior history of respiratory disease such as asthma or COPD.   Review of Systems A 10 point review of systems was performed and it is as noted above otherwise negative.  Patient Active Problem List   Diagnosis Date Noted   Atherosclerosis of aorta ([place]) 05/25/2018   Seasonal allergic rhinitis 09/12/2015   TIA (transient ischemic attack) 05/24/2015   Senile purpura ([place]) 05/09/2015   Benign essential HTN 03/18/2015   Pain in shoulder 03/18/2015   Dyslipidemia 03/18/2015   History of surgery to major organs, presenting hazards to health 03/18/2015   Hearing loss 03/18/2015   Hiatal hernia 03/18/2015   History of colon polyps 03/18/2015   Calculus of kidney 03/18/2015   B12 deficiency 03/18/2015   Microalbuminuria 03/18/2015   Adult BMI 30+ 03/18/2015   Arthritis, degenerative 03/18/2015   Diabetes mellitus with nephropathy ([place]) 03/18/2015   Social History   Tobacco Use   Smoking status: Former    Packs/day: 0.50    Years: 10.00    Total pack years: 5.00    Types: Cigarettes    Passive exposure: Past   Smokeless tobacco: Never   Tobacco comments:    smoking cessation materials not required.  Quit 30 years ago.  Substance Use Topics   Alcohol use: Yes    Alcohol/week: 1.0 standard drink of alcohol    Types: 1 Shots of liquor per week    Comment: socially   Allergies  Allergen Reactions   Latex Rash   Erythromycin Other (See  Comments)    Strawberry tongue   Current Meds  Medication Sig   ACCU-CHEK SOFTCLIX LANCETS lancets 1 each by Other route daily.   acetaminophen (TYLENOL) 650 MG CR tablet Take 650 mg by mouth every 8 (eight) hours as needed.   Alcohol Swabs (B-D SINGLE USE SWABS REGULAR) PADS 1 each by Does not apply route daily.   aspirin EC 81 MG tablet Take 1 tablet (81 mg total) by mouth daily.   atorvastatin (LIPITOR) 40 MG tablet Take 1 tablet (40 mg total) by mouth 3 (three) times a week. She states still has rx at home   calcium citrate (CALCITRATE -  DOSED IN MG ELEMENTAL CALCIUM) 950 MG tablet Take 200 mg of elemental calcium by mouth daily. Has D3 in it as well   Cholecalciferol (VITAMIN D) 125 MCG (5000 UT) CAPS Take by mouth. Pt taking 3 times per week   Coenzyme Q10 (COQ10) 100 MG CAPS Take 1 capsule by mouth daily.   dapagliflozin propanediol (FARXIGA) 10 MG TABS tablet Take 1 tablet (10 mg total) by mouth daily before breakfast. Patient receives via [employer] Patient Assistance.   fluconazole (DIFLUCAN) 150 MG tablet TAKE 1 TABLET EVERY OTHER DAY AS NEEDED FOR YEAST (3 PER EPISODE)   glucose blood (ACCU-CHEK AVIVA PLUS) test strip TEST BLOOD SUGAR UP TO FOUR TIMES DAILY AS DIRECTED   insulin degludec (TRESIBA FLEXTOUCH) 200 UNIT/ML FlexTouch Pen Inject 28 Units into the skin daily. Patient receives through [employer] Patient Assistance through Dec 2022   Insulin Pen Needle (NOVOFINE) 30G X 8 MM MISC Inject 10 each into the skin as needed.   lisinopril (ZESTRIL) 5 MG tablet Take 1 tablet (5 mg total) by mouth daily.   metFORMIN (GLUCOPHAGE-XR) 750 MG 24 hr tablet Take 1 tablet (750 mg total) by mouth daily with breakfast.   metroNIDAZOLE (METROCREAM) 0.75 % cream Apply qd/bid at mid face and nose for rosacea   Semaglutide, 1 MG/DOSE, 4 MG/3ML SOPN Inject 1 mg as directed once a week. Patient receives through [employer] Patient Assistance through Dec 2022   VITAMIN B COMPLEX-C PO Take by mouth.   Immunization History  Administered Date(s) Administered   Fluad Quad(high Dose 65+) 03/24/2019   Influenza Split 04/11/2009   Influenza, High Dose Seasonal PF 03/21/2015, 05/10/2016, 03/30/2017, 03/30/2018   Influenza, Seasonal, Injecte, Preservative Fre 04/07/2012   Influenza,inj,Quad PF,6+ Mos 03/07/2014   Influenza-Unspecified 03/07/2014, 03/30/2017, 05/15/2020, 05/30/2021, 05/23/2022   PFIZER(Purple Top)SARS-COV-2 Vaccination 07/15/2019, 08/05/2019, 06/27/2020   Pfizer Covid-19 Vaccine Bivalent Booster 3yr & up 12/26/2020, 05/23/2022    Pneumococcal Conjugate-13 03/21/2015   Pneumococcal Polysaccharide-23 10/25/2009   Tdap 10/25/2009, 03/14/2018   Zoster, Live 09/24/2011       Objective:   Physical Exam BP 110/70 (BP Location: Left Arm, Cuff Size: Normal)   Pulse 71   Temp 97.7 F (36.5 C)   Ht 5' 4"$  (1.626 m)   Wt 168 lb (76.2 kg)   SpO2 97%   BMI 28.84 kg/m   SpO2: 97 % O2 Device: None (Room air)  GENERAL: Well-developed, overweight woman, no acute distress, fully ambulatory, no conversational dyspnea. HEAD: Normocephalic, atraumatic.  EYES: Pupils equal, round, reactive to light.  No scleral icterus.  MOUTH: Some teeth in poor repair, oral mucosa moist.  No thrush. NECK: Supple. No thyromegaly. Trachea midline. No JVD.  No adenopathy. PULMONARY: Good air entry bilaterally.  No adventitious sounds. CARDIOVASCULAR: S1 and S2. Regular rate and rhythm.  No rubs, murmurs or gallops heard.  ABDOMEN: Benign. MUSCULOSKELETAL: No joint deformity, no clubbing, no edema.  NEUROLOGIC: No overt focal deficit, no gait disturbance, speech is fluent. SKIN: Intact,warm,dry. PSYCH: Mood and behavior normal  Representative image from CT performed 28 June 2022, showing a 7 x 3 mm right middle lobe nodule fissural plane that may be an intrapulmonary lymph node (1).  This is unchanged area of architectural distortion on the left lower lobe (2) present since 2019 and likely represents rounded atelectasis:      Assessment & Plan:     ICD-10-CM   1. Incidental lung nodule, > 57m and < 877m R91.1    Primary care has ordered CT for October We will see the patient in follow-up after that CT    2. Atelectasis, left  J98.11    This is rounded atelectasis Likely postinfectious Stable since 2019    3. Personal history of COVID-19  Z86.16    COVID-19 x 2 No overt sequela     Will see the patient in follow-up after her October CT chest.  Patient is to contact usKorearior to that time should any new difficulties arise.  C.Renold DonMD Advanced Bronchoscopy PCCM [facility]    *This note was dictated using voice recognition software/Dragon.  Despite best efforts to proofread, errors can occur which can change the meaning. Any transcriptional errors that result from this process are unintentional and may not be fully corrected at the time of dictation.

## 2022-08-26 NOTE — Patient Instructions (Signed)
We will see you in follow-up after your October 7 CT scan.  Call sooner should any new problems arise.

## 2022-08-27 ENCOUNTER — Ambulatory Visit: Payer: Medicare HMO

## 2022-08-30 ENCOUNTER — Ambulatory Visit: Payer: Medicare HMO

## 2022-09-03 ENCOUNTER — Other Ambulatory Visit: Payer: Self-pay | Admitting: Family Medicine

## 2022-09-03 DIAGNOSIS — N898 Other specified noninflammatory disorders of vagina: Secondary | ICD-10-CM

## 2022-09-03 DIAGNOSIS — B3731 Acute candidiasis of vulva and vagina: Secondary | ICD-10-CM

## 2022-09-13 ENCOUNTER — Ambulatory Visit (INDEPENDENT_AMBULATORY_CARE_PROVIDER_SITE_OTHER): Payer: Medicare HMO

## 2022-09-13 VITALS — BP 112/68 | Ht 64.0 in | Wt 167.2 lb

## 2022-09-13 DIAGNOSIS — Z0000 Encounter for general adult medical examination without abnormal findings: Secondary | ICD-10-CM

## 2022-09-13 NOTE — Progress Notes (Signed)
Christine Roberts is a 77 y.o. female who presents for Medicare Annual (Subsequent) preventive examination.  Review of Systems     Cardiac Risk Factors include: advanced age (>14mn, >>91women);diabetes mellitus;hypertension;dyslipidemia     Objective:    Today's Vitals   09/13/22 1522  BP: 112/68  Weight: 167 lb 3.2 oz (75.8 kg)  Height: '5\' 4"'$  (1.626 m)   Body mass index is 28.7 kg/m.     09/13/2022    3:45 PM 04/18/2022    2:36 PM 08/23/2021    2:54 PM 08/22/2020    3:23 PM 07/27/2019   10:57 AM 12/31/2018   12:57 PM 07/21/2018   11:07 AM  Advanced Directives  Does Patient Have a Medical Advance Directive? Yes No Yes Yes Yes No Yes  Type of AScientist, physiologicalof AHidden Valley LakeLiving will HBlackburnLiving will HCalhoun FallsLiving will  HWisconsin RapidsLiving will  Copy of [place]in Chart?   No - copy requested No - copy requested No - copy requested  No - copy requested  Would patient like information on creating a medical advance directive?      No - Patient declined     Current Medications (verified) Outpatient Encounter Medications as of 09/13/2022  Medication Sig   ACCU-CHEK SOFTCLIX LANCETS lancets 1 each by Other route daily.   acetaminophen (TYLENOL) 650 MG CR tablet Take 650 mg by mouth every 8 (eight) hours as needed.   Alcohol Swabs (B-D SINGLE USE SWABS REGULAR) PADS 1 each by Does not apply route daily.   amoxicillin (AMOXIL) 500 MG capsule Take 500 mg by mouth every 6 (six) hours.   aspirin EC 81 MG tablet Take 1 tablet (81 mg total) by mouth daily.   atorvastatin (LIPITOR) 40 MG tablet Take 1 tablet (40 mg total) by mouth 3 (three) times a week. She states still has rx at home   calcium citrate (CALCITRATE - DOSED IN MG ELEMENTAL CALCIUM) 950 MG tablet Take 200 mg of elemental calcium by mouth daily. Has D3 in it as well   Cholecalciferol (VITAMIN D) 125 MCG (5000 UT) CAPS Take by  mouth. Pt taking 3 times per week   Coenzyme Q10 (COQ10) 100 MG CAPS Take 1 capsule by mouth daily.   dapagliflozin propanediol (FARXIGA) 10 MG TABS tablet Take 1 tablet (10 mg total) by mouth daily before breakfast. Patient receives via [employer] Patient Assistance.   fluconazole (DIFLUCAN) 150 MG tablet TAKE 1 TABLET EVERY OTHER DAY AS NEEDED FOR YEAST (3 PER EPISODE)   glucose blood (ACCU-CHEK AVIVA PLUS) test strip TEST BLOOD SUGAR UP TO FOUR TIMES DAILY AS DIRECTED   insulin degludec (TRESIBA FLEXTOUCH) 200 UNIT/ML FlexTouch Pen Inject 28 Units into the skin daily. Patient receives through [employer] through Dec 2022   Insulin Pen Needle (NOVOFINE) 30G X 8 MM MISC Inject 10 each into the skin as needed.   lisinopril (ZESTRIL) 5 MG tablet Take 1 tablet (5 mg total) by mouth daily.   metFORMIN (GLUCOPHAGE-XR) 750 MG 24 hr tablet TAKE 1 TABLET EVERY DAY WITH BREAKFAST   metroNIDAZOLE (METROCREAM) 0.75 % cream Apply qd/bid at mid face and nose for rosacea   Semaglutide, 1 MG/DOSE, 4 MG/3ML SOPN Inject 1 mg as directed once a week. Patient receives through [employer] through Dec 2022   VITAMIN B COMPLEX-C PO Take by mouth.   No facility-administered encounter medications on  file as of 09/13/2022.    Allergies (verified) Latex and Erythromycin   History: Past Medical History:  Diagnosis Date   Diabetes mellitus without complication ([place])    Hyperlipidemia    Hypertension    Kidney stone    Left shoulder pain    Obesity    Osteoarthritis    Ovarian failure    Squamous cell carcinoma of skin 11/12/2021   right distal medial calf, EDC   Stroke ([facility])    Vitamin B12 deficiency (non anemic)    Past Surgical History:  Procedure Laterality Date   ABDOMINAL HYSTERECTOMY     CHOLECYSTECTOMY     COLONOSCOPY  2015   COLONOSCOPY WITH PROPOFOL N/A 03/04/2018   Procedure: COLONOSCOPY WITH PROPOFOL;  Surgeon: [place], Benay Pike, MD;  Location: [facility] ENDOSCOPY;   Service: Gastroenterology;  Laterality: N/A;   cystoscopic     stent placed in ureter   LITHOTRIPSY  12/2007   MYRINGOTOMY Right 11/2009   Family History  Problem Relation Age of Onset   Cancer Mother    Diabetes Mother    Diabetes Daughter    CAD Father    Diabetes Daughter        hypoglycemia   Social History   Socioeconomic History   Marital status: Widowed    Spouse name: Not on file   Number of children: 2   Years of education: Not on file   Highest education level: Associate degree: occupational, Hotel manager, or vocational program  Occupational History    Employer: [employer]    Comment: retired  Tobacco Use   Smoking status: Former    Packs/day: 0.50    Years: 10.00    Total pack years: 5.00    Types: Cigarettes    Passive exposure: Past   Smokeless tobacco: Never   Tobacco comments:    smoking cessation materials not required.  Quit 30 years ago.  Vaping Use   Vaping Use: Never used  Substance and Sexual Activity   Alcohol use: Yes    Alcohol/week: 1.0 standard drink of alcohol    Types: 1 Shots of liquor per week    Comment: socially   Drug use: No   Sexual activity: Not Currently  Other Topics Concern   Not on file  Social History Narrative   Pt lives alone   Social Determinants of Health   Financial Resource Strain: Low Risk  (09/13/2022)   Overall Financial Resource Strain (CARDIA)    Difficulty of Paying Living Expenses: Not hard at all  Food Insecurity: No Food Insecurity (09/13/2022)   Hunger Vital Sign    Worried About Running Out of Food in the Last Year: Never true    Ran Out of Food in the Last Year: Never true  Transportation Needs: No Transportation Needs (09/13/2022)   PRAPARE - Hydrologist (Medical): No    Lack of Transportation (Non-Medical): No  Physical Activity: Inactive (09/13/2022)   Exercise Vital Sign    Days of Exercise per Week: 0 days    Minutes of Exercise per Session: 0 min   Stress: No Stress Concern Present (09/13/2022)   [place]    Feeling of Stress : Not at all  Social Connections: Moderately Isolated (09/13/2022)   Social Connection and Isolation Panel [NHANES]    Frequency of Communication with Friends and Family: More than three times a week    Frequency of Social Gatherings with Friends and  Family: More than three times a week    Attends Religious Services: More than 4 times per year    Active Member of Clubs or Organizations: No    Attends Archivist Meetings: Never    Marital Status: Widowed    Tobacco Counseling Counseling given: Not Answered Tobacco comments: smoking cessation materials not required.  Quit 30 years ago.   Clinical Intake:  Pre-visit preparation completed: Yes  Pain : No/denies pain     BMI - recorded: 28.7 Nutritional Status: BMI 25 -29 Overweight Nutritional Risks: None Diabetes: Yes CBG done?: Yes (BS 120 this am by pt at home) CBG resulted in Enter/ Edit results?: No Did pt. bring in CBG monitor from home?: No  How often do you need to have someone help you when you read instructions, pamphlets, or other written materials from your doctor or pharmacy?: 1 - Never  Diabetic?yes  Interpreter Needed?: No  Information entered by :: B.Lyan Moyano,LPN   Activities of Daily Living    09/13/2022    3:46 PM 06/18/2022    2:02 PM  In your present state of health, do you have any difficulty performing the following activities:  Hearing? 0 0  Vision? 0 0  Difficulty concentrating or making decisions? 0 0  Walking or climbing stairs? 0 1  Dressing or bathing? 0 0  Doing errands, shopping? 0 0  Preparing Food and eating ? N   Using the Toilet? N   In the past six months, have you accidently leaked urine? N   Do you have problems with loss of bowel control? N   Managing your Medications? N   Managing your Finances? N   Housekeeping or  managing your Housekeeping? N     Patient Care Team: Steele Sizer, MD as PCP - General (Family Medicine) Dingeldein, Remo Lipps, MD as Consulting Physician (Ophthalmology) Billey Co, MD as Consulting Physician (Urology) Efrain Sella, MD as Consulting Physician (Gastroenterology) Germaine Pomfret, [facility] (Pharmacist) Debroah Loop, PA-C as Physician Assistant (Urology)  Indicate any recent Medical Services you may have received from other than [facility] providers in the past year (date may be approximate).     Assessment:   This is a routine wellness examination for Ke[place].  Hearing/Vision screen Hearing Screening - Comments:: Adequate hearing Vision Screening - Comments:: Adequate vision  Eye  Dietary issues and exercise activities discussed: Current Exercise Habits: Home exercise routine, Type of exercise: walking (yard work, Product manager sedentary), Time (Minutes): 30, Frequency (Times/Week): 3, Weekly Exercise (Minutes/Week): 90, Intensity: Mild, Exercise limited by: None identified   Goals Addressed             This Visit's Progress    DIET - INCREASE WATER INTAKE   On track    Recommend to drink at least 6-8 8oz glasses of water per day      Monitor and Manage My Blood Sugar-Diabetes Type 2   On track    Timeframe:  Long-Range Goal Priority:  High Start Date: 12/06/2020                            Expected End Date: 06/07/2022                      Follow Up within 90 days   - check blood sugar at prescribed times - check blood sugar if I feel it is too high or too low - take the  blood sugar log to all doctor visits    Why is this important?   Checking your blood sugar at home helps to keep it from getting very high or very low.  Writing the results in a diary or log helps the doctor know how to care for you.  Your blood sugar log should have the time, date and the results.  Also, write down the amount of insulin or other medicine that you  take.  Other information, like what you ate, exercise done and how you were feeling, will also be helpful.     Notes:        Depression Screen    09/13/2022    3:38 PM 06/18/2022    2:02 PM 04/26/2022   11:04 AM 04/17/2022    9:19 AM 03/12/2022    3:06 PM 02/19/2022    9:58 AM 10/22/2021   10:14 AM  PHQ 2/9 Scores  PHQ - 2 Score 0 0 0 0 0 0 0  PHQ- 9 Score  0 0  1 3 0    Fall Risk    09/13/2022    3:26 PM 06/18/2022    2:02 PM 04/26/2022   11:04 AM 04/17/2022    9:18 AM 03/12/2022    3:06 PM  [place] in the past year? 0 0 0 0 0  Number falls in past yr: 0  0 0   Injury with Fall? 0  0 0   Risk for fall due to : No Fall Risks No Fall Risks No Fall Risks  No Fall Risks  Follow up Falls prevention discussed;Education provided Falls prevention discussed;Education provided;Falls evaluation completed Falls prevention discussed;Education provided Falls evaluation completed Falls prevention discussed;Education provided;Falls evaluation completed    FALL RISK PREVENTION PERTAINING TO THE HOME:  Any stairs in or around the home? Yes  If so, are there any without handrails? Yes  Home free of loose throw rugs in walkways, pet beds, electrical cords, etc? Yes  Adequate lighting in your home to reduce risk of falls? Yes   ASSISTIVE DEVICES UTILIZED TO PREVENT FALLS:  Life alert? No  Use of a cane, walker or w/c? No  Grab bars in the bathroom? Yes  Shower chair or bench in shower? No  Elevated toilet seat or a handicapped toilet? Yes   TIMED UP AND GO:  Was the test performed? Yes .  Length of time to ambulate 10 feet: 8 sec.   Gait steady and fast without use of assistive device  Cognitive Function:        09/13/2022    3:47 PM 07/27/2019   11:01 AM 07/21/2018   11:11 AM 07/18/2017    3:23 PM  6CIT Screen  What Year? 0 points 0 points 0 points 0 points  What month? 0 points 0 points 0 points 0 points  What time? 0 points 0 points 0 points 0 points  Count back  from 20 0 points 0 points 0 points 0 points  Months in reverse 0 points 0 points 0 points 0 points  Repeat phrase 0 points 0 points 0 points 0 points  Total Score 0 points 0 points 0 points 0 points    Immunizations Immunization History  Administered Date(s) Administered   Fluad Quad(high Dose 65+) 03/24/2019   Influenza Split 04/11/2009   Influenza, High Dose Seasonal PF 03/21/2015, 05/10/2016, 03/30/2017, 03/30/2018   Influenza, Seasonal, Injecte, Preservative Fre 04/07/2012   Influenza,inj,Quad PF,6+ Mos 03/07/2014   Influenza-Unspecified 03/07/2014,  03/30/2017, 05/15/2020, 05/30/2021, 05/23/2022   PFIZER(Purple Top)SARS-COV-2 Vaccination 07/15/2019, 08/05/2019, 06/27/2020   Pfizer Covid-19 Vaccine Bivalent Booster 64yr & up 12/26/2020, 05/23/2022   Pneumococcal Conjugate-13 03/21/2015   Pneumococcal Polysaccharide-23 10/25/2009   Tdap 10/25/2009, 03/14/2018   Zoster, Live 09/24/2011    TDAP status: Up to date  Flu Vaccine status: Up to date  Pneumococcal vaccine status: Up to date  Covid-19 vaccine status: Completed vaccines  Qualifies for Shingles Vaccine? Yes   Zostavax completed Yes  Vaccine #1 Shingrix Completed?: No.    Education has been provided regarding the importance of this vaccine. Patient has been advised to call insurance company to determine out of pocket expense if they have not yet received this vaccine. Advised may also receive vaccine at local pharmacy or Health Dept. Verbalized acceptance and understanding.  Screening Tests Health Maintenance  Topic Date Due   OPHTHALMOLOGY EXAM  04/02/2019   COVID-19 Vaccine (6 - 2023-24 season) 07/18/2022   Zoster Vaccines- Shingrix (1 of 2) 09/16/2022 (Originally 07/13/1964)   FOOT EXAM  10/23/2022   MAMMOGRAM  11/01/2022   HEMOGLOBIN A1C  12/18/2022   Diabetic kidney evaluation - Urine ACR  02/20/2023   COLONOSCOPY (Pts 45-459yrInsurance coverage will need to be confirmed)  03/05/2023   Diabetic kidney  evaluation - eGFR measurement  04/19/2023   Medicare Annual Wellness (AWV)  09/13/2023   DTaP/Tdap/Td (3 - Td or Tdap) 03/14/2028   Pneumonia Vaccine 6548Years old  Completed   INFLUENZA VACCINE  Completed   DEXA SCAN  Completed   Hepatitis C Screening  Completed   HPV VACCINES  Aged Out    Health Maintenance  Health Maintenance Due  Topic Date Due   OPHTHALMOLOGY EXAM  04/02/2019   COVID-19 Vaccine (6 - 2023-24 season) 07/18/2022    Colorectal cancer screening: No longer required.   Mammogram status: No longer required due to age.  Bone Density status: Completed yes. Results reflect: Bone density results: NORMAL. Repeat every 5 years.  Lung Cancer Screening: (Low Dose CT Chest recommended if Age 78-80ears, 30 pack-year currently smoking OR have quit w/in 15years.) does not qualify.   Lung Cancer Screening Referral: no  Additional Screening:  Hepatitis C Screening: does not qualify; Completed no  Vision Screening: Recommended annual ophthalmology exams for early detection of glaucoma and other disorders of the eye. Is the patient up to date with their annual eye exam?  Yes  Who is the provider or what is the name of the office in which the patient attends annual eye exams? [facility] is not established with a provider, would they like to be referred to a provider to establish care? No .   Dental Screening: Recommended annual dental exams for proper oral hygiene  Community Resource Referral / Chronic Care Management: CRR required this visit?  No   CCM required this visit?  No      Plan:     I have personally reviewed and noted the following in the patient's chart:   Medical and social history Use of alcohol, tobacco or illicit drugs  Current medications and supplements including opioid prescriptions. Patient is not currently taking opioid prescriptions. Functional ability and status Nutritional status Physical activity Advanced directives List of  other physicians Hospitalizations, surgeries, and ER visits in previous 12 months Vitals Screenings to include cognitive, depression, and falls Referrals and appointments  In addition, I have reviewed and discussed with patient certain preventive protocols, quality metrics, and best practice recommendations. A  written personalized care plan for preventive services as well as general preventive health recommendations were provided to patient.     Roger Shelter, LPN   [id number]   Nurse Notes: pt pleasantly states she doing well: has no concerns and/or questions at this time.

## 2022-09-13 NOTE — Patient Instructions (Signed)
Christine Roberts , Thank you for taking time to come for your Medicare Wellness Visit. I appreciate your ongoing commitment to your health goals. Please review the following plan we discussed and let me know if I can assist you in the future.   These are the goals we discussed:  Goals      DIET - INCREASE WATER INTAKE     Recommend to drink at least 6-8 8oz glasses of water per day      Monitor and Manage My Blood Sugar-Diabetes Type 2     Timeframe:  Long-Range Goal Priority:  High Start Date: 12/06/2020                            Expected End Date: 06/07/2022                      Follow Up within 90 days   - check blood sugar at prescribed times - check blood sugar if I feel it is too high or too low - take the blood sugar log to all doctor visits    Why is this important?   Checking your blood sugar at home helps to keep it from getting very high or very low.  Writing the results in a diary or log helps the doctor know how to care for you.  Your blood sugar log should have the time, date and the results.  Also, write down the amount of insulin or other medicine that you take.  Other information, like what you ate, exercise done and how you were feeling, will also be helpful.     Notes:      Weight (lb) < 200 lb (90.7 kg)     Pt would like to lose 10-20 lbs this year.         This is a list of the screening recommended for you and due dates:  Health Maintenance  Topic Date Due   Eye exam for diabetics  04/02/2019   COVID-19 Vaccine (6 - 2023-24 season) 07/18/2022   Zoster (Shingles) Vaccine (1 of 2) 09/16/2022*   Complete foot exam   10/23/2022   Mammogram  11/01/2022   Hemoglobin A1C  12/18/2022   Yearly kidney health urinalysis for diabetes  02/20/2023   Colon Cancer Screening  03/05/2023   Yearly kidney function blood test for diabetes  04/19/2023   Medicare Annual Wellness Visit  09/13/2023   DTaP/Tdap/Td vaccine (3 - Td or Tdap) 03/14/2028   Pneumonia Vaccine   Completed   Flu Shot  Completed   DEXA scan (bone density measurement)  Completed   Hepatitis C Screening: USPSTF Recommendation to screen - Ages 68-79 yo.  Completed   HPV Vaccine  Aged Out  *Topic was postponed. The date shown is not the original due date.    Advanced directives: yes  Conditions/risks identified: none  Next appointment: Follow up in one year for your annual wellness visit 09/19/2023 '@3pm'$  in person   Preventive Care 65 Years and Older, Female Preventive care refers to lifestyle choices and visits with your health care provider that can promote health and wellness. What does preventive care include? A yearly physical exam. This is also called an annual well check. Dental exams once or twice a year. Routine eye exams. Ask your health care provider how often you should have your eyes checked. Personal lifestyle choices, including: Daily care of your teeth and gums. Regular physical activity.  Eating a healthy diet. Avoiding tobacco and drug use. Limiting alcohol use. Practicing safe sex. Taking low-dose aspirin every day. Taking vitamin and mineral supplements as recommended by your health care provider. What happens during an annual well check? The services and screenings done by your health care provider during your annual well check will depend on your age, overall health, lifestyle risk factors, and family history of disease. Counseling  Your health care provider may ask you questions about your: Alcohol use. Tobacco use. Drug use. Emotional well-being. Home and relationship well-being. Sexual activity. Eating habits. History of falls. Memory and ability to understand (cognition). Work and work Statistician. Reproductive health. Screening  You may have the following tests or measurements: Height, weight, and BMI. Blood pressure. Lipid and cholesterol levels. These may be checked every 5 years, or more frequently if you are over 14 years old. Skin  check. Lung cancer screening. You may have this screening every year starting at age 50 if you have a 30-pack-year history of smoking and currently smoke or have quit within the past 15 years. Fecal occult blood test (FOBT) of the stool. You may have this test every year starting at age 54. Flexible sigmoidoscopy or colonoscopy. You may have a sigmoidoscopy every 5 years or a colonoscopy every 10 years starting at age 55. Hepatitis C blood test. Hepatitis B blood test. Sexually transmitted disease (STD) testing. Diabetes screening. This is done by checking your blood sugar (glucose) after you have not eaten for a while (fasting). You may have this done every 1-3 years. Bone density scan. This is done to screen for osteoporosis. You may have this done starting at age 73. Mammogram. This may be done every 1-2 years. Talk to your health care provider about how often you should have regular mammograms. Talk with your health care provider about your test results, treatment options, and if necessary, the need for more tests. Vaccines  Your health care provider may recommend certain vaccines, such as: Influenza vaccine. This is recommended every year. Tetanus, diphtheria, and acellular pertussis (Tdap, Td) vaccine. You may need a Td booster every 10 years. Zoster vaccine. You may need this after age 58. Pneumococcal 13-valent conjugate (PCV13) vaccine. One dose is recommended after age 61. Pneumococcal polysaccharide (PPSV23) vaccine. One dose is recommended after age 54. Talk to your health care provider about which screenings and vaccines you need and how often you need them. This information is not intended to replace advice given to you by your health care provider. Make sure you discuss any questions you have with your health care provider. Document Released: 07/21/2015 Document Revised: 03/13/2016 Document Reviewed: 04/25/2015 Elsevier Interactive Patient Education  2017 [employer]  Prevention in the Home Falls can cause injuries. They can happen to people of all ages. There are many things you can do to make your home safe and to help prevent falls. What can I do on the outside of my home? Regularly fix the edges of walkways and driveways and fix any cracks. Remove anything that might make you trip as you walk through a door, such as a raised step or threshold. Trim any bushes or trees on the path to your home. Use bright outdoor lighting. Clear any walking paths of anything that might make someone trip, such as rocks or tools. Regularly check to see if handrails are loose or broken. Make sure that both sides of any steps have handrails. Any raised decks and porches should have guardrails on the  edges. Have any leaves, snow, or ice cleared regularly. Use sand or salt on walking paths during winter. Clean up any spills in your garage right away. This includes oil or grease spills. What can I do in the bathroom? Use night lights. Install grab bars by the toilet and in the tub and shower. Do not use towel bars as grab bars. Use non-skid mats or decals in the tub or shower. If you need to sit down in the shower, use a plastic, non-slip stool. Keep the floor dry. Clean up any water that spills on the floor as soon as it happens. Remove soap buildup in the tub or shower regularly. Attach bath mats securely with double-sided non-slip rug tape. Do not have throw rugs and other things on the floor that can make you trip. What can I do in the bedroom? Use night lights. Make sure that you have a light by your bed that is easy to reach. Do not use any sheets or blankets that are too big for your bed. They should not hang down onto the floor. Have a firm chair that has side arms. You can use this for support while you get dressed. Do not have throw rugs and other things on the floor that can make you trip. What can I do in the kitchen? Clean up any spills right away. Avoid  walking on wet floors. Keep items that you use a lot in easy-to-reach places. If you need to reach something above you, use a strong step stool that has a grab bar. Keep electrical cords out of the way. Do not use floor polish or wax that makes floors slippery. If you must use wax, use non-skid floor wax. Do not have throw rugs and other things on the floor that can make you trip. What can I do with my stairs? Do not leave any items on the stairs. Make sure that there are handrails on both sides of the stairs and use them. Fix handrails that are broken or loose. Make sure that handrails are as long as the stairways. Check any carpeting to make sure that it is firmly attached to the stairs. Fix any carpet that is loose or worn. Avoid having throw rugs at the top or bottom of the stairs. If you do have throw rugs, attach them to the floor with carpet tape. Make sure that you have a light switch at the top of the stairs and the bottom of the stairs. If you do not have them, ask someone to add them for you. What else can I do to help prevent falls? Wear shoes that: Do not have high heels. Have rubber bottoms. Are comfortable and fit you well. Are closed at the toe. Do not wear sandals. If you use a stepladder: Make sure that it is fully opened. Do not climb a closed stepladder. Make sure that both sides of the stepladder are locked into place. Ask someone to hold it for you, if possible. Clearly mark and make sure that you can see: Any grab bars or handrails. First and last steps. Where the edge of each step is. Use tools that help you move around (mobility aids) if they are needed. These include: Canes. Walkers. Scooters. Crutches. Turn on the lights when you go into a dark area. Replace any light bulbs as soon as they burn out. Set up your furniture so you have a clear path. Avoid moving your furniture around. If any of your floors are uneven, fix  them. If there are any pets around  you, be aware of where they are. Review your medicines with your doctor. Some medicines can make you feel dizzy. This can increase your chance of falling. Ask your doctor what other things that you can do to help prevent falls. This information is not intended to replace advice given to you by your health care provider. Make sure you discuss any questions you have with your health care provider. Document Released: 04/20/2009 Document Revised: 11/30/2015 Document Reviewed: 07/29/2014 Elsevier Interactive Patient Education  2017 [employer].

## 2022-09-17 ENCOUNTER — Encounter: Payer: Self-pay | Admitting: Family Medicine

## 2022-09-17 ENCOUNTER — Ambulatory Visit (INDEPENDENT_AMBULATORY_CARE_PROVIDER_SITE_OTHER): Payer: Medicare HMO | Admitting: Family Medicine

## 2022-09-17 VITALS — BP 120/78 | HR 104 | Temp 98.8°F | Resp 16 | Ht 64.0 in | Wt 165.1 lb

## 2022-09-17 DIAGNOSIS — J302 Other seasonal allergic rhinitis: Secondary | ICD-10-CM

## 2022-09-17 DIAGNOSIS — N183 Chronic kidney disease, stage 3 unspecified: Secondary | ICD-10-CM | POA: Diagnosis not present

## 2022-09-17 DIAGNOSIS — R0602 Shortness of breath: Secondary | ICD-10-CM

## 2022-09-17 DIAGNOSIS — R000 Tachycardia, unspecified: Secondary | ICD-10-CM | POA: Diagnosis not present

## 2022-09-17 DIAGNOSIS — J069 Acute upper respiratory infection, unspecified: Secondary | ICD-10-CM

## 2022-09-17 DIAGNOSIS — J209 Acute bronchitis, unspecified: Secondary | ICD-10-CM

## 2022-09-17 DIAGNOSIS — J101 Influenza due to other identified influenza virus with other respiratory manifestations: Secondary | ICD-10-CM | POA: Diagnosis not present

## 2022-09-17 LAB — POCT INFLUENZA A/B
Influenza A, POC: POSITIVE — AB
Influenza B, POC: NEGATIVE

## 2022-09-17 MED ORDER — GUAIFENESIN ER 600 MG PO TB12: 600.0000 mg | ORAL_TABLET | Freq: Two times a day (BID) | ORAL | 0 refills | Status: DC | PRN

## 2022-09-17 MED ORDER — LORATADINE 10 MG PO TABS: 10.0000 mg | ORAL_TABLET | Freq: Every day | ORAL | 5 refills | Status: DC

## 2022-09-17 MED ORDER — OSELTAMIVIR PHOSPHATE 75 MG PO CAPS: 75.0000 mg | ORAL_CAPSULE | Freq: Once | ORAL | 0 refills | Status: AC

## 2022-09-17 MED ORDER — OSELTAMIVIR PHOSPHATE 30 MG PO CAPS: 30.0000 mg | ORAL_CAPSULE | Freq: Two times a day (BID) | ORAL | 0 refills | Status: AC

## 2022-09-17 MED ORDER — FLUTICASONE PROPIONATE 50 MCG/ACT NA SUSP: 2.0000 | Freq: Every day | NASAL | 6 refills | Status: AC

## 2022-09-17 MED ORDER — OSELTAMIVIR PHOSPHATE 75 MG PO CAPS: 75.0000 mg | ORAL_CAPSULE | Freq: Two times a day (BID) | ORAL | 0 refills | Status: DC

## 2022-09-17 MED ORDER — ALBUTEROL SULFATE HFA 108 (90 BASE) MCG/ACT IN AERS: 2.0000 | INHALATION_SPRAY | Freq: Four times a day (QID) | RESPIRATORY_TRACT | 0 refills | Status: AC | PRN

## 2022-09-17 MED ORDER — BENZONATATE 100 MG PO CAPS: 100.0000 mg | ORAL_CAPSULE | Freq: Three times a day (TID) | ORAL | 0 refills | Status: DC | PRN

## 2022-09-17 MED ORDER — PREDNISONE 20 MG PO TABS: 40.0000 mg | ORAL_TABLET | Freq: Every day | ORAL | 0 refills | Status: AC

## 2022-09-17 NOTE — Patient Instructions (Addendum)
Recommend for your current symptoms that for the next 1-2 weeks you start and continue a allergy pill daily at bedtime, a steroid nasal spray (saline spray can also be helpful), mucinex over the counter. For cough you can try the tessalon perles sent in and can also try delsym or robitussin.  Start the steroid - prednisone tomorrow.  Take with food in your stomach and take in the morning.  They can irritate your stomach so you may want to take some pepcid or prilosec if you get burning or indigestion.  It will increase your blood sugar temporarily but that should subside a few days after you've completed the steroids.  You can use the inhaler as needed for when you have chest tightness, wheeze, shortness of breath or coughing fits.    Across the street at out patient imaging please get the chest x-ray done  WE will call you with the lab results as we get them and let you know if there is any confirmed diagnosis or need for additional treatment (like an antiviral or antibiotic).  For now rest, push fluids, use tylenol and ibuprofen as needed for aches and pains and the additional steps above for managing your symptoms.  If it is a viral illness it should be self-limiting and run its course in time..  sometimes that is a few days, unfortunately sometimes that is a couple weeks.   If you have any severe shortness of breath not improved with the inhaler, you have new or worsening chest pain, any confusion or near passing out episodes - then call 911 or go to the ER.   Upper Respiratory Infection, Adult An upper respiratory infection (URI) affects the nose, throat, and upper airways that lead to the lungs. The most common type of URI is often called the common cold. URIs usually get better on their own, without medical treatment. What are the causes? A URI is caused by a germ (virus). You may catch these germs by: Breathing in droplets from an infected person's cough or sneeze. Touching something  that has the germ on it (is contaminated) and then touching your mouth, nose, or eyes. What increases the risk? You are more likely to get a URI if: You are very young or very old. You have close contact with others, such as at work, school, or a health care facility. You smoke. You have long-term (chronic) heart or lung disease. You have a weakened disease-fighting system (immune system). You have nasal allergies or asthma. You have a lot of stress. You have poor nutrition. What are the signs or symptoms? Runny or stuffy (congested) nose. Cough. Sneezing. Sore throat. Headache. Feeling tired (fatigue). Fever. Not wanting to eat as much as usual. Pain in your forehead, behind your eyes, and over your cheekbones (sinus pain). Muscle aches. Redness or irritation of the eyes. Pressure in the ears or face. How is this treated? URIs usually get better on their own within 7-10 days. Medicines cannot cure URIs, but your doctor may recommend certain medicines to help relieve symptoms, such as: Over-the-counter cold medicines. Medicines to reduce coughing (cough suppressants). Coughing is a type of defense against infection that helps to clear the nose, throat, windpipe, and lungs (respiratory system). Take these medicines only as told by your doctor. Medicines to lower your fever. Follow these instructions at home: Activity Rest as needed. If you have a fever, stay home from work or school until your fever is gone, or until your doctor says you may  return to work or school. You should stay home until you cannot spread the infection anymore (you are not contagious). Your doctor may have you wear a face mask so you have less risk of spreading the infection. Relieving symptoms Rinse your mouth often with salt water. To make salt water, dissolve -1 tsp (3-6 g) of salt in 1 cup (237 mL) of warm water. Use a cool-mist humidifier to add moisture to the air. This can help you breathe more  easily. Eating and drinking  Drink enough fluid to keep your pee (urine) pale yellow. Eat soups and other clear broths. General instructions  Take over-the-counter and prescription medicines only as told by your doctor. Do not smoke or use any products that contain nicotine or tobacco. If you need help quitting, ask your doctor. Avoid being where people are smoking (avoid secondhand smoke). Stay up to date on all your shots (immunizations), and get the flu shot every year. Keep all follow-up visits. How to prevent the spread of infection to others  Wash your hands with soap and water for at least 20 seconds. If you cannot use soap and water, use hand sanitizer. Avoid touching your mouth, face, eyes, or nose. Cough or sneeze into a tissue or your sleeve or elbow. Do not cough or sneeze into your hand or into the air. Contact a doctor if: You are getting worse, not better. You have any of these: A fever or chills. [place] or red mucus in your nose. Yellow or Altic fluid (discharge)coming from your nose. Pain in your face, especially when you bend forward. Swollen neck glands. Pain when you swallow. White areas in the back of your throat. Get help right away if: You have shortness of breath that gets worse. You have very bad or constant: Headache. Ear pain. Pain in your forehead, behind your eyes, and over your cheekbones (sinus pain). Chest pain. You have long-lasting (chronic) lung disease along with any of these: Making high-pitched whistling sounds when you breathe, most often when you breathe out (wheezing). Long-lasting cough (more than 14 days). Coughing up blood. A change in your usual mucus. You have a stiff neck. You have changes in your: Vision. Hearing. Thinking. Mood. These symptoms may be an emergency. Get help right away. Call 911. Do not wait to see if the symptoms will go away. Do not drive yourself to the hospital. Summary An upper respiratory infection  (URI) is caused by a germ (virus). The most common type of URI is often called the common cold. URIs usually get better within 7-10 days. Take over-the-counter and prescription medicines only as told by your doctor. This information is not intended to replace advice given to you by your health care provider. Make sure you discuss any questions you have with your health care provider. Document Revised: 01/24/2021 Document Reviewed: 01/24/2021 Elsevier Patient Education  [employer].

## 2022-09-17 NOTE — Progress Notes (Signed)
Patient ID: Christine Roberts, female    DOB: 1946/01/19, 77 y.o.   MRN: [id number]  PCP: Steele Sizer, MD  Chief Complaint  Patient presents with   Cough   Nasal Congestion    Sx since Sunday, has done 2 COVID testing at home were Negative, pt states throat feels sore from so much drainage and facial pressure    Subjective:   Christine Roberts is a 77 y.o. female, presents to clinic with CC of the following:  HPI  Nasal drainage, congestions, post nasal drip with sore throat onset Sunday, worse last night, coughing spells wakign her up last night and feeling like she couldn't breath, she had more productive cough- sputum yellow to green, no blood not Busic   Neg home covid test x 2   No known sick contacts, no sneezing or eye itching irritation Low grade fever Hx of allergies - last night she took a generic allergy pill last night She took some OTC daytime cough meds, no miprovement With coughing fits feels chest is tight, no pleuritic CP when able to breath deep   IDDM  Well controlled on tresiba 16 units and ozempic, sugars were a little high for her today 130  Lab Results  Component Value Date   HGBA1C 6.8 (A) 06/18/2022      Patient Active Problem List   Diagnosis Date Noted   Atherosclerosis of aorta ([place]) 05/25/2018   Seasonal allergic rhinitis 09/12/2015   TIA (transient ischemic attack) 05/24/2015   Senile purpura ([place]) 05/09/2015   Benign essential HTN 03/18/2015   Pain in shoulder 03/18/2015   Dyslipidemia 03/18/2015   History of surgery to major organs, presenting hazards to health 03/18/2015   Hearing loss 03/18/2015   Hiatal hernia 03/18/2015   History of colon polyps 03/18/2015   Calculus of kidney 03/18/2015   B12 deficiency 03/18/2015   Microalbuminuria 03/18/2015   Adult BMI 30+ 03/18/2015   Arthritis, degenerative 03/18/2015   Diabetes mellitus with nephropathy ([place]) 03/18/2015      Current Outpatient Medications:    ACCU-CHEK SOFTCLIX  LANCETS lancets, 1 each by Other route daily., Disp: 100 each, Rfl: 1   acetaminophen (TYLENOL) 650 MG CR tablet, Take 650 mg by mouth every 8 (eight) hours as needed., Disp: , Rfl:    Alcohol Swabs (B-D SINGLE USE SWABS REGULAR) PADS, 1 each by Does not apply route daily., Disp: 400 each, Rfl: 1   amoxicillin (AMOXIL) 500 MG capsule, Take 500 mg by mouth every 6 (six) hours., Disp: , Rfl:    aspirin EC 81 MG tablet, Take 1 tablet (81 mg total) by mouth daily., Disp: 90 tablet, Rfl: 0   atorvastatin (LIPITOR) 40 MG tablet, Take 1 tablet (40 mg total) by mouth 3 (three) times a week. She states still has rx at home, Disp: 36 tablet, Rfl: 1   calcium citrate (CALCITRATE - DOSED IN MG ELEMENTAL CALCIUM) 950 MG tablet, Take 200 mg of elemental calcium by mouth daily. Has D3 in it as well, Disp: , Rfl:    Cholecalciferol (VITAMIN D) 125 MCG (5000 UT) CAPS, Take by mouth. Pt taking 3 times per week, Disp: , Rfl:    Coenzyme Q10 (COQ10) 100 MG CAPS, Take 1 capsule by mouth daily., Disp: , Rfl:    dapagliflozin propanediol (FARXIGA) 10 MG TABS tablet, Take 1 tablet (10 mg total) by mouth daily before breakfast. Patient receives via [employer] Patient Assistance., Disp: 90 tablet, Rfl: 3   fluconazole (DIFLUCAN)  150 MG tablet, TAKE 1 TABLET EVERY OTHER DAY AS NEEDED FOR YEAST (3 PER EPISODE), Disp: 12 tablet, Rfl: 3   glucose blood (ACCU-CHEK AVIVA PLUS) test strip, TEST BLOOD SUGAR UP TO FOUR TIMES DAILY AS DIRECTED, Disp: 400 each, Rfl: 0   insulin degludec (TRESIBA FLEXTOUCH) 200 UNIT/ML FlexTouch Pen, Inject 28 Units into the skin daily. Patient receives through [employer] Patient Assistance through Dec 2022, Disp: , Rfl:    Insulin Pen Needle (NOVOFINE) 30G X 8 MM MISC, Inject 10 each into the skin as needed., Disp: 100 each, Rfl: 2   lisinopril (ZESTRIL) 5 MG tablet, Take 1 tablet (5 mg total) by mouth daily., Disp: 90 tablet, Rfl: 1   metFORMIN (GLUCOPHAGE-XR) 750 MG 24 hr tablet, TAKE 1 TABLET EVERY DAY  WITH BREAKFAST, Disp: 90 tablet, Rfl: 3   metroNIDAZOLE (METROCREAM) 0.75 % cream, Apply qd/bid at mid face and nose for rosacea, Disp: 45 g, Rfl: 5   Semaglutide, 1 MG/DOSE, 4 MG/3ML SOPN, Inject 1 mg as directed once a week. Patient receives through [employer] Patient Assistance through Dec 2022, Disp: , Rfl:    VITAMIN B COMPLEX-C PO, Take by mouth., Disp: , Rfl:    Allergies  Allergen Reactions   Latex Rash   Erythromycin Other (See Comments)    Strawberry tongue     Social History   Tobacco Use   Smoking status: Former    Packs/day: 0.50    Years: 10.00    Total pack years: 5.00    Types: Cigarettes    Passive exposure: Past   Smokeless tobacco: Never   Tobacco comments:    smoking cessation materials not required.  Quit 30 years ago.  Vaping Use   Vaping Use: Never used  Substance Use Topics   Alcohol use: Yes    Alcohol/week: 1.0 standard drink of alcohol    Types: 1 Shots of liquor per week    Comment: socially   Drug use: No      Chart Review Today: I personally reviewed active problem list, medication list, allergies, family history, social history, health maintenance, notes from last encounter, lab results, imaging with the patient/caregiver today.   Review of Systems  Constitutional: Negative.   HENT: Negative.    Eyes: Negative.   Respiratory: Negative.    Cardiovascular: Negative.   Gastrointestinal: Negative.   Endocrine: Negative.   Genitourinary: Negative.   Musculoskeletal: Negative.   Skin: Negative.   Allergic/Immunologic: Negative.   Neurological: Negative.   Hematological: Negative.   Psychiatric/Behavioral: Negative.    All other systems reviewed and are negative.      Objective:   Vitals:   09/17/22 1045 09/17/22 1051  BP: 120/78   Pulse: (!) 124 (!) 111  Resp: 16   Temp: 98.8 F (37.1 C)   TempSrc: Oral   SpO2: 97%   Weight: 165 lb 1.6 oz (74.9 kg)   Height: '5\' 4"'$  (1.626 m)     Body mass index is 28.34 kg/m.     09/17/22 1100  Lap 1 (250 feet)  HR 119  02 Sat 96  Lap 2 (250 feet)  HR 123  02 Sat 97  Lap 3 (250 feet)  HR 122  02 Sat 97     Physical Exam Vitals and nursing note reviewed.  Constitutional:      General: She is not in acute distress.    Appearance: Normal appearance. She is well-developed and well-groomed. She is not ill-appearing, toxic-appearing or diaphoretic.  HENT:     Head: Normocephalic and atraumatic.     Right Ear: Hearing, tympanic membrane, ear canal and external ear normal. There is no impacted cerumen.     Left Ear: Hearing, tympanic membrane, ear canal and external ear normal. There is no impacted cerumen.     Nose: Mucosal edema, congestion and rhinorrhea (profuse) present. Rhinorrhea is clear.     Right Turbinates: Enlarged and pale.     Left Turbinates: Enlarged and pale.     Right Sinus: No maxillary sinus tenderness or frontal sinus tenderness.     Left Sinus: No maxillary sinus tenderness or frontal sinus tenderness.     Mouth/Throat:     Mouth: Mucous membranes are not pale and dry.     Pharynx: Oropharynx is clear. Uvula midline. No pharyngeal swelling, oropharyngeal exudate, posterior oropharyngeal erythema or uvula swelling.     Tonsils: No tonsillar exudate or tonsillar abscesses.  Eyes:     General:        Right eye: No discharge.        Left eye: No discharge.     Conjunctiva/sclera: Conjunctivae normal.     Pupils: Pupils are equal, round, and reactive to light.  Neck:     Trachea: No tracheal deviation.  Cardiovascular:     Rate and Rhythm: Regular rhythm. Tachycardia present.     Pulses: Normal pulses.     Heart sounds: Normal heart sounds. No murmur heard.    No friction rub. No gallop.  Pulmonary:     Effort: Pulmonary effort is normal. No respiratory distress.     Breath sounds: No stridor. No wheezing, rhonchi or rales.  Abdominal:     General: Bowel sounds are normal. There is no distension.     Palpations: Abdomen is soft.   Musculoskeletal:     Cervical back: Normal range of motion and neck supple.  Lymphadenopathy:     Head:     Right side of head: Submandibular and tonsillar adenopathy present.     Left side of head: Submandibular and tonsillar adenopathy present.     Cervical: No cervical adenopathy.  Skin:    General: Skin is warm and dry.     Capillary Refill: Capillary refill takes less than 2 seconds.     Coloration: Skin is not cyanotic, jaundiced, mottled or pale.     Findings: No erythema or rash.  Neurological:     Mental Status: She is alert.     Motor: No abnormal muscle tone.     Coordination: Coordination normal.     Gait: Gait normal.  Psychiatric:        Mood and Affect: Mood normal.        Behavior: Behavior normal. Behavior is cooperative.      Results for orders placed or performed in visit on 06/18/22  POCT HgB A1C  Result Value Ref Range   Hemoglobin A1C 6.8 (A) 4.0 - 5.6 %   HbA1c POC (<> result, manual entry)     HbA1c, POC (prediabetic range)     HbA1c, POC (controlled diabetic range)         Assessment & Plan:   1. Upper respiratory tract infection, unspecified type 2 d onset URI sx with no known sick contacts   With her hx of recent lung/viral infections and her current tachycardia we decided to test for etiology - after all swabs had been obtained the POC flu was positive - other tests will still run  -  Respiratory virus panel - Novel Coronavirus, NAA ([facility]) - DG Chest 2 View; Future - guaiFENesin (MUCINEX) 600 MG 12 hr tablet; Take 1 tablet (600 mg total) by mouth 2 (two) times daily as needed for cough or to loosen phlegm.  Dispense: 30 tablet; Refill: 0 - benzonatate (TESSALON) 100 MG capsule; Take 1 capsule (100 mg total) by mouth 3 (three) times daily as needed for cough.  Dispense: 30 capsule; Refill: 0 - POCT Influenza A/B  2. Shortness of breath No increased WOB in office, but last night pt had SOB with coughing fits worse when laying down No drop  in SpO2 here in clinic, lungs clear but CXR ordered anyway  - Respiratory virus panel - Novel Coronavirus, NAA ([facility]) - predniSONE (DELTASONE) 20 MG tablet; Take 2 tablets (40 mg total) by mouth daily with breakfast for 5 days.  Dispense: 10 tablet; Refill: 0 - albuterol (VENTOLIN HFA) 108 (90 Base) MCG/ACT inhaler; Inhale 2 puffs into the lungs every 6 (six) hours as needed for wheezing or shortness of breath.  Dispense: 8 g; Refill: 0 - DG Chest 2 View; Future - oseltamivir (TAMIFLU) 75 MG capsule; Take 1 capsule (75 mg total) by mouth once for 1 dose.  Dispense: 1 capsule; Refill: 0 - oseltamivir (TAMIFLU) 30 MG capsule; Take 1 capsule (30 mg total) by mouth 2 (two) times daily for 4 days.  Dispense: 8 capsule; Refill: 0  3. Tachycardia Tachy at rest, ambulatory pulse oximetry reassuring - no drop in O2, no distress Push fluids, rest at home, flu A + tamiflu sent in - Respiratory virus panel - Novel Coronavirus, NAA ([facility]) - DG Chest 2 View; Future - oseltamivir (TAMIFLU) 75 MG capsule; Take 1 capsule (75 mg total) by mouth once for 1 dose.  Dispense: 1 capsule; Refill: 0 - oseltamivir (TAMIFLU) 30 MG capsule; Take 1 capsule (30 mg total) by mouth 2 (two) times daily for 4 days.  Dispense: 8 capsule; Refill: 0  4. Seasonal allergic rhinitis, unspecified trigger Edema, boggy pale turbinates with profuse clear discharge, no sinus ttp- tx with antihistamines and nose sprays - loratadine (CLARITIN) 10 MG tablet; Take 1 tablet (10 mg total) by mouth at bedtime.  Dispense: 30 tablet; Refill: 5 - fluticasone (FLONASE) 50 MCG/ACT nasal spray; Place 2 sprays into both nostrils daily.  Dispense: 16 g; Refill: 6  5. Acute bronchitis, unspecified organism Coughing fits, chest tightness, on exam lungs CTA A&P Recent CAP/covid infection, with some remote hx of bronchitis - predniSONE (DELTASONE) 20 MG tablet; Take 2 tablets (40 mg total) by mouth daily with breakfast for 5 days.  Dispense: 10  tablet; Refill: 0 - albuterol (VENTOLIN HFA) 108 (90 Base) MCG/ACT inhaler; Inhale 2 puffs into the lungs every 6 (six) hours as needed for wheezing or shortness of breath.  Dispense: 8 g; Refill: 0 - DG Chest 2 View; Future - guaiFENesin (MUCINEX) 600 MG 12 hr tablet; Take 1 tablet (600 mg total) by mouth 2 (two) times daily as needed for cough or to loosen phlegm.  Dispense: 30 tablet; Refill: 0 - benzonatate (TESSALON) 100 MG capsule; Take 1 capsule (100 mg total) by mouth 3 (three) times daily as needed for cough.  Dispense: 30 capsule; Refill: 0  6. Influenza A Renal dosing on tamiflu - discussed with pt - ER precautions reviewed including worsening tachycardia with any near syncope, CP, SOB not improved with inhaler use - oseltamivir (TAMIFLU) 75 MG capsule; Take 1 capsule (75 mg total) by mouth once  for 1 dose.  Dispense: 1 capsule; Refill: 0 - oseltamivir (TAMIFLU) 30 MG capsule; Take 1 capsule (30 mg total) by mouth 2 (two) times daily for 4 days.  Dispense: 8 capsule; Refill: 0  7. Stage 3 chronic kidney disease, unspecified whether stage 3a or 3b CKD (HCC) Renal dosing - lower dose per last eGFR - oseltamivir (TAMIFLU) 75 MG capsule; Take 1 capsule (75 mg total) by mouth once for 1 dose.  Dispense: 1 capsule; Refill: 0 - oseltamivir (TAMIFLU) 30 MG capsule; Take 1 capsule (30 mg total) by mouth 2 (two) times daily for 4 days.  Dispense: 8 capsule; Refill: 0  Return in about 1 week (around 09/24/2022), or if symptoms worsen or fail to improve.     Delsa Grana, PA-C 09/17/22 11:15 AM

## 2022-09-18 LAB — NOVEL CORONAVIRUS, NAA: SARS-CoV-2, NAA: NOT DETECTED

## 2022-09-19 LAB — RESPIRATORY VIRUS PANEL
Adenovirus B: NOT DETECTED
HUMAN PARAINFLU VIRUS 1: NOT DETECTED
HUMAN PARAINFLU VIRUS 2: NOT DETECTED
HUMAN PARAINFLU VIRUS 3: NOT DETECTED
INFLUENZA A SUBTYPE H1: DETECTED — AB
INFLUENZA A SUBTYPE H3: NOT DETECTED
Influenza A: DETECTED — AB
Influenza B: NOT DETECTED
Metapneumovirus: NOT DETECTED
Respiratory Syncytial Virus A: NOT DETECTED
Respiratory Syncytial Virus B: NOT DETECTED
Rhinovirus: NOT DETECTED

## 2022-10-09 ENCOUNTER — Other Ambulatory Visit: Payer: Self-pay

## 2022-10-09 MED ORDER — TRUE METRIX AIR GLUCOSE METER W/DEVICE KIT: 1.0000 | PACK | Freq: Once | 0 refills | Status: AC

## 2022-10-14 ENCOUNTER — Other Ambulatory Visit: Payer: Self-pay

## 2022-10-14 ENCOUNTER — Telehealth: Payer: Self-pay | Admitting: Family Medicine

## 2022-10-14 DIAGNOSIS — E1159 Type 2 diabetes mellitus with other circulatory complications: Secondary | ICD-10-CM

## 2022-10-14 MED ORDER — BLOOD GLUCOSE TEST VI STRP: 1.0000 | ORAL_STRIP | Freq: Three times a day (TID) | 0 refills | Status: AC

## 2022-10-14 NOTE — Telephone Encounter (Signed)
Sent to [facility]

## 2022-10-14 NOTE — Telephone Encounter (Unsigned)
Copied from CRM [id number]. Topic: General - Other >> Oct 14, 2022 12:50 PM Everette C wrote: Reason for CRM: Medication Refill - Medication: Blood Glucose Monitoring Suppl (TRUE METRIX AIR GLUCOSE METER) testing strips    Has the patient contacted their pharmacy? Yes.   (Agent: If no, request that the patient contact the pharmacy for the refill. If patient does not wish to contact the pharmacy document the reason why and proceed with request.) (Agent: If yes, when and what did the pharmacy advise?)  Preferred Pharmacy (with phone number or street name): [facility] - [place], [place] - [address] [place] [address] Phone: [phone] Fax: [phone] Hours: Not open 24 hours   Has the patient been seen for an appointment in the last year OR does the patient have an upcoming appointment? Yes.    Agent: Please be advised that RX refills may take up to 3 business days. We ask that you follow-up with your pharmacy.

## 2022-10-17 NOTE — Progress Notes (Signed)
Name: Christine Roberts   MRN: [id number]    DOB: 06/26/1946   Date:10/18/2022       Progress Note  Subjective  Chief Complaint  Follow Up  HPI  DMII: Currently taking Farxiga,  Metformin and also on Tresiba 16 units per day . Glucose has been 90's-130's    . She has dyslipidemia and obesity, neuropathy, nephropathy and HTN . Taking statins and ACE. She noticed yeast vaginitis on farxiga,she take diflucan prn. A1C today is at goal at 6.8 %. Discussed going down on Tresiba, she states she prefers stopping Metformin for now .   Tremors: she states she has noticed some intermittent tremors on her hands, also had head bobbing during a dentist appointment, no balance problems. Recently noticed intermittent sharp pain on right posterior nuchal - about 5 episodes in the past week. No neuro deficit. She will check glucose when her hands shake, discussed nortriptyline but she wants to see if resolves by itself   HTN : compliant with medication,  no chest pain or palpitation she is down on lisinopril from 10 mg to 5 mg and bp is still low, we will decrease to 2.5 mg dose now    Hyperlipidemia:. Last LDL went up from 68 to 101. She is now taking atorvastatin three times a week , previously taking it only twice a week because of cramps We will recheck level next visit    TIA versus Complicated migraine: she developed dizziness, headache and aphasia  on 05/24/2015- [facility], she had normal Echo, CT, MRI of brain and carotid dopplers.  She was seen by neurologist and advised to continue on aspirin and lipitor in case it was a TIA, but likely a complicated migraine. Unchanged   B12 deficiency: taking it daily now. She still has intermittent tingling on toes it may be secondary to diabetic neuropathy. We will recheck next visit   Nephrolithiasis: seen by Urologist and no intervention required. No recent episodes   Senile purpura:  skin very thin, stable and reassurance given     Atherosclerosis of aorta: continue statin therapy and aspirin 81 mg daily , discussed needs to get LDL lower than 70, she is now taking it more times per week. Recheck labs next  visit   Grade III hemorrhoids; seen by Dr. Norma Fredricksonoledo - GI and was referred to general surgeon for possible banding, but she wants to wait until her next colonoscopy in 2024 , she wants to be under anesthesia .  Patient Active Problem List   Diagnosis Date Noted   Atherosclerosis of aorta 05/25/2018   Seasonal allergic rhinitis 09/12/2015   TIA (transient ischemic attack) 05/24/2015   Senile purpura (HCC) 05/09/2015   Benign essential HTN 03/18/2015   Pain in shoulder 03/18/2015   Dyslipidemia 03/18/2015   History of surgery to major organs, presenting hazards to health 03/18/2015   Hearing loss 03/18/2015   Hiatal hernia 03/18/2015   History of colon polyps 03/18/2015   Calculus of kidney 03/18/2015   B12 deficiency 03/18/2015   Microalbuminuria 03/18/2015   Adult BMI 30+ 03/18/2015   Arthritis, degenerative 03/18/2015   Diabetes mellitus with nephropathy 03/18/2015    Past Surgical History:  Procedure Laterality Date   ABDOMINAL HYSTERECTOMY     CHOLECYSTECTOMY     COLONOSCOPY  2015   COLONOSCOPY WITH PROPOFOL N/A 03/04/2018   Procedure: COLONOSCOPY WITH PROPOFOL;  Surgeon: Toledo, Boykin Nearingeodoro K, MD;  Location: [facility] ENDOSCOPY;  Service: Gastroenterology;  Laterality: N/A;  cystoscopic     stent placed in ureter   LITHOTRIPSY  12/2007   MYRINGOTOMY Right 11/2009    Family History  Problem Relation Age of Onset   Cancer Mother    Diabetes Mother    Diabetes Daughter    CAD Father    Diabetes Daughter        hypoglycemia    Social History   Tobacco Use   Smoking status: Former    Packs/day: 0.50    Years: 10.00    Additional pack years: 0.00    Total pack years: 5.00    Types: Cigarettes    Passive exposure: Past   Smokeless tobacco: Never   Tobacco comments:    smoking cessation  materials not required.  Quit 30 years ago.  Substance Use Topics   Alcohol use: Yes    Alcohol/week: 1.0 standard drink of alcohol    Types: 1 Shots of liquor per week    Comment: socially     Current Outpatient Medications:    ACCU-CHEK SOFTCLIX LANCETS lancets, 1 each by Other route daily., Disp: 100 each, Rfl: 1   acetaminophen (TYLENOL) 650 MG CR tablet, Take 650 mg by mouth every 8 (eight) hours as needed., Disp: , Rfl:    albuterol (VENTOLIN HFA) 108 (90 Base) MCG/ACT inhaler, Inhale 2 puffs into the lungs every 6 (six) hours as needed for wheezing or shortness of breath., Disp: 8 g, Rfl: 0   Alcohol Swabs (B-D SINGLE USE SWABS REGULAR) PADS, 1 each by Does not apply route daily., Disp: 400 each, Rfl: 1   aspirin EC 81 MG tablet, Take 1 tablet (81 mg total) by mouth daily., Disp: 90 tablet, Rfl: 0   atorvastatin (LIPITOR) 40 MG tablet, Take 1 tablet (40 mg total) by mouth 3 (three) times a week. She states still has rx at home, Disp: 36 tablet, Rfl: 1   calcium citrate (CALCITRATE - DOSED IN MG ELEMENTAL CALCIUM) 950 MG tablet, Take 200 mg of elemental calcium by mouth daily. Has D3 in it as well, Disp: , Rfl:    Cholecalciferol (VITAMIN D) 125 MCG (5000 UT) CAPS, Take by mouth. Pt taking 3 times per week, Disp: , Rfl:    Coenzyme Q10 (COQ10) 100 MG CAPS, Take 1 capsule by mouth daily., Disp: , Rfl:    dapagliflozin propanediol (FARXIGA) 10 MG TABS tablet, Take 1 tablet (10 mg total) by mouth daily before breakfast. Patient receives via [employer] Patient Assistance., Disp: 90 tablet, Rfl: 3   fluconazole (DIFLUCAN) 150 MG tablet, TAKE 1 TABLET EVERY OTHER DAY AS NEEDED FOR YEAST (3 PER EPISODE), Disp: 12 tablet, Rfl: 3   fluticasone (FLONASE) 50 MCG/ACT nasal spray, Place 2 sprays into both nostrils daily., Disp: 16 g, Rfl: 6   Glucose Blood (BLOOD GLUCOSE TEST STRIPS) STRP, 1 each by In Vitro route in the morning, at noon, and at bedtime. True  Metrix Air glucose meter testing strips, Disp:  200 strip, Rfl: 0   insulin degludec (TRESIBA FLEXTOUCH) 200 UNIT/ML FlexTouch Pen, Inject 28 Units into the skin daily. Patient receives through [employer] Patient Assistance through Dec 2022, Disp: , Rfl:    Insulin Pen Needle (NOVOFINE) 30G X 8 MM MISC, Inject 10 each into the skin as needed., Disp: 100 each, Rfl: 2   loratadine (CLARITIN) 10 MG tablet, Take 1 tablet (10 mg total) by mouth at bedtime., Disp: 30 tablet, Rfl: 5   metroNIDAZOLE (METROCREAM) 0.75 % cream, Apply qd/bid at mid face  and nose for rosacea, Disp: 45 g, Rfl: 5   Semaglutide, 2 MG/DOSE, 8 MG/3ML SOPN, Inject 2 mg as directed once a week., Disp: 9 mL, Rfl: 0   VITAMIN B COMPLEX-C PO, Take by mouth., Disp: , Rfl:    Blood Glucose Monitoring Suppl (TRUE METRIX AIR GLUCOSE METER) w/Device KIT, 1 each by Does not apply route once for 1 dose., Disp: 1 kit, Rfl: 0   lisinopril (ZESTRIL) 2.5 MG tablet, Take 1 tablet (2.5 mg total) by mouth daily., Disp: 90 tablet, Rfl: 0  Allergies  Allergen Reactions   Latex Rash   Erythromycin Other (See Comments)    Strawberry tongue    I personally reviewed active problem list, medication list, allergies, family history, social history, health maintenance with the patient/caregiver today.   ROS  Constitutional: Negative for fever or weight change.  Respiratory: Negative for cough and shortness of breath.   Cardiovascular: Negative for chest pain or palpitations.  Gastrointestinal: Negative for abdominal pain, no bowel changes.  Musculoskeletal: Negative for gait problem or joint swelling.  Skin: Negative for rash.  Neurological: Negative for dizziness or headache.  No other specific complaints in a complete review of systems (except as listed in HPI above).   Objective  Vitals:   10/18/22 1344  BP: 116/72  Pulse: 96  Resp: 14  Temp: 97.9 F (36.6 C)  TempSrc: Oral  SpO2: 100%  Weight: 165 lb 8 oz (75.1 kg)  Height: 5\' 4"  (1.626 m)    Body mass index is 28.41  kg/m.  Physical Exam  Constitutional: Patient appears well-developed and well-nourished.  No distress.  HEENT: head atraumatic, normocephalic, pupils equal and reactive to light, neck supple Cardiovascular: Normal rate, regular rhythm and normal heart sounds.  No murmur heard. No BLE edema. Pulmonary/Chest: Effort normal and breath sounds normal. No respiratory distress. Abdominal: Soft.  There is no tenderness. Psychiatric: Patient has a normal mood and affect. behavior is normal. Judgment and thought content normal.   Recent Results (from the past 2160 hour(s))  Novel Coronavirus, NAA ([facility])     Status: None   Collection Time: 09/17/22 12:00 AM   Specimen: Nasopharyngeal(NP) swabs in vial transport medium   Nasopharynge  Resident  Result Value Ref Range   SARS-CoV-2, NAA Not Detected Not Detected    Comment: This nucleic acid amplification test was developed and its performance characteristics determined by [employer]. Nucleic acid amplification tests include RT-PCR and TMA. This test has not been FDA cleared or approved. This test has been authorized by FDA under an Emergency Use Authorization (EUA). This test is only authorized for the duration of time the declaration that circumstances exist justifying the authorization of the emergency use of in vitro diagnostic tests for detection of SARS-CoV-2 virus and/or diagnosis of COVID-19 infection under section 564(b)(1) of the Act, 21 U.S.C. 161WRU-0(A) (1), unless the authorization is terminated or revoked sooner. When diagnostic testing is negative, the possibility of a false negative result should be considered in the context of a patient's recent exposures and the presence of clinical signs and symptoms consistent with COVID-19. An individual without symptoms of COVID-19 and who is not shedding SARS-CoV-2 virus wo uld expect to have a negative (not detected) result in this assay.   POCT Influenza A/B     Status:  Abnormal   Collection Time: 09/17/22 11:47 AM  Result Value Ref Range   Influenza A, POC Positive (A) Negative   Influenza B, POC Negative Negative  Respiratory virus  panel     Status: Abnormal   Collection Time: 09/17/22 12:18 PM   Specimen: Respiratory  Result Value Ref Range   Adenovirus B Not Detected Not Detected   Rhinovirus Not Detected Not Detected   Influenza A Detected (A) Not Detected   INFLUENZA A SUBTYPE H1 Detected (A) Not Detected   INFLUENZA A SUBTYPE H3 Not Detected Not Detected   Influenza B Not Detected Not Detected   Metapneumovirus Not Detected Not Detected   Respiratory Syncytial Virus A Not Detected Not Detected   Respiratory Syncytial Virus B Not Detected Not Detected   HUMAN PARAINFLU VIRUS 1 Not Detected Not Detected   HUMAN PARAINFLU VIRUS 2 Not Detected Not Detected   HUMAN PARAINFLU VIRUS 3 Not Detected Not Detected   Comment see note     Comment: THIS ASSAY WILL NOT DETECT SARS-CoV-2 (COVID-19) . This test is performed using the [employer]. . . Limitations: A negative result does not rule out respiratory pathogen infection below the sensitivity limit of the assay. The sensitivity depends on pathogen and sample type. This assay cannot reliably distinguish between Rhinovirus and Enterovirus due to genetic similarities between the viruses. Marland Kitchen   POCT HgB A1C     Status: Abnormal   Collection Time: 10/18/22  1:52 PM  Result Value Ref Range   Hemoglobin A1C 6.8 (A) 4.0 - 5.6 %   HbA1c POC (<> result, manual entry)     HbA1c, POC (prediabetic range)     HbA1c, POC (controlled diabetic range)      Diabetic Foot Exam: Diabetic Foot Exam - Simple   Simple Foot Form Visual Inspection No deformities, no ulcerations, no other skin breakdown bilaterally: Yes Sensation Testing Intact to touch and monofilament testing bilaterally: Yes Pulse Check Posterior Tibialis and Dorsalis pulse intact bilaterally: Yes Comments       PHQ2/9:    10/18/2022    1:51 PM 09/17/2022   10:45 AM 09/13/2022    3:38 PM 06/18/2022    2:02 PM 04/26/2022   11:04 AM  Depression screen PHQ 2/9  Decreased Interest 0 0 0 0 0  Down, Depressed, Hopeless 0 0 0 0 0  PHQ - 2 Score 0 0 0 0 0  Altered sleeping 0 0  0 0  Tired, decreased energy 0 0  0 0  Change in appetite 0 0  0 0  Feeling bad or failure about yourself  0 0  0 0  Trouble concentrating 0 0  0 0  Moving slowly or fidgety/restless 0 0  0 0  Suicidal thoughts 0 0  0 0  PHQ-9 Score 0 0  0 0  Difficult doing work/chores  Not difficult at all   Not difficult at all    phq 9 is negative   Fall Risk:    10/18/2022    1:51 PM 09/17/2022   10:45 AM 09/13/2022    3:26 PM 06/18/2022    2:02 PM 04/26/2022   11:04 AM  Fall Risk   Falls in the past year? 0 0 0 0 0  Number falls in past yr:  0 0  0  Injury with Fall?  0 0  0  Risk for fall due to : No Fall Risks No Fall Risks No Fall Risks No Fall Risks No Fall Risks  Follow up Falls prevention discussed Falls prevention discussed;Education provided;Falls evaluation completed Falls prevention discussed;Education provided Falls prevention discussed;Education provided;Falls evaluation completed Falls prevention discussed;Education provided  Functional Status Survey: Is the patient deaf or have difficulty hearing?: No Does the patient have difficulty seeing, even when wearing glasses/contacts?: No Does the patient have difficulty concentrating, remembering, or making decisions?: No Does the patient have difficulty walking or climbing stairs?: No Does the patient have difficulty dressing or bathing?: No Does the patient have difficulty doing errands alone such as visiting a doctor's office or shopping?: No    Assessment & Plan   1. Type 2 diabetes with nephropathy  - POCT HgB A1C - HM Diabetes Foot Exam - lisinopril (ZESTRIL) 2.5 MG tablet; Take 1 tablet (2.5 mg total) by mouth daily.  Dispense: 90 tablet;  Refill: 0 - Semaglutide, 2 MG/DOSE, 8 MG/3ML SOPN; Inject 2 mg as directed once a week.  Dispense: 9 mL; Refill: 0  2. Benign essential HTN  - lisinopril (ZESTRIL) 2.5 MG tablet; Take 1 tablet (2.5 mg total) by mouth daily.  Dispense: 90 tablet; Refill: 0  3. Hx-TIA (transient ischemic attack)  On statin and aspirin  4. Senile purpura  Stable  5. Stage 3a chronic kidney disease  On SGL-2 agonist   6. Atherosclerosis of aorta  On statin therapy   7. Hypertension associated with type 2 diabetes mellitus  Bp is low, go down on dose of lisinopril  8. Vitamin D deficiency  Continue supplemnetation  9. B12 deficiency  Continue supplementation    10. Pain in neck  She wants to monitor for now   11. Tremors of nervous system   She will check glucose during episodes and if low back down on tresiba level by 2 units, we will stop metformin today

## 2022-10-18 ENCOUNTER — Ambulatory Visit (INDEPENDENT_AMBULATORY_CARE_PROVIDER_SITE_OTHER): Payer: Medicare HMO | Admitting: Family Medicine

## 2022-10-18 ENCOUNTER — Encounter: Payer: Self-pay | Admitting: Family Medicine

## 2022-10-18 VITALS — BP 116/72 | HR 96 | Temp 97.9°F | Resp 14 | Ht 64.0 in | Wt 165.5 lb

## 2022-10-18 DIAGNOSIS — I10 Essential (primary) hypertension: Secondary | ICD-10-CM | POA: Diagnosis not present

## 2022-10-18 DIAGNOSIS — E1121 Type 2 diabetes mellitus with diabetic nephropathy: Secondary | ICD-10-CM

## 2022-10-18 DIAGNOSIS — E559 Vitamin D deficiency, unspecified: Secondary | ICD-10-CM | POA: Diagnosis not present

## 2022-10-18 DIAGNOSIS — I700 Atherosclerosis of aorta: Secondary | ICD-10-CM | POA: Diagnosis not present

## 2022-10-18 DIAGNOSIS — Z8673 Personal history of transient ischemic attack (TIA), and cerebral infarction without residual deficits: Secondary | ICD-10-CM

## 2022-10-18 DIAGNOSIS — R251 Tremor, unspecified: Secondary | ICD-10-CM

## 2022-10-18 DIAGNOSIS — N1831 Chronic kidney disease, stage 3a: Secondary | ICD-10-CM | POA: Diagnosis not present

## 2022-10-18 DIAGNOSIS — E1159 Type 2 diabetes mellitus with other circulatory complications: Secondary | ICD-10-CM

## 2022-10-18 DIAGNOSIS — I152 Hypertension secondary to endocrine disorders: Secondary | ICD-10-CM

## 2022-10-18 DIAGNOSIS — E538 Deficiency of other specified B group vitamins: Secondary | ICD-10-CM | POA: Diagnosis not present

## 2022-10-18 DIAGNOSIS — D692 Other nonthrombocytopenic purpura: Secondary | ICD-10-CM | POA: Diagnosis not present

## 2022-10-18 DIAGNOSIS — M542 Cervicalgia: Secondary | ICD-10-CM

## 2022-10-18 LAB — POCT GLYCOSYLATED HEMOGLOBIN (HGB A1C): Hemoglobin A1C: 6.8 % — AB (ref 4.0–5.6)

## 2022-10-18 MED ORDER — LISINOPRIL 2.5 MG PO TABS: 2.5000 mg | ORAL_TABLET | Freq: Every day | ORAL | 0 refills | Status: DC

## 2022-10-18 MED ORDER — SEMAGLUTIDE (2 MG/DOSE) 8 MG/3ML ~~LOC~~ SOPN: 2.0000 mg | PEN_INJECTOR | SUBCUTANEOUS | 0 refills | Status: DC

## 2022-11-07 ENCOUNTER — Telehealth: Payer: Self-pay

## 2022-11-07 NOTE — Progress Notes (Signed)
Care Management & Coordination Services Pharmacy Team  Reason for Encounter: Hypertension  Contacted patient to discuss hypertension disease state.  Spoke with patient on 11/07/2022     Current antihypertensive regimen:  Lisinopril 2.5 MG 1 tablet daily- Patient states she take it night which seems to be more helpful for her.   Patient verbally confirms she is taking the above medications as directed. Yes  Patient states her dizziness improved, and denies any headaches.  How often are you checking your Blood Pressure? infrequently  she checks her blood pressure in the morning before taking her medication.  Current home BP readings:  Patient states she has only check her blood pressure a couple of times since she had a follow up with her PCP. Patient reports her blood pressure was 110/68, and 120/70.Patient reports her blood pressure was ranging low, and her PCP adjusted her Lisinopril to 2.5 mg daily.Patient states she feels her blood pressure is doing better then before.  Wrist or arm cuff: Patient reports she uses a arm cuff.   Caffeine intake: Patient reports she has a cup of caffeinated coffee daily in the mornings, and may have a glass of soda "here and there" throughout the day. Patient states she does not drink a lot of soda as she is aware it is not good for her, but drinks plenty of water daily.  Salt intake: Patient reports she has never been big on salt, but she will add light salt while cooking to have flavor in her meals.  OTC medications including pseudoephedrine or NSAIDs? Patient states she takes Aspirin 81 mg daily.   Any readings above 180/100? No Patient reports her blood pressure usually ranges low or in range.  What recent interventions/DTPs have been made by any provider to improve Blood Pressure control since last CPP Visit:  10/18/2022 Dr. Carlynn Purl MD (PCP) Decrease Lisinopril to 2.5 mg daily  Any recent hospitalizations or ED visits since last visit with  CPP? No  What diet changes have been made to improve Blood Pressure Control?  Patient denies any changes recently.  What exercise is being done to improve your Blood Pressure Control?  Patient states she has been doing a lot of yard work. Patient reports she loves walking,but has not started yet due to all the pollen as she is dealing with her allergies.Patient states once the pollen clears up she going to began walking daily. Patient states she was thinking about going to the gym, but reports she enjoys walking outside more.  Patient gave verbal consent to schedule a Telephone follow up appointment with Julious Payer, [facility] on : 02/12/2023 at 2:00 pm.  Patient denies any questions or concerns for Julious Payer, [facility]. Patient is aware to return my call if she has any question/concerns that may arise.  Adherence Review: Is the patient currently on ACE/ARB medication? Yes Does the patient have >5 day gap between last estimated fill dates? No   Care Gaps: COVID 19 Vaccine (4- Booster for [employer]) Ophthalmology Exam (Last Completed 04/01/2018) Mammogram   Star Rating Drugs: Atorvastatin 40 mg last filled 08/22/2022 for 84 day supply at [facility] Patient is now taking Lisinopril 2.5 mg, but Lisinopril 5 mg last filled 09/04/2022 for 90 day at [facility] 2 mg  (Receives  from [employer]) Evaristo Bury 28 units daily (Receives from [employer]) Marcelline Deist 10 mg (Receives from [employer])  Chart Updates: Recent office visits:  10/18/2022 Dr. Carlynn Purl MD (PCP) Decrease Lisinopril to 2.5 mg daily, Stop  Metformin 09/17/2022 Danelle Berry PA-C (PCP Office) Start Mucinex , start Benzonatate 100 mg PRN, Start Prednisone  40 mg for 5 days, start Albuterol  108 MCG/ACT PRN, Start Tamiflu 30 mg for 4 days, start Flonase 50 MCG/ACT twice daily, Return in about 1 week  09/13/2022 Albertine Grates LPN (PCP Office) Medicare wellness completed, no medication changes noted.  06/18/2022 Dr.  Carlynn Purl MD (PCP) Decrease Lisinopril to 5 mg daily, Labwork completed -A1C- 6.8%,Return in about 4 months  Recent consult visits:  08/26/2022 Dr. Jayme Cloud MD (Pulmonary) No Medication changes noted 07/22/2022 Dr. Roseanne Reno MD (Dermatology) No medication changes noted, Return in about 6 months  06/24/2022 Dr. Jayme Cloud MD (Pulmonary) No Medication changes noted, follow-up in 2 to 3 months   Hospital visits:  None in previous 6 months  Medications: Outpatient Encounter Medications as of 11/07/2022  Medication Sig   ACCU-CHEK SOFTCLIX LANCETS lancets 1 each by Other route daily.   acetaminophen (TYLENOL) 650 MG CR tablet Take 650 mg by mouth every 8 (eight) hours as needed.   albuterol (VENTOLIN HFA) 108 (90 Base) MCG/ACT inhaler Inhale 2 puffs into the lungs every 6 (six) hours as needed for wheezing or shortness of breath.   Alcohol Swabs (B-D SINGLE USE SWABS REGULAR) PADS 1 each by Does not apply route daily.   aspirin EC 81 MG tablet Take 1 tablet (81 mg total) by mouth daily.   atorvastatin (LIPITOR) 40 MG tablet Take 1 tablet (40 mg total) by mouth 3 (three) times a week. She states still has rx at home   Blood Glucose Monitoring Suppl (TRUE METRIX AIR GLUCOSE METER) w/Device KIT 1 each by Does not apply route once for 1 dose.   calcium citrate (CALCITRATE - DOSED IN MG ELEMENTAL CALCIUM) 950 MG tablet Take 200 mg of elemental calcium by mouth daily. Has D3 in it as well   Cholecalciferol (VITAMIN D) 125 MCG (5000 UT) CAPS Take by mouth. Pt taking 3 times per week   Coenzyme Q10 (COQ10) 100 MG CAPS Take 1 capsule by mouth daily.   dapagliflozin propanediol (FARXIGA) 10 MG TABS tablet Take 1 tablet (10 mg total) by mouth daily before breakfast. Patient receives via [employer] Patient Assistance.   fluconazole (DIFLUCAN) 150 MG tablet TAKE 1 TABLET EVERY OTHER DAY AS NEEDED FOR YEAST (3 PER EPISODE)   fluticasone (FLONASE) 50 MCG/ACT nasal spray Place 2 sprays into both nostrils daily.   Glucose  Blood (BLOOD GLUCOSE TEST STRIPS) STRP 1 each by In Vitro route in the morning, at noon, and at bedtime. True  Metrix Air glucose meter testing strips   insulin degludec (TRESIBA FLEXTOUCH) 200 UNIT/ML FlexTouch Pen Inject 28 Units into the skin daily. Patient receives through [employer] Patient Assistance through Dec 2022   Insulin Pen Needle (NOVOFINE) 30G X 8 MM MISC Inject 10 each into the skin as needed.   lisinopril (ZESTRIL) 2.5 MG tablet Take 1 tablet (2.5 mg total) by mouth daily.   loratadine (CLARITIN) 10 MG tablet Take 1 tablet (10 mg total) by mouth at bedtime.   metroNIDAZOLE (METROCREAM) 0.75 % cream Apply qd/bid at mid face and nose for rosacea   Semaglutide, 2 MG/DOSE, 8 MG/3ML SOPN Inject 2 mg as directed once a week.   VITAMIN B COMPLEX-C PO Take by mouth.   No facility-administered encounter medications on file as of 11/07/2022.    Recent Office Vitals: BP Readings from Last 3 Encounters:  10/18/22 116/72  09/17/22 120/78  09/13/22 112/68  Pulse Readings from Last 3 Encounters:  10/18/22 96  09/17/22 (!) 104  08/26/22 71    Wt Readings from Last 3 Encounters:  10/18/22 165 lb 8 oz (75.1 kg)  09/17/22 165 lb 1.6 oz (74.9 kg)  09/13/22 167 lb 3.2 oz (75.8 kg)     Kidney Function Lab Results  Component Value Date/Time   CREATININE 1.26 (H) 04/18/2022 02:40 PM   CREATININE 0.87 02/19/2022 10:44 AM   CREATININE 0.77 06/21/2021 12:08 PM   CREATININE 0.78 02/12/2021 02:34 PM   GFRNONAA 44 (L) 04/18/2022 02:40 PM   GFRNONAA 59 (L) 02/09/2020 11:17 AM   GFRAA 69 02/09/2020 11:17 AM       Latest Ref Rng & Units 04/18/2022    2:40 PM 02/19/2022   10:44 AM 06/21/2021   12:08 PM  BMP  Glucose 70 - 99 mg/dL 161  096  045   BUN 8 - 23 mg/dL 22  10  11    Creatinine 0.44 - 1.00 mg/dL 4.09  8.11  9.14   BUN/Creat Ratio 6 - 22 (calc)  SEE NOTE:  14   Sodium 135 - 145 mmol/L 136  140  140   Potassium 3.5 - 5.1 mmol/L 4.2  4.4  4.4   Chloride 98 - 111 mmol/L 102   104  100   CO2 22 - 32 mmol/L 22  23  24    Calcium 8.9 - 10.3 mg/dL 9.0  9.5  9.4      [facility] Clinical Pharmacist Assistant [phone]

## 2022-11-11 ENCOUNTER — Other Ambulatory Visit: Payer: Self-pay | Admitting: Family Medicine

## 2022-11-11 DIAGNOSIS — E785 Hyperlipidemia, unspecified: Secondary | ICD-10-CM

## 2022-11-12 ENCOUNTER — Other Ambulatory Visit: Payer: Self-pay | Admitting: Family Medicine

## 2022-11-12 MED ORDER — METFORMIN HCL ER 750 MG PO TB24: 750.0000 mg | ORAL_TABLET | Freq: Every day | ORAL | 0 refills | Status: DC

## 2022-11-13 ENCOUNTER — Telehealth: Payer: Self-pay

## 2022-11-13 NOTE — Progress Notes (Signed)
Per Clinical pharmacist,[name] was discontinued, can you call [employer] and let them know?   I reach out to [employer] as requested from the Clinical pharmacist. Per [employer], they will discontinue Farxgia, and patient should no longer receive shipment.  Everlean Cherry Clinical Pharmacist Assistant [phone]

## 2022-12-11 ENCOUNTER — Other Ambulatory Visit: Payer: Self-pay | Admitting: Family Medicine

## 2023-01-06 ENCOUNTER — Ambulatory Visit (INDEPENDENT_AMBULATORY_CARE_PROVIDER_SITE_OTHER): Payer: Medicare HMO | Admitting: Physician Assistant

## 2023-01-06 ENCOUNTER — Ambulatory Visit: Payer: Self-pay

## 2023-01-06 ENCOUNTER — Encounter: Payer: Self-pay | Admitting: Physician Assistant

## 2023-01-06 VITALS — BP 119/71 | HR 89 | Temp 98.8°F | Wt 163.4 lb

## 2023-01-06 DIAGNOSIS — S80811A Abrasion, right lower leg, initial encounter: Secondary | ICD-10-CM | POA: Diagnosis not present

## 2023-01-06 DIAGNOSIS — Z23 Encounter for immunization: Secondary | ICD-10-CM

## 2023-01-06 MED ORDER — CEPHALEXIN 500 MG PO CAPS
500.0000 mg | ORAL_CAPSULE | Freq: Four times a day (QID) | ORAL | 0 refills | Status: AC
Start: 2023-01-06 — End: 2023-01-11

## 2023-01-06 NOTE — Telephone Encounter (Signed)
  Chief Complaint: Scrape on shine from fall into ditch Symptoms: Scrape, bruising Frequency: Thursday Pertinent Negatives: Patient denies  Disposition: [] ED /[] Urgent Care (no appt availability in office) / [x] Appointment(In office/virtual)/ []  [place] [facility]/ [] Home Care/ [] Refused Recommended Disposition /[] [place] [facility]/ []  Follow-up with PCP Additional Notes: Pt fell in the rain into a ditch filled with dirty water and scraped her shin, she also has bruising. No appts at [facility]. Pt will see Erin Mecum at [facility] today.   Reason for Disposition  [1] Last tetanus shot > 5 years ago AND [2] DIRTY cut or scrape  Answer Assessment - Initial Assessment Questions 1. APPEARANCE of INJURY: "What does the injury look like?"      Scrape on shin  3. BLEEDING: "Is it bleeding now?" If Yes, ask: "Is it difficult to stop?"      no 4. LOCATION: "Where is the injury located?"      Shin 5. ONSET: "How long ago did the injury occur?"      Thursday night 6. MECHANISM: "Tell me how it happened."      [place] into a ditch 7. TETANUS: "When was the last tetanus booster?"     Unsure, but greater than 5 years  Protocols used: Skin Injury-A-AH

## 2023-01-06 NOTE — Telephone Encounter (Signed)
Going to [employer]

## 2023-01-06 NOTE — Progress Notes (Unsigned)
Acute Office Visit   Patient: Christine Roberts   DOB: 19-Feb-1946   77 y.o. Female  MRN: [id number] Visit Date: 01/06/2023  Today's healthcare provider: Oswaldo Conroy Quatisha Zylka, PA-C  Introduced myself to the patient as a Secondary school teacher and provided education on APPs in clinical practice.    Chief Complaint  Patient presents with   Abrasion    Pt states she scrapped her leg this past Thursday. States she stepped in a hole that had some debris and dirty water in it. States she has been keeping the area wrapped up. Last tetanus was in 2019.    Subjective    HPI HPI     Abrasion    Additional comments: Pt states she scrapped her leg this past Thursday. States she stepped in a hole that had some debris and dirty water in it. States she has been keeping the area wrapped up. Last tetanus was in 2019.       Last edited by Pablo Ledger, CMA on 01/06/2023  3:18 PM.        She reports on Thurs evening she fell into a ditch  She denies hitting her head during fall She reports her left ankle is a bit swollen and tender and she has an abrasion on her right shin She states she is concerned for the abrasions and wound on her shin   Medications: Outpatient Medications Prior to Visit  Medication Sig   ACCU-CHEK SOFTCLIX LANCETS lancets 1 each by Other route daily.   acetaminophen (TYLENOL) 650 MG CR tablet Take 650 mg by mouth every 8 (eight) hours as needed.   albuterol (VENTOLIN HFA) 108 (90 Base) MCG/ACT inhaler Inhale 2 puffs into the lungs every 6 (six) hours as needed for wheezing or shortness of breath.   Alcohol Swabs (B-D SINGLE USE SWABS REGULAR) PADS 1 each by Does not apply route daily.   aspirin EC 81 MG tablet Take 1 tablet (81 mg total) by mouth daily.   atorvastatin (LIPITOR) 40 MG tablet TAKE 1 TABLET THREE TIMES WEEKLY   calcium citrate (CALCITRATE - DOSED IN MG ELEMENTAL CALCIUM) 950 MG tablet Take 200 mg of elemental calcium by mouth daily. Has D3 in it as well   Cholecalciferol  (VITAMIN D) 125 MCG (5000 UT) CAPS Take by mouth. Pt taking 3 times per week   Coenzyme Q10 (COQ10) 100 MG CAPS Take 1 capsule by mouth daily.   fluticasone (FLONASE) 50 MCG/ACT nasal spray Place 2 sprays into both nostrils daily.   glucose blood (TRUE METRIX BLOOD GLUCOSE TEST) test strip TEST BLOOD SUGAR IN THE MORNING, AT NOON, AND AT BEDTIME   insulin degludec (TRESIBA FLEXTOUCH) 200 UNIT/ML FlexTouch Pen Inject 28 Units into the skin daily. Patient receives through [employer] Patient Assistance through Dec 2022 (Patient taking differently: Inject 14 Units into the skin daily. Patient receives through [employer] Patient Assistance through Dec 2022)   Insulin Pen Needle (NOVOFINE) 30G X 8 MM MISC Inject 10 each into the skin as needed.   lisinopril (ZESTRIL) 2.5 MG tablet Take 1 tablet (2.5 mg total) by mouth daily.   loratadine (CLARITIN) 10 MG tablet Take 1 tablet (10 mg total) by mouth at bedtime.   metFORMIN (GLUCOPHAGE-XR) 750 MG 24 hr tablet Take 1 tablet (750 mg total) by mouth daily with breakfast.   metroNIDAZOLE (METROCREAM) 0.75 % cream Apply qd/bid at mid face and nose for rosacea   Semaglutide, 2 MG/DOSE,  8 MG/3ML SOPN Inject 2 mg as directed once a week.   VITAMIN B COMPLEX-C PO Take by mouth.   [DISCONTINUED] fluconazole (DIFLUCAN) 150 MG tablet TAKE 1 TABLET EVERY OTHER DAY AS NEEDED FOR YEAST (3 PER EPISODE)   Blood Glucose Monitoring Suppl (TRUE METRIX AIR GLUCOSE METER) w/Device KIT 1 each by Does not apply route once for 1 dose.   No facility-administered medications prior to visit.    Review of Systems  Skin:        Abrasion      {Labs  Heme  Chem  Endocrine  Serology  Results Review (optional):23779}   Objective    BP 119/71   Pulse 89   Temp 98.8 F (37.1 C) (Oral)   Wt 163 lb 6.4 oz (74.1 kg)   SpO2 98%   BMI 28.05 kg/m  {Show previous vital signs (optional):23777}  Physical Exam Vitals reviewed.  Constitutional:      General: She is awake.      Appearance: Normal appearance. She is well-developed and well-groomed.  HENT:     Head: Normocephalic and atraumatic.  Pulmonary:     Effort: Pulmonary effort is normal.  Skin:    General: Skin is warm and dry.     Findings: Abrasion, bruising and wound present.     Comments: Two blister type lesions on her right shin are present Lower blister appears to have serosanguinous fluid Upper blister appears to have more serous/blood inside - area is dry and nontender, no extra warmth to the area   Neurological:     Mental Status: She is alert.  Psychiatric:        Behavior: Behavior is cooperative.       No results found for any visits on 01/06/23.  Assessment & Plan      No follow-ups on file.     Problem List Items Addressed This Visit   None Visit Diagnoses     Abrasion of anterior right lower leg, initial encounter    -  Primary   Relevant Medications   cephALEXin (KEFLEX) 500 MG capsule   Other Relevant Orders   Tdap vaccine greater than or equal to 7yo IM        No follow-ups on file.   I, Mandee Pluta E Alica Shellhammer, PA-C, have reviewed all documentation for this visit. The documentation on 01/06/23 for the exam, diagnosis, procedures, and orders are all accurate and complete.   Jacquelin Hawking, MHS, PA-C [facility]

## 2023-01-23 ENCOUNTER — Ambulatory Visit (INDEPENDENT_AMBULATORY_CARE_PROVIDER_SITE_OTHER): Payer: Medicare HMO | Admitting: Dermatology

## 2023-01-23 VITALS — BP 116/78 | HR 72

## 2023-01-23 DIAGNOSIS — W908XXA Exposure to other nonionizing radiation, initial encounter: Secondary | ICD-10-CM

## 2023-01-23 DIAGNOSIS — L080 Pyoderma: Secondary | ICD-10-CM

## 2023-01-23 DIAGNOSIS — L814 Other melanin hyperpigmentation: Secondary | ICD-10-CM | POA: Diagnosis not present

## 2023-01-23 DIAGNOSIS — D2262 Melanocytic nevi of left upper limb, including shoulder: Secondary | ICD-10-CM

## 2023-01-23 DIAGNOSIS — L578 Other skin changes due to chronic exposure to nonionizing radiation: Secondary | ICD-10-CM

## 2023-01-23 DIAGNOSIS — Z1283 Encounter for screening for malignant neoplasm of skin: Secondary | ICD-10-CM | POA: Diagnosis not present

## 2023-01-23 DIAGNOSIS — D229 Melanocytic nevi, unspecified: Secondary | ICD-10-CM

## 2023-01-23 DIAGNOSIS — L570 Actinic keratosis: Secondary | ICD-10-CM | POA: Diagnosis not present

## 2023-01-23 DIAGNOSIS — D2361 Other benign neoplasm of skin of right upper limb, including shoulder: Secondary | ICD-10-CM

## 2023-01-23 DIAGNOSIS — D1801 Hemangioma of skin and subcutaneous tissue: Secondary | ICD-10-CM

## 2023-01-23 DIAGNOSIS — L988 Other specified disorders of the skin and subcutaneous tissue: Secondary | ICD-10-CM

## 2023-01-23 DIAGNOSIS — Z872 Personal history of diseases of the skin and subcutaneous tissue: Secondary | ICD-10-CM

## 2023-01-23 DIAGNOSIS — D225 Melanocytic nevi of trunk: Secondary | ICD-10-CM | POA: Diagnosis not present

## 2023-01-23 DIAGNOSIS — R238 Other skin changes: Secondary | ICD-10-CM

## 2023-01-23 DIAGNOSIS — D492 Neoplasm of unspecified behavior of bone, soft tissue, and skin: Secondary | ICD-10-CM

## 2023-01-23 DIAGNOSIS — D692 Other nonthrombocytopenic purpura: Secondary | ICD-10-CM

## 2023-01-23 DIAGNOSIS — L720 Epidermal cyst: Secondary | ICD-10-CM

## 2023-01-23 DIAGNOSIS — L821 Other seborrheic keratosis: Secondary | ICD-10-CM

## 2023-01-23 DIAGNOSIS — Z85828 Personal history of other malignant neoplasm of skin: Secondary | ICD-10-CM

## 2023-01-23 NOTE — Patient Instructions (Addendum)
Start Over the counter Effaclar gel- apply topically to affected spots at face nightly,     Wound Care Instructions  Cleanse wound gently with soap and water once a day then pat dry with clean gauze. Apply a thin coat of Petrolatum (petroleum jelly, "Vaseline") over the wound (unless you have an allergy to this). We recommend that you use a new, sterile tube of Vaseline. Do not pick or remove scabs. Do not remove the yellow or white "healing tissue" from the base of the wound.  Cover the wound with fresh, clean, nonstick gauze and secure with paper tape. You may use Band-Aids in place of gauze and tape if the wound is small enough, but would recommend trimming much of the tape off as there is often too much. Sometimes Band-Aids can irritate the skin.  You should call the office for your biopsy report after 1 week if you have not already been contacted.  If you experience any problems, such as abnormal amounts of bleeding, swelling, significant bruising, significant pain, or evidence of infection, please call the office immediately.  FOR ADULT SURGERY PATIENTS: If you need something for pain relief you may take 1 extra strength Tylenol (acetaminophen) AND 2 Ibuprofen (200mg  each) together every 4 hours as needed for pain. (do not take these if you are allergic to them or if you have a reason you should not take them.) Typically, you may only need pain medication for 1 to 3 days.        Cryotherapy Aftercare  Wash gently with soap and water everyday.   Apply Vaseline and Band-Aid daily until healed.     Due to recent changes in healthcare laws, you may see results of your pathology and/or laboratory studies on MyChart before the doctors have had a chance to review them. We understand that in some cases there may be results that are confusing or concerning to you. Please understand that not all results are received at the same time and often the doctors may need to interpret multiple results  in order to provide you with the best plan of care or course of treatment. Therefore, we ask that you please give [place] 2 business days to thoroughly review all your results before contacting the office for clarification. Should we see a critical lab result, you will be contacted sooner.   If You Need Anything After Your Visit  If you have any questions or concerns for your doctor, please call our main line at [phone] and press option 4 to reach your doctor's medical assistant. If no one answers, please leave a voicemail as directed and we will return your call as soon as possible. Messages left after 4 pm will be answered the following business day.   You may also send [place] a message via MyChart. We typically respond to MyChart messages within 1-2 business days.  For prescription refills, please ask your pharmacy to contact our office. Our fax number is [phone].  If you have an urgent issue when the clinic is closed that cannot wait until the next business day, you can page your doctor at the number below.    Please note that while we do our best to be available for urgent issues outside of office hours, we are not available 24/7.   If you have an urgent issue and are unable to reach [place], you may choose to seek medical care at your doctor's office, retail clinic, urgent care center, or emergency room.  If you have  a medical emergency, please immediately call 911 or go to the emergency department.  Pager Numbers  - Dr. Gwen Pounds: [phone]  - Dr. Neale Burly: [phone]  - Dr. Roseanne Reno: [phone]  In the event of inclement weather, please call our main line at [phone] for an update on the status of any delays or closures.  Dermatology Medication Tips: Please keep the boxes that topical medications come in in order to help keep track of the instructions about where and how to use these. Pharmacies typically print the medication instructions only on the boxes and not directly on  the medication tubes.   If your medication is too expensive, please contact our office at [phone] option 4 or send [place] a message through MyChart.   We are unable to tell what your co-pay for medications will be in advance as this is different depending on your insurance coverage. However, we may be able to find a substitute medication at lower cost or fill out paperwork to get insurance to cover a needed medication.   If a prior authorization is required to get your medication covered by your insurance company, please allow [place] 1-2 business days to complete this process.  Drug prices often vary depending on where the prescription is filled and some pharmacies may offer cheaper prices.  The website www.goodrx.com contains coupons for medications through different pharmacies. The prices here do not account for what the cost may be with help from insurance (it may be cheaper with your insurance), but the website can give you the price if you did not use any insurance.  - You can print the associated coupon and take it with your prescription to the pharmacy.  - You may also stop by our office during regular business hours and pick up a GoodRx coupon card.  - If you need your prescription sent electronically to a different pharmacy, notify our office through [facility] or by phone at [phone] option 4.     Si Usted Necesita Algo Despus de Su Visita  Tambin puede enviarnos un mensaje a travs de Clinical cytogeneticist. Por lo general respondemos a los mensajes de MyChart en el transcurso de 1 a 2 das hbiles.  Para renovar recetas, por favor pida a su farmacia que se ponga en contacto con nuestra oficina. Annie Sable de fax es [place] [phone].  Si tiene un asunto urgente cuando la clnica est cerrada y que no puede esperar hasta el siguiente da hbil, puede llamar/localizar a su doctor(a) al nmero que aparece a continuacin.   Por favor, tenga en cuenta que aunque hacemos todo lo  posible para estar disponibles para asuntos urgentes fuera del horario de [place], no estamos disponibles las 24 horas del da, los 7 [address]  [address] de la [place].   Si tiene un problema urgente y no puede comunicarse con nosotros, puede optar por buscar atencin mdica  en el consultorio de su doctor(a), en una clnica privada, en un centro de atencin urgente o en una sala de emergencias.  Si tiene Engineer, drilling, por favor llame inmediatamente al 911 o vaya a la sala de emergencias.  Nmeros de bper  - Dr. Gwen Pounds: [phone]  - Dra. Moye: [phone]  - Dra. Roseanne Reno: [phone]  En caso de inclemencias del [place], por favor llame a Lacy Duverney principal al [phone] para una actualizacin sobre el [place] de cualquier retraso o cierre.  Consejos para la medicacin en dermatologa: Por favor, guarde las cajas en las que vienen los medicamentos de uso tpico para  ayudarle a seguir las [employer] dnde y cmo usarlos. Las farmacias generalmente imprimen las instrucciones del medicamento slo en las cajas y no directamente en los tubos del [place].   Si su medicamento es muy caro, por favor, pngase en contacto con Rolm Gala llamando al [phone] y presione la opcin 4 o envenos un mensaje a travs de Clinical cytogeneticist.   No podemos decirle cul ser su copago por los medicamentos por adelantado ya que esto es diferente dependiendo de la cobertura de su seguro. Sin embargo, es posible que podamos encontrar un medicamento sustituto a Audiological scientist un formulario para que el seguro cubra el medicamento que se considera necesario.   Si se requiere una autorizacin previa para que su compaa de seguros [place] su medicamento, por favor permtanos de 1 a 2 das hbiles para completar [address]  Los precios de los medicamentos varan con frecuencia dependiendo del Environmental consultant de dnde se surte la receta y alguna farmacias pueden ofrecer precios ms baratos.  El sitio web  www.goodrx.com tiene cupones para medicamentos de Health and safety inspector. Los precios aqu no tienen en cuenta lo que podra costar con la ayuda del seguro (puede ser ms barato con su seguro), pero el sitio web puede darle el precio si no utiliz Tourist information centre manager.  - Puede imprimir el cupn correspondiente y llevarlo con su receta a la farmacia.  - Tambin puede pasar por nuestra oficina durante el horario de atencin regular y Education officer, museum una tarjeta de cupones de GoodRx.  - Si necesita que su receta se enve electrnicamente a una farmacia diferente, informe a nuestra oficina a travs de MyChart de [place] o por telfono llamando al [phone] y presione la opcin 4.

## 2023-01-23 NOTE — Progress Notes (Signed)
Follow-Up Visit   Subjective  Christine Roberts is a 77 y.o. female who presents for the following: Yearly Skin Cancer Screening and Full Body Skin Exam, hx of SCC, hx of Aks   The patient presents for Total-Body Skin Exam (TBSE) for skin cancer screening and mole check. The patient has spots, moles and lesions to be evaluated, some may be new or changing and the patient may have concern these could be cancer.    The following portions of the chart were reviewed this encounter and updated as appropriate: medications, allergies, medical history  Review of Systems:  No other skin or systemic complaints except as noted in HPI or Assessment and Plan.  Objective  Well appearing patient in no apparent distress; mood and affect are within normal limits.  A full examination was performed including scalp, head, eyes, ears, nose, lips, neck, chest, axillae, abdomen, back, buttocks, bilateral upper extremities, bilateral lower extremities, hands, feet, fingers, toes, fingernails, and toenails. All findings within normal limits unless otherwise noted below.   Relevant physical exam findings are noted in the Assessment and Plan.  right nasal dorsum x 1 Erythematous thin papules/macules with gritty scale.   central upper lip below nose 4.0 mm pink pearly papule          Assessment & Plan   SKIN CANCER SCREENING PERFORMED TODAY.  ACTINIC DAMAGE - Chronic condition, secondary to cumulative UV/sun exposure - diffuse scaly erythematous macules with underlying dyspigmentation - Recommend daily broad spectrum sunscreen SPF 30+ to sun-exposed areas, reapply every 2 hours as needed.  - Staying in the shade or wearing long sleeves, sun glasses (UVA+UVB protection) and wide brim hats (4-inch brim around the entire circumference of the hat) are also recommended for sun protection.  - Call for new or changing lesions.  NEVUS  3.0 mm medium dark macule right spinal mid back  0.5 cm Herrin papule  left upper arm x 2, present for yrs no changes per pt - Benign appearing on exam today - Observation - Call clinic for new or changing moles - Recommend daily use of broad spectrum spf 30+ sunscreen to sun-exposed areas.   LENTIGINES, SEBORRHEIC KERATOSES,  Benign normal skin lesions - Benign-appearing - Call for any changes   HEMANGIOMAS 7 x 5 mm purple papule at the right anterior thigh   3.0 mm purple papule right pretibial   Benign appearing on exam today - Observation - Call clinic for new or changing moles - Recommend daily use of broad spectrum spf 30+ sunscreen to sun-exposed areas.    Purpura - Chronic; persistent and recurrent.  Treatable, but not curable. - Violaceous macules and patches - Benign - Related to trauma, age, sun damage and/or use of blood thinners, chronic use of topical and/or oral steroids - Observe - Can use OTC arnica containing moisturizer such as Dermend Bruise Formula if desired - Call for worsening or other concerns   Blue nevus Exam: R hand dorsum 4.0 mm dark blue gray flat papule    Benign-appearing.  Observation.  Call clinic for new or changing moles.  Recommend daily use of broad spectrum spf 30+ sunscreen to sun-exposed areas.     Fibrous papule of nose vs Telangectasia  Exam: right lower nasal dorsum 2.5 mm blanching pink flat papule   Benign-appearing.  Observation.  Call clinic for new or changing lesions.  Recommend daily use of broad spectrum spf 30+ sunscreen to sun-exposed areas.     BITE REACTION  Exam: 2  cm erythematous patch at the right lower back, itchy per pt, noticed few days ago,was outside working in yard  Benign. Observe. RTC if worsens.  Milia Exam: Chin- numerous firm white papules   Benign, observe.   Treatment Plan:   Discussed Rx tretinoin, pt defers due to cost.  Start OTC Effaclar gel- apply topically to affected spots at face nightly, pt has samples at home she hasn't tried yet    AK (actinic  keratosis) right nasal dorsum x 1  Actinic keratoses are precancerous spots that appear secondary to cumulative UV radiation exposure/sun exposure over time. They are chronic with expected duration over 1 year. A portion of actinic keratoses will progress to squamous cell carcinoma of the skin. It is not possible to reliably predict which spots will progress to skin cancer and so treatment is recommended to prevent development of skin cancer.  Recommend daily broad spectrum sunscreen SPF 30+ to sun-exposed areas, reapply every 2 hours as needed.  Recommend staying in the shade or wearing long sleeves, sun glasses (UVA+UVB protection) and wide brim hats (4-inch brim around the entire circumference of the hat). Call for new or changing lesions.   Destruction of lesion - right nasal dorsum x 1  Destruction method: cryotherapy   Informed consent: discussed and consent obtained   Timeout:  patient name, date of birth, surgical site, and procedure verified Lesion destroyed using liquid nitrogen: Yes   Region frozen until ice ball extended beyond lesion: Yes   Outcome: patient tolerated procedure well with no complications   Post-procedure details: wound care instructions given   Additional details:  Prior to procedure, discussed risks of blister formation, small wound, skin dyspigmentation, or rare scar following cryotherapy. Recommend Vaseline ointment to treated areas while healing.   Neoplasm of skin central upper lip below nose  Skin / nail biopsy Type of biopsy: tangential   Informed consent: discussed and consent obtained   Patient was prepped and draped in usual sterile fashion: area prepped with alochol. Anesthesia: the lesion was anesthetized in a standard fashion   Anesthetic:  1% lidocaine w/ epinephrine 1-100,000 buffered w/ 8.4% NaHCO3 Instrument used: flexible razor blade   Hemostasis achieved with: pressure, aluminum chloride and electrodesiccation   Outcome: patient tolerated  procedure well   Post-procedure details: wound care instructions given   Post-procedure details comment:  Ointment and small bandage  Destruction of lesion  Destruction method: electrodesiccation and curettage   Informed consent: discussed and consent obtained   Timeout:  patient name, date of birth, surgical site, and procedure verified Curettage performed in three different directions: Yes   Electrodesiccation performed over the curetted area: Yes   Final wound size (cm):  0.5 Hemostasis achieved with:  pressure, aluminum chloride and electrodesiccation Outcome: patient tolerated procedure well with no complications   Post-procedure details: wound care instructions given    Specimen 1 - Surgical pathology Differential Diagnosis: Inflammatory papule R/O BCC ED&C today Check Margins: No   Elastosis of skin Exam: Face- infraocular puffiness   Discussed blepharoplasty for puffiness under eyes, discussed filler under eyes, not covered by insurance.   History of SCC (squamous cell carcinoma) of skin 11/12/2021 R distal medial calf- clear on exam   Clear. Observe for recurrence. Call clinic for new or changing lesions.  Recommend regular skin exams, daily broad-spectrum spf 30+ sunscreen use, and photoprotection.      Return in about 1 year (around 01/23/2024) for TBSE, hx of SCC.  Cipriano Bunker, CMA, am acting  as scribe for Willeen Niece, MD .   Documentation: I have reviewed the above documentation for accuracy and completeness, and I agree with the above.  Willeen Niece, MD

## 2023-01-28 ENCOUNTER — Encounter: Payer: Medicare HMO | Admitting: Dermatology

## 2023-02-05 ENCOUNTER — Telehealth: Payer: Self-pay

## 2023-02-05 MED ORDER — MUPIROCIN 2 % EX OINT: 1.0000 | TOPICAL_OINTMENT | Freq: Two times a day (BID) | CUTANEOUS | 0 refills | Status: AC

## 2023-02-05 NOTE — Addendum Note (Signed)
Addended by: Dorathy Daft R on: 02/05/2023 03:29 PM   Modules accepted: Orders

## 2023-02-05 NOTE — Telephone Encounter (Signed)
Left pt msg to call for bx results/sh 

## 2023-02-05 NOTE — Telephone Encounter (Signed)
Patient advised of BX results and RX sent in. aw

## 2023-02-05 NOTE — Telephone Encounter (Signed)
-----   Message from Willeen Niece sent at 02/05/2023 12:56 PM EDT ----- Skin , central upper lip below nose SUPPURATIVE DERMATITIS, ULCERATED  Benign infected ulceration, would start mupirocin ointment bid until healed - please call patient

## 2023-02-07 ENCOUNTER — Encounter: Payer: Self-pay | Admitting: Pharmacist

## 2023-02-07 NOTE — Progress Notes (Signed)
Patient previously followed by [facility] pharmacist. Per clinical review, no pharmacist appointment needed at this time.

## 2023-02-20 NOTE — Progress Notes (Signed)
Name: Christine Roberts   MRN: [id number]    DOB: 01-Dec-1945   Date:02/21/2023       Progress Note  Subjective  Chief Complaint  Follow Up  HPI  DMII: she is on  Metformin, Ozempic 2 mg and down to 14 units of Tresiba, she is unable to tolerate SGL-2 agonist. Glucose has been 90's-115   . She has dyslipidemia and obesity, neuropathy, nephropathy and HTN . Taking statins and ACE.  A1C today is 7 %  . She is due for urine micro and eye exam  Tremors: she states she has noticed some intermittent tremors on her hands, sometimes her head moves - like when at the dentist,  no balance problems. No neuro deficit. She states usually present when she is nervous when she is the center or attention and also when if she needs to seat still for a photo  HTN : compliant with medication,  no chest pain or palpitation she is down on lisinopril 2.5 mg and BP is at goal    Hyperlipidemia:. Last LDL went up from 68 to 101. She is now taking atorvastatin three times a week , previously taking it only twice a week because of cramps We will recheck level next visit    TIA versus Complicated migraine: she developed dizziness, headache and aphasia  on 05/24/2015- she was transported by EMS to [facility], she had normal Echo, CT, MRI of brain and carotid dopplers.  She was seen by neurologist and advised to continue on aspirin and lipitor in case it was a TIA, but likely a complicated migraine. Unchanged   B12 deficiency: taking it daily now. She still has intermittent tingling on toes it may be secondary to diabetic neuropathy.  We will recheck it today   Nephrolithiasis: seen by Urologist and no intervention required. No recent problems   Senile purpura:  skin very thin, stable and reassurance given    Atherosclerosis of aorta: continue statin therapy and aspirin 81 mg daily , discussed needs to get LDL lower than 70, she is now taking it three times a wee, we will recheck labs   Grade III hemorrhoids; seen by Dr. Norma Fredrickson - GI  and was referred to general surgeon for possible banding, referral sent to Dr. Norma Fredrickson to see if he can do the banding procedure   Patient Active Problem List   Diagnosis Date Noted   Atherosclerosis of aorta (HCC) 05/25/2018   Seasonal allergic rhinitis 09/12/2015   TIA (transient ischemic attack) 05/24/2015   Senile purpura (HCC) 05/09/2015   Benign essential HTN 03/18/2015   Pain in shoulder 03/18/2015   Dyslipidemia 03/18/2015   History of surgery to major organs, presenting hazards to health 03/18/2015   Hearing loss 03/18/2015   Hiatal hernia 03/18/2015   History of colon polyps 03/18/2015   Calculus of kidney 03/18/2015   B12 deficiency 03/18/2015   Microalbuminuria 03/18/2015   Adult BMI 30+ 03/18/2015   Arthritis, degenerative 03/18/2015   Diabetes mellitus with nephropathy (HCC) 03/18/2015    Past Surgical History:  Procedure Laterality Date   ABDOMINAL HYSTERECTOMY     CHOLECYSTECTOMY     COLONOSCOPY  2015   COLONOSCOPY WITH PROPOFOL N/A 03/04/2018   Procedure: COLONOSCOPY WITH PROPOFOL;  Surgeon: Toledo, Boykin Nearing, MD;  Location: [facility] ENDOSCOPY;  Service: Gastroenterology;  Laterality: N/A;   cystoscopic     stent placed in ureter   LITHOTRIPSY  12/2007   MYRINGOTOMY Right 11/2009    Family History  Problem Relation  Age of Onset   Cancer Mother    Diabetes Mother    Diabetes Daughter    CAD Father    Diabetes Daughter        hypoglycemia    Social History   Tobacco Use   Smoking status: Former    Current packs/day: 0.50    Average packs/day: 0.5 packs/day for 10.0 years (5.0 ttl pk-yrs)    Types: Cigarettes    Passive exposure: Past   Smokeless tobacco: Never   Tobacco comments:    smoking cessation materials not required.  Quit 30 years ago.  Substance Use Topics   Alcohol use: Yes    Alcohol/week: 1.0 standard drink of alcohol    Types: 1 Shots of liquor per week    Comment: socially     Current Outpatient Medications:    ACCU-CHEK  SOFTCLIX LANCETS lancets, 1 each by Other route daily., Disp: 100 each, Rfl: 1   acetaminophen (TYLENOL) 650 MG CR tablet, Take 650 mg by mouth every 8 (eight) hours as needed., Disp: , Rfl:    albuterol (VENTOLIN HFA) 108 (90 Base) MCG/ACT inhaler, Inhale 2 puffs into the lungs every 6 (six) hours as needed for wheezing or shortness of breath., Disp: 8 g, Rfl: 0   Alcohol Swabs (B-D SINGLE USE SWABS REGULAR) PADS, 1 each by Does not apply route daily., Disp: 400 each, Rfl: 1   aspirin EC 81 MG tablet, Take 1 tablet (81 mg total) by mouth daily., Disp: 90 tablet, Rfl: 0   atorvastatin (LIPITOR) 40 MG tablet, TAKE 1 TABLET THREE TIMES WEEKLY, Disp: 36 tablet, Rfl: 1   calcium citrate (CALCITRATE - DOSED IN MG ELEMENTAL CALCIUM) 950 MG tablet, Take 200 mg of elemental calcium by mouth daily. Has D3 in it as well, Disp: , Rfl:    Cholecalciferol (VITAMIN D) 125 MCG (5000 UT) CAPS, Take by mouth. Pt taking 3 times per week, Disp: , Rfl:    Coenzyme Q10 (COQ10) 100 MG CAPS, Take 1 capsule by mouth daily., Disp: , Rfl:    fluticasone (FLONASE) 50 MCG/ACT nasal spray, Place 2 sprays into both nostrils daily., Disp: 16 g, Rfl: 6   glucose blood (TRUE METRIX BLOOD GLUCOSE TEST) test strip, TEST BLOOD SUGAR IN THE MORNING, AT NOON, AND AT BEDTIME, Disp: 200 strip, Rfl: 0   insulin degludec (TRESIBA FLEXTOUCH) 200 UNIT/ML FlexTouch Pen, Inject 28 Units into the skin daily. Patient receives through [employer] Patient Assistance through Dec 2022 (Patient taking differently: Inject 14 Units into the skin daily. Patient receives through [employer] Patient Assistance through Dec 2022), Disp: , Rfl:    Insulin Pen Needle (NOVOFINE) 30G X 8 MM MISC, Inject 10 each into the skin as needed., Disp: 100 each, Rfl: 2   loratadine (CLARITIN) 10 MG tablet, Take 1 tablet (10 mg total) by mouth at bedtime., Disp: 30 tablet, Rfl: 5   metroNIDAZOLE (METROCREAM) 0.75 % cream, Apply qd/bid at mid face and nose for rosacea, Disp:  45 g, Rfl: 5   mupirocin ointment (BACTROBAN) 2 %, Apply 1 Application topically 2 (two) times daily., Disp: 22 g, Rfl: 0   Semaglutide, 2 MG/DOSE, 8 MG/3ML SOPN, Inject 2 mg as directed once a week., Disp: 9 mL, Rfl: 0   VITAMIN B COMPLEX-C PO, Take by mouth., Disp: , Rfl:    Blood Glucose Monitoring Suppl (TRUE METRIX AIR GLUCOSE METER) w/Device KIT, 1 each by Does not apply route once for 1 dose., Disp: 1 kit, Rfl:  0   lisinopril (ZESTRIL) 2.5 MG tablet, Take 1 tablet (2.5 mg total) by mouth daily., Disp: 90 tablet, Rfl: 1   metFORMIN (GLUCOPHAGE-XR) 750 MG 24 hr tablet, Take 1 tablet (750 mg total) by mouth daily with breakfast., Disp: 90 tablet, Rfl: 1  Allergies  Allergen Reactions   Latex Rash   Erythromycin Other (See Comments)    Strawberry tongue    I personally reviewed active problem list, medication list, allergies, family history, social history, health maintenance with the patient/caregiver today.   ROS  Ten systems reviewed and is negative except as mentioned in HPI    Objective  Vitals:   02/21/23 1334  BP: 120/76  Pulse: 81  Resp: 16  SpO2: 97%  Weight: 159 lb (72.1 kg)  Height: 5\' 4"  (1.626 m)    Body mass index is 27.29 kg/m.  Physical Exam  Constitutional: Patient appears well-developed and well-nourished. No distress.  HEENT: head atraumatic, normocephalic, pupils equal and reactive to light, neck supple, throat within normal limits Cardiovascular: Normal rate, regular rhythm and normal heart sounds.  No murmur heard. No BLE edema. Pulmonary/Chest: Effort normal and breath sounds normal. No respiratory distress. Abdominal: Soft.  There is no tenderness. Psychiatric: Patient has a normal mood and affect. behavior is normal. Judgment and thought content normal.   Recent Results (from the past 2160 hour(s))  POCT HgB A1C     Status: Abnormal   Collection Time: 02/21/23  1:36 PM  Result Value Ref Range   Hemoglobin A1C 7.0 (A) 4.0 - 5.6 %   HbA1c  POC (<> result, manual entry)     HbA1c, POC (prediabetic range)     HbA1c, POC (controlled diabetic range)       PHQ2/9:    02/21/2023    1:35 PM 01/06/2023    3:21 PM 10/18/2022    1:51 PM 09/17/2022   10:45 AM 09/13/2022    3:38 PM  Depression screen PHQ 2/9  Decreased Interest 0 0 0 0 0  Down, Depressed, Hopeless 0 0 0 0 0  PHQ - 2 Score 0 0 0 0 0  Altered sleeping 0 0 0 0   Tired, decreased energy 0 0 0 0   Change in appetite 0 0 0 0   Feeling bad or failure about yourself  0 0 0 0   Trouble concentrating 0 0 0 0   Moving slowly or fidgety/restless 0 0 0 0   Suicidal thoughts 0 0 0 0   PHQ-9 Score 0 0 0 0   Difficult doing work/chores  Not difficult at all  Not difficult at all     phq 9 is negative   Fall Risk:    02/21/2023    1:35 PM 01/06/2023    3:20 PM 10/18/2022    1:51 PM 09/17/2022   10:45 AM 09/13/2022    3:26 PM  Fall Risk   Falls in the past year? 0 0 0 0 0  Number falls in past yr: 0 0  0 0  Injury with Fall? 0 0  0 0  Risk for fall due to : No Fall Risks No Fall Risks No Fall Risks No Fall Risks No Fall Risks  Follow up Falls prevention discussed Falls evaluation completed Falls prevention discussed Falls prevention discussed;Education provided;Falls evaluation completed Falls prevention discussed;Education provided      Functional Status Survey: Is the patient deaf or have difficulty hearing?: No Does the patient have difficulty seeing, even when wearing  glasses/contacts?: No Does the patient have difficulty concentrating, remembering, or making decisions?: No Does the patient have difficulty walking or climbing stairs?: No Does the patient have difficulty dressing or bathing?: No Does the patient have difficulty doing errands alone such as visiting a doctor's office or shopping?: No    Assessment & Plan  1. Type 2 diabetes with nephropathy (HCC)  - POCT HgB A1C - Urine Microalbumin w/creat. ratio - lisinopril (ZESTRIL) 2.5 MG tablet; Take 1  tablet (2.5 mg total) by mouth daily.  Dispense: 90 tablet; Refill: 1  2. Senile purpura (HCC)  Reassurance given   3. Atherosclerosis of aorta (HCC)  - Lipid panel  4. Stage 3a chronic kidney disease (HCC)  - CBC with Differential/Platelet - COMPLETE METABOLIC PANEL WITH GFR  5. Benign essential HTN  - lisinopril (ZESTRIL) 2.5 MG tablet; Take 1 tablet (2.5 mg total) by mouth daily.  Dispense: 90 tablet; Refill: 1  6. Hx-TIA (transient ischemic attack)  - Lipid panel  7. B12 deficiency  - CBC with Differential/Platelet - B12 and Folate Panel  8. Vitamin D deficiency  - VITAMIN D 25 Hydroxy (Vit-D Deficiency, Fractures)  9. Breast cancer screening by mammogram  - MM 3D SCREENING MAMMOGRAM BILATERAL BREAST  10. Colon cancer screening  - Ambulatory referral to Gastroenterology

## 2023-02-21 ENCOUNTER — Ambulatory Visit (INDEPENDENT_AMBULATORY_CARE_PROVIDER_SITE_OTHER): Payer: Medicare HMO | Admitting: Family Medicine

## 2023-02-21 ENCOUNTER — Encounter: Payer: Self-pay | Admitting: Family Medicine

## 2023-02-21 VITALS — BP 120/76 | HR 81 | Resp 16 | Ht 64.0 in | Wt 159.0 lb

## 2023-02-21 DIAGNOSIS — I700 Atherosclerosis of aorta: Secondary | ICD-10-CM | POA: Diagnosis not present

## 2023-02-21 DIAGNOSIS — E559 Vitamin D deficiency, unspecified: Secondary | ICD-10-CM | POA: Diagnosis not present

## 2023-02-21 DIAGNOSIS — E1121 Type 2 diabetes mellitus with diabetic nephropathy: Secondary | ICD-10-CM

## 2023-02-21 DIAGNOSIS — I10 Essential (primary) hypertension: Secondary | ICD-10-CM

## 2023-02-21 DIAGNOSIS — D692 Other nonthrombocytopenic purpura: Secondary | ICD-10-CM | POA: Diagnosis not present

## 2023-02-21 DIAGNOSIS — Z7984 Long term (current) use of oral hypoglycemic drugs: Secondary | ICD-10-CM

## 2023-02-21 DIAGNOSIS — N1831 Chronic kidney disease, stage 3a: Secondary | ICD-10-CM

## 2023-02-21 DIAGNOSIS — Z1231 Encounter for screening mammogram for malignant neoplasm of breast: Secondary | ICD-10-CM | POA: Diagnosis not present

## 2023-02-21 DIAGNOSIS — E538 Deficiency of other specified B group vitamins: Secondary | ICD-10-CM | POA: Diagnosis not present

## 2023-02-21 DIAGNOSIS — Z8673 Personal history of transient ischemic attack (TIA), and cerebral infarction without residual deficits: Secondary | ICD-10-CM

## 2023-02-21 DIAGNOSIS — Z1211 Encounter for screening for malignant neoplasm of colon: Secondary | ICD-10-CM

## 2023-02-21 LAB — POCT GLYCOSYLATED HEMOGLOBIN (HGB A1C): Hemoglobin A1C: 7 % — AB (ref 4.0–5.6)

## 2023-02-21 MED ORDER — METFORMIN HCL ER 750 MG PO TB24: 750.0000 mg | ORAL_TABLET | Freq: Every day | ORAL | 1 refills | Status: DC

## 2023-02-21 MED ORDER — LISINOPRIL 2.5 MG PO TABS: 2.5000 mg | ORAL_TABLET | Freq: Every day | ORAL | 1 refills | Status: DC

## 2023-02-22 LAB — CBC WITH DIFFERENTIAL/PLATELET
Absolute Monocytes: 677 {cells}/uL (ref 200–950)
Basophils Absolute: 47 {cells}/uL (ref 0–200)
Basophils Relative: 0.5 %
Eosinophils Absolute: 367 {cells}/uL (ref 15–500)
Eosinophils Relative: 3.9 %
HCT: 39.9 % (ref 35.0–45.0)
Hemoglobin: 13.1 g/dL (ref 11.7–15.5)
Lymphs Abs: 3262 {cells}/uL (ref 850–3900)
MCH: 28.5 pg (ref 27.0–33.0)
MCHC: 32.8 g/dL (ref 32.0–36.0)
MCV: 86.7 fL (ref 80.0–100.0)
MPV: 10.1 fL (ref 7.5–12.5)
Monocytes Relative: 7.2 %
Neutro Abs: 5048 {cells}/uL (ref 1500–7800)
Neutrophils Relative %: 53.7 %
Platelets: 305 10*3/uL (ref 140–400)
RBC: 4.6 10*6/uL (ref 3.80–5.10)
RDW: 13.3 % (ref 11.0–15.0)
Total Lymphocyte: 34.7 %
WBC: 9.4 10*3/uL (ref 3.8–10.8)

## 2023-02-22 LAB — COMPLETE METABOLIC PANEL WITH GFR
AG Ratio: 1.5 (calc) (ref 1.0–2.5)
ALT: 11 U/L (ref 6–29)
AST: 13 U/L (ref 10–35)
Albumin: 4.1 g/dL (ref 3.6–5.1)
Alkaline phosphatase (APISO): 67 U/L (ref 37–153)
BUN: 14 mg/dL (ref 7–25)
CO2: 26 mmol/L (ref 20–32)
Calcium: 9.9 mg/dL (ref 8.6–10.4)
Chloride: 104 mmol/L (ref 98–110)
Creat: 0.74 mg/dL (ref 0.60–1.00)
Globulin: 2.7 g/dL (ref 1.9–3.7)
Glucose, Bld: 71 mg/dL (ref 65–99)
Potassium: 4 mmol/L (ref 3.5–5.3)
Sodium: 141 mmol/L (ref 135–146)
Total Bilirubin: 0.5 mg/dL (ref 0.2–1.2)
Total Protein: 6.8 g/dL (ref 6.1–8.1)
eGFR: 83 mL/min/{1.73_m2} (ref >=60)

## 2023-02-22 LAB — LIPID PANEL
Cholesterol: 172 mg/dL (ref <200)
HDL: 61 mg/dL (ref >=50)
LDL Cholesterol (Calc): 88 mg/dL
Non-HDL Cholesterol (Calc): 111 mg/dL (ref <130)
Total CHOL/HDL Ratio: 2.8 (calc) (ref <5.0)
Triglycerides: 131 mg/dL (ref <150)

## 2023-02-22 LAB — MICROALBUMIN / CREATININE URINE RATIO
Creatinine, Urine: 262 mg/dL (ref 20–275)
Microalb Creat Ratio: 3 mg/g{creat} (ref <30)
Microalb, Ur: 0.8 mg/dL

## 2023-02-22 LAB — VITAMIN D 25 HYDROXY (VIT D DEFICIENCY, FRACTURES): Vit D, 25-Hydroxy: 50 ng/mL (ref 30–100)

## 2023-02-22 LAB — B12 AND FOLATE PANEL
Folate: 7.4 ng/mL
Vitamin B-12: >2000 pg/mL — ABNORMAL HIGH (ref 200–1100)

## 2023-03-25 ENCOUNTER — Ambulatory Visit (INDEPENDENT_AMBULATORY_CARE_PROVIDER_SITE_OTHER): Payer: Medicare HMO

## 2023-03-25 DIAGNOSIS — Z23 Encounter for immunization: Secondary | ICD-10-CM

## 2023-04-07 ENCOUNTER — Other Ambulatory Visit: Payer: Self-pay | Admitting: Family Medicine

## 2023-04-07 DIAGNOSIS — E785 Hyperlipidemia, unspecified: Secondary | ICD-10-CM

## 2023-04-10 NOTE — Telephone Encounter (Signed)
Copied from CRM [id number]. Topic: General - Other >> Apr 09, 2023  3:50 PM Franchot Heidelberg wrote: Reason for CRM: Needs Prior Auth for a procedure scheduled April 14 2023 at [facility] from [facility]  Best contact: [phone] ext. [phone] 48 hour window, please contact Mang Hazelrigg once authorization is in place

## 2023-04-14 ENCOUNTER — Encounter: Payer: Self-pay | Admitting: Pharmacist

## 2023-04-14 ENCOUNTER — Ambulatory Visit: Payer: Medicare HMO

## 2023-04-14 ENCOUNTER — Other Ambulatory Visit: Payer: Medicare HMO | Admitting: Pharmacist

## 2023-04-14 NOTE — Patient Instructions (Signed)
Goals Addressed             This Visit's Progress    Pharmacy Goals       Please watch the mail for an envelope from [facility] containing the patient assistance program application. Please complete this application and mail back to [facility] Pharmacy Technician Noreene Larsson [place] along with a copy of your Medicare Part D prescription card and a copy of your proof of income document OR you can bring these documents to the office to have them faxed back to Attention: Pattricia Boss at Fax # [phone]   If you need to call Noreene Larsson, you can reach her at [phone]   If you need to reach out to patient assistance program regarding refills or to find out the status of your application, you can do so by calling:   [employer] at [phone]     Thank you!   Estelle Grumbles, PharmD, [facility] [phone]

## 2023-04-14 NOTE — Progress Notes (Signed)
04/14/2023 Name: Christine Roberts MRN: [id number] DOB: 1946-06-23  Chief Complaint  Patient presents with   Medication Assistance    Christine Roberts is a 77 y.o. year old female who presented for a telephone visit.   They were referred to the pharmacist by a quality report for assistance in managing medication access.    Subjective:  Care Team: Primary Care Provider: Alba Cory, MD ; Next Scheduled Visit: 06/23/2023 Pulmonologist: Salena Saner, MD; Next Scheduled 04/22/2023  Medication Access/Adherence  Current Pharmacy:  [facility] - [place], [place] - [address] [place] [address] Phone: [phone] Fax: [phone]  [facility] [address], [place] - [address] [place] [address] Phone: [phone] Fax: [phone]  [facility] - [place], [place] - [address] [place] [address] Phone: [phone] Fax: [phone]   Patient reports affordability concerns with their medications: No  Patient reports access/transportation concerns to their pharmacy: No  Patient reports adherence concerns with their medications:  No     Diabetes:  Current medications:  - Tresiba 14 units daily - Ozempic 2 mg weekly - metformin ER 750 mg daily with breakfast  Medications tried in the past: [place]  Current glucose readings: morning fasting ranging: 86-105   Patient denies hypoglycemic s/sx including dizziness, shakiness, sweating.   Statin therapy: atorvastatin 40 mg three times/week  Current physical activity: reports stays active daily doing yard work and walking  Current medication access support: enrolled in [employer] patient assistance for [place] and Ozempic through 07/08/2023   Objective:  Lab Results  Component Value Date   HGBA1C 7.0 (A) 02/21/2023    Lab Results  Component Value Date   CREATININE 0.74 02/21/2023   BUN 14 02/21/2023   NA  141 02/21/2023   K 4.0 02/21/2023   CL 104 02/21/2023   CO2 26 02/21/2023    Lab Results  Component Value Date   CHOL 172 02/21/2023   HDL 61 02/21/2023   LDLCALC 88 02/21/2023   TRIG 131 02/21/2023   CHOLHDL 2.8 02/21/2023    Medications Reviewed Today     Reviewed by Manuela Neptune, RPH-CPP (Pharmacist) on 04/14/23 at 1623  Med List Status: <None>   Medication Order Taking? Sig Documenting Provider Last Dose Status Informant  ACCU-CHEK SOFTCLIX LANCETS lancets [id number]  1 each by Other route daily. Alba Cory, MD  Active   acetaminophen (TYLENOL) 650 MG CR tablet [id number]  Take 650 mg by mouth every 8 (eight) hours as needed. [provider]  Active   albuterol (VENTOLIN HFA) 108 (90 Base) MCG/ACT inhaler [id number]  Inhale 2 puffs into the lungs every 6 (six) hours as needed for wheezing or shortness of breath. Danelle Berry, PA-C  Active   Alcohol Swabs (B-D SINGLE USE SWABS REGULAR) PADS [id number]  1 each by Does not apply route daily. Alba Cory, MD  Active   aspirin EC 81 MG tablet [id number]  Take 1 tablet (81 mg total) by mouth daily. Alba Cory, MD  Active Self  atorvastatin (LIPITOR) 40 MG tablet [id number]  TAKE 1 TABLET THREE TIMES WEEKLY Sowles, Danna Hefty, MD  Active   Blood Glucose Monitoring Suppl (TRUE METRIX AIR GLUCOSE METER) w/Device KIT [id number]  1 each by Does not apply route once for 1 dose. Alba Cory, MD  Expired 10/09/22 2359   calcium citrate (CALCITRATE - DOSED IN MG ELEMENTAL CALCIUM) 950  MG tablet [id number]  Take 200 mg of elemental calcium by mouth daily. Has D3 in it as well [provider]  Active Self  Cholecalciferol (VITAMIN D) 125 MCG (5000 UT) CAPS [id number]  Take by mouth. Pt taking 3 times per week [provider]  Active   Coenzyme Q10 (COQ10) 100 MG CAPS [id number]  Take 1 capsule by mouth daily. [provider]  Active   fluticasone (FLONASE) 50 MCG/ACT nasal spray [id number]  Place 2  sprays into both nostrils daily. Danelle Berry, PA-C  Active   glucose blood (TRUE METRIX BLOOD GLUCOSE TEST) test strip [id number]  TEST BLOOD SUGAR IN THE MORNING, AT NOON, AND AT BEDTIME Alba Cory, MD  Active   insulin degludec (TRESIBA FLEXTOUCH) 200 UNIT/ML FlexTouch Pen [id number] Yes Inject 28 Units into the skin daily. Patient receives through [employer] Patient Assistance through Dec 2022  Patient taking differently: Inject 14 Units into the skin daily. Patient receives through [employer] Patient Assistance through Dec 2022   Carlynn Purl, Danna Hefty, MD Taking Active   Insulin Pen Needle (NOVOFINE) 30G X 8 MM MISC [id number]  Inject 10 each into the skin as needed. Alba Cory, MD  Active   lisinopril (ZESTRIL) 2.5 MG tablet [id number]  Take 1 tablet (2.5 mg total) by mouth daily. Alba Cory, MD  Active   loratadine (CLARITIN) 10 MG tablet [id number]  Take 1 tablet (10 mg total) by mouth at bedtime. Danelle Berry, PA-C  Active   metFORMIN (GLUCOPHAGE-XR) 750 MG 24 hr tablet [id number] Yes Take 1 tablet (750 mg total) by mouth daily with breakfast. Alba Cory, MD Taking Active   metroNIDAZOLE (METROCREAM) 0.75 % cream [id number]  Apply qd/bid at mid face and nose for rosacea Willeen Niece, MD  Active   mupirocin ointment (BACTROBAN) 2 % [id number]  Apply 1 Application topically 2 (two) times daily. Willeen Niece, MD  Active   Semaglutide, 2 MG/DOSE, 8 MG/3ML Namon Cirri [id number] Yes Inject 2 mg as directed once a week. Alba Cory, MD Taking Active   VITAMIN B COMPLEX-C PO [id number]  Take by mouth. [provider]  Active               Assessment/Plan:   Diabetes: - Currently controlled - Reviewed long term cardiovascular and renal outcomes of uncontrolled blood sugar - Reviewed goal A1c, goal fasting, and goal 2 hour post prandial glucose - Reviewed importance of having regular well-balanced meals throughout the day, while controlling carbohydrate portion sizes -  Counsel on monitoring for signs of and management of hypoglycemia  Encourage patient to continue to carry glucose tablets in case needed for low blood sugar - Recommend to check glucose, keep log of results and have this record to review at upcoming medical appointments. Patient to contact provider office sooner if needed for readings outside of established parameters or symptoms - Meets financial criteria for Ozempic, Evaristo Bury and pen needles patient assistance program through [employer]. Will collaborate with provider, CPhT, and patient to pursue assistance.     Follow Up Plan: Clinical Pharmacist will follow up with patient by telephone in January  Estelle Grumbles, PharmD, [facility] [phone]

## 2023-04-17 ENCOUNTER — Telehealth: Payer: Self-pay | Admitting: Family Medicine

## 2023-04-17 NOTE — Telephone Encounter (Signed)
Copied from CRM [id number]. Topic: General - Other >> Apr 17, 2023  9:58 AM Charletta Cousin wrote: Efraim Kaufmann from [facility] calling stating patient needs prior auth for upcoming 04/21/2023 CT CHEST NODULE FOLLOW UP LOW appointment. Caller would like a follow up call when completed.

## 2023-04-21 ENCOUNTER — Ambulatory Visit
Admission: RE | Admit: 2023-04-21 | Discharge: 2023-04-21 | Disposition: A | Discharge status: Home / Self Care | Payer: Medicare HMO | Source: Ambulatory Visit | Attending: Family Medicine | Admitting: Family Medicine

## 2023-04-21 DIAGNOSIS — J929 Pleural plaque without asbestos: Secondary | ICD-10-CM | POA: Diagnosis not present

## 2023-04-21 DIAGNOSIS — J9811 Atelectasis: Secondary | ICD-10-CM | POA: Diagnosis not present

## 2023-04-21 DIAGNOSIS — R911 Solitary pulmonary nodule: Secondary | ICD-10-CM | POA: Insufficient documentation

## 2023-04-22 ENCOUNTER — Ambulatory Visit: Payer: Medicare HMO | Admitting: Pulmonary Disease

## 2023-04-22 ENCOUNTER — Encounter: Payer: Self-pay | Admitting: Pulmonary Disease

## 2023-04-22 VITALS — BP 118/78 | HR 86 | Temp 97.7°F | Ht 64.0 in | Wt 160.4 lb

## 2023-04-22 DIAGNOSIS — Z8616 Personal history of COVID-19: Secondary | ICD-10-CM | POA: Diagnosis not present

## 2023-04-22 DIAGNOSIS — J9811 Atelectasis: Secondary | ICD-10-CM

## 2023-04-22 DIAGNOSIS — R911 Solitary pulmonary nodule: Secondary | ICD-10-CM

## 2023-04-22 NOTE — Patient Instructions (Signed)
VISIT SUMMARY:  During your recent visit, we discussed your lung nodule and the scar on your left lung. You reported feeling well and have not noticed any significant changes in your health. We also discussed your vaccination status.  YOUR PLAN:  -LUNG NODULE: Your lung nodule, a small growth in your lung, appears stable based on your recent CT scan. We will wait for the formal report from the radiologist and plan to follow up in 6 months.  -LUNG SCAR: The scar on your left lung, likely from a previous pneumonia, has not changed. We will continue to monitor this.  -VACCINATIONS: You have received your flu shot but declined the COVID and RSV vaccinations. I encourage you to consider getting the COVID vaccine, which is available at local pharmacies.  INSTRUCTIONS:  We will schedule a follow-up CT scan based on the radiologist's interpretation. If you have any questions or concerns, don't hesitate to contact us.  We will see you in follow-up in 6 months time otherwise.

## 2023-04-22 NOTE — Progress Notes (Signed)
Subjective:    Patient ID: Christine Roberts, female    DOB: August 08, 1945, 77 y.o.   MRN: [id number]  Patient Care Team: Alba Cory, MD as PCP - General (Family Medicine) Dingeldein, Viviann Spare, MD as Consulting Physician (Ophthalmology) Sondra Come, MD as Consulting Physician (Urology) Stanton Kidney, MD as Consulting Physician (Gastroenterology) Carman Ching, PA-C as Physician Assistant (Urology) Ronney Asters, Jackelyn Poling, RPH-CPP as Pharmacist  Chief Complaint  Patient presents with   Follow-up    SOB, wheezing, or cough.   BACKGROUND/INTERVAL:Patient is a 77 year old remote former smoker with minimal tobacco exposure in the past (5 PY or less) and a history as noted below presents for follow-up of a lung nodule noted on chest CT on 18 April 2022.    HPI Discussed the use of AI scribe software for clinical note transcription with the patient, who gave verbal consent to proceed.  History of Present Illness   The patient, with a history of a lung nodule, presents for a follow-up visit after a recent CT scan. She reports feeling good and has not noticed any significant changes in her health status. The patient has a known lung nodule on the right side, which has been stable, and a scar on the left lung, present since 2019, likely due to a previous episode of pneumonia. The patient was unaware of having pneumonia until a recent bout this year, suggesting she may have had an undiagnosed case in the past.  The patient has been trying to stay active and has not experienced any problems with shortness of breath. She has a family history of lung cancer in a daughter who never smoked, but she lived with a smoker. The patient's own smoking history is limited, with only about five pack years.  The patient has already received her flu shot for the year. She has not yet received the COVID-19 vaccine and is still considering whether to get it. She has not received the RSV shot and does not  wish to.      Review of Systems A 10 point review of systems was performed and it is as noted above otherwise negative.   Patient Active Problem List   Diagnosis Date Noted   Atherosclerosis of aorta (HCC) 05/25/2018   Seasonal allergic rhinitis 09/12/2015   TIA (transient ischemic attack) 05/24/2015   Senile purpura (HCC) 05/09/2015   Benign essential HTN 03/18/2015   Pain in shoulder 03/18/2015   Dyslipidemia 03/18/2015   History of surgery to major organs, presenting hazards to health 03/18/2015   Hearing loss 03/18/2015   Hiatal hernia 03/18/2015   History of colon polyps 03/18/2015   Calculus of kidney 03/18/2015   B12 deficiency 03/18/2015   Microalbuminuria 03/18/2015   Adult BMI 30+ 03/18/2015   Arthritis, degenerative 03/18/2015   Diabetes mellitus with nephropathy (HCC) 03/18/2015    Social History   Tobacco Use   Smoking status: Former    Current packs/day: 0.50    Average packs/day: 0.5 packs/day for 10.0 years (5.0 ttl pk-yrs)    Types: Cigarettes    Passive exposure: Past   Smokeless tobacco: Never   Tobacco comments:    smoking cessation materials not required.  Quit 30 years ago.  Substance Use Topics   Alcohol use: Yes    Alcohol/week: 1.0 standard drink of alcohol    Types: 1 Shots of liquor per week    Comment: socially    Allergies  Allergen Reactions   Latex Rash   Erythromycin Other (See  Comments)    Strawberry tongue    Current Meds  Medication Sig   ACCU-CHEK SOFTCLIX LANCETS lancets 1 each by Other route daily.   acetaminophen (TYLENOL) 650 MG CR tablet Take 650 mg by mouth every 8 (eight) hours as needed.   albuterol (VENTOLIN HFA) 108 (90 Base) MCG/ACT inhaler Inhale 2 puffs into the lungs every 6 (six) hours as needed for wheezing or shortness of breath.   Alcohol Swabs (B-D SINGLE USE SWABS REGULAR) PADS 1 each by Does not apply route daily.   aspirin EC 81 MG tablet Take 1 tablet (81 mg total) by mouth daily.   atorvastatin  (LIPITOR) 40 MG tablet TAKE 1 TABLET THREE TIMES WEEKLY   calcium citrate (CALCITRATE - DOSED IN MG ELEMENTAL CALCIUM) 950 MG tablet Take 200 mg of elemental calcium by mouth daily. Has D3 in it as well   Cholecalciferol (VITAMIN D) 125 MCG (5000 UT) CAPS Take by mouth. Pt taking 3 times per week   Coenzyme Q10 (COQ10) 100 MG CAPS Take 1 capsule by mouth daily.   fluticasone (FLONASE) 50 MCG/ACT nasal spray Place 2 sprays into both nostrils daily.   glucose blood (TRUE METRIX BLOOD GLUCOSE TEST) test strip TEST BLOOD SUGAR IN THE MORNING, AT NOON, AND AT BEDTIME   insulin degludec (TRESIBA FLEXTOUCH) 200 UNIT/ML FlexTouch Pen Inject 28 Units into the skin daily. Patient receives through [employer] Patient Assistance through Dec 2022 (Patient taking differently: Inject 14 Units into the skin daily. Patient receives through [employer] Patient Assistance through Dec 2022)   Insulin Pen Needle (NOVOFINE) 30G X 8 MM MISC Inject 10 each into the skin as needed.   lisinopril (ZESTRIL) 2.5 MG tablet Take 1 tablet (2.5 mg total) by mouth daily.   loratadine (CLARITIN) 10 MG tablet Take 1 tablet (10 mg total) by mouth at bedtime.   metFORMIN (GLUCOPHAGE-XR) 750 MG 24 hr tablet Take 1 tablet (750 mg total) by mouth daily with breakfast.   metroNIDAZOLE (METROCREAM) 0.75 % cream Apply qd/bid at mid face and nose for rosacea   Semaglutide, 2 MG/DOSE, 8 MG/3ML SOPN Inject 2 mg as directed once a week.   VITAMIN B COMPLEX-C PO Take by mouth.    Immunization History  Administered Date(s) Administered   Fluad Quad(high Dose 65+) 03/24/2019   Fluad Trivalent(High Dose 65+) 03/25/2023   Influenza Split 04/11/2009   Influenza, High Dose Seasonal PF 03/21/2015, 05/10/2016, 03/30/2017, 03/30/2018   Influenza, Seasonal, Injecte, Preservative Fre 04/07/2012   Influenza,inj,Quad PF,6+ Mos 03/07/2014   Influenza-Unspecified 03/07/2014, 03/30/2017, 05/15/2020, 05/30/2021, 05/23/2022   PFIZER(Purple Top)SARS-COV-2  Vaccination 07/15/2019, 08/05/2019, 06/27/2020   Pfizer Covid-19 Vaccine Bivalent Booster 36yrs & up 12/26/2020, 05/23/2022   Pneumococcal Conjugate-13 03/21/2015   Pneumococcal Polysaccharide-23 10/25/2009   Tdap 10/25/2009, 03/14/2018, 01/06/2023   Zoster, Live 09/24/2011        Objective:     BP 118/78 (BP Location: Right Arm, Cuff Size: Normal)   Pulse 86   Temp 97.7 F (36.5 C)   Ht 5\' 4"  (1.626 m)   Wt 160 lb 6.4 oz (72.8 kg)   SpO2 98%   BMI 27.53 kg/m   SpO2: 98 %  GENERAL: Well-developed, overweight woman, no acute distress, fully ambulatory, no conversational dyspnea. HEAD: Normocephalic, atraumatic.  EYES: Pupils equal, round, reactive to light.  No scleral icterus.  MOUTH: Some teeth in poor repair, oral mucosa moist.  No thrush. NECK: Supple. No thyromegaly. Trachea midline. No JVD.  No adenopathy. PULMONARY: Good air  entry bilaterally.  No adventitious sounds. CARDIOVASCULAR: S1 and S2. Regular rate and rhythm.  No rubs, murmurs or gallops heard. ABDOMEN: Benign. MUSCULOSKELETAL: No joint deformity, no clubbing, no edema.  NEUROLOGIC: No overt focal deficit, no gait disturbance, speech is fluent. SKIN: Intact,warm,dry. PSYCH: Mood and behavior normal   Representative image from the CT performed 21 April 2023 showing a relatively unchanged 7 x 3 mm (mean diameter 5 mm) nodule unchanged from prior and area of architectural distortion on the left lower lobe likely due to rounded atelectasis:       Assessment & Plan:     ICD-10-CM   1. Incidental lung nodule, > 3mm and < 8mm  R91.1     2. Atelectasis, left  J98.11     3. Personal history of COVID-19  Z86.16      Assessment and Plan    Pulmonary Nodule Stable appearance of right lung nodule on recent CT scan compared to prior imaging from December 2022. No overt changes noted. Awaiting formal radiologist report. -Plan to follow up in 6 months, pending radiologist report. -Will schedule follow-up  CT scan based on radiologist's interpretation.  Pulmonary Scar Chronic left lung scar, likely from prior pneumonia. No changes noted. -Continue to monitor.  General Health Maintenance Patient has received flu shot. Patient declined COVID and RSV vaccinations. -Encouraged patient to consider RSV vaccination, available at local pharmacies.     Gailen Shelter, MD Advanced Bronchoscopy PCCM [facility]-    *This note was dictated using voice recognition software/Dragon.  Despite best efforts to proofread, errors can occur which can change the meaning. Any transcriptional errors that result from this process are unintentional and may not be fully corrected at the time of dictation.

## 2023-04-24 ENCOUNTER — Telehealth: Payer: Self-pay | Admitting: Pharmacy Technician

## 2023-04-24 DIAGNOSIS — Z5986 Financial insecurity: Secondary | ICD-10-CM

## 2023-04-24 NOTE — Progress Notes (Signed)
[facility] Customer service manager [facility])                                            [facility] Quality Pharmacy Team    04/24/2023  KASSIDI ELZA 05-12-1946 [id number]                                      Medication Assistance Referral  Referral From: [facility] Embedded RPh Vallery Sa   Medication/Company: Evaristo Bury / [employer] Patient application portion:  Mailed Provider application portion: Faxed  to Dr. Alba Cory Provider address/fax verified via: Office website  Medication/Company: Franki Monte / [employer] Patient application portion:  Mailed Provider application portion: Faxed  to Dr. Alba Cory Provider address/fax verified via: Office website  Pattricia Boss, CPhT [place]  Office: [phone] Fax: [phone] Email: [email or web address]

## 2023-05-08 ENCOUNTER — Telehealth: Payer: Self-pay | Admitting: Pharmacy Technician

## 2023-05-08 DIAGNOSIS — Z5986 Financial insecurity: Secondary | ICD-10-CM

## 2023-05-08 NOTE — Progress Notes (Signed)
[facility])                                            [facility] Quality Pharmacy Team    05/08/2023  Christine Roberts [birth year] [id number]  Received both patient and provider portion(s) of patient assistance application(s) for [place] and Ozempic. Faxed completed application and required documents into [employer].  Pattricia Boss, CPhT [place]  Office: [phone] Fax: [phone] Email: [email or web address]

## 2023-05-16 ENCOUNTER — Telehealth: Payer: Self-pay | Admitting: Pharmacy Technician

## 2023-05-16 DIAGNOSIS — Z5986 Financial insecurity: Secondary | ICD-10-CM

## 2023-05-16 NOTE — Progress Notes (Signed)
[facility] Customer service manager [facility])                                            [facility] Quality Pharmacy Team    05/16/2023  Christine Roberts 07-Apr-1946 [id number]  Care coordination call placed to [employer] in regard to [place] and Ozempic application.  Spoke to Tammy who informs patient has been enrolled for the 2025 year. The patient's id number with [employer] is [id number]. She is APPROVED 07/09/23-07/07/24. She informs her refills will begin processing automatically based on last time filled in 2024 and will deliver to the prescriber's office. Patient may call [employer] at anytime to check on next auto refill date and delivery date by calling [phone]. In basket message sent to [facility] notifying of this information as well.  Pattricia Boss, CPhT   Office: [phone] Fax: [phone] Email: [name]

## 2023-06-12 NOTE — Progress Notes (Signed)
Name: Christine Roberts   MRN: [id number]    DOB: January 17, 1946   Date:06/23/2023       Progress Note  Subjective  Chief Complaint  Chief Complaint  Patient presents with   Medical Management of Chronic Issues    HPI  Discussed the use of AI scribe software for clinical note transcription with the patient, who gave verbal consent to proceed.  History of Present Illness   The patient, a 77 year old with a history of type 2 diabetes, hypertension, dyslipidemia, and aortic atherosclerosis, reports no new health issues since the last visit four months ago. She has been managing her diabetes with Ozempic, Metformin, and Tresiba, and has noticed a significant improvement in her blood sugar levels, which have been consistently in the 80s and 90s. However, she has noticed an increase in appetite and is concerned about maintaining her current weight of 160 lbs.  The patient also reports a history of chronic kidney disease, which has shown significant improvement. She is currently on Lisinopril for associated nephropathy and HTN. She denies any side effects from the medication.  In addition to her diabetes management, the patient is on Atorvastatin for dyslipidemia and aortic atherosclerosis. She reports experiencing foot cramps, which she manages by taking the medication four times a week. She also reports a change in sensation in her toes, which she describes as not numb but different. She suspects it might be mild neuropathy.  The patient has a history of hemorrhoids and has been experiencing bleeding. She has been referred to a surgeon for potential banding. She also reports a history of senile purpura, which she manages by avoiding activities that could lead to bruising. She has noticed an increase in tremors, particularly in her head, but does not report any balance issues.  The patient is due for a colonoscopy due to a history of polyps and is also due for a mammogram. She has been vaccinated for flu  but declines other vaccinations, including RSV and shingles, due to previous adverse reactions. She is due for a PCV 20 and is willing to get that today         Patient Active Problem List   Diagnosis Date Noted   Atherosclerosis of aorta (HCC) 05/25/2018   Seasonal allergic rhinitis 09/12/2015   TIA (transient ischemic attack) 05/24/2015   Senile purpura (HCC) 05/09/2015   Benign essential HTN 03/18/2015   Pain in shoulder 03/18/2015   Dyslipidemia 03/18/2015   History of surgery to major organs, presenting hazards to health 03/18/2015   Hearing loss 03/18/2015   Hiatal hernia 03/18/2015   History of colon polyps 03/18/2015   Calculus of kidney 03/18/2015   B12 deficiency 03/18/2015   Microalbuminuria 03/18/2015   Adult BMI 30+ 03/18/2015   Arthritis, degenerative 03/18/2015   Diabetes mellitus with nephropathy (HCC) 03/18/2015    Past Surgical History:  Procedure Laterality Date   ABDOMINAL HYSTERECTOMY     CHOLECYSTECTOMY     COLONOSCOPY  2015   COLONOSCOPY WITH PROPOFOL N/A 03/04/2018   Procedure: COLONOSCOPY WITH PROPOFOL;  Surgeon: Toledo, Boykin Nearing, MD;  Location: [facility] ENDOSCOPY;  Service: Gastroenterology;  Laterality: N/A;   cystoscopic     stent placed in ureter   LITHOTRIPSY  12/2007   MYRINGOTOMY Right 11/2009    Family History  Problem Relation Age of Onset   Cancer Mother    Diabetes Mother    Diabetes Daughter    CAD Father    Diabetes Daughter  hypoglycemia    Social History   Tobacco Use   Smoking status: Former    Current packs/day: 0.50    Average packs/day: 0.5 packs/day for 10.0 years (5.0 ttl pk-yrs)    Types: Cigarettes    Passive exposure: Past   Smokeless tobacco: Never   Tobacco comments:    smoking cessation materials not required.  Quit 30 years ago.  Substance Use Topics   Alcohol use: Yes    Alcohol/week: 1.0 standard drink of alcohol    Types: 1 Shots of liquor per week    Comment: socially     Current Outpatient  Medications:    ACCU-CHEK SOFTCLIX LANCETS lancets, 1 each by Other route daily., Disp: 100 each, Rfl: 1   acetaminophen (TYLENOL) 650 MG CR tablet, Take 650 mg by mouth every 8 (eight) hours as needed., Disp: , Rfl:    albuterol (VENTOLIN HFA) 108 (90 Base) MCG/ACT inhaler, Inhale 2 puffs into the lungs every 6 (six) hours as needed for wheezing or shortness of breath., Disp: 8 g, Rfl: 0   Alcohol Swabs (B-D SINGLE USE SWABS REGULAR) PADS, 1 each by Does not apply route daily., Disp: 400 each, Rfl: 1   aspirin EC 81 MG tablet, Take 1 tablet (81 mg total) by mouth daily., Disp: 90 tablet, Rfl: 0   atorvastatin (LIPITOR) 40 MG tablet, TAKE 1 TABLET THREE TIMES WEEKLY, Disp: 36 tablet, Rfl: 0   calcium citrate (CALCITRATE - DOSED IN MG ELEMENTAL CALCIUM) 950 MG tablet, Take 200 mg of elemental calcium by mouth daily. Has D3 in it as well, Disp: , Rfl:    Cholecalciferol (VITAMIN D) 125 MCG (5000 UT) CAPS, Take by mouth. Pt taking 3 times per week, Disp: , Rfl:    Coenzyme Q10 (COQ10) 100 MG CAPS, Take 1 capsule by mouth daily., Disp: , Rfl:    fluticasone (FLONASE) 50 MCG/ACT nasal spray, Place 2 sprays into both nostrils daily., Disp: 16 g, Rfl: 6   glucose blood (TRUE METRIX BLOOD GLUCOSE TEST) test strip, TEST BLOOD SUGAR IN THE MORNING, AT NOON, AND AT BEDTIME, Disp: 200 strip, Rfl: 0   insulin degludec (TRESIBA FLEXTOUCH) 200 UNIT/ML FlexTouch Pen, Inject 28 Units into the skin daily. Patient receives through [employer] Patient Assistance through Dec 2022 (Patient taking differently: Inject 14 Units into the skin daily. Patient receives through [employer] Patient Assistance through Dec 2022), Disp: , Rfl:    Insulin Pen Needle (NOVOFINE) 30G X 8 MM MISC, Inject 10 each into the skin as needed., Disp: 100 each, Rfl: 2   lisinopril (ZESTRIL) 2.5 MG tablet, Take 1 tablet (2.5 mg total) by mouth daily., Disp: 90 tablet, Rfl: 1   loratadine (CLARITIN) 10 MG tablet, Take 1 tablet (10 mg total) by  mouth at bedtime., Disp: 30 tablet, Rfl: 5   metFORMIN (GLUCOPHAGE-XR) 750 MG 24 hr tablet, Take 1 tablet (750 mg total) by mouth daily with breakfast., Disp: 90 tablet, Rfl: 1   metroNIDAZOLE (METROCREAM) 0.75 % cream, Apply qd/bid at mid face and nose for rosacea, Disp: 45 g, Rfl: 5   mupirocin ointment (BACTROBAN) 2 %, Apply 1 Application topically 2 (two) times daily., Disp: 22 g, Rfl: 0   Semaglutide, 2 MG/DOSE, 8 MG/3ML SOPN, Inject 2 mg as directed once a week., Disp: 9 mL, Rfl: 0   VITAMIN B COMPLEX-C PO, Take by mouth., Disp: , Rfl:    Blood Glucose Monitoring Suppl (TRUE METRIX AIR GLUCOSE METER) w/Device KIT, 1 each by  Does not apply route once for 1 dose., Disp: 1 kit, Rfl: 0  Allergies  Allergen Reactions   Latex Rash   Erythromycin Other (See Comments)    Strawberry tongue    I personally reviewed active problem list, medication list, allergies, family history with the patient/caregiver today.   ROS  Ten systems reviewed and is negative except as mentioned in HPI    Objective  Vitals:   06/23/23 1347  BP: 124/72  Pulse: 87  Resp: 16  Temp: 98 F (36.7 C)  TempSrc: Oral  Weight: 160 lb 3.2 oz (72.7 kg)  Height: 5\' 4"  (1.626 m)    Body mass index is 27.5 kg/m.  Physical Exam  Constitutional: Patient appears well-developed and well-nourished. Obese  No distress.  HEENT: head atraumatic, normocephalic, pupils equal and reactive to light, neck supple Cardiovascular: Normal rate, regular rhythm and normal heart sounds.  No murmur heard. No BLE edema. Pulmonary/Chest: Effort normal and breath sounds normal. No respiratory distress. Skin: senile purpura  Abdominal: Soft.  There is no tenderness. Psychiatric: Patient has a normal mood and affect. behavior is normal. Judgment and thought content normal.   Recent Results (from the past 2160 hours)  POCT glycosylated hemoglobin (Hb A1C)     Status: Abnormal   Collection Time: 06/23/23  1:52 PM  Result Value Ref  Range   Hemoglobin A1C 6.9 (A) 4.0 - 5.6 %   HbA1c POC (<> result, manual entry)     HbA1c, POC (prediabetic range)     HbA1c, POC (controlled diabetic range)        PHQ2/9:    06/23/2023    1:45 PM 02/21/2023    1:35 PM 01/06/2023    3:21 PM 10/18/2022    1:51 PM 09/17/2022   10:45 AM  Depression screen PHQ 2/9  Decreased Interest 0 0 0 0 0  Down, Depressed, Hopeless 0 0 0 0 0  PHQ - 2 Score 0 0 0 0 0  Altered sleeping  0 0 0 0  Tired, decreased energy  0 0 0 0  Change in appetite  0 0 0 0  Feeling bad or failure about yourself   0 0 0 0  Trouble concentrating  0 0 0 0  Moving slowly or fidgety/restless  0 0 0 0  Suicidal thoughts  0 0 0 0  PHQ-9 Score  0 0 0 0  Difficult doing work/chores   Not difficult at all  Not difficult at all    phq 9 is negative   Fall Risk:    06/23/2023    1:45 PM 02/21/2023    1:35 PM 01/06/2023    3:20 PM 10/18/2022    1:51 PM 09/17/2022   10:45 AM  Fall Risk   Falls in the past year? 0 0 0 0 0  Number falls in past yr: 0 0 0  0  Injury with Fall? 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 Recent hypoglycemic episodes with blood glucose in the 80s-90s. -Reduce Tresiba to 10 units if blood glucose  continues to be below 100. -Consider further reduction to 8 units if necessary. -Resume B12 supplementation three days a week to potentially alleviate mild neuropathy symptoms.  Dyslipidemia and Aortic Atherosclerosis On Atorvastatin 40mg  four days a week. Reports mild foot  cramps. -Continue Atorvastatin 40mg  four days a week.  Hypertension Well controlled on a small dose of ACE inhibitor. No reported side effects. -Continue current management.  Hemorrhoids Reports bleeding and discomfort. Has been referred to a surgeon for potential banding but she prefers seeing GI first . -Contact GI  in January to discuss potential treatment options.  Senile Purpura Reports occasional bruising, likely due to minor trauma. -Continue current management.  Tremors Reports mild tremors, primarily in the head. No significant impact on daily activities. -Continue to monitor.  General Health Maintenance -Administer Pneumonia - PCV 20  vaccine today. -Schedule mammogram and eye exam. -Follow up in four months (April 2025).

## 2023-06-13 ENCOUNTER — Telehealth: Payer: Self-pay | Admitting: Pulmonary Disease

## 2023-06-13 NOTE — Telephone Encounter (Signed)
CTA done in hospital 04/2022. Dr. Jayme Cloud saw the patient on 06/24/22 ordered CT which was done 06/28/2022. Dr. Jayme Cloud saw patient 08/26/22 and 04/22/2023. Lavada Mesi had CT done 04/21/23. When should this patient have next CT done?

## 2023-06-18 ENCOUNTER — Other Ambulatory Visit: Payer: Self-pay | Admitting: Family Medicine

## 2023-06-18 DIAGNOSIS — E785 Hyperlipidemia, unspecified: Secondary | ICD-10-CM

## 2023-06-23 ENCOUNTER — Encounter: Payer: Self-pay | Admitting: Family Medicine

## 2023-06-23 ENCOUNTER — Ambulatory Visit: Payer: Medicare HMO | Admitting: Family Medicine

## 2023-06-23 VITALS — BP 124/72 | HR 87 | Temp 98.0°F | Resp 16 | Ht 64.0 in | Wt 160.2 lb

## 2023-06-23 DIAGNOSIS — R251 Tremor, unspecified: Secondary | ICD-10-CM | POA: Diagnosis not present

## 2023-06-23 DIAGNOSIS — E1121 Type 2 diabetes mellitus with diabetic nephropathy: Secondary | ICD-10-CM | POA: Diagnosis not present

## 2023-06-23 DIAGNOSIS — I10 Essential (primary) hypertension: Secondary | ICD-10-CM | POA: Diagnosis not present

## 2023-06-23 DIAGNOSIS — I152 Hypertension secondary to endocrine disorders: Secondary | ICD-10-CM

## 2023-06-23 DIAGNOSIS — Z23 Encounter for immunization: Secondary | ICD-10-CM

## 2023-06-23 DIAGNOSIS — Z8673 Personal history of transient ischemic attack (TIA), and cerebral infarction without residual deficits: Secondary | ICD-10-CM

## 2023-06-23 DIAGNOSIS — D692 Other nonthrombocytopenic purpura: Secondary | ICD-10-CM | POA: Diagnosis not present

## 2023-06-23 DIAGNOSIS — E1159 Type 2 diabetes mellitus with other circulatory complications: Secondary | ICD-10-CM

## 2023-06-23 DIAGNOSIS — I700 Atherosclerosis of aorta: Secondary | ICD-10-CM

## 2023-06-23 DIAGNOSIS — Z1231 Encounter for screening mammogram for malignant neoplasm of breast: Secondary | ICD-10-CM | POA: Diagnosis not present

## 2023-06-23 LAB — POCT GLYCOSYLATED HEMOGLOBIN (HGB A1C): Hemoglobin A1C: 6.9 % — AB (ref 4.0–5.6)

## 2023-07-15 DIAGNOSIS — K5790 Diverticulosis of intestine, part unspecified, without perforation or abscess without bleeding: Secondary | ICD-10-CM | POA: Diagnosis not present

## 2023-07-15 DIAGNOSIS — Z860101 Personal history of adenomatous and serrated colon polyps: Secondary | ICD-10-CM | POA: Diagnosis not present

## 2023-07-15 DIAGNOSIS — K648 Other hemorrhoids: Secondary | ICD-10-CM | POA: Diagnosis not present

## 2023-07-16 ENCOUNTER — Other Ambulatory Visit: Payer: Self-pay | Admitting: Pharmacist

## 2023-07-16 DIAGNOSIS — E1121 Type 2 diabetes mellitus with diabetic nephropathy: Secondary | ICD-10-CM

## 2023-07-16 NOTE — Patient Instructions (Signed)
 Goals Addressed             This Visit's Progress    Pharmacy Goals       If you need to reach out to patient assistance program regarding refills you can do so by calling:   [employer] at [phone]     Thank you!   Sharyle Sia, PharmD, [facility] [phone]

## 2023-07-16 NOTE — Progress Notes (Signed)
 07/16/2023 Name: Christine Roberts MRN: [id number] DOB: April 16, 1946  Chief Complaint  Patient presents with   Medication Assistance    Christine Roberts is a 78 y.o. year old female who presented for a telephone visit.   They were referred to the pharmacist by a quality report for assistance in managing medication access.      Subjective:   Care Team: Primary Care Provider: Sowles, Krichna, MD ; Next Scheduled Visit: 10/27/2023 Pulmonologist: Tamea Dedra CROME, MD GI Specialist: Toledo, Teodoro K, MD; Scheduled for Colonoscopy 09/03/2023    Medication Access/Adherence  Current Pharmacy:  [facility] - [place], [place] - [address] [place] [address] Phone: [phone] Fax: [phone]  [facility] [address] [place] - [address] [place] [address] Phone: [phone] Fax: [phone]  [facility] - [place], [place] - [address] [place] [address] [place] [address] Phone: [phone] Fax: [phone]   Patient reports affordability concerns with their medications: No  Patient reports access/transportation concerns to their pharmacy: No  Patient reports adherence concerns with their medications:  No       Diabetes:   Current medications:  - Tresiba  12 units daily - Ozempic  2 mg weekly - metformin  ER 750 mg daily with breakfast   Medications tried in the past: Farxiga    Current glucose readings: morning fasting ranging: 90-110     Patient denies hypoglycemic s/sx including dizziness, shakiness, sweating.    Statin therapy: atorvastatin  40 mg four times/week   Current physical activity: reports stays active daily doing yard work and walking   Current medication access support: enrolled in [employer] patient assistance for Tresiba  and Ozempic  through 07/07/2024   Objective:  Lab Results  Component Value Date   HGBA1C 6.9 (A) 06/23/2023    Lab Results  Component Value  Date   CREATININE 0.74 02/21/2023   BUN 14 02/21/2023   NA 141 02/21/2023   K 4.0 02/21/2023   CL 104 02/21/2023   CO2 26 02/21/2023    Lab Results  Component Value Date   CHOL 172 02/21/2023   HDL 61 02/21/2023   LDLCALC 88 02/21/2023   TRIG 131 02/21/2023   CHOLHDL 2.8 02/21/2023    Medications Reviewed Today     Reviewed by Alana Sharyle LABOR, RPH-CPP (Pharmacist) on 07/16/23 at 1525  Med List Status: <None>   Medication Order Taking? Sig Documenting Provider Last Dose Status Informant  ACCU-CHEK SOFTCLIX LANCETS lancets [id number]  1 each by Other route daily. Sowles, Krichna, MD  Active   acetaminophen  (TYLENOL ) 650 MG CR tablet [id number]  Take 650 mg by mouth every 8 (eight) hours as needed. [provider]  Active   albuterol  (VENTOLIN  HFA) 108 (90 Base) MCG/ACT inhaler [id number]  Inhale 2 puffs into the lungs every 6 (six) hours as needed for wheezing or shortness of breath. Leavy Mole, PA-C  Active   Alcohol Swabs (B-D SINGLE USE SWABS REGULAR) PADS [id number]  1 each by Does not apply route daily. Sowles, Krichna, MD  Active   aspirin  EC 81 MG tablet [id number]  Take 1 tablet (81 mg total) by mouth daily. Sowles, Krichna, MD  Active Self  atorvastatin  (LIPITOR) 40 MG tablet [id number] Yes TAKE 1 TABLET THREE TIMES WEEKLY Sowles, Krichna, MD Taking Active            Med Note [place], [facility]   Wed Jul 16, 2023  3:24 PM) Taking four days weekly  Blood Glucose Monitoring Suppl (TRUE METRIX AIR GLUCOSE METER) w/Device KIT [id number]  1 each by Does not apply route once for 1 dose. Sowles, Krichna, MD  Expired 10/09/22 2359   calcium  citrate (CALCITRATE - DOSED IN MG ELEMENTAL CALCIUM ) 950 MG tablet [id number]  Take 200 mg of elemental calcium  by mouth daily. Has D3 in it as well [provider]  Active Self  Cholecalciferol (VITAMIN D ) 125 MCG (5000 UT) CAPS [id number]  Take by mouth. Pt taking 3 times per week [provider]  Active   Coenzyme Q10  (COQ10) 100 MG CAPS [id number]  Take 1 capsule by mouth daily. [provider]  Active   fluticasone  (FLONASE ) 50 MCG/ACT nasal spray [id number]  Place 2 sprays into both nostrils daily. Tapia, Leisa, PA-C  Active   glucose blood (TRUE METRIX BLOOD GLUCOSE TEST) test strip [id number]  TEST BLOOD SUGAR IN THE MORNING, AT NOON, AND AT BEDTIME Sowles, Krichna, MD  Active   insulin  degludec (TRESIBA  FLEXTOUCH) 200 UNIT/ML FlexTouch Pen [id number] Yes Inject 28 Units into the skin daily. Patient receives through [employer] Patient Assistance through Dec 2022  Patient taking differently: Inject 12 Units into the skin daily. Patient receives through [employer] Patient Assistance through Dec 2022   Glenard, Krichna, MD Taking Active   Insulin  Pen Needle (NOVOFINE) 30G X 8 MM MISC [id number]  Inject 10 each into the skin as needed. Sowles, Krichna, MD  Active   lisinopril  (ZESTRIL ) 2.5 MG tablet [id number]  Take 1 tablet (2.5 mg total) by mouth daily. Sowles, Krichna, MD  Active   loratadine  (CLARITIN ) 10 MG tablet [id number]  Take 1 tablet (10 mg total) by mouth at bedtime. Tapia, Leisa, PA-C  Active   metFORMIN  (GLUCOPHAGE -XR) 750 MG 24 hr tablet [id number] Yes Take 1 tablet (750 mg total) by mouth daily with breakfast. Sowles, Krichna, MD Taking Active   metroNIDAZOLE  (METROCREAM ) 0.75 % cream [id number]  Apply qd/bid at mid face and nose for rosacea Jackquline Sawyer, MD  Active   mupirocin  ointment (BACTROBAN ) 2 % [id number]  Apply 1 Application topically 2 (two) times daily. Jackquline Sawyer, MD  Active   Semaglutide , 2 MG/DOSE, 8 MG/3ML SOPN [id number] Yes Inject 2 mg as directed once a week. Sowles, Krichna, MD Taking Active   VITAMIN B COMPLEX-C PO [id number]  Take by mouth. [provider]  Active               Assessment/Plan:   Diabetes: - Currently controlled - Reviewed long term cardiovascular and renal outcomes of uncontrolled blood sugar - Reviewed goal A1c, goal fasting, and  goal 2 hour post prandial glucose - Reviewed importance of having regular well-balanced meals throughout the day, while controlling carbohydrate portion sizes - Have counseled on monitoring for signs of and management of hypoglycemia             Encouraged patient to continue to carry glucose tablets in case needed for low blood sugar - Recommend to check glucose, keep log of results and have this record to review at upcoming medical appointments. Patient to contact provider office sooner if needed for readings outside of established parameters or symptoms   Health Maintenance - Encourage patient to schedule mammogram and annual eye exam      Follow Up Plan: Clinical Pharmacist will follow up with patient by telephone on 04/28/2024 at 1:00 PM    Sharyle Sia, PharmD, [facility] [phone]

## 2023-09-03 ENCOUNTER — Encounter: Payer: Self-pay | Admitting: Internal Medicine

## 2023-09-03 ENCOUNTER — Encounter
Admission: RE | Disposition: A | Discharge status: Home / Self Care | Payer: Self-pay | Source: Home / Self Care | Attending: Internal Medicine

## 2023-09-03 ENCOUNTER — Ambulatory Visit: Payer: HMO | Admitting: Certified Registered Nurse Anesthetist

## 2023-09-03 ENCOUNTER — Ambulatory Visit
Admission: RE | Admit: 2023-09-03 | Discharge: 2023-09-03 | Disposition: A | Discharge status: Home / Self Care | Payer: HMO | Attending: Internal Medicine | Admitting: Internal Medicine

## 2023-09-03 DIAGNOSIS — Z8673 Personal history of transient ischemic attack (TIA), and cerebral infarction without residual deficits: Secondary | ICD-10-CM | POA: Insufficient documentation

## 2023-09-03 DIAGNOSIS — K644 Residual hemorrhoidal skin tags: Secondary | ICD-10-CM | POA: Diagnosis not present

## 2023-09-03 DIAGNOSIS — K635 Polyp of colon: Secondary | ICD-10-CM | POA: Insufficient documentation

## 2023-09-03 DIAGNOSIS — K5730 Diverticulosis of large intestine without perforation or abscess without bleeding: Secondary | ICD-10-CM | POA: Insufficient documentation

## 2023-09-03 DIAGNOSIS — Z860101 Personal history of adenomatous and serrated colon polyps: Secondary | ICD-10-CM | POA: Diagnosis not present

## 2023-09-03 DIAGNOSIS — K649 Unspecified hemorrhoids: Secondary | ICD-10-CM | POA: Diagnosis not present

## 2023-09-03 DIAGNOSIS — E119 Type 2 diabetes mellitus without complications: Secondary | ICD-10-CM | POA: Insufficient documentation

## 2023-09-03 DIAGNOSIS — Z7985 Long-term (current) use of injectable non-insulin antidiabetic drugs: Secondary | ICD-10-CM | POA: Insufficient documentation

## 2023-09-03 DIAGNOSIS — Z794 Long term (current) use of insulin: Secondary | ICD-10-CM | POA: Diagnosis not present

## 2023-09-03 DIAGNOSIS — D123 Benign neoplasm of transverse colon: Secondary | ICD-10-CM | POA: Diagnosis not present

## 2023-09-03 DIAGNOSIS — Z1211 Encounter for screening for malignant neoplasm of colon: Secondary | ICD-10-CM | POA: Diagnosis not present

## 2023-09-03 DIAGNOSIS — Z87891 Personal history of nicotine dependence: Secondary | ICD-10-CM | POA: Diagnosis not present

## 2023-09-03 DIAGNOSIS — K642 Third degree hemorrhoids: Secondary | ICD-10-CM | POA: Diagnosis not present

## 2023-09-03 DIAGNOSIS — I10 Essential (primary) hypertension: Secondary | ICD-10-CM | POA: Diagnosis not present

## 2023-09-03 HISTORY — PX: POLYPECTOMY: SHX5525

## 2023-09-03 HISTORY — PX: COLONOSCOPY WITH PROPOFOL: SHX5780

## 2023-09-03 LAB — GLUCOSE, CAPILLARY
Glucose-Capillary: 69 mg/dL — ABNORMAL LOW (ref 70–99)
Glucose-Capillary: 67 mg/dL — ABNORMAL LOW (ref 70–99)
Glucose-Capillary: 70 mg/dL (ref 70–99)

## 2023-09-03 SURGERY — COLONOSCOPY WITH PROPOFOL
Anesthesia: General

## 2023-09-03 MED ORDER — PROPOFOL 1000 MG/100ML IV EMUL
INTRAVENOUS | Status: AC
  Filled 2023-09-03: qty 100

## 2023-09-03 MED ORDER — LIDOCAINE HCL (PF) 2 % IJ SOLN
INTRAMUSCULAR | Status: AC
  Filled 2023-09-03: qty 5

## 2023-09-03 MED ORDER — PROPOFOL 10 MG/ML IV BOLUS
INTRAVENOUS | Status: DC | PRN
  Administered 2023-09-03: 100 mg via INTRAVENOUS
  Administered 2023-09-03: 125 ug/kg/min via INTRAVENOUS

## 2023-09-03 MED ORDER — LIDOCAINE HCL (CARDIAC) PF 100 MG/5ML IV SOSY
PREFILLED_SYRINGE | INTRAVENOUS | Status: DC | PRN
  Administered 2023-09-03: 50 mg via INTRAVENOUS

## 2023-09-03 MED ORDER — LACTATED RINGERS IV SOLN
INTRAVENOUS | Status: DC
  Administered 2023-09-03: 40 mL/h via INTRAVENOUS

## 2023-09-03 MED ORDER — SODIUM CHLORIDE 0.9 % IV SOLN: INTRAVENOUS | Status: DC

## 2023-09-03 NOTE — Transfer of Care (Signed)
 Immediate Anesthesia Transfer of Care Note  Patient: Christine Roberts  Procedure(s) Performed: COLONOSCOPY WITH PROPOFOL POLYPECTOMY  Patient Location: Endoscopy Unit  Anesthesia Type:General  Level of Consciousness: awake, alert , and oriented  Airway & Oxygen Therapy: Patient Spontanous Breathing  Post-op Assessment: Report given to RN and Post -op Vital signs reviewed and stable  Post vital signs: Reviewed and stable  Last Vitals:  Vitals Value Taken Time  BP 82/51 09/03/23 1022  Temp 35.8 C 09/03/23 1021  Pulse 90 09/03/23 1022  Resp 16 09/03/23 1022  SpO2 96 % 09/03/23 1022  Vitals shown include unfiled device data.  Last Pain:  Vitals:   09/03/23 1021  TempSrc: Temporal  PainSc: 0-No pain         Complications: No notable events documented.

## 2023-09-03 NOTE — Op Note (Signed)
 [facility] Gastroenterology Patient Name: Akeylah Hendel Procedure Date: 09/03/2023 9:46 AM MRN: [id number] Account #: [id number] Date of Birth: April 16, 1946 Admit Type: Outpatient Age: 78 Room: [facility] ENDO ROOM 2 Gender: Female Note Status: Finalized Instrument Name: Prentice Docker [id number] Procedure:             Colonoscopy Indications:           High risk colon cancer surveillance: Personal history                         of multiple (3 or more) adenomas Providers:             Boykin Nearing. Norma Fredrickson MD, MD Referring MD:          Onnie Boer. Sowles, MD (Referring MD) Medicines:             Propofol per Anesthesia Complications:         No immediate complications. Estimated blood loss:                         Minimal. Procedure:             Pre-Anesthesia Assessment:                        - The risks and benefits of the procedure and the                         sedation options and risks were discussed with the                         patient. All questions were answered and informed                         consent was obtained.                        - Patient identification and proposed procedure were                         verified prior to the procedure by the nurse. The                         procedure was verified in the procedure room.                        - ASA Grade Assessment: III - A patient with severe                         systemic disease.                        - After reviewing the risks and benefits, the patient                         was deemed in satisfactory condition to undergo the                         procedure.                        After obtaining  informed consent, the colonoscope was                         passed under direct vision. Throughout the procedure,                         the patient's blood pressure, pulse, and oxygen                         saturations were monitored continuously. The                         Colonoscope was  introduced through the anus and                         advanced to the the cecum, identified by appendiceal                         orifice and ileocecal valve. The colonoscopy was                         performed without difficulty. The patient tolerated                         the procedure well. The quality of the bowel                         preparation was good. The ileocecal valve, appendiceal                         orifice, and rectum were photographed. Findings:      The perianal exam findings include non-thrombosed external hemorrhoids       and internal hemorrhoids that prolapse with straining, but require       manual replacement into the anal canal (Grade III).      The digital rectal exam was normal. Pertinent negatives include normal       sphincter tone.      Non-bleeding internal hemorrhoids were found during retroflexion. The       hemorrhoids were Grade III (internal hemorrhoids that prolapse but       require manual reduction).      Many small-mouthed diverticula were found in the sigmoid colon. There       was no evidence of diverticular bleeding.      A 15 mm polyp was found in the transverse colon. The polyp was       semi-pedunculated. The polyp was removed with a hot snare. Resection and       retrieval were complete.      A 5 mm polyp was found in the distal transverse colon. The polyp was       sessile. The polyp was removed with a jumbo cold forceps. The polyp was       removed with a cold snare. Resection and retrieval were complete.      A 4 mm polyp was found in the descending colon. The polyp was sessile.       The polyp was removed with a jumbo cold forceps. Resection and retrieval       were complete.      The exam was otherwise without abnormality. Impression:            -  Non-thrombosed external hemorrhoids and internal                         hemorrhoids that prolapse with straining, but require                         manual replacement into the  anal canal (Grade III)                         found on perianal exam.                        - Non-bleeding internal hemorrhoids.                        - Mild diverticulosis in the sigmoid colon. There was                         no evidence of diverticular bleeding.                        - One 15 mm polyp in the transverse colon, removed                         with a hot snare. Resected and retrieved.                        - One 5 mm polyp in the distal transverse colon,                         removed with a jumbo cold forceps and removed with a                         cold snare. Resected and retrieved.                        - One 4 mm polyp in the descending colon, removed with                         a jumbo cold forceps. Resected and retrieved.                        - The examination was otherwise normal. Recommendation:        - Patient has a contact number available for                         emergencies. The signs and symptoms of potential                         delayed complications were discussed with the patient.                         Return to normal activities tomorrow. Written                         discharge instructions were provided to the patient.                        -  Resume previous diet.                        - Continue present medications.                        - If polyps are benign or adenomatous without                         dysplasia, I will advise NO further colonoscopy due to                         advanced age and/or severe comorbidity.                        - Return to GI office PRN.                        - The findings and recommendations were discussed with                         the patient. Procedure Code(s):     --- Professional ---                        [phone], Colonoscopy, flexible; with removal of                         tumor(s), polyp(s), or other lesion(s) by snare                         technique                         45380, 59, Colonoscopy, flexible; with biopsy, single                         or multiple Diagnosis Code(s):     --- Professional ---                        K57.30, Diverticulosis of large intestine without                         perforation or abscess without bleeding                        D12.4, Benign neoplasm of descending colon                        D12.3, Benign neoplasm of transverse colon (hepatic                         flexure or splenic flexure)                        K64.4, Residual hemorrhoidal skin tags                        K64.2, Third degree hemorrhoids                        Z86.010, Personal history of colonic polyps CPT copyright  2022 American Medical Association. All rights reserved. The codes documented in this report are preliminary and upon coder review may  be revised to meet current compliance requirements. Stanton Kidney MD, MD 09/03/2023 10:22:32 AM This report has been signed electronically. Number of Addenda: 0 Note Initiated On: 09/03/2023 9:46 AM Scope Withdrawal Time: 0 hours 7 minutes 10 seconds  Total Procedure Duration: 0 hours 12 minutes 57 seconds  Estimated Blood Loss:  Estimated blood loss was minimal.      [facility]

## 2023-09-03 NOTE — Anesthesia Postprocedure Evaluation (Signed)
 Anesthesia Post Note  Patient: Christine Roberts  Procedure(s) Performed: COLONOSCOPY WITH PROPOFOL POLYPECTOMY  Patient location during evaluation: Endoscopy Anesthesia Type: General Level of consciousness: awake and alert Pain management: pain level controlled Vital Signs Assessment: post-procedure vital signs reviewed and stable Respiratory status: spontaneous breathing, nonlabored ventilation, respiratory function stable and patient connected to nasal cannula oxygen Cardiovascular status: blood pressure returned to baseline and stable Postop Assessment: no apparent nausea or vomiting Anesthetic complications: no   No notable events documented.   Last Vitals:  Vitals:   09/03/23 1032 09/03/23 1106  BP: (!) 92/56 125/73  Pulse: (!) 57 65  Resp: 17 17  Temp:    SpO2: 96% 99%    Last Pain:  Vitals:   09/03/23 1106  TempSrc:   PainSc: 0-No pain                 Cleda Mccreedy Christine Roberts

## 2023-09-03 NOTE — Anesthesia Preprocedure Evaluation (Addendum)
 Anesthesia Evaluation  Patient identified by MRN, date of birth, ID band Patient awake    Reviewed: Allergy & Precautions, NPO status , Patient's Chart, lab work & pertinent test results  History of Anesthesia Complications Negative for: history of anesthetic complications  Airway Mallampati: III  TM Distance: <3 FB Neck ROM: full    Dental  (+) Chipped, Lower Dentures, Upper Dentures   Pulmonary neg shortness of breath, former smoker   Pulmonary exam normal        Cardiovascular Exercise Tolerance: Good hypertension, (-) angina Normal cardiovascular exam     Neuro/Psych TIA Neuromuscular disease CVA  negative psych ROS   GI/Hepatic negative GI ROS, Neg liver ROS,neg GERD  ,,  Endo/Other  diabetes, Type 2, Insulin Dependent    Renal/GU Renal disease  negative genitourinary   Musculoskeletal   Abdominal   Peds  Hematology negative hematology ROS (+)   Anesthesia Other Findings Past Medical History: No date: Diabetes mellitus without complication (HCC) No date: Hyperlipidemia No date: Hypertension No date: Kidney stone No date: Left shoulder pain No date: Obesity No date: Osteoarthritis No date: Ovarian failure 11/12/2021: Squamous cell carcinoma of skin     Comment:  right distal medial calf, EDC No date: Stroke (HCC) No date: Vitamin B12 deficiency (non anemic)  Past Surgical History: No date: ABDOMINAL HYSTERECTOMY No date: CHOLECYSTECTOMY 2015: COLONOSCOPY 03/04/2018: COLONOSCOPY WITH PROPOFOL; N/A     Comment:  Procedure: COLONOSCOPY WITH PROPOFOL;  Surgeon: Toledo,               Boykin Nearing, MD;  Location: [facility] ENDOSCOPY;  Service:               Gastroenterology;  Laterality: N/A; No date: cystoscopic     Comment:  stent placed in ureter 12/2007: LITHOTRIPSY 11/2009: MYRINGOTOMY; Right  BMI    Body Mass Index: 27.79 kg/m      Reproductive/Obstetrics negative OB ROS                               Anesthesia Physical Anesthesia Plan  ASA: 3  Anesthesia Plan: General   Post-op Pain Management:    Induction: Intravenous  PONV Risk Score and Plan: Propofol infusion and TIVA  Airway Management Planned: Natural Airway and Nasal Cannula  Additional Equipment:   Intra-op Plan:   Post-operative Plan:   Informed Consent: I have reviewed the patients History and Physical, chart, labs and discussed the procedure including the risks, benefits and alternatives for the proposed anesthesia with the patient or authorized representative who has indicated his/her understanding and acceptance.     Dental Advisory Given  Plan Discussed with: Anesthesiologist, CRNA and Surgeon  Anesthesia Plan Comments: (Patient consented for risks of anesthesia including but not limited to:  - adverse reactions to medications - risk of airway placement if required - damage to eyes, teeth, lips or other oral mucosa - nerve damage due to positioning  - sore throat or hoarseness - Damage to heart, brain, nerves, lungs, other parts of body or loss of life  Patient voiced understanding and assent.)        Anesthesia Quick Evaluation

## 2023-09-03 NOTE — H&P (Signed)
 Outpatient short stay form Pre-procedure 09/03/2023 9:43 AM Christine Roberts K. Norma Fredrickson, M.D.  Primary Physician: Alba Cory, M.D.  Reason for visit:  Personal history of colon polyps (adenomas).  History of present illness:  Christine Roberts presents to the [facility] GI clinic for chief complaint of high-risk colon cancer surveillance 2/2 personal hx of adenomatous colon polyps and hemorrhoids. Last colonoscopy performed 02/2018 showed left-sided diverticulosis and removed four subcentimeter tubular adenomas. Hemorrhoids continue to be an inconvenience for her. She did see Dr. Hazle Quant in General Surgery back in 2021 to discuss hemorrhoid banding, but she pushed this off at this time because she wasn't ready. She has been dealing with hemorrhoids for many years. She can continue to have issues with intermittent bright red blood on tissue paper after wiping and itching. She denies any rectal burning or rectal pain. Sometimes she will have to push the hemorrhoids back into her anal canal. She reports bowel movements alternate between regular, formed stools to loose stools to hard stools. She typically has a BM every other day. She denies any hematochezia or melena. She denies any abdominal pain or abdominal cramping. She does not regularly take laxatives or stool softeners. Appetite and diet are stable. She has lost weight with use of Ozempic 2 mg weekly (Saturdays). She is tolerating medication well. She has lost appx 50-lbs since being on Ozempic over the past few years.     Current Facility-Administered Medications:    0.9 %  sodium chloride infusion, , Intravenous, Continuous, Andres Vest, Boykin Nearing, MD  Medications Prior to Admission  Medication Sig Dispense Refill Last Dose/Taking   ACCU-CHEK SOFTCLIX LANCETS lancets 1 each by Other route daily. 100 each 1 09/03/2023 Morning   acetaminophen (TYLENOL) 650 MG CR tablet Take 650 mg by mouth every 8 (eight) hours as needed.   Past Week   albuterol (VENTOLIN HFA)  108 (90 Base) MCG/ACT inhaler Inhale 2 puffs into the lungs every 6 (six) hours as needed for wheezing or shortness of breath. 8 g 0 Past Week   Alcohol Swabs (B-D SINGLE USE SWABS REGULAR) PADS 1 each by Does not apply route daily. 400 each 1 Past Week   aspirin EC 81 MG tablet Take 1 tablet (81 mg total) by mouth daily. 90 tablet 0 Past Week   calcium citrate (CALCITRATE - DOSED IN MG ELEMENTAL CALCIUM) 950 MG tablet Take 200 mg of elemental calcium by mouth daily. Has D3 in it as well   Past Week   Cholecalciferol (VITAMIN D) 125 MCG (5000 UT) CAPS Take by mouth. Pt taking 3 times per week   Past Week   Coenzyme Q10 (COQ10) 100 MG CAPS Take 1 capsule by mouth daily.   Past Week   fluticasone (FLONASE) 50 MCG/ACT nasal spray Place 2 sprays into both nostrils daily. 16 g 6 Past Week   glucose blood (TRUE METRIX BLOOD GLUCOSE TEST) test strip TEST BLOOD SUGAR IN THE MORNING, AT NOON, AND AT BEDTIME 200 strip 0 Past Week   insulin degludec (TRESIBA FLEXTOUCH) 200 UNIT/ML FlexTouch Pen Inject 28 Units into the skin daily. Patient receives through [employer] Patient Assistance through Dec 2022 (Patient taking differently: Inject 12 Units into the skin daily. Patient receives through [employer] Patient Assistance through Dec 2022)   09/02/2023   Insulin Pen Needle (NOVOFINE) 30G X 8 MM MISC Inject 10 each into the skin as needed. 100 each 2 09/02/2023   lisinopril (ZESTRIL) 2.5 MG tablet Take 1 tablet (2.5 mg total) by mouth  daily. 90 tablet 1 09/02/2023   loratadine (CLARITIN) 10 MG tablet Take 1 tablet (10 mg total) by mouth at bedtime. 30 tablet 5 Past Week   metFORMIN (GLUCOPHAGE-XR) 750 MG 24 hr tablet Take 1 tablet (750 mg total) by mouth daily with breakfast. 90 tablet 1 Past Week   metroNIDAZOLE (METROCREAM) 0.75 % cream Apply qd/bid at mid face and nose for rosacea 45 g 5 Past Week   mupirocin ointment (BACTROBAN) 2 % Apply 1 Application topically 2 (two) times daily. 22 g 0 Past Week    Semaglutide, 2 MG/DOSE, 8 MG/3ML SOPN Inject 2 mg as directed once a week. 9 mL 0 Past Month   VITAMIN B COMPLEX-C PO Take by mouth.   Past Week   atorvastatin (LIPITOR) 40 MG tablet TAKE 1 TABLET THREE TIMES WEEKLY 36 tablet 0    Blood Glucose Monitoring Suppl (TRUE METRIX AIR GLUCOSE METER) w/Device KIT 1 each by Does not apply route once for 1 dose. 1 kit 0      Allergies  Allergen Reactions   Latex Rash   Erythromycin Other (See Comments)    Strawberry tongue     Past Medical History:  Diagnosis Date   Diabetes mellitus without complication (HCC)    Hyperlipidemia    Hypertension    Kidney stone    Left shoulder pain    Obesity    Osteoarthritis    Ovarian failure    Squamous cell carcinoma of skin 11/12/2021   right distal medial calf, EDC   Stroke (HCC)    Vitamin B12 deficiency (non anemic)     Review of systems:  Otherwise negative.    Physical Exam  Gen: Alert, oriented. Appears stated age.  HEENT: [place]/AT. PERRLA. Lungs: CTA, no wheezes. CV: RR nl S1, S2. Abd: soft, benign, no masses. BS+ Ext: No edema. Pulses 2+    Planned procedures: Proceed with colonoscopy. The patient understands the nature of the planned procedure, indications, risks, alternatives and potential complications including but not limited to bleeding, infection, perforation, damage to internal organs and possible oversedation/side effects from anesthesia. The patient agrees and gives consent to proceed.  Please refer to procedure notes for findings, recommendations and patient disposition/instructions.     Jailynn Lavalais K. Norma Fredrickson, M.D. Gastroenterology 09/03/2023  9:43 AM

## 2023-09-03 NOTE — Interval H&P Note (Signed)
 History and Physical Interval Note:  09/03/2023 9:45 AM  Christine Roberts  has presented today for surgery, with the diagnosis of H/O TA Polyps.  The various methods of treatment have been discussed with the patient and family. After consideration of risks, benefits and other options for treatment, the patient has consented to  Procedure(s): COLONOSCOPY WITH PROPOFOL (N/A) as a surgical intervention.  The patient's history has been reviewed, patient examined, no change in status, stable for surgery.  I have reviewed the patient's chart and labs.  Questions were answered to the patient's satisfaction.     [place], [place]

## 2023-09-03 NOTE — Anesthesia Procedure Notes (Signed)
 Date/Time: 09/03/2023 9:56 AM  Performed by: Ginger Carne, CRNAPre-anesthesia Checklist: Patient identified, Emergency Drugs available, Suction available, Patient being monitored and Timeout performed Patient Re-evaluated:Patient Re-evaluated prior to induction Oxygen Delivery Method: Nasal cannula Preoxygenation: Pre-oxygenation with 100% oxygen Induction Type: IV induction

## 2023-09-04 LAB — SURGICAL PATHOLOGY

## 2023-09-25 ENCOUNTER — Ambulatory Visit (INDEPENDENT_AMBULATORY_CARE_PROVIDER_SITE_OTHER): Payer: Medicare HMO

## 2023-09-25 DIAGNOSIS — Z0000 Encounter for general adult medical examination without abnormal findings: Secondary | ICD-10-CM

## 2023-09-25 NOTE — Progress Notes (Signed)
 Subjective:   Christine Roberts is a 78 y.o. who presents for a Medicare Wellness preventive visit.  Visit Complete: Virtual I connected with  [name] on 09/25/23 by a audio enabled telemedicine application and verified that I am speaking with the correct person using two identifiers.  Patient Location: Home  Provider Location: Office/Clinic  I discussed the limitations of evaluation and management by telemedicine. The patient expressed understanding and agreed to proceed.  Vital Signs: Because this visit was a virtual/telehealth visit, some criteria may be missing or patient reported. Any vitals not documented were not able to be obtained and vitals that have been documented are patient reported.  VideoError- Librarian, academic were attempted between this provider and patient, however failed, due to patient having technical difficulties OR patient did not have access to video capability.  We continued and completed visit with audio only.   Persons Participating in Visit: Patient.  AWV Questionnaire: No: Patient Medicare AWV questionnaire was not completed prior to this visit.  Cardiac Risk Factors include: advanced age (>24men, >49 women);dyslipidemia;hypertension;diabetes mellitus;microalbuminuria     Objective:    There were no vitals filed for this visit. There is no height or weight on file to calculate BMI.     09/25/2023    3:22 PM 09/03/2023    9:34 AM 09/13/2022    3:45 PM 04/18/2022    2:36 PM 08/23/2021    2:54 PM 08/22/2020    3:23 PM 07/27/2019   10:57 AM  Advanced Directives  Does Patient Have a Medical Advance Directive? No Yes Yes No Yes Yes Yes  Type of Furniture conservator/restorer;Living will   Healthcare Power of [place];Living will Healthcare Power of [place];Living will Healthcare Power of [place];Living will  Copy of Healthcare Power of Attorney in Chart?     No - copy requested No - copy requested No - copy  requested  Would patient like information on creating a medical advance directive? No - Patient declined          Current Medications (verified) Outpatient Encounter Medications as of 09/25/2023  Medication Sig   ACCU-CHEK SOFTCLIX LANCETS lancets 1 each by Other route daily.   acetaminophen (TYLENOL) 650 MG CR tablet Take 650 mg by mouth every 8 (eight) hours as needed.   albuterol (VENTOLIN HFA) 108 (90 Base) MCG/ACT inhaler Inhale 2 puffs into the lungs every 6 (six) hours as needed for wheezing or shortness of breath.   Alcohol Swabs (B-D SINGLE USE SWABS REGULAR) PADS 1 each by Does not apply route daily.   aspirin EC 81 MG tablet Take 1 tablet (81 mg total) by mouth daily.   atorvastatin (LIPITOR) 40 MG tablet TAKE 1 TABLET THREE TIMES WEEKLY   Biotin 1000 MCG CHEW Chew 1,000 mg by mouth daily.   calcium citrate (CALCITRATE - DOSED IN MG ELEMENTAL CALCIUM) 950 MG tablet Take 200 mg of elemental calcium by mouth daily. Has D3 in it as well   Cholecalciferol (VITAMIN D) 125 MCG (5000 UT) CAPS Take by mouth. Pt taking 3 times per week   Coenzyme Q10 (COQ10) 100 MG CAPS Take 1 capsule by mouth daily.   fluticasone (FLONASE) 50 MCG/ACT nasal spray Place 2 sprays into both nostrils daily.   glucose blood (TRUE METRIX BLOOD GLUCOSE TEST) test strip TEST BLOOD SUGAR IN THE MORNING, AT NOON, AND AT BEDTIME   insulin degludec (TRESIBA FLEXTOUCH) 200 UNIT/ML FlexTouch Pen Inject 28 Units into the  skin daily. Patient receives through [employer] Patient Assistance through Dec 2022 (Patient taking differently: Inject 12 Units into the skin daily. Patient receives through [employer] Patient Assistance through Dec 2022)   Insulin Pen Needle (NOVOFINE) 30G X 8 MM MISC Inject 10 each into the skin as needed.   lisinopril (ZESTRIL) 2.5 MG tablet Take 1 tablet (2.5 mg total) by mouth daily.   metFORMIN (GLUCOPHAGE-XR) 750 MG 24 hr tablet Take 1 tablet (750 mg total) by mouth daily with breakfast.    metroNIDAZOLE (METROCREAM) 0.75 % cream Apply qd/bid at mid face and nose for rosacea   mupirocin ointment (BACTROBAN) 2 % Apply 1 Application topically 2 (two) times daily.   Semaglutide, 2 MG/DOSE, 8 MG/3ML SOPN Inject 2 mg as directed once a week.   VITAMIN B COMPLEX-C PO Take by mouth.   Blood Glucose Monitoring Suppl (TRUE METRIX AIR GLUCOSE METER) w/Device KIT 1 each by Does not apply route once for 1 dose.   loratadine (CLARITIN) 10 MG tablet Take 1 tablet (10 mg total) by mouth at bedtime. (Patient not taking: Reported on 09/25/2023)   No facility-administered encounter medications on file as of 09/25/2023.    Allergies (verified) Latex and Erythromycin   History: Past Medical History:  Diagnosis Date   Diabetes mellitus without complication (HCC)    Hyperlipidemia    Hypertension    Kidney stone    Left shoulder pain    Obesity    Osteoarthritis    Ovarian failure    Squamous cell carcinoma of skin 11/12/2021   right distal medial calf, EDC   Stroke (HCC)    Vitamin B12 deficiency (non anemic)    Past Surgical History:  Procedure Laterality Date   ABDOMINAL HYSTERECTOMY     CHOLECYSTECTOMY     COLONOSCOPY  2015   COLONOSCOPY WITH PROPOFOL N/A 03/04/2018   Procedure: COLONOSCOPY WITH PROPOFOL;  Surgeon: Toledo, Boykin Nearing, MD;  Location: [facility] ENDOSCOPY;  Service: Gastroenterology;  Laterality: N/A;   COLONOSCOPY WITH PROPOFOL N/A 09/03/2023   Procedure: COLONOSCOPY WITH PROPOFOL;  Surgeon: Toledo, Boykin Nearing, MD;  Location: [facility] ENDOSCOPY;  Service: Gastroenterology;  Laterality: N/A;   cystoscopic     stent placed in ureter   LITHOTRIPSY  12/2007   MYRINGOTOMY Right 11/2009   POLYPECTOMY  09/03/2023   Procedure: POLYPECTOMY;  Surgeon: Toledo, Boykin Nearing, MD;  Location: [facility] ENDOSCOPY;  Service: Gastroenterology;;   Family History  Problem Relation Age of Onset   Cancer Mother    Diabetes Mother    Diabetes Daughter    CAD Father    Diabetes Daughter         hypoglycemia   Social History   Socioeconomic History   Marital status: Widowed    Spouse name: Not on file   Number of children: 2   Years of education: Not on file   Highest education level: Associate degree: occupational, Scientist, product/process development, or vocational program  Occupational History    Employer: [employer]    Comment: retired  Tobacco Use   Smoking status: Former    Current packs/day: 0.50    Average packs/day: 0.5 packs/day for 10.0 years (5.0 ttl pk-yrs)    Types: Cigarettes    Passive exposure: Past   Smokeless tobacco: Never   Tobacco comments:    smoking cessation materials not required.  Quit 30 years ago.  Vaping Use   Vaping status: Never Used  Substance and Sexual Activity   Alcohol use: Yes  Alcohol/week: 1.0 standard drink of alcohol    Types: 1 Shots of liquor per week    Comment: socially   Drug use: No   Sexual activity: Not Currently  Other Topics Concern   Not on file  Social History Narrative   Pt lives alone   Social Drivers of Health   Financial Resource Strain: Low Risk  (09/25/2023)   Overall Financial Resource Strain (CARDIA)    Difficulty of Paying Living Expenses: Not hard at all  Recent Concern: Financial Resource Strain - Medium Risk (07/15/2023)   Received from [facility]   Overall Financial Resource Strain (CARDIA)    Difficulty of Paying Living Expenses: Somewhat hard  Food Insecurity: No Food Insecurity (09/25/2023)   Hunger Vital Sign    Worried About Running Out of Food in the Last Year: Never true    Ran Out of Food in the Last Year: Never true  Recent Concern: Food Insecurity - Food Insecurity Present (07/15/2023)   Received from [facility]   Hunger Vital Sign    Worried About Running Out of Food in the Last Year: Never true    Ran Out of Food in the Last Year: Sometimes true  Transportation Needs: No Transportation Needs (09/25/2023)   PRAPARE - Scientist, research (physical sciences) (Medical): No    Lack of Transportation (Non-Medical): No  Physical Activity: Insufficiently Active (09/25/2023)   Exercise Vital Sign    Days of Exercise per Week: 3 days    Minutes of Exercise per Session: 40 min  Stress: No Stress Concern Present (09/25/2023)   [employer] of Occupational Health - Occupational Stress Questionnaire    Feeling of Stress : Only a little  Social Connections: Moderately Isolated (09/25/2023)   Social Connection and Isolation Panel [NHANES]    Frequency of Communication with Friends and Family: More than three times a week    Frequency of Social Gatherings with Friends and Family: More than three times a week    Attends Religious Services: More than 4 times per year    Active Member of [employer] or Organizations: No    Attends Banker Meetings: Never    Marital Status: Widowed    Tobacco Counseling Counseling given: Not Answered Tobacco comments: smoking cessation materials not required.  Quit 30 years ago.    Clinical Intake:  Pre-visit preparation completed: Yes  Pain : No/denies pain     BMI - recorded: 27.5 Nutritional Status: BMI 25 -29 Overweight Nutritional Risks: None Diabetes: Yes CBG done?: No Did pt. bring in CBG monitor from home?: No  Lab Results  Component Value Date   HGBA1C 6.9 (A) 06/23/2023   HGBA1C 7.0 (A) 02/21/2023   HGBA1C 6.8 (A) 10/18/2022     How often do you need to have someone help you when you read instructions, pamphlets, or other written materials from your doctor or pharmacy?: 1 - Never  Interpreter Needed?: No  Information entered by :: Kennedy Bucker, LPN   Activities of Daily Living     09/25/2023    3:22 PM 02/21/2023    1:35 PM  In your present state of health, do you have any difficulty performing the following activities:  Hearing? 0 0  Vision? 0 0  Difficulty concentrating or making decisions? 0 0  Walking or climbing stairs? 0 0  Dressing or bathing? 0 0   Doing errands, shopping? 0 0  Preparing Food and eating ? N  Using the Toilet? N   In the past six months, have you accidently leaked urine? N   Do you have problems with loss of bowel control? N   Managing your Medications? N   Managing your Finances? N   Housekeeping or managing your Housekeeping? N     Patient Care Team: Alba Cory, MD as PCP - General (Family Medicine) Dingeldein, Viviann Spare, MD as Consulting Physician (Ophthalmology) Sondra Come, MD as Consulting Physician (Urology) Stanton Kidney, MD as Consulting Physician (Gastroenterology) Carman Ching, PA-C as Physician Assistant (Urology) Ronney Asters, Jackelyn Poling, RPH-CPP as Pharmacist  Indicate any recent Medical Services you may have received from other than [facility] providers in the past year (date may be approximate).     Assessment:   This is a routine wellness examination for [place].  Hearing/Vision screen Hearing Screening - Comments:: NO AIDS Vision Screening - Comments:: READERS- [facility]   Goals Addressed             This Visit's Progress    DIET - EAT MORE FRUITS AND VEGETABLES         Depression Screen     09/25/2023    3:20 PM 06/23/2023    1:45 PM 02/21/2023    1:35 PM 01/06/2023    3:21 PM 10/18/2022    1:51 PM 09/17/2022   10:45 AM 09/13/2022    3:38 PM  PHQ 2/9 Scores  PHQ - 2 Score 0 0 0 0 0 0 0  PHQ- 9 Score 0  0 0 0 0     Fall Risk     09/25/2023    3:22 PM 06/23/2023    1:45 PM 02/21/2023    1:35 PM 01/06/2023    3:20 PM 10/18/2022    1:51 PM  Fall Risk   Falls in the past year? 0 0 0 0 0  Number falls in past yr: 0 0 0 0   Injury with Fall? 0 0 0 0   Risk for fall due to : No Fall Risks No Fall Risks No Fall Risks No Fall Risks No Fall Risks  Follow up Falls prevention discussed;Falls evaluation completed Falls prevention discussed;Education provided;Falls evaluation completed Falls prevention discussed Falls evaluation completed Falls prevention discussed     MEDICARE RISK AT HOME:  Medicare Risk at Home Any stairs in or around the home?: Yes If so, are there any without handrails?: No Home free of loose throw rugs in walkways, pet beds, electrical cords, etc?: Yes Adequate lighting in your home to reduce risk of falls?: Yes Life alert?: No Use of a cane, walker or w/c?: No Grab bars in the bathroom?: Yes Shower chair or bench in shower?: No Elevated toilet seat or a handicapped toilet?: Yes  TIMED UP AND GO:  Was the test performed?  No  Cognitive Function: 6CIT completed        09/25/2023    3:24 PM 09/13/2022    3:47 PM 07/27/2019   11:01 AM 07/21/2018   11:11 AM 07/18/2017    3:23 PM  6CIT Screen  What Year? 0 points 0 points 0 points 0 points 0 points  What month? 0 points 0 points 0 points 0 points 0 points  What time? 0 points 0 points 0 points 0 points 0 points  Count back from 20 0 points 0 points 0 points 0 points 0 points  Months in reverse 0 points 0 points 0 points 0 points 0 points  Repeat phrase 0 points 0  points 0 points 0 points 0 points  Total Score 0 points 0 points 0 points 0 points 0 points    Immunizations Immunization History  Administered Date(s) Administered   Fluad Quad(high Dose 65+) 03/24/2019   Fluad Trivalent(High Dose 65+) 03/25/2023   Influenza Split 04/11/2009   Influenza, High Dose Seasonal PF 03/21/2015, 05/10/2016, 03/30/2017, 03/30/2018   Influenza, Seasonal, Injecte, Preservative Fre 04/07/2012   Influenza,inj,Quad PF,6+ Mos 03/07/2014   Influenza-Unspecified 03/07/2014, 03/30/2017, 05/15/2020, 05/30/2021, 05/23/2022   PFIZER(Purple Top)SARS-COV-2 Vaccination 07/15/2019, 08/05/2019, 06/27/2020   PNEUMOCOCCAL CONJUGATE-20 06/23/2023   Pfizer Covid-19 Vaccine Bivalent Booster 19yrs & up 12/26/2020, 05/23/2022   Pneumococcal Conjugate-13 03/21/2015   Pneumococcal Polysaccharide-23 10/25/2009   Tdap 10/25/2009, 03/14/2018, 01/06/2023   Zoster, Live 09/24/2011    Screening  Tests Health Maintenance  Topic Date Due   Zoster Vaccines- Shingrix (1 of 2) 07/13/1964   OPHTHALMOLOGY EXAM  04/02/2019   COVID-19 Vaccine (6 - 2024-25 season) 03/09/2023   MAMMOGRAM  06/22/2024 (Originally 10/31/2021)   FOOT EXAM  10/18/2023   HEMOGLOBIN A1C  12/22/2023   Diabetic kidney evaluation - eGFR measurement  02/21/2024   Diabetic kidney evaluation - Urine ACR  02/21/2024   Medicare Annual Wellness (AWV)  09/24/2024   DEXA SCAN  10/31/2025   Colonoscopy  09/02/2028   DTaP/Tdap/Td (4 - Td or Tdap) 01/05/2033   Pneumonia Vaccine 65+ Years old  Completed   INFLUENZA VACCINE  Completed   Hepatitis C Screening  Completed   HPV VACCINES  Aged Out    Health Maintenance  Health Maintenance Due  Topic Date Due   Zoster Vaccines- Shingrix (1 of 2) 07/13/1964   OPHTHALMOLOGY EXAM  04/02/2019   COVID-19 Vaccine (6 - 2024-25 season) 03/09/2023   Health Maintenance Items Addressed: MAMMOGRAM WAS ORDERED  12/24 COLONOSCOPY UP TO DATE, BDS UP TO DATE  Additional Screening:  Vision Screening: Recommended annual ophthalmology exams for early detection of glaucoma and other disorders of the eye.  Dental Screening: Recommended annual dental exams for proper oral hygiene  Community Resource Referral / Chronic Care Management: CRR required this visit?  No   CCM required this visit?  No     Plan:     I have personally reviewed and noted the following in the patient's chart:   Medical and social history Use of alcohol, tobacco or illicit drugs  Current medications and supplements including opioid prescriptions. Patient is not currently taking opioid prescriptions. Functional ability and status Nutritional status Physical activity Advanced directives List of other physicians Hospitalizations, surgeries, and ER visits in previous 12 months Vitals Screenings to include cognitive, depression, and falls Referrals and appointments  In addition, I have reviewed and  discussed with patient certain preventive protocols, quality metrics, and best practice recommendations. A written personalized care plan for preventive services as well as general preventive health recommendations were provided to patient.     Hal Hope, LPN   02/15/9146   After Visit Summary: (MyChart) Due to this being a telephonic visit, the after visit summary with patients personalized plan was offered to patient via MyChart   Notes: Nothing significant to report at this time.

## 2023-09-25 NOTE — Patient Instructions (Addendum)
 Ms. Kellen , Thank you for taking time to come for your Medicare Wellness Visit. I appreciate your ongoing commitment to your health goals. Please review the following plan we discussed and let me know if I can assist you in the future.   Referrals/Orders/Follow-Ups/Clinician Recommendations: NONE  This is a list of the screening recommended for you and due dates:  Health Maintenance  Topic Date Due   Zoster (Shingles) Vaccine (1 of 2) 07[birth year]13/1964   Eye exam for diabetics  04/02/2019   COVID-19 Vaccine (6 - 2024-25 season) 03/09/2023   Mammogram  06/22/2024*   Complete foot exam   10/18/2023   Hemoglobin A1C  12/22/2023   Yearly kidney function blood test for diabetes  02/21/2024   Yearly kidney health urinalysis for diabetes  02/21/2024   Medicare Annual Wellness Visit  09/24/2024   DEXA scan (bone density measurement)  10/31/2025   Colon Cancer Screening  09/02/2028   DTaP/Tdap/Td vaccine (4 - Td or Tdap) 01/05/2033   Pneumonia Vaccine  Completed   Flu Shot  Completed   Hepatitis C Screening  Completed   HPV Vaccine  Aged Out  *Topic was postponed. The date shown is not the original due date.    Advanced directives: (ACP Link)Information on Advanced Care Planning can be found at [facility] Advance Health Care Directives Advance Health Care Directives. [email or web address]   Next Medicare Annual Wellness Visit scheduled for next year: Yes   10/01/23 @ 3:10 PM BY PHONE

## 2023-10-07 ENCOUNTER — Other Ambulatory Visit: Payer: Self-pay

## 2023-10-07 DIAGNOSIS — E785 Hyperlipidemia, unspecified: Secondary | ICD-10-CM

## 2023-10-07 MED ORDER — ATORVASTATIN CALCIUM 40 MG PO TABS
40.0000 mg | ORAL_TABLET | ORAL | 1 refills | Status: DC
Start: 2023-10-07 — End: 2023-10-13

## 2023-10-07 NOTE — Telephone Encounter (Signed)
 Previous prescription was only 36 tablets FYI pt has an upcoming appt on 10/27/2023

## 2023-10-07 NOTE — Telephone Encounter (Signed)
 Pt needs refill on  atorvastatin (LIPITOR) 40 MG tablet   [facility] [address]

## 2023-10-13 ENCOUNTER — Other Ambulatory Visit: Payer: Self-pay

## 2023-10-13 DIAGNOSIS — E785 Hyperlipidemia, unspecified: Secondary | ICD-10-CM

## 2023-10-13 MED ORDER — ATORVASTATIN CALCIUM 40 MG PO TABS: 40.0000 mg | ORAL_TABLET | ORAL | 1 refills | Status: DC

## 2023-10-27 ENCOUNTER — Encounter: Payer: Self-pay | Admitting: Family Medicine

## 2023-10-27 ENCOUNTER — Ambulatory Visit: Payer: Self-pay | Admitting: Family Medicine

## 2023-10-27 VITALS — BP 124/78 | HR 99 | Temp 97.9°F | Resp 18 | Ht 63.5 in | Wt 159.2 lb

## 2023-10-27 DIAGNOSIS — Z7984 Long term (current) use of oral hypoglycemic drugs: Secondary | ICD-10-CM

## 2023-10-27 DIAGNOSIS — I700 Atherosclerosis of aorta: Secondary | ICD-10-CM | POA: Diagnosis not present

## 2023-10-27 DIAGNOSIS — E559 Vitamin D deficiency, unspecified: Secondary | ICD-10-CM | POA: Diagnosis not present

## 2023-10-27 DIAGNOSIS — E538 Deficiency of other specified B group vitamins: Secondary | ICD-10-CM

## 2023-10-27 DIAGNOSIS — Z7985 Long-term (current) use of injectable non-insulin antidiabetic drugs: Secondary | ICD-10-CM | POA: Diagnosis not present

## 2023-10-27 DIAGNOSIS — R251 Tremor, unspecified: Secondary | ICD-10-CM

## 2023-10-27 DIAGNOSIS — E1121 Type 2 diabetes mellitus with diabetic nephropathy: Secondary | ICD-10-CM

## 2023-10-27 DIAGNOSIS — N1831 Chronic kidney disease, stage 3a: Secondary | ICD-10-CM

## 2023-10-27 DIAGNOSIS — Z8673 Personal history of transient ischemic attack (TIA), and cerebral infarction without residual deficits: Secondary | ICD-10-CM

## 2023-10-27 DIAGNOSIS — I10 Essential (primary) hypertension: Secondary | ICD-10-CM | POA: Diagnosis not present

## 2023-10-27 LAB — POCT GLYCOSYLATED HEMOGLOBIN (HGB A1C): Hemoglobin A1C: 6.9 % — AB (ref 4.0–5.6)

## 2023-10-27 MED ORDER — METFORMIN HCL ER 750 MG PO TB24: 750.0000 mg | ORAL_TABLET | Freq: Every day | ORAL | 1 refills | Status: DC

## 2023-10-27 MED ORDER — LISINOPRIL 2.5 MG PO TABS
2.5000 mg | ORAL_TABLET | Freq: Every day | ORAL | 1 refills | Status: DC
Start: 2023-10-27 — End: 2024-04-26

## 2023-10-27 NOTE — Progress Notes (Signed)
 Name: Christine Roberts   MRN: [id number]    DOB: 1946/03/04   Date:10/27/2023       Progress Note  Subjective  Chief Complaint  Chief Complaint  Patient presents with   Medical Management of Chronic Issues   Hyperlipidemia   Diabetes   Discussed the use of AI scribe software for clinical note transcription with the patient, who gave verbal consent to proceed.  History of Present Illness Christine Roberts is a 78 year old female with diabetes, hypertension, and chronic kidney disease who presents for routine follow-up.  Her diabetes management includes an A1c of 6.9, consistent with her last visit in December. She is on Ozempic  2 mg , which has stabilized her glucose levels, and takes Tresiba , 14 units, occasionally reducing it to 12 units. Her morning blood sugar ranges from 85 to 109. She also takes Metformin  750 mg daily She experiences some dizziness upon standing, attributed to getting up too quickly in am's  She has a history of nephropathy and is on a low dose of lisinopril  for kidney protection. Her blood pressure is well controlled, and she has not experienced any significant dizziness  Her GFR was normal in August, and protein in the urine has normalized.  She has dyslipidemia and is on atorvastatin , which she takes four times a week due to previous leg cramps. Her cholesterol levels have fluctuated, with the most recent LDL being 88. She also takes a baby aspirin  due to a history of TIA but has not experienced any stroke symptoms recently.  She feels more tired and weak, especially during physical activities like yard work, but denies shortness of breath or chest pain. She attributes this to aging and notes that she has not had any respiratory illnesses this winter.  She experiences some neuropathy in her feet, describing a difference in sensation but no significant numbness or pain. No significant issues with hunger, thirst, or frequent urination related to her diabetes.  She has  essential tremors, which have been more noticeable, but she has not sought further evaluation for this. Her memory is generally good, with occasional forgetfulness.   Patient Active Problem List   Diagnosis Date Noted   Atherosclerosis of aorta (HCC) 05/25/2018   Seasonal allergic rhinitis 09/12/2015   TIA (transient ischemic attack) 05/24/2015   Senile purpura (HCC) 05/09/2015   Benign essential HTN 03/18/2015   Pain in shoulder 03/18/2015   Dyslipidemia 03/18/2015   History of surgery to major organs, presenting hazards to health 03/18/2015   Hearing loss 03/18/2015   Hiatal hernia 03/18/2015   History of colon polyps 03/18/2015   Calculus of kidney 03/18/2015   B12 deficiency 03/18/2015   Microalbuminuria 03/18/2015   Adult BMI 30+ 03/18/2015   Arthritis, degenerative 03/18/2015   Diabetes mellitus with nephropathy (HCC) 03/18/2015    Past Surgical History:  Procedure Laterality Date   ABDOMINAL HYSTERECTOMY     CHOLECYSTECTOMY     COLONOSCOPY  2015   COLONOSCOPY WITH PROPOFOL  N/A 03/04/2018   Procedure: COLONOSCOPY WITH PROPOFOL ;  Surgeon: Toledo, Alphonsus Jeans, MD;  Location: [facility];  Service: Gastroenterology;  Laterality: N/A;   COLONOSCOPY WITH PROPOFOL  N/A 09/03/2023   Procedure: COLONOSCOPY WITH PROPOFOL ;  Surgeon: Toledo, Alphonsus Jeans, MD;  Location: [facility];  Service: Gastroenterology;  Laterality: N/A;   cystoscopic     stent placed in ureter   LITHOTRIPSY  12/2007   MYRINGOTOMY Right 11/2009   POLYPECTOMY  09/03/2023   Procedure: POLYPECTOMY;  Surgeon: Corky Diener, Teodoro K, MD;  Location: [facility];  Service: Gastroenterology;;    Family History  Problem Relation Age of Onset   Cancer Mother    Diabetes Mother    Diabetes Daughter    CAD Father    Diabetes Daughter        hypoglycemia    Social History   Tobacco Use   Smoking status: Former    Current packs/day: 0.50    Average packs/day: 0.5 packs/day for 10.0 years (5.0 ttl pk-yrs)     Types: Cigarettes    Passive exposure: Past   Smokeless tobacco: Never   Tobacco comments:    smoking cessation materials not required.  Quit 30 years ago.  Substance Use Topics   Alcohol use: Yes    Alcohol/week: 1.0 standard drink of alcohol    Types: 1 Shots of liquor per week    Comment: socially     Current Outpatient Medications:    ACCU-CHEK SOFTCLIX LANCETS lancets, 1 each by Other route daily., Disp: 100 each, Rfl: 1   acetaminophen  (TYLENOL ) 650 MG CR tablet, Take 650 mg by mouth every 8 (eight) hours as needed., Disp: , Rfl:    albuterol  (VENTOLIN  HFA) 108 (90 Base) MCG/ACT inhaler, Inhale 2 puffs into the lungs every 6 (six) hours as needed for wheezing or shortness of breath., Disp: 8 g, Rfl: 0   Alcohol Swabs (B-D SINGLE USE SWABS REGULAR) PADS, 1 each by Does not apply route daily., Disp: 400 each, Rfl: 1   aspirin  EC 81 MG tablet, Take 1 tablet (81 mg total) by mouth daily., Disp: 90 tablet, Rfl: 0   atorvastatin  (LIPITOR) 40 MG tablet, Take 1 tablet (40 mg total) by mouth 4 (four) times a week., Disp: 48 tablet, Rfl: 1   Biotin 1000 MCG CHEW, Chew 1,000 mg by mouth daily., Disp: , Rfl:    calcium  citrate (CALCITRATE - DOSED IN MG ELEMENTAL CALCIUM ) 950 MG tablet, Take 200 mg of elemental calcium  by mouth daily. Has D3 in it as well, Disp: , Rfl:    Cholecalciferol (VITAMIN D ) 125 MCG (5000 UT) CAPS, Take by mouth. Pt taking 3 times per week, Disp: , Rfl:    Coenzyme Q10 (COQ10) 100 MG CAPS, Take 1 capsule by mouth daily., Disp: , Rfl:    fluticasone  (FLONASE ) 50 MCG/ACT nasal spray, Place 2 sprays into both nostrils daily., Disp: 16 g, Rfl: 6   glucose blood (TRUE METRIX BLOOD GLUCOSE TEST) test strip, TEST BLOOD SUGAR IN THE MORNING, AT NOON, AND AT BEDTIME, Disp: 200 strip, Rfl: 0   insulin  degludec (TRESIBA  FLEXTOUCH) 200 UNIT/ML FlexTouch Pen, Inject 28 Units into the skin daily. Patient receives through [employer] Patient Assistance through Dec 2022 (Patient taking  differently: Inject 12 Units into the skin daily. Patient receives through [employer] Patient Assistance through Dec 2022), Disp: , Rfl:    Insulin  Pen Needle (NOVOFINE) 30G X 8 MM MISC, Inject 10 each into the skin as needed., Disp: 100 each, Rfl: 2   metroNIDAZOLE  (METROCREAM ) 0.75 % cream, Apply qd/bid at mid face and nose for rosacea, Disp: 45 g, Rfl: 5   mupirocin  ointment (BACTROBAN ) 2 %, Apply 1 Application topically 2 (two) times daily., Disp: 22 g, Rfl: 0   Semaglutide , 2 MG/DOSE, 8 MG/3ML SOPN, Inject 2 mg as directed once a week., Disp: 9 mL, Rfl: 0   VITAMIN B COMPLEX-C PO, Take by mouth., Disp: , Rfl:    Blood Glucose Monitoring Suppl (TRUE METRIX AIR GLUCOSE METER) w/Device KIT,  1 each by Does not apply route once for 1 dose., Disp: 1 kit, Rfl: 0   lisinopril  (ZESTRIL ) 2.5 MG tablet, Take 1 tablet (2.5 mg total) by mouth daily., Disp: 90 tablet, Rfl: 1   metFORMIN  (GLUCOPHAGE -XR) 750 MG 24 hr tablet, Take 1 tablet (750 mg total) by mouth daily with breakfast., Disp: 90 tablet, Rfl: 1  Allergies  Allergen Reactions   Latex Rash   Erythromycin Other (See Comments)    Strawberry tongue    I personally reviewed active problem list, medication list, allergies, family history with the patient/caregiver today.   ROS  Ten systems reviewed and is negative except as mentioned in HPI    Objective Physical Exam Constitutional: Patient appears well-developed and well-nourished.  No distress.  HEENT: head atraumatic, normocephalic, pupils equal and reactive to light, neck supple Cardiovascular: Normal rate, regular rhythm and normal heart sounds.  No murmur heard. No BLE edema. Pulmonary/Chest: Effort normal and breath sounds normal. No respiratory distress. Abdominal: Soft.  There is no tenderness. Psychiatric: Patient has a normal mood and affect. behavior is normal. Judgment and thought content normal.      Vitals:   10/27/23 1320  BP: 124/78  Pulse: 99  Resp: 18  Temp:  97.9 F (36.6 C)  SpO2: 96%  Weight: 159 lb 3.2 oz (72.2 kg)  Height: 5' 3.5" (1.613 m)    Body mass index is 27.76 kg/m.  Recent Results (from the past 2160 hours)  Surgical pathology     Status: None   Collection Time: 09/03/23 12:00 AM  Result Value Ref Range   SURGICAL PATHOLOGY      SURGICAL PATHOLOGY [facility] [address], [address] [place], [place] [address] Telephone [phone] or [phone] Fax [phone]  REPORT OF SURGICAL PATHOLOGY   Accession #: [id number] Patient Name: CIERRAH, DACE Visit # : [id number]  MRN: [id number] Physician: Mardy Shall DOB/Age [birth year] (Age: 21) Gender: F Collected Date: 09/03/2023 Received Date: 09/03/2023  FINAL DIAGNOSIS       1. Transverse Colon Polyp, Hot snare :       - TUBULAR ADENOMA.      - NEGATIVE FOR HIGH-GRADE DYSPLASIA AND MALIGNANCY.       2. Transverse Colon Polyp, CBX distal :       - TUBULAR ADENOMA.      - NEGATIVE FOR HIGH-GRADE DYSPLASIA AND MALIGNANCY.       3. Descending Colon Polyp, CBX :       - HYPERPLASTIC POLYP.      - NEGATIVE FOR DYSPLASIA AND MALIGNANCY.       ELECTRONIC SIGNATURE : Rubinas Md, Alexandria Ida , Sports administrator, International aid/development worker  MICROSCOPIC DESCRIPTION  CASE COMMENTS STAINS USED IN DIAGNOSIS: H&E H&E H&E    CLIN ICAL HISTORY  SPECIMEN(S) OBTAINED 1. Transverse Colon Polyp, Hot Snare 2. Transverse Colon Polyp, CBX Distal 3. Descending Colon Polyp, CBX  SPECIMEN COMMENTS: SPECIMEN CLINICAL INFORMATION: 1. H/O TA polyps.Colon polyps, hemorrhoids, diverticulosis    Gross Description 1. "Hot snare transverse colon polyp", received in formalin is a single 0.3 x 0.2 x 0.2 cm pale tan tissue fragment.The specimen is submitted in toto in 1 block (1A). 2. "CBX distal transverse colon polyp", received in formalin are 2 tan-pink tissue fragments that are 0.6 x 0.2 x 0.1 cm each.The specimen is submitted in toto in 1 block (2A). 3. "CBX  descending colon polyp", received in formalin are 2 tan-pink tissue fragments that are 0.1 x 0.1 x  0.1 cm and 0.8 x 0.2 x 0.1 cm.The specimen is submitted in toto in 1 block (3A).      AMG 09/03/2023        Report signed out from the following location(s) [place] [address] [place], [place] [address] CLIA #: [id number]  [facility] [address] [place], [place] [address] CLIA #: [id number]   Glucose, capillary     Status: Abnormal   Collection Time: 09/03/23  9:41 AM  Result Value Ref Range   Glucose-Capillary 69 (L) 70 - 99 mg/dL    Comment: Glucose reference range applies only to samples taken after fasting for at least 8 hours.   Comment 1 IN EPIC   Glucose, capillary     Status: Abnormal   Collection Time: 09/03/23 10:42 AM  Result Value Ref Range   Glucose-Capillary 67 (L) 70 - 99 mg/dL    Comment: Glucose reference range applies only to samples taken after fasting for at least 8 hours.  Glucose, capillary     Status: None   Collection Time: 09/03/23 11:03 AM  Result Value Ref Range   Glucose-Capillary 70 70 - 99 mg/dL    Comment: Glucose reference range applies only to samples taken after fasting for at least 8 hours.  POCT HgB A1C     Status: Abnormal   Collection Time: 10/27/23  1:36 PM  Result Value Ref Range   Hemoglobin A1C 6.9 (A) 4.0 - 5.6 %   HbA1c POC (<> result, manual entry)     HbA1c, POC (prediabetic range)     HbA1c, POC (controlled diabetic range)      Diabetic Foot Exam:  Diabetic foot exam was performed with the following findings:   No deformities, ulcerations, or other skin breakdown Normal sensation of 10g monofilament Intact posterior tibialis and dorsalis pedis pulses      PHQ2/9:    09/25/2023    3:20 PM 06/23/2023    1:45 PM 02/21/2023    1:35 PM 01/06/2023    3:21 PM 10/18/2022    1:51 PM  Depression screen PHQ 2/9  Decreased Interest 0 0 0 0 0  Down, Depressed, Hopeless 0 0 0 0 0   PHQ - 2 Score 0 0 0 0 0  Altered sleeping 0  0 0 0  Tired, decreased energy 0  0 0 0  Change in appetite 0  0 0 0  Feeling bad or failure about yourself  0  0 0 0  Trouble concentrating 0  0 0 0  Moving slowly or fidgety/restless 0  0 0 0  Suicidal thoughts 0  0 0 0  PHQ-9 Score 0  0 0 0  Difficult doing work/chores Not difficult at all   Not difficult at all     phq 9 is negative  Fall Risk:    09/25/2023    3:22 PM 06/23/2023    1:45 PM 02/21/2023    1:35 PM 01/06/2023    3:20 PM 10/18/2022    1:51 PM  Fall Risk   Falls in the past year? 0 0 0 0 0  Number falls in past yr: 0 0 0 0   Injury with Fall? 0 0 0 0   Risk for fall due to : No Fall Risks No Fall Risks No Fall Risks No Fall Risks No Fall Risks  Follow up Falls prevention discussed;Falls evaluation completed Falls prevention discussed;Education provided;Falls evaluation completed Falls prevention discussed Falls  evaluation completed Falls prevention discussed     Assessment and Plan Assessment & Plan Type 2 diabetes mellitus with nephropathy Diabetes well-managed with A1c at 6.9%. Nephropathy controlled with lisinopril . Mild neuropathy symptoms present but not bothersome. - Continue Tresiba , adjust dose to 12 units as needed. - Continue metformin  750 mg daily. - Continue Ozempic  2 mg weekly. - Monitor blood sugar levels regularly. - Continue lisinopril  for kidney protection. - Encourage hydration and monitor for neuropathy symptoms.  Hypertension Hypertension controlled with lisinopril . Occasional dizziness likely due to orthostatic hypotension. - Continue lisinopril  for blood pressure control and kidney protection. - Advise to sit on the edge of the bed before standing to prevent dizziness. - Monitor blood pressure regularly.  Dyslipidemia Dyslipidemia managed with atorvastatin . LDL at 88 mg/dL, target below 70 mg/dL due to TIA history. - Continue atorvastatin , aim for LDL cholesterol below 70 mg/dL. - Recheck  cholesterol levels in August. - Continue baby aspirin  due to TIA history.  Atherosclerosis of aorta Atherosclerosis contributes to orthostatic hypotension. No new symptoms reported. - Continue current management with atorvastatin  and lisinopril . - Advise to avoid rapid position changes to prevent dizziness.  Transient ischemic attack (TIA) No new symptoms reported. On baby aspirin  for secondary prevention. - Continue baby aspirin  for secondary prevention of TIA. - Monitor for any new neurological symptoms.  Essential tremor Increased tremor activity reported, no significant impact on daily activities. - Discuss potential referral to a specialist if symptoms worsen.  General Health Maintenance Up to date with colonoscopy, mammogram scheduled. Encouraged to increase physical activity. - Encourage participation in physical activity, such as Silver Sneakers classes. - Ensure adequate hydration, especially during physical activity. - Schedule and attend eye exam.  Follow-up - Follow up in four months. - Ensure prescriptions are filled at the pharmacy.

## 2023-10-28 ENCOUNTER — Encounter: Payer: Self-pay | Admitting: Family Medicine

## 2023-10-28 DIAGNOSIS — Z1231 Encounter for screening mammogram for malignant neoplasm of breast: Secondary | ICD-10-CM | POA: Diagnosis not present

## 2023-10-28 LAB — HM MAMMOGRAPHY

## 2023-11-03 DIAGNOSIS — E119 Type 2 diabetes mellitus without complications: Secondary | ICD-10-CM | POA: Diagnosis not present

## 2023-11-03 DIAGNOSIS — H2513 Age-related nuclear cataract, bilateral: Secondary | ICD-10-CM | POA: Diagnosis not present

## 2023-11-03 LAB — HM DIABETES EYE EXAM

## 2024-02-02 ENCOUNTER — Other Ambulatory Visit: Payer: Self-pay | Admitting: Pharmacist

## 2024-02-02 ENCOUNTER — Encounter: Payer: Self-pay | Admitting: Pharmacist

## 2024-02-02 DIAGNOSIS — E1121 Type 2 diabetes mellitus with diabetic nephropathy: Secondary | ICD-10-CM

## 2024-02-02 NOTE — Progress Notes (Signed)
   02/02/2024  Patient ID: Inocente JAYSON Daring, female   DOB: 1945-08-11, 78 y.o.   MRN: [id number]  Patient enrolled in patient assistance program from [employer].  As of 7/25, Ozempic  and NovoFine Pen needles will no longer be eligible for automatic refill from [employer]. To request refills after this date, program must receive a refill request form from office up to 30 days prior to refill being due.  Outreach to patient today and provide this update. Advise patient to monitor supply of Ozempic  at home and when has only a 1 month supply remaining, to contact Shasta Spear: Phone: [phone] to request CPhT start refill form with PCP to be sent to program.  Sharyle Sia, PharmD, [facility] Clinical Pharmacist [facility] [phone]

## 2024-02-02 NOTE — Patient Instructions (Signed)
 As of 7/25, Ozempic  and NovoFine pen needles will no longer be automatically refilled from [employer] patient assistance program. To request refills after this date, program must receive a refill request form from office up to 30 days prior to refill being due.   Please monitor your supply of Ozempic  at home and when you have only a 1 month supply remaining, contact Shasta Spear at [phone] to request she start refill process for you.   Thank you!   Sharyle Sia, PharmD, [facility] Clinical Pharmacist [facility] [phone]

## 2024-02-17 ENCOUNTER — Ambulatory Visit: Payer: Medicare HMO | Admitting: Dermatology

## 2024-02-17 DIAGNOSIS — L821 Other seborrheic keratosis: Secondary | ICD-10-CM | POA: Diagnosis not present

## 2024-02-17 DIAGNOSIS — D229 Melanocytic nevi, unspecified: Secondary | ICD-10-CM | POA: Diagnosis not present

## 2024-02-17 DIAGNOSIS — L820 Inflamed seborrheic keratosis: Secondary | ICD-10-CM

## 2024-02-17 DIAGNOSIS — L578 Other skin changes due to chronic exposure to nonionizing radiation: Secondary | ICD-10-CM | POA: Diagnosis not present

## 2024-02-17 DIAGNOSIS — Z1283 Encounter for screening for malignant neoplasm of skin: Secondary | ICD-10-CM | POA: Diagnosis not present

## 2024-02-17 DIAGNOSIS — D2361 Other benign neoplasm of skin of right upper limb, including shoulder: Secondary | ICD-10-CM

## 2024-02-17 DIAGNOSIS — D692 Other nonthrombocytopenic purpura: Secondary | ICD-10-CM | POA: Diagnosis not present

## 2024-02-17 DIAGNOSIS — L720 Epidermal cyst: Secondary | ICD-10-CM

## 2024-02-17 DIAGNOSIS — L304 Erythema intertrigo: Secondary | ICD-10-CM | POA: Diagnosis not present

## 2024-02-17 DIAGNOSIS — W908XXA Exposure to other nonionizing radiation, initial encounter: Secondary | ICD-10-CM | POA: Diagnosis not present

## 2024-02-17 DIAGNOSIS — Z85828 Personal history of other malignant neoplasm of skin: Secondary | ICD-10-CM

## 2024-02-17 DIAGNOSIS — D1801 Hemangioma of skin and subcutaneous tissue: Secondary | ICD-10-CM

## 2024-02-17 MED ORDER — KETOCONAZOLE 2 % EX CREA: TOPICAL_CREAM | CUTANEOUS | 3 refills | Status: AC

## 2024-02-17 NOTE — Progress Notes (Signed)
 Follow-Up Visit   Subjective  Christine Roberts is a 78 y.o. female who presents for the following: Skin Cancer Screening and Full Body Skin Exam. Patient with hx of SCC, Aks. Places at back, neck, inframammary that are irritating. Patient states she bruises real easily takes 81mg  aspirin  and low dose BP medication.   The patient presents for Total-Body Skin Exam (TBSE) for skin cancer screening and mole check. The patient has spots, moles and lesions to be evaluated, some may be new or changing and the patient may have concern these could be cancer.   The following portions of the chart were reviewed this encounter and updated as appropriate: medications, allergies, medical history  Review of Systems:  No other skin or systemic complaints except as noted in HPI or Assessment and Plan.  Objective  Well appearing patient in no apparent distress; mood and affect are within normal limits.  A full examination was performed including scalp, head, eyes, ears, nose, lips, neck, chest, axillae, abdomen, back, buttocks, bilateral upper extremities, bilateral lower extremities, hands, feet, fingers, toes, fingernails, and toenails. All findings within normal limits unless otherwise noted below.   Relevant physical exam findings are noted in the Assessment and Plan.  L flank at braline x2, R mid back at braline x1, R neck x1, L zygoma x1 (5) Stuck on waxy paps with erythema  Assessment & Plan   SKIN CANCER SCREENING PERFORMED TODAY.  ACTINIC DAMAGE - Chronic condition, secondary to cumulative UV/sun exposure - diffuse scaly erythematous macules with underlying dyspigmentation - Recommend daily broad spectrum sunscreen SPF 30+ to sun-exposed areas, reapply every 2 hours as needed.  - Staying in the shade or wearing long sleeves, sun glasses (UVA+UVB protection) and wide brim hats (4-inch brim around the entire circumference of the hat) are also recommended for sun protection.  - Call for new or  changing lesions.  LENTIGINES, SEBORRHEIC KERATOSES, HEMANGIOMAS - Benign normal skin lesions - Benign-appearing - Call for any changes  SEBORRHEIC KERATOSIS - 1.5 cm waxy tan patch at R lateral calf - Benign-appearing - Discussed benign etiology and prognosis. - Observe - Call for any changes  MELANOCYTIC NEVI - Tan-Melkonian and/or pink-flesh-colored symmetric macules and papules - 3.0 mm medium dark macule right spinal mid lower back  - 0.5 cm regular Montelongo papules left upper arm x 2 (inferior is two-toned) present for yrs no changes per pt. - Benign appearing on exam today - Observation - Call clinic for new or changing moles - Recommend daily use of broad spectrum spf 30+ sunscreen to sun-exposed areas.   HEMANGIOMAS - 7 x 5 mm purple papule at the right anterior thigh   - 3 mm purple papule right pretibial   Benign appearing on exam today - Observation - Call clinic for new or changing moles - Recommend daily use of broad spectrum spf 30+ sunscreen to sun-exposed areas.   Blue Nevus Exam: 4.0 mm dark blue gray flat papule at right hand dorsum   Benign-appearing.  Observation.  Call clinic for new or changing moles.  Recommend daily use of broad spectrum spf 30+ sunscreen to sun-exposed areas.        HISTORY OF SQUAMOUS CELL CARCINOMA OF THE SKIN Right distal medial calf- ED[facility] 11/12/2021 - No evidence of recurrence today - Recommend regular full body skin exams - Recommend daily broad spectrum sunscreen SPF 30+ to sun-exposed areas, reapply every 2 hours as needed.  - Call if any new or changing lesions are noted  between office visits  Purpura - Chronic; persistent and recurrent.  Treatable, but not curable. - Violaceous macules and patches at arms.  - Benign - Related to trauma, age, sun damage and/or use of blood thinners, chronic use of topical and/or oral steroids - Observe - Can use OTC arnica containing moisturizer such as Dermend Bruise Formula if desired -  Call for worsening or other concerns  INTERTRIGO Exam: Erythematous macerated patches in body folds at inframammary.   Chronic and persistent condition with duration or expected duration over one year. Condition is bothersome/symptomatic for patient. Currently flared.   Intertrigo is a chronic recurrent rash that occurs in skin fold areas that may be associated with friction; heat; moisture; yeast; fungus; and bacteria.  It is exacerbated by increased movement / activity; sweating; and higher atmospheric temperature.  Use of an absorbant powder such as Zeasorb AF powder or other OTC antifungal powder to the area daily can prevent rash recurrence. Other options to help keep the area dry include blow drying the area after bathing or using antiperspirant products such as Duradry sweat minimizing gel.  Treatment Plan: Start ketoconazole  cream 2% apply BID to aa, inframammary for 2 weeks then can go down to using every night and apply powder in AM.   Recommend OTC Zeasorb AF powder to body folds daily after shower.  It is often found in the athlete's foot section in the pharmacy.  Avoid using powders that contain cornstarch.  Milia - tiny firm white papules at chin - type of cyst - benign - sometimes these will clear with nightly OTC adapalene/Differin 0.1% gel or retinol. Patient has Effaclar gel samples at home given from last visit.  - may be extracted if symptomatic, but discussed not always covered by insurance.  - observe  Discussed cosmetic procedure milia extraction, noncovered.  $60 for 1st lesion and $15 for each additional lesion if done on the same day.  Maximum charge $350.  One touch-up treatment included no charge. Discussed risks of treatment including dyspigmentation, small scar, and/or recurrence. Recommend daily broad spectrum sunscreen SPF 30+/photoprotection to treated areas once healed.  INFLAMED SEBORRHEIC KERATOSIS (5) L flank at braline x2, R mid back at braline x1, R  neck x1, L zygoma x1 (5) Symptomatic, irritating, patient would like treated. Destruction of lesion - L flank at braline x2, R mid back at braline x1, R neck x1, L zygoma x1 (5)  Destruction method: cryotherapy   Informed consent: discussed and consent obtained   Lesion destroyed using liquid nitrogen: Yes   Region frozen until ice ball extended beyond lesion: Yes   Outcome: patient tolerated procedure well with no complications   Post-procedure details: wound care instructions given   Additional details:  Prior to procedure, discussed risks of blister formation, small wound, skin dyspigmentation, or rare scar following cryotherapy. Recommend Vaseline ointment to treated areas while healing.    Return in about 1 year (around 02/16/2025) for TBSE, HxSCC, w/ Dr. Jackquline.  I, Jacquelynn V. Wilfred, CMA, am acting as scribe for Rexene Jackquline, MD .   Documentation: I have reviewed the above documentation for accuracy and completeness, and I agree with the above.  Rexene Jackquline, MD

## 2024-02-17 NOTE — Patient Instructions (Addendum)
 Cryotherapy Aftercare  Wash gently with soap and water everyday.   Apply Vaseline and Band-Aid daily until healed.   For bruising: OTC Dermend Bruise Formula if desired.  For milia (at chin): OTC Effaclar gel or OTC adapalene nightly at chin as tolerated. (Will be in the acne section at the drugstore.)    For intertrigo: Start ketoconazole  cream 2% apply twice a day to aa, inframammary for 2 weeks then can go down to using every week at night.    Recommend OTC Zeasorb AF powder to body folds daily after shower.  It is often found in the athlete's foot section in the pharmacy.  Avoid using powders that contain cornstarch.   Recommend daily broad spectrum sunscreen SPF 30+ to sun-exposed areas, reapply every 2 hours as needed. Call for new or changing lesions.  Staying in the shade or wearing long sleeves, sun glasses (UVA+UVB protection) and wide brim hats (4-inch brim around the entire circumference of the hat) are also recommended for sun protection.    Melanoma ABCDEs  Melanoma is the most dangerous type of skin cancer, and is the leading cause of death from skin disease.  You are more likely to develop melanoma if you: Have light-colored skin, light-colored eyes, or red or blond hair Spend a lot of time in the sun Tan regularly, either outdoors or in a tanning bed Have had blistering sunburns, especially during childhood Have a close family member who has had a melanoma Have atypical moles or large birthmarks  Early detection of melanoma is key since treatment is typically straightforward and cure rates are extremely high if we catch it early.   The first sign of melanoma is often a change in a mole or a new dark spot.  The ABCDE system is a way of remembering the signs of melanoma.  A for asymmetry:  The two halves do not match. B for border:  The edges of the growth are irregular. C for color:  A mixture of colors are present instead of an even [place] color. D for  diameter:  Melanomas are usually (but not always) greater than 6mm - the size of a pencil eraser. E for evolution:  The spot keeps changing in size, shape, and color.  Please check your skin once per month between visits. You can use a small mirror in front and a large mirror behind you to keep an eye on the back side or your body.   If you see any new or changing lesions before your next follow-up, please call to schedule a visit.  Please continue daily skin protection including broad spectrum sunscreen SPF 30+ to sun-exposed areas, reapplying every 2 hours as needed when you're outdoors.   Staying in the shade or wearing long sleeves, sun glasses (UVA+UVB protection) and wide brim hats (4-inch brim around the entire circumference of the hat) are also recommended for sun protection.    Due to recent changes in healthcare laws, you may see results of your pathology and/or laboratory studies on MyChart before the doctors have had a chance to review them. We understand that in some cases there may be results that are confusing or concerning to you. Please understand that not all results are received at the same time and often the doctors may need to interpret multiple results in order to provide you with the best plan of care or course of treatment. Therefore, we ask that you please give us  2 business days to thoroughly review all your results  before contacting the office for clarification. Should we see a critical lab result, you will be contacted sooner.   If You Need Anything After Your Visit  If you have any questions or concerns for your doctor, please call our main line at [phone] and press option 4 to reach your doctor's medical assistant. If no one answers, please leave a voicemail as directed and we will return your call as soon as possible. Messages left after 4 pm will be answered the following business day.   You may also send us  a message via MyChart. We typically respond to  MyChart messages within 1-2 business days.  For prescription refills, please ask your pharmacy to contact our office. Our fax number is [phone].  If you have an urgent issue when the clinic is closed that cannot wait until the next business day, you can page your doctor at the number below.    Please note that while we do our best to be available for urgent issues outside of office hours, we are not available 24/7.   If you have an urgent issue and are unable to reach us , you may choose to seek medical care at your doctor's office, retail clinic, urgent care center, or emergency room.  If you have a medical emergency, please immediately call 911 or go to the emergency department.  Pager Numbers  - Dr. Hester: [phone]  - Dr. Jackquline: [phone]  - Dr. Claudene: [phone]   In the event of inclement weather, please call our main line at [phone] for an update on the status of any delays or closures.  Dermatology Medication Tips: Please keep the boxes that topical medications come in in order to help keep track of the instructions about where and how to use these. Pharmacies typically print the medication instructions only on the boxes and not directly on the medication tubes.   If your medication is too expensive, please contact our office at [phone] option 4 or send us  a message through MyChart.   We are unable to tell what your co-pay for medications will be in advance as this is different depending on your insurance coverage. However, we may be able to find a substitute medication at lower cost or fill out paperwork to get insurance to cover a needed medication.   If a prior authorization is required to get your medication covered by your insurance company, please allow us  1-2 business days to complete this process.  Drug prices often vary depending on where the prescription is filled and some pharmacies may offer cheaper prices.  The website www.goodrx.com  contains coupons for medications through different pharmacies. The prices here do not account for what the cost may be with help from insurance (it may be cheaper with your insurance), but the website can give you the price if you did not use any insurance.  - You can print the associated coupon and take it with your prescription to the pharmacy.  - You may also stop by our office during regular business hours and pick up a GoodRx coupon card.  - If you need your prescription sent electronically to a different pharmacy, notify our office through [facility] or by phone at [phone] option 4.     Si Usted Necesita Algo Despus de Su Visita  Tambin puede enviarnos un mensaje a travs de Clinical cytogeneticist. Por lo general respondemos a los mensajes de MyChart en el transcurso de 1 a 2 das hbiles.  Para renovar recetas, por favor  pida a su farmacia que se ponga en contacto con nuestra oficina. Randi lakes de fax es [place] [phone].  Si tiene un asunto urgente cuando la clnica est cerrada y que no puede esperar hasta el siguiente da hbil, puede llamar/localizar a su doctor(a) al nmero que aparece a continuacin.   Por favor, tenga en cuenta que aunque hacemos todo lo posible para estar disponibles para asuntos urgentes fuera del horario de [place], no estamos disponibles las 24 horas del da, los 7 [address]  [address] de la [place].   Si tiene un problema urgente y no puede comunicarse con nosotros, puede optar por buscar atencin mdica  en el consultorio de su doctor(a), en una clnica privada, en un centro de atencin urgente o en una sala de emergencias.  Si tiene Engineer, drilling, por favor llame inmediatamente al 911 o vaya a la sala de emergencias.  Nmeros de bper  - Dr. Hester: [phone]  - Dra. Jackquline: [phone]  - Dr. Claudene: [phone]   En caso de inclemencias del tiempo, por favor llame a landry capes principal al [phone] para una actualizacin sobre el [place]  de cualquier retraso o cierre.  Consejos para la medicacin en dermatologa: Por favor, guarde las cajas en las que vienen los medicamentos de uso tpico para ayudarle a seguir las instrucciones sobre dnde y cmo usarlos. Las farmacias generalmente imprimen las instrucciones del medicamento slo en las cajas y no directamente en los tubos del [place].   Si su medicamento es muy caro, por favor, pngase en contacto con landry rieger llamando al [phone] y presione la opcin 4 o envenos un mensaje a travs de Clinical cytogeneticist.   No podemos decirle cul ser su copago por los medicamentos por adelantado ya que esto es diferente dependiendo de la cobertura de su seguro. Sin embargo, es posible que podamos encontrar un medicamento sustituto a Audiological scientist un formulario para que el seguro cubra el medicamento que se considera necesario.   Si se requiere una autorizacin previa para que su compaa de seguros malta su medicamento, por favor permtanos de 1 a 2 das hbiles para completar este proceso.  Los precios de los medicamentos varan con frecuencia dependiendo del Environmental consultant de dnde se surte la receta y alguna farmacias pueden ofrecer precios ms baratos.  El sitio web www.goodrx.com tiene cupones para medicamentos de Health and safety inspector. Los precios aqu no tienen en cuenta lo que podra costar con la ayuda del seguro (puede ser ms barato con su seguro), pero el sitio web puede darle el precio si no utiliz Tourist information centre manager.  - Puede imprimir el cupn correspondiente y llevarlo con su receta a la farmacia.  - Tambin puede pasar por nuestra oficina durante el horario de atencin regular y Education officer, museum una tarjeta de cupones de GoodRx.  - Si necesita que su receta se enve electrnicamente a una farmacia diferente, informe a nuestra oficina a travs de MyChart de [place] o por telfono llamando al [phone] y presione la opcin 4.

## 2024-02-19 DIAGNOSIS — N182 Chronic kidney disease, stage 2 (mild): Secondary | ICD-10-CM | POA: Diagnosis not present

## 2024-02-19 DIAGNOSIS — E663 Overweight: Secondary | ICD-10-CM | POA: Diagnosis not present

## 2024-02-19 DIAGNOSIS — E1122 Type 2 diabetes mellitus with diabetic chronic kidney disease: Secondary | ICD-10-CM | POA: Diagnosis not present

## 2024-02-19 DIAGNOSIS — E1169 Type 2 diabetes mellitus with other specified complication: Secondary | ICD-10-CM | POA: Diagnosis not present

## 2024-02-19 DIAGNOSIS — Z008 Encounter for other general examination: Secondary | ICD-10-CM | POA: Diagnosis not present

## 2024-02-19 DIAGNOSIS — E1136 Type 2 diabetes mellitus with diabetic cataract: Secondary | ICD-10-CM | POA: Diagnosis not present

## 2024-02-19 DIAGNOSIS — Z6826 Body mass index (BMI) 26.0-26.9, adult: Secondary | ICD-10-CM | POA: Diagnosis not present

## 2024-02-19 DIAGNOSIS — I129 Hypertensive chronic kidney disease with stage 1 through stage 4 chronic kidney disease, or unspecified chronic kidney disease: Secondary | ICD-10-CM | POA: Diagnosis not present

## 2024-02-19 DIAGNOSIS — E785 Hyperlipidemia, unspecified: Secondary | ICD-10-CM | POA: Diagnosis not present

## 2024-02-19 DIAGNOSIS — Z794 Long term (current) use of insulin: Secondary | ICD-10-CM | POA: Diagnosis not present

## 2024-02-19 DIAGNOSIS — E1142 Type 2 diabetes mellitus with diabetic polyneuropathy: Secondary | ICD-10-CM | POA: Diagnosis not present

## 2024-02-27 ENCOUNTER — Ambulatory Visit: Admitting: Family Medicine

## 2024-03-01 ENCOUNTER — Ambulatory Visit (INDEPENDENT_AMBULATORY_CARE_PROVIDER_SITE_OTHER): Admitting: Family Medicine

## 2024-03-01 ENCOUNTER — Encounter: Payer: Self-pay | Admitting: Family Medicine

## 2024-03-01 VITALS — BP 118/76 | HR 91 | Resp 16 | Ht 63.5 in | Wt 160.7 lb

## 2024-03-01 DIAGNOSIS — E1169 Type 2 diabetes mellitus with other specified complication: Secondary | ICD-10-CM | POA: Diagnosis not present

## 2024-03-01 DIAGNOSIS — M7062 Trochanteric bursitis, left hip: Secondary | ICD-10-CM | POA: Diagnosis not present

## 2024-03-01 DIAGNOSIS — Z8673 Personal history of transient ischemic attack (TIA), and cerebral infarction without residual deficits: Secondary | ICD-10-CM | POA: Diagnosis not present

## 2024-03-01 DIAGNOSIS — E785 Hyperlipidemia, unspecified: Secondary | ICD-10-CM

## 2024-03-01 DIAGNOSIS — N1831 Chronic kidney disease, stage 3a: Secondary | ICD-10-CM

## 2024-03-01 DIAGNOSIS — E538 Deficiency of other specified B group vitamins: Secondary | ICD-10-CM

## 2024-03-01 DIAGNOSIS — E1121 Type 2 diabetes mellitus with diabetic nephropathy: Secondary | ICD-10-CM

## 2024-03-01 DIAGNOSIS — E559 Vitamin D deficiency, unspecified: Secondary | ICD-10-CM | POA: Diagnosis not present

## 2024-03-01 DIAGNOSIS — I152 Hypertension secondary to endocrine disorders: Secondary | ICD-10-CM | POA: Diagnosis not present

## 2024-03-01 DIAGNOSIS — E1159 Type 2 diabetes mellitus with other circulatory complications: Secondary | ICD-10-CM | POA: Diagnosis not present

## 2024-03-01 DIAGNOSIS — I700 Atherosclerosis of aorta: Secondary | ICD-10-CM

## 2024-03-01 LAB — POCT GLYCOSYLATED HEMOGLOBIN (HGB A1C): Hemoglobin A1C: 6.5 % — AB (ref 4.0–5.6)

## 2024-03-01 MED ORDER — CELECOXIB 100 MG PO CAPS: 100.0000 mg | ORAL_CAPSULE | Freq: Two times a day (BID) | ORAL | 0 refills | Status: DC | PRN

## 2024-03-01 NOTE — Progress Notes (Signed)
 Name: JAVIONNA LEDER   MRN: [id number]    DOB: [birth year]   Date:03/01/2024       Progress Note  Subjective  Chief Complaint  Chief Complaint  Patient presents with   Medical Management of Chronic Issues    Req DM Exam from [facility]   Discussed the use of AI scribe software for clinical note transcription with the patient, who gave verbal consent to proceed.  History of Present Illness DIARRA CEJA is a 78 year old female with diabetes and hypertension who presents for a regular follow-up visit.  Her A1c has decreased from 6.9% to 6.5%. She maintains a stable weight of approximately 160.7 pounds. Her current diabetes medications include metformin  750 mg once daily, Ozempic  2 mg, and Tresiba  14 units. She regularly monitors her blood sugar, which is typically around 100-110 mg/dL upon waking.  She reports new onset left hip pain, which originates from her back and radiates down her leg, pain is worse when she sleeps on her left side . The pain is associated with stiffness and worsens with prolonged standing or walking. She uses a walker for mobility. The pain improves with movement and has been present for about two months. She has not taken any medication for this issue yet.  She has diabetes-associated nephropathy, hypertension, and dyslipidemia . Her blood pressure is well-controlled on lisinopril  2.5 mg.  She also has a history of B12 and folate deficiency, for which she takes B12 three times a week  She recently switched her health insurance to [facility] and changed her preferred pharmacy to [facility], where she receives some medications for free. She has undergone a recent eye exam and was informed about cataracts that do not require immediate intervention.    Patient Active Problem List   Diagnosis Date Noted   Atherosclerosis of aorta (HCC) 05/25/2018   Seasonal allergic rhinitis 09/12/2015   TIA (transient ischemic attack) 05/24/2015   Senile purpura (HCC)  05/09/2015   Benign essential HTN 03/18/2015   Pain in shoulder 03/18/2015   Dyslipidemia 03/18/2015   History of surgery to major organs, presenting hazards to health 03/18/2015   Hearing loss 03/18/2015   Hiatal hernia 03/18/2015   History of colon polyps 03/18/2015   Calculus of kidney 03/18/2015   B12 deficiency 03/18/2015   Microalbuminuria 03/18/2015   Adult BMI 30+ 03/18/2015   Arthritis, degenerative 03/18/2015   Diabetes mellitus with nephropathy (HCC) 03/18/2015    Past Surgical History:  Procedure Laterality Date   ABDOMINAL HYSTERECTOMY     CHOLECYSTECTOMY     COLONOSCOPY  2015   COLONOSCOPY WITH PROPOFOL  N/A 03/04/2018   Procedure: COLONOSCOPY WITH PROPOFOL ;  Surgeon: Toledo, Ladell POUR, MD;  Location: [facility] ENDOSCOPY;  Service: Gastroenterology;  Laterality: N/A;   COLONOSCOPY WITH PROPOFOL  N/A 09/03/2023   Procedure: COLONOSCOPY WITH PROPOFOL ;  Surgeon: Toledo, Ladell POUR, MD;  Location: [facility] ENDOSCOPY;  Service: Gastroenterology;  Laterality: N/A;   cystoscopic     stent placed in ureter   LITHOTRIPSY  12/2007   MYRINGOTOMY Right 11/2009   POLYPECTOMY  09/03/2023   Procedure: POLYPECTOMY;  Surgeon: Toledo, Ladell POUR, MD;  Location: [facility] ENDOSCOPY;  Service: Gastroenterology;;    Family History  Problem Relation Age of Onset   Cancer Mother    Diabetes Mother    Diabetes Daughter    CAD Father    Diabetes Daughter        hypoglycemia    Social History   Tobacco Use  Smoking status: Former    Current packs/day: 0.50    Average packs/day: 0.5 packs/day for 10.0 years (5.0 ttl pk-yrs)    Types: Cigarettes    Passive exposure: Past   Smokeless tobacco: Never   Tobacco comments:    smoking cessation materials not required.  Quit 30 years ago.  Substance Use Topics   Alcohol use: Yes    Alcohol/week: 1.0 standard drink of alcohol    Types: 1 Shots of liquor per week    Comment: socially     Current Outpatient Medications:    ACCU-CHEK SOFTCLIX  LANCETS lancets, 1 each by Other route daily., Disp: 100 each, Rfl: 1   acetaminophen  (TYLENOL ) 650 MG CR tablet, Take 650 mg by mouth every 8 (eight) hours as needed., Disp: , Rfl:    albuterol  (VENTOLIN  HFA) 108 (90 Base) MCG/ACT inhaler, Inhale 2 puffs into the lungs every 6 (six) hours as needed for wheezing or shortness of breath., Disp: 8 g, Rfl: 0   Alcohol Swabs (B-D SINGLE USE SWABS REGULAR) PADS, 1 each by Does not apply route daily., Disp: 400 each, Rfl: 1   aspirin  EC 81 MG tablet, Take 1 tablet (81 mg total) by mouth daily., Disp: 90 tablet, Rfl: 0   atorvastatin  (LIPITOR) 40 MG tablet, Take 1 tablet (40 mg total) by mouth 4 (four) times a week., Disp: 48 tablet, Rfl: 1   Biotin 1000 MCG CHEW, Chew 1,000 mg by mouth daily., Disp: , Rfl:    Blood Glucose Monitoring Suppl (TRUE METRIX AIR GLUCOSE METER) w/Device KIT, 1 each by Does not apply route once for 1 dose., Disp: 1 kit, Rfl: 0   calcium  citrate (CALCITRATE - DOSED IN MG ELEMENTAL CALCIUM ) 950 MG tablet, Take 200 mg of elemental calcium  by mouth daily. Has D3 in it as well, Disp: , Rfl:    Cholecalciferol (VITAMIN D ) 125 MCG (5000 UT) CAPS, Take by mouth. Pt taking 3 times per week, Disp: , Rfl:    Coenzyme Q10 (COQ10) 100 MG CAPS, Take 1 capsule by mouth daily., Disp: , Rfl:    fluticasone  (FLONASE ) 50 MCG/ACT nasal spray, Place 2 sprays into both nostrils daily., Disp: 16 g, Rfl: 6   glucose blood (TRUE METRIX BLOOD GLUCOSE TEST) test strip, TEST BLOOD SUGAR IN THE MORNING, AT NOON, AND AT BEDTIME, Disp: 200 strip, Rfl: 0   insulin  degludec (TRESIBA  FLEXTOUCH) 200 UNIT/ML FlexTouch Pen, Inject 28 Units into the skin daily. Patient receives through [employer] Patient Assistance through Dec 2022 (Patient taking differently: Inject 12 Units into the skin daily. Patient receives through [employer] Patient Assistance through Dec 2022), Disp: , Rfl:    Insulin  Pen Needle (NOVOFINE) 30G X 8 MM MISC, Inject 10 each into the skin as  needed., Disp: 100 each, Rfl: 2   ketoconazole  (NIZORAL ) 2 % cream, Apply BID to aa, inframammary for 2 weeks then can go down to using every week at night for intertrigo., Disp: 30 g, Rfl: 3   lisinopril  (ZESTRIL ) 2.5 MG tablet, Take 1 tablet (2.5 mg total) by mouth daily., Disp: 90 tablet, Rfl: 1   metFORMIN  (GLUCOPHAGE -XR) 750 MG 24 hr tablet, Take 1 tablet (750 mg total) by mouth daily with breakfast., Disp: 90 tablet, Rfl: 1   metroNIDAZOLE  (METROCREAM ) 0.75 % cream, Apply qd/bid at mid face and nose for rosacea, Disp: 45 g, Rfl: 5   mupirocin  ointment (BACTROBAN ) 2 %, Apply 1 Application topically 2 (two) times daily., Disp: 22 g, Rfl: 0  Semaglutide , 2 MG/DOSE, 8 MG/3ML SOPN, Inject 2 mg as directed once a week., Disp: 9 mL, Rfl: 0   VITAMIN B COMPLEX-C PO, Take by mouth., Disp: , Rfl:   Allergies  Allergen Reactions   Latex Rash   Erythromycin Other (See Comments)    Strawberry tongue    I personally reviewed active problem list, medication list, allergies with the patient/caregiver today.   ROS  Ten systems reviewed and is negative except as mentioned in HPI    Objective Physical Exam  CONSTITUTIONAL: Patient appears well-developed and well-nourished. No distress. HEENT: Head atraumatic, normocephalic, neck supple. CARDIOVASCULAR: Normal rate, regular rhythm and normal heart sounds. No murmur heard. No BLE edema. PULMONARY: Effort normal and breath sounds normal. No respiratory distress. MUSCULOSKELETAL: Normal gait. Tenderness during palpation of left trochanteric bursa, negative straight leg raise.  PSYCHIATRIC: Patient has a normal mood and affect. Behavior is normal. Judgment and thought content normal. NEUROLOGICAL: Sensation intact in feet.  Vitals:   03/01/24 1339  BP: 118/76  Pulse: 91  Resp: 16  SpO2: 99%  Weight: 160 lb 11.2 oz (72.9 kg)  Height: 5' 3.5 (1.613 m)    Body mass index is 28.02 kg/m.  Recent Results (from the past 2160 hours)  POCT  glycosylated hemoglobin (Hb A1C)     Status: Abnormal   Collection Time: 03/01/24  1:45 PM  Result Value Ref Range   Hemoglobin A1C 6.5 (A) 4.0 - 5.6 %   HbA1c POC (<> result, manual entry)     HbA1c, POC (prediabetic range)     HbA1c, POC (controlled diabetic range)      Diabetic Foot Exam:     PHQ2/9:    03/01/2024    1:27 PM 09/25/2023    3:20 PM 06/23/2023    1:45 PM 02/21/2023    1:35 PM 01/06/2023    3:21 PM  Depression screen PHQ 2/9  Decreased Interest 0 0 0 0 0  Down, Depressed, Hopeless 0 0 0 0 0  PHQ - 2 Score 0 0 0 0 0  Altered sleeping  0  0 0  Tired, decreased energy  0  0 0  Change in appetite  0  0 0  Feeling bad or failure about yourself   0  0 0  Trouble concentrating  0  0 0  Moving slowly or fidgety/restless  0  0 0  Suicidal thoughts  0  0 0  PHQ-9 Score  0  0 0  Difficult doing work/chores  Not difficult at all   Not difficult at all    phq 9 is negative  Fall Risk:    03/01/2024    1:27 PM 09/25/2023    3:22 PM 06/23/2023    1:45 PM 02/21/2023    1:35 PM 01/06/2023    3:20 PM  Fall Risk   Falls in the past year? 0 0 0 0 0  Number falls in past yr: 0 0 0 0 0  Injury with Fall? 0 0 0 0 0  Risk for fall due to : No Fall Risks No Fall Risks No Fall Risks No Fall Risks No Fall Risks  Follow up  Falls prevention discussed;Falls evaluation completed Falls prevention discussed;Education provided;Falls evaluation completed Falls prevention discussed Falls evaluation completed     Assessment & Plan Type 2 diabetes mellitus with nephropathy, lower extremity edema, and neuropathy A1c improved to 6.5%. Blood sugar stable at 100-110 mg/dL. No hypoglycemia. Nephropathy with proteinuria. Edema and neuropathy present. Blood  pressure well controlled. - Continue metformin , Ozempic , and Tresiba . - Monitor blood sugar levels; adjust Tresiba  if consistently below 100 mg/dL. - Check urine for protein. - Schedule foot exam. - Monitor blood pressure at home; consider  stopping lisinopril  if below 120/80 mmHg before next visit. - Order lipid panel, comprehensive metabolic panel, and CBC. - Check vitamin B12 and vitamin D  levels.  Trochanteric bursitis, left hip Left hip pain likely due to trochanteric bursitis. Pain relieved by movement, suggesting bursitis over sciatica. - Prescribe Celebrex  for four weeks, 90 tablets, twice daily. - Advise stretching exercises and use of ice packs. - Recommend massage and use of foam roller or handheld roller. - Consider corticosteroid injection if pain persists.  Hypertension, currently well controlled Blood pressure at 118/76 mmHg on minimal lisinopril  dose. Important for kidney protection. - Monitor blood pressure at home. - Consider stopping lisinopril  if below 120/80 mmHg before next visit.  Chronic kidney disease, stage 3A Kidney function stable. Previous proteinuria noted. Blood pressure control crucial. - Monitor kidney function with comprehensive metabolic panel. - Continue blood pressure management.  Cataracts, not currently requiring intervention Cataracts present but not requiring surgery. She prefers to delay intervention. - Monitor vision changes and reassess need for cataract surgery as needed.

## 2024-03-03 DIAGNOSIS — E538 Deficiency of other specified B group vitamins: Secondary | ICD-10-CM | POA: Diagnosis not present

## 2024-03-03 DIAGNOSIS — E1121 Type 2 diabetes mellitus with diabetic nephropathy: Secondary | ICD-10-CM | POA: Diagnosis not present

## 2024-03-03 DIAGNOSIS — E1169 Type 2 diabetes mellitus with other specified complication: Secondary | ICD-10-CM | POA: Diagnosis not present

## 2024-03-03 DIAGNOSIS — E785 Hyperlipidemia, unspecified: Secondary | ICD-10-CM | POA: Diagnosis not present

## 2024-03-03 DIAGNOSIS — E559 Vitamin D deficiency, unspecified: Secondary | ICD-10-CM | POA: Diagnosis not present

## 2024-03-03 DIAGNOSIS — I152 Hypertension secondary to endocrine disorders: Secondary | ICD-10-CM | POA: Diagnosis not present

## 2024-03-03 DIAGNOSIS — E1159 Type 2 diabetes mellitus with other circulatory complications: Secondary | ICD-10-CM | POA: Diagnosis not present

## 2024-03-04 LAB — CBC WITH DIFFERENTIAL/PLATELET
Absolute Lymphocytes: 1761 {cells}/uL (ref 850–3900)
Absolute Monocytes: 428 {cells}/uL (ref 200–950)
Basophils Absolute: 41 {cells}/uL (ref 0–200)
Basophils Relative: 0.6 %
Eosinophils Absolute: 252 {cells}/uL (ref 15–500)
Eosinophils Relative: 3.7 %
HCT: 35.7 % (ref 35.0–45.0)
Hemoglobin: 11.9 g/dL (ref 11.7–15.5)
MCH: 29.5 pg (ref 27.0–33.0)
MCHC: 33.3 g/dL (ref 32.0–36.0)
MCV: 88.6 fL (ref 80.0–100.0)
MPV: 9.9 fL (ref 7.5–12.5)
Monocytes Relative: 6.3 %
Neutro Abs: 4318 {cells}/uL (ref 1500–7800)
Neutrophils Relative %: 63.5 %
Platelets: 260 Thousand/uL (ref 140–400)
RBC: 4.03 Million/uL (ref 3.80–5.10)
RDW: 12.9 % (ref 11.0–15.0)
Total Lymphocyte: 25.9 %
WBC: 6.8 Thousand/uL (ref 3.8–10.8)

## 2024-03-04 LAB — LIPID PANEL
Cholesterol: 153 mg/dL (ref <200)
HDL: 63 mg/dL (ref >=50)
LDL Cholesterol (Calc): 75 mg/dL
Non-HDL Cholesterol (Calc): 90 mg/dL (ref <130)
Total CHOL/HDL Ratio: 2.4 (calc) (ref <5.0)
Triglycerides: 72 mg/dL (ref <150)

## 2024-03-04 LAB — COMPREHENSIVE METABOLIC PANEL WITH GFR
AG Ratio: 1.6 (calc) (ref 1.0–2.5)
ALT: 13 U/L (ref 6–29)
AST: 14 U/L (ref 10–35)
Albumin: 3.9 g/dL (ref 3.6–5.1)
Alkaline phosphatase (APISO): 60 U/L (ref 37–153)
BUN/Creatinine Ratio: 17 (calc) (ref 6–22)
BUN: 10 mg/dL (ref 7–25)
CO2: 28 mmol/L (ref 20–32)
Calcium: 9.5 mg/dL (ref 8.6–10.4)
Chloride: 105 mmol/L (ref 98–110)
Creat: 0.58 mg/dL — ABNORMAL LOW (ref 0.60–1.00)
Globulin: 2.4 g/dL (ref 1.9–3.7)
Glucose, Bld: 88 mg/dL (ref 65–99)
Potassium: 4.1 mmol/L (ref 3.5–5.3)
Sodium: 141 mmol/L (ref 135–146)
Total Bilirubin: 0.7 mg/dL (ref 0.2–1.2)
Total Protein: 6.3 g/dL (ref 6.1–8.1)
eGFR: 92 mL/min/1.73m2 (ref >=60)

## 2024-03-04 LAB — EXTRA

## 2024-03-04 LAB — VITAMIN D 25 HYDROXY (VIT D DEFICIENCY, FRACTURES): Vit D, 25-Hydroxy: 54 ng/mL (ref 30–100)

## 2024-03-04 LAB — MICROALBUMIN / CREATININE URINE RATIO
Creatinine, Urine: 111 mg/dL (ref 20–275)
Microalb Creat Ratio: 5 mg/g{creat} (ref <30)
Microalb, Ur: 0.6 mg/dL

## 2024-03-04 LAB — B12 AND FOLATE PANEL
Folate: 12.4 ng/mL
Vitamin B-12: >2000 pg/mL — ABNORMAL HIGH (ref 200–1100)

## 2024-03-06 ENCOUNTER — Ambulatory Visit: Payer: Self-pay | Admitting: Family Medicine

## 2024-03-09 ENCOUNTER — Ambulatory Visit: Payer: Self-pay

## 2024-03-09 NOTE — Telephone Encounter (Signed)
 FYI Only or Action Required?: FYI only for provider.  Patient was last seen in primary care on 03/01/2024 by Glenard Mire, MD.  Called Nurse Triage reporting Pain.  Symptoms began chronic.  Interventions attempted: OTC medications: tylenol  and ibuprofen.  Symptoms are: gradually worsening.  Triage Disposition: See Today or Tomorrow in Office (overriding See HCP Within 4 Hours (Or PCP Triage))  Patient/caregiver understands and will follow disposition?: Yes Reason for Disposition  [1] Pain radiates into the thigh or further down the leg AND [2] both legs  Answer Assessment - Initial Assessment Questions Seen recently for same, was considering cortisone injection and now would like it.  1. ONSET: When did the pain begin? (e.g., minutes, hours, days)     Chronic  2. LOCATION: Where does it hurt? (upper, mid or lower back)     Back to left leg  3. SEVERITY: How bad is the pain?     Limiting movement d/t pain  4. PATTERN: Is the pain constant? (e.g., yes, no; constant, intermittent)      Constant  5. RADIATION: Does the pain shoot into your legs or somewhere else?     To left leg  Protocols used: Back Pain-A-AH Copied from CRM #[id number]. Topic: Clinical - Red Word Triage >> Mar 09, 2024  9:16 AM Pinkey ORN wrote: Red Word that prompted transfer to Nurse Triage: Nerve Pain >> Mar 09, 2024  9:18 AM Pinkey ORN wrote: Patient states she's experiencing some nerve pain that's currently getting worse. Patient states she needs a Cortizone shot

## 2024-03-10 ENCOUNTER — Ambulatory Visit: Admitting: Family Medicine

## 2024-03-10 ENCOUNTER — Encounter: Payer: Self-pay | Admitting: Family Medicine

## 2024-03-10 VITALS — BP 108/72 | HR 99 | Resp 16 | Ht 63.5 in | Wt 160.8 lb

## 2024-03-10 DIAGNOSIS — M7062 Trochanteric bursitis, left hip: Secondary | ICD-10-CM | POA: Diagnosis not present

## 2024-03-10 MED ORDER — LIDOCAINE HCL (PF) 1 % IJ SOLN
2.0000 mL | Freq: Once | INTRAMUSCULAR | Status: AC
  Administered 2024-03-10: 2 mL

## 2024-03-10 MED ORDER — CELECOXIB 100 MG PO CAPS: 100.0000 mg | ORAL_CAPSULE | Freq: Two times a day (BID) | ORAL | 0 refills | Status: DC | PRN

## 2024-03-10 MED ORDER — TRIAMCINOLONE ACETONIDE 40 MG/ML IJ SUSP
40.0000 mg | Freq: Once | INTRAMUSCULAR | Status: AC
  Administered 2024-03-10: 40 mg via INTRAMUSCULAR

## 2024-03-10 NOTE — Progress Notes (Signed)
 Name: Christine Roberts   MRN: [id number]    DOB: 1946-04-20   Date:03/10/2024       Progress Note  Subjective  Chief Complaint  Chief Complaint  Patient presents with   Back Pain    Chronic, Back to left leg, Limiting movement d/t pain, Constant, Patient states she's experiencing some nerve pain that's currently getting worse. Patient states she needs a Cortizone shot      Discussed the use of AI scribe software for clinical note transcription with the patient, who gave verbal consent to proceed.  History of Present Illness Christine Roberts is a 78 year old female who presents with left hip pain.  She experiences pain in her left hip, specifically at the trochanteric bursa, which disrupts her sleep, especially when lying on her left side. The pain is more severe at night, feels bad when she first gets up in the morning but gradually improves and is worse again by the end of the day. Prolonged activity exacerbates the pain.  She has been using ibuprofen for pain relief but has not yet received Celebrex  due to a possible pharmacy error. She prefers her prescriptions to be sent to her local [facility] or [facility].  Walking provides slight relief, but the pain persists. She has not yet received a cortisone injection.  Her diabetes is well-controlled with an A1c of 6.5.    Patient Active Problem List   Diagnosis Date Noted   Vitamin D  deficiency 03/01/2024   Atherosclerosis of aorta (HCC) 05/25/2018   Seasonal allergic rhinitis 09/12/2015   TIA (transient ischemic attack) 05/24/2015   Senile purpura (HCC) 05/09/2015   Benign essential HTN 03/18/2015   Pain in shoulder 03/18/2015   Dyslipidemia 03/18/2015   History of surgery to major organs, presenting hazards to health 03/18/2015   Hearing loss 03/18/2015   Hiatal hernia 03/18/2015   History of colon polyps 03/18/2015   Calculus of kidney 03/18/2015   B12 deficiency 03/18/2015   Microalbuminuria 03/18/2015   Adult BMI 30+ 03/18/2015    Arthritis, degenerative 03/18/2015   Type 2 diabetes with nephropathy (HCC) 03/18/2015    Social History   Tobacco Use   Smoking status: Former    Current packs/day: 0.50    Average packs/day: 0.5 packs/day for 10.0 years (5.0 ttl pk-yrs)    Types: Cigarettes    Passive exposure: Past   Smokeless tobacco: Never   Tobacco comments:    smoking cessation materials not required.  Quit 30 years ago.  Substance Use Topics   Alcohol use: Yes    Alcohol/week: 1.0 standard drink of alcohol    Types: 1 Shots of liquor per week    Comment: socially     Current Outpatient Medications:    ACCU-CHEK SOFTCLIX LANCETS lancets, 1 each by Other route daily., Disp: 100 each, Rfl: 1   acetaminophen  (TYLENOL ) 650 MG CR tablet, Take 650 mg by mouth every 8 (eight) hours as needed., Disp: , Rfl:    albuterol  (VENTOLIN  HFA) 108 (90 Base) MCG/ACT inhaler, Inhale 2 puffs into the lungs every 6 (six) hours as needed for wheezing or shortness of breath., Disp: 8 g, Rfl: 0   Alcohol Swabs (B-D SINGLE USE SWABS REGULAR) PADS, 1 each by Does not apply route daily., Disp: 400 each, Rfl: 1   aspirin  EC 81 MG tablet, Take 1 tablet (81 mg total) by mouth daily., Disp: 90 tablet, Rfl: 0   atorvastatin  (LIPITOR) 40 MG tablet, Take 1 tablet (40 mg total) by  mouth 4 (four) times a week., Disp: 48 tablet, Rfl: 1   Biotin 1000 MCG CHEW, Chew 1,000 mg by mouth daily., Disp: , Rfl:    Blood Glucose Monitoring Suppl (TRUE METRIX AIR GLUCOSE METER) w/Device KIT, 1 each by Does not apply route once for 1 dose., Disp: 1 kit, Rfl: 0   calcium  citrate (CALCITRATE - DOSED IN MG ELEMENTAL CALCIUM ) 950 MG tablet, Take 200 mg of elemental calcium  by mouth daily. Has D3 in it as well, Disp: , Rfl:    celecoxib  (CELEBREX ) 100 MG capsule, Take 1 capsule (100 mg total) by mouth 2 (two) times daily as needed., Disp: 90 capsule, Rfl: 0   Cholecalciferol (VITAMIN D ) 125 MCG (5000 UT) CAPS, Take by mouth. Pt taking 3 times per week, Disp: ,  Rfl:    Coenzyme Q10 (COQ10) 100 MG CAPS, Take 1 capsule by mouth daily., Disp: , Rfl:    fluticasone  (FLONASE ) 50 MCG/ACT nasal spray, Place 2 sprays into both nostrils daily., Disp: 16 g, Rfl: 6   glucose blood (TRUE METRIX BLOOD GLUCOSE TEST) test strip, TEST BLOOD SUGAR IN THE MORNING, AT NOON, AND AT BEDTIME, Disp: 200 strip, Rfl: 0   insulin  degludec (TRESIBA  FLEXTOUCH) 200 UNIT/ML FlexTouch Pen, Inject 28 Units into the skin daily. Patient receives through [employer] Patient Assistance through Dec 2022 (Patient taking differently: Inject 12 Units into the skin daily. Patient receives through [employer] Patient Assistance through Dec 2022), Disp: , Rfl:    Insulin  Pen Needle (NOVOFINE) 30G X 8 MM MISC, Inject 10 each into the skin as needed., Disp: 100 each, Rfl: 2   ketoconazole  (NIZORAL ) 2 % cream, Apply BID to aa, inframammary for 2 weeks then can go down to using every week at night for intertrigo., Disp: 30 g, Rfl: 3   lisinopril  (ZESTRIL ) 2.5 MG tablet, Take 1 tablet (2.5 mg total) by mouth daily., Disp: 90 tablet, Rfl: 1   metFORMIN  (GLUCOPHAGE -XR) 750 MG 24 hr tablet, Take 1 tablet (750 mg total) by mouth daily with breakfast., Disp: 90 tablet, Rfl: 1   metroNIDAZOLE  (METROCREAM ) 0.75 % cream, Apply qd/bid at mid face and nose for rosacea, Disp: 45 g, Rfl: 5   mupirocin  ointment (BACTROBAN ) 2 %, Apply 1 Application topically 2 (two) times daily., Disp: 22 g, Rfl: 0   Semaglutide , 2 MG/DOSE, 8 MG/3ML SOPN, Inject 2 mg as directed once a week., Disp: 9 mL, Rfl: 0   VITAMIN B COMPLEX-C PO, Take by mouth., Disp: , Rfl:   Allergies  Allergen Reactions   Latex Rash   Erythromycin Other (See Comments)    Strawberry tongue    ROS  Ten systems reviewed and is negative except as mentioned in HPI    Objective  Vitals:   03/10/24 0858  BP: 108/72  Pulse: 99  Resp: 16  SpO2: 100%  Weight: 160 lb 12.8 oz (72.9 kg)  Height: 5' 3.5 (1.613 m)    Body mass index is 28.04  kg/m.  Physical Exam CONSTITUTIONAL: Patient appears well-developed and well-nourished. No distress. HEENT: Head atraumatic, normocephalic, neck supple. CARDIOVASCULAR: Normal rate, regular rhythm and normal heart sounds. No murmur heard. No BLE edema. PULMONARY: Effort normal and breath sounds normal. No respiratory distress. ABDOMINAL: There is no tenderness or distention. MUSCULOSKELETAL: antalgic gait.  Without gross motor or sensory deficit. Left outer hip  swollen compared to right, no redness, no increased warmth. PSYCHIATRIC: Patient has a normal mood and affect. Behavior is normal. Judgment and thought  content normal.  Recent Results (from the past 2160 hours)  POCT glycosylated hemoglobin (Hb A1C)     Status: Abnormal   Collection Time: 03/01/24  1:45 PM  Result Value Ref Range   Hemoglobin A1C 6.5 (A) 4.0 - 5.6 %   HbA1c POC (<> result, manual entry)     HbA1c, POC (prediabetic range)     HbA1c, POC (controlled diabetic range)    Urine Microalbumin w/creat. ratio     Status: None   Collection Time: 03/03/24  9:45 AM  Result Value Ref Range   Creatinine, Urine 111 20 - 275 mg/dL   Microalb, Ur 0.6 mg/dL    Comment: Reference Range Not established    Microalb Creat Ratio 5 <30 mg/g creat    Comment: . The ADA defines abnormalities in albumin excretion as follows: SABRA Albuminuria Category        Result (mg/g creatinine) . Normal to Mildly increased   <30 Moderately increased         30-299  Severely increased           > OR = 300 . The ADA recommends that at least two of three specimens collected within a 3-6 month period be abnormal before considering a patient to be within a diagnostic category.   Comprehensive metabolic panel with GFR     Status: Abnormal   Collection Time: 03/03/24  9:45 AM  Result Value Ref Range   Glucose, Bld 88 65 - 99 mg/dL    Comment: .            Fasting reference interval .    BUN 10 7 - 25 mg/dL   Creat 9.41 (L) 9.39 - 1.00  mg/dL   eGFR 92 > OR = 60 fO/fpw/8.26f7   BUN/Creatinine Ratio 17 6 - 22 (calc)   Sodium 141 135 - 146 mmol/L   Potassium 4.1 3.5 - 5.3 mmol/L   Chloride 105 98 - 110 mmol/L   CO2 28 20 - 32 mmol/L   Calcium  9.5 8.6 - 10.4 mg/dL   Total Protein 6.3 6.1 - 8.1 g/dL   Albumin 3.9 3.6 - 5.1 g/dL   Globulin 2.4 1.9 - 3.7 g/dL (calc)   AG Ratio 1.6 1.0 - 2.5 (calc)   Total Bilirubin 0.7 0.2 - 1.2 mg/dL   Alkaline phosphatase (APISO) 60 37 - 153 U/L   AST 14 10 - 35 U/L   ALT 13 6 - 29 U/L  CBC with Differential/Platelet     Status: None   Collection Time: 03/03/24  9:45 AM  Result Value Ref Range   WBC 6.8 3.8 - 10.8 Thousand/uL   RBC 4.03 3.80 - 5.10 Million/uL   Hemoglobin 11.9 11.7 - 15.5 g/dL   HCT 64.2 64.9 - 54.9 %   MCV 88.6 80.0 - 100.0 fL   MCH 29.5 27.0 - 33.0 pg   MCHC 33.3 32.0 - 36.0 g/dL    Comment: For adults, a slight decrease in the calculated MCHC value (in the range of 30 to 32 g/dL) is most likely not clinically significant; however, it should be interpreted with caution in correlation with other red cell parameters and the patient's clinical condition.    RDW 12.9 11.0 - 15.0 %   Platelets 260 140 - 400 Thousand/uL   MPV 9.9 7.5 - 12.5 fL   Neutro Abs 4,318 1,500 - 7,800 cells/uL   Absolute Lymphocytes 1,761 850 - 3,900 cells/uL   Absolute Monocytes 428 200 - 950  cells/uL   Eosinophils Absolute 252 15 - 500 cells/uL   Basophils Absolute 41 0 - 200 cells/uL   Neutrophils Relative % 63.5 %   Total Lymphocyte 25.9 %   Monocytes Relative 6.3 %   Eosinophils Relative 3.7 %   Basophils Relative 0.6 %  Lipid panel     Status: None   Collection Time: 03/03/24  9:45 AM  Result Value Ref Range   Cholesterol 153 <200 mg/dL   HDL 63 > OR = 50 mg/dL   Triglycerides 72 <849 mg/dL   LDL Cholesterol (Calc) 75 mg/dL (calc)    Comment: Reference range: <100 . Desirable range <100 mg/dL for primary prevention;   <70 mg/dL for patients with CHD or diabetic patients   with > or = 2 CHD risk factors. SABRA LDL-C is now calculated using the Martin-Hopkins  calculation, which is a validated novel method providing  better accuracy than the Friedewald equation in the  estimation of LDL-C.  Gladis APPLETHWAITE et al. SANDREA. 7986;689(80): 2061-2068  (http://education.[facility].com/faq/FAQ164)    Total CHOL/HDL Ratio 2.4 <5.0 (calc)   Non-HDL Cholesterol (Calc) 90 <869 mg/dL (calc)    Comment: For patients with diabetes plus 1 major ASCVD risk  factor, treating to a non-HDL-C goal of <100 mg/dL  (LDL-C of <29 mg/dL) is considered a therapeutic  option.   B12 and Folate Panel     Status: Abnormal   Collection Time: 03/03/24  9:45 AM  Result Value Ref Range   Vitamin B-12 >2,000 (H) 200 - 1,100 pg/mL   Folate 12.4 ng/mL    Comment:                            Reference Range                            Low:           <3.4                            Borderline:    3.4-5.4                            Normal:        >5.4 .   VITAMIN D  25 Hydroxy (Vit-D Deficiency, Fractures)     Status: None   Collection Time: 03/03/24  9:45 AM  Result Value Ref Range   Vit D, 25-Hydroxy 54 30 - 100 ng/mL    Comment: Vitamin D  Status         25-OH Vitamin D : . Deficiency:                    <20 ng/mL Insufficiency:             20 - 29 ng/mL Optimal:                 > or = 30 ng/mL . For 25-OH Vitamin D  testing on patients on  D2-supplementation and patients for whom quantitation  of D2 and D3 fractions is required, the QuestAssureD(TM) 25-OH VIT D, (D2,D3), LC/MS/MS is recommended: order  code 07111 (patients >57yrs). . See Note 1 . Note 1 . For additional information, please refer to  http://education.[facility].com/faq/FAQ199  (This link is being provided for informational/ educational purposes only.)   Extra  Status: None   Collection Time: 03/03/24  9:45 AM  Result Value Ref Range   EXTRA TUBE RECEIVED      Comment: . An extra tube was received  without a test specified. We will hold this specimen in our cold storage in the event additional testing is requested. Please contact your local client service representative for further assistance. SABRA    SPECIMEN TYPE RECEIVED: SERUM-SST       Assessment & Plan Trochanteric bursitis, left hip Chronic left hip pain with swelling, no redness or warmth. Pain improves with movement, worsens with prolonged activity. Differential includes lipoma or nerve compression. Corticosteroid injection administered with informed consent. - Administer corticosteroid injection to the left hip. - Advise to monitor carbohydrate intake for 48-72 hours post-injection. - Consider ultrasound if pain persists to rule out a mass causing  nerve compression since it is much larger on the left side when compared to right side  - Discuss potential referral to interventional radiologist for ultrasound-guided injection if needed.  Consent form signed Localized bursa  Area prepped with alcohol  Injection with lidocaine  1% and Kenalog  40mg /1 ml on bursa sack ( or close to it - since so much swelling in the area)  Patient tolerated procedure well No side effects   Type 2 diabetes mellitus, well controlled Type 2 diabetes well controlled with A1c of 6.5%. No concerns regarding corticosteroid injection. - Monitor blood sugar levels, especially after corticosteroid injection. - Advise to reduce carbohydrate intake for 48-72 hours post-injection.

## 2024-04-14 ENCOUNTER — Encounter: Payer: Self-pay | Admitting: Pharmacist

## 2024-04-14 ENCOUNTER — Other Ambulatory Visit: Payer: Self-pay | Admitting: Family Medicine

## 2024-04-14 ENCOUNTER — Telehealth: Payer: Self-pay

## 2024-04-14 ENCOUNTER — Telehealth: Payer: Self-pay | Admitting: Family Medicine

## 2024-04-14 ENCOUNTER — Other Ambulatory Visit: Payer: Self-pay | Admitting: Pharmacist

## 2024-04-14 DIAGNOSIS — M7062 Trochanteric bursitis, left hip: Secondary | ICD-10-CM

## 2024-04-14 DIAGNOSIS — M545 Low back pain, unspecified: Secondary | ICD-10-CM

## 2024-04-14 DIAGNOSIS — E1121 Type 2 diabetes mellitus with diabetic nephropathy: Secondary | ICD-10-CM

## 2024-04-14 NOTE — Progress Notes (Signed)
   04/14/2024  Patient ID: Christine Roberts, female   DOB: 1946-02-08, 78 y.o.   MRN: [id number]  Patient enrolled in Ozempic  patient assistance program from [employer] through 07/07/2024  [employer] has announced that they will no longer offer Ozempic  to Medicare beneficiaries through their Patient Assistance Program in 2026.   Outreach to patient today and provide this update.   Based on reported income, patient does not meet criteria for Extra Help subsidy  Counsel patient that Medicare's Annual Enrollment Period for 2026 starts 04/21/2024. Encourage patient to review deductibles formulary coverage and copay amounts (including for Ozempic ) between plans. Also share with patient the contact information for the [facility] Information Program [facility]) that can help with reviewing these Medicare plans Will also share this information with patient via MyChart message  Sharyle Sia, PharmD, [facility] [phone]

## 2024-04-14 NOTE — Patient Instructions (Signed)
 [employer], the maker of Ozempic  and Rybelsus , has announced that they will no longer offer these medications to Medicare beneficiaries through their Patient Assistance Program in 2026.    This means that if you currently receive Ozempic  or Rybelsus  at no cost through the program, starting in January 2026 you'll need to use your insurance to continue on these medicines.      Choosing a Medicare Plan   With Medicare's Annual Enrollment Period starting on October 15th, we encourage you to review formulary coverage and copay amounts for Ozempic  or Rybelsus , as costs can vary widely between plans. Starting on Wednesday, October 1st you can compare your Medicare plan options by going to the Plan Finder tool at [employer].gov ( TeleconferenceOnDemand.fr ).    There are Medicare Specialists through the [place] [facility] Insurance Information Program (Chance Freer) that can help you shop for Medicare plans.    [place] [facility] toll free number: [phone] (Monday - Friday 8 am - 5 pm)   There are representatives located in each county:    [facility] [address]     [place]  [address] [phone] Call for an appointment with [facility]   Maximum Out-of-Pocket and Prescription Payment Plan   In 2026, the maximum yearly out-of-pocket cost for medications is $2,100 for all Medicare beneficiaries. This means you will not have to pay more than $2,100 in total for all prescription medications filled on your Medicare plan in 2026. You may also choose to enroll in a Medicare Prescription Payment Plan through your chosen 2026 Medicare plan. This lets you spread out your prescription drug costs over the year instead of laying a large amount all at once at the pharmacy.    Sharyle Sia, PharmD, [facility] [phone]

## 2024-04-14 NOTE — Telephone Encounter (Signed)
 Copied from CRM [id number]. Topic: Referral - Request for Referral >> Apr 14, 2024  9:36 AM Everette C wrote: Did the patient discuss referral with their provider in the last year? No (If No - schedule appointment) (If Yes - send message)  Appointment offered? No  Type of order/referral and detailed reason for visit: Christine Roberts. Chasnis, DO [facility], left hip concern   Preference of office, provider, location: [facility]   If referral order, have you been seen by this specialty before? No (If Yes, this issue or another issue? When? Where?  Can we respond through MyChart? No

## 2024-04-14 NOTE — Telephone Encounter (Signed)
 Filled and faxed provider portion [employer] (Ozempic ,Tresiba , needles) for provider to sign and date can be fax to [employer] or back to [phone]. Left a HIPAA Vm at pt number.

## 2024-04-16 NOTE — Telephone Encounter (Signed)
 Received provider portion [employer] (Ozempic ) faxed to [employer] today.

## 2024-04-21 ENCOUNTER — Other Ambulatory Visit: Payer: Self-pay | Admitting: Family Medicine

## 2024-04-21 DIAGNOSIS — M1612 Unilateral primary osteoarthritis, left hip: Secondary | ICD-10-CM

## 2024-04-21 DIAGNOSIS — M5416 Radiculopathy, lumbar region: Secondary | ICD-10-CM

## 2024-04-26 ENCOUNTER — Other Ambulatory Visit: Payer: Self-pay | Admitting: Family Medicine

## 2024-04-26 DIAGNOSIS — E1121 Type 2 diabetes mellitus with diabetic nephropathy: Secondary | ICD-10-CM

## 2024-04-26 DIAGNOSIS — I10 Essential (primary) hypertension: Secondary | ICD-10-CM

## 2024-04-27 ENCOUNTER — Other Ambulatory Visit

## 2024-04-27 NOTE — Progress Notes (Signed)
 S:     Chief Complaint  Patient presents with   Medication Management    Reason for visit: ?  Christine Roberts is a 78 y.o. female with a history of diabetes (type 2), who presents today for a follow up diabetes Telephone pharmacotherapy visit.? Pertinent PMH also includes HTN, aortic atherosclerosis, HLD.   Care Team: Primary Care Provider: Sowles, Krichna, MD  Today, patient reports fluctuating between Tresiba  12 - 14 units daily based on BG readings. She reports fasting BG is typically between 105-125 mg/dL.   Current diabetes medications include: Ozempic  2 mg weekly, metformin  XR 750 mg daily, Tresiba  12-14 units daily  Previous diabetes medications include: Farxiga   Current hypertension medications include: lisinopril  2.5 mg daily Current hyperlipidemia medications include: atorvastatin  40 mg four times a week  Patient reports adherence to taking all medications as prescribed.   Have you been experiencing any side effects to the medications prescribed? no Do you have any problems obtaining medications due to transportation or finances? no Insurance coverage: [employer]  Current medication access support: enrolled in [employer] patient assistance for Tresiba  and Ozempic  through 07/07/2024   Patient denies hypoglycemic events.  Reported home fasting blood sugars: 105-125 mg/dL   DM Prevention:  Statin: Taking; high intensity.?  History of albuminuria? no, last UACR on 03/03/24 = 5 mg/g Last eye exam:  Lab Results  Component Value Date   HMDIABEYEEXA No Retinopathy 11/03/2023   Tobacco Use:  Tobacco Use: Low Risk  (04/20/2024)   Received from [facility]   Patient History    Smoking Tobacco Use: Never    Smokeless Tobacco Use: Never    Passive Exposure: Not on file  Recent Concern: Tobacco Use - Medium Risk (03/10/2024)   Patient History    Smoking Tobacco Use: Former    Smokeless Tobacco Use: Never    Passive Exposure: Past   O:  Vitals:   Wt Readings from Last 3 Encounters:  03/10/24 160 lb 12.8 oz (72.9 kg)  03/01/24 160 lb 11.2 oz (72.9 kg)  10/27/23 159 lb 3.2 oz (72.2 kg)   BP Readings from Last 3 Encounters:  03/10/24 108/72  03/01/24 118/76  10/27/23 124/78   Pulse Readings from Last 3 Encounters:  03/10/24 99  03/01/24 91  10/27/23 99     Labs:?  Lab Results  Component Value Date   HGBA1C 6.5 (A) 03/01/2024   HGBA1C 6.9 (A) 10/27/2023   HGBA1C 6.9 (A) 06/23/2023   GLUCOSE 88 03/03/2024   MICRALBCREAT 5 03/03/2024   MICRALBCREAT 3 02/21/2023   MICRALBCREAT 5 02/19/2022   CREATININE 0.58 (L) 03/03/2024   CREATININE 0.74 02/21/2023   CREATININE 1.26 (H) 04/18/2022    Lab Results  Component Value Date   CHOL 153 03/03/2024   LDLCALC 75 03/03/2024   LDLCALC 88 02/21/2023   LDLCALC 101 (H) 02/19/2022   HDL 63 03/03/2024   TRIG 72 03/03/2024   TRIG 131 02/21/2023   TRIG 224 (H) 02/19/2022   ALT 13 03/03/2024   ALT 11 02/21/2023   AST 14 03/03/2024   AST 13 02/21/2023      Chemistry      Component Value Date/Time   NA 141 03/03/2024 0945   NA 140 06/21/2021 1208   K 4.1 03/03/2024 0945   CL 105 03/03/2024 0945   CO2 28 03/03/2024 0945   BUN 10 03/03/2024 0945   BUN 11 06/21/2021 1208   CREATININE 0.58 (L) 03/03/2024 0945  Component Value Date/Time   CALCIUM  9.5 03/03/2024 0945   ALKPHOS 58 04/18/2022 1440   AST 14 03/03/2024 0945   ALT 13 03/03/2024 0945   BILITOT 0.7 03/03/2024 0945   BILITOT 0.4 04/04/2015 0810       The ASCVD Risk score (Arnett DK, et al., 2019) failed to calculate for the following reasons:   Risk score cannot be calculated because patient has a medical history suggesting prior/existing ASCVD  Lab Results  Component Value Date   MICRALBCREAT 5 03/03/2024   MICRALBCREAT 3 02/21/2023   MICRALBCREAT 5 02/19/2022   MICRALBCREAT 2 02/12/2021   MICRALBCREAT 2 02/09/2020   MICRALBCREAT NOTE 11/23/2018   MICRALBCREAT 3 11/17/2017    A/P: Diabetes  currently controlled with a most recent A1c of 6.5% on 03/01/24 (A1c goal of <7-7.5% based on age). Patient is able to verbalize appropriate hypoglycemia management plan. Medication adherence appears appropriate. Discussed that patient should be eligible for CGM on insurance if she is would like to use this. Patient denies any issues with fingerstick monitoring at this time. Discussed that [employer] will no longer be offering Ozempic  via PAP for Medicare beneficiaries in 2026. It does not appear that she is eligible for Medicare Extra Help based on income, however, she will have to obtain a denial letter to continue receiving insulin  from the program.  -Continued basal insulin  Tresiba  (insulin  degludec)  12 units daily. Encouraged patient to avoid self-adjustment unless instructed by provider.  -Continued GLP-1 Ozempic  (semaglutide ) 2 mg weekly -Continued metformin  XR 750 mg daily.   -Extensively discussed pathophysiology of diabetes, recommended lifestyle interventions, dietary effects on blood sugar control.  -Counseled on s/sx of and management of hypoglycemia.  -Encouraged for patient to contact Medicare health insurance specialist to discuss the best plan for the upcoming year.  -Will reach out to medication assistance team to help with Medicare Extra Help enrollment.   ASCVD risk - secondary prevention in patient with diabetes. Last LDL is 75 mg/dL, not at goal of <29 mg/dL. -Continued atorvastatin  40 mg daily.   Patient verbalized understanding of treatment plan. Total time patient counseling 45 minutes.  Follow-up:  Pharmacist on 05/21/24 PCP clinic visit on 07/14/23  Peyton CHARLENA Ferries, PharmD Clinical Pharmacist [facility] [phone]

## 2024-04-28 ENCOUNTER — Other Ambulatory Visit: Payer: Self-pay | Admitting: Pharmacist

## 2024-05-03 ENCOUNTER — Ambulatory Visit
Admission: RE | Admit: 2024-05-03 | Discharge: 2024-05-03 | Disposition: A | Discharge status: Home / Self Care | Source: Ambulatory Visit | Attending: Family Medicine | Admitting: Family Medicine

## 2024-05-03 ENCOUNTER — Telehealth: Payer: Self-pay

## 2024-05-03 DIAGNOSIS — M1612 Unilateral primary osteoarthritis, left hip: Secondary | ICD-10-CM

## 2024-05-03 DIAGNOSIS — M5416 Radiculopathy, lumbar region: Secondary | ICD-10-CM

## 2024-05-03 NOTE — Telephone Encounter (Signed)
 Called to follow-up on ExtraHelp application and complete with pt, sent to voicemail and LVM

## 2024-05-21 ENCOUNTER — Telehealth: Payer: Self-pay

## 2024-05-21 ENCOUNTER — Other Ambulatory Visit: Payer: Self-pay

## 2024-05-21 NOTE — Progress Notes (Unsigned)
 S:     No chief complaint on file.   Reason for visit: ?  Christine Roberts is a 78 y.o. female with a history of diabetes (type 2), who presents today for a follow up diabetes Telephone pharmacotherapy visit.? Pertinent PMH also includes HTN, aortic atherosclerosis, HLD.   Care Team: Primary Care Provider: Sowles, Krichna, MD  Today, patient reports fluctuating between Tresiba  12 - 14 units daily based on BG readings. She reports fasting BG is typically between 105-125 mg/dL.   Current diabetes medications include: Ozempic  2 mg weekly, metformin  XR 750 mg daily, Tresiba  12-14 units daily  Previous diabetes medications include: Farxiga   Current hypertension medications include: lisinopril  2.5 mg daily Current hyperlipidemia medications include: atorvastatin  40 mg four times a week  Patient reports adherence to taking all medications as prescribed.   Have you been experiencing any side effects to the medications prescribed? no Do you have any problems obtaining medications due to transportation or finances? no Insurance coverage: [employer]  Current medication access support: enrolled in [employer] patient assistance for Tresiba  and Ozempic  through 07/07/2024   Patient denies hypoglycemic events.  Reported home fasting blood sugars: 105-125 mg/dL   DM Prevention:  Statin: Taking; high intensity.?  History of albuminuria? no, last UACR on 03/03/24 = 5 mg/g Last eye exam:  Lab Results  Component Value Date   HMDIABEYEEXA No Retinopathy 11/03/2023   Tobacco Use:  Tobacco Use: Low Risk  (05/18/2024)   Received from [facility]   Patient History    Smoking Tobacco Use: Never    Smokeless Tobacco Use: Never    Passive Exposure: Not on file  Recent Concern: Tobacco Use - Medium Risk (03/10/2024)   Patient History    Smoking Tobacco Use: Former    Smokeless Tobacco Use: Never    Passive Exposure: Past   O:  Vitals:  Wt Readings from Last 3 Encounters:   03/10/24 160 lb 12.8 oz (72.9 kg)  03/01/24 160 lb 11.2 oz (72.9 kg)  10/27/23 159 lb 3.2 oz (72.2 kg)   BP Readings from Last 3 Encounters:  03/10/24 108/72  03/01/24 118/76  10/27/23 124/78   Pulse Readings from Last 3 Encounters:  03/10/24 99  03/01/24 91  10/27/23 99     Labs:?  Lab Results  Component Value Date   HGBA1C 6.5 (A) 03/01/2024   HGBA1C 6.9 (A) 10/27/2023   HGBA1C 6.9 (A) 06/23/2023   GLUCOSE 88 03/03/2024   MICRALBCREAT 5 03/03/2024   MICRALBCREAT 3 02/21/2023   MICRALBCREAT 5 02/19/2022   CREATININE 0.58 (L) 03/03/2024   CREATININE 0.74 02/21/2023   CREATININE 1.26 (H) 04/18/2022    Lab Results  Component Value Date   CHOL 153 03/03/2024   LDLCALC 75 03/03/2024   LDLCALC 88 02/21/2023   LDLCALC 101 (H) 02/19/2022   HDL 63 03/03/2024   TRIG 72 03/03/2024   TRIG 131 02/21/2023   TRIG 224 (H) 02/19/2022   ALT 13 03/03/2024   ALT 11 02/21/2023   AST 14 03/03/2024   AST 13 02/21/2023      Chemistry      Component Value Date/Time   NA 141 03/03/2024 0945   NA 140 06/21/2021 1208   K 4.1 03/03/2024 0945   CL 105 03/03/2024 0945   CO2 28 03/03/2024 0945   BUN 10 03/03/2024 0945   BUN 11 06/21/2021 1208   CREATININE 0.58 (L) 03/03/2024 0945      Component Value Date/Time  CALCIUM  9.5 03/03/2024 0945   ALKPHOS 58 04/18/2022 1440   AST 14 03/03/2024 0945   ALT 13 03/03/2024 0945   BILITOT 0.7 03/03/2024 0945   BILITOT 0.4 04/04/2015 0810       The ASCVD Risk score (Arnett DK, et al., 2019) failed to calculate for the following reasons:   Risk score cannot be calculated because patient has a medical history suggesting prior/existing ASCVD  Lab Results  Component Value Date   MICRALBCREAT 5 03/03/2024   MICRALBCREAT 3 02/21/2023   MICRALBCREAT 5 02/19/2022   MICRALBCREAT 2 02/12/2021   MICRALBCREAT 2 02/09/2020   MICRALBCREAT NOTE 11/23/2018   MICRALBCREAT 3 11/17/2017    A/P: Diabetes currently controlled with a most recent  A1c of 6.5% on 03/01/24 (A1c goal of <7-7.5% based on age). Patient is able to verbalize appropriate hypoglycemia management plan. Medication adherence appears appropriate. Discussed that patient should be eligible for CGM on insurance if she is would like to use this. Patient denies any issues with fingerstick monitoring at this time. Discussed that [employer] will no longer be offering Ozempic  via PAP for Medicare beneficiaries in 2026. It does not appear that she is eligible for Medicare Extra Help based on income, however, she will have to obtain a denial letter to continue receiving insulin  from the program.  -Continued basal insulin  Tresiba  (insulin  degludec)  12 units daily. Encouraged patient to avoid self-adjustment unless instructed by provider.  -Continued GLP-1 Ozempic  (semaglutide ) 2 mg weekly -Continued metformin  XR 750 mg daily.   -Extensively discussed pathophysiology of diabetes, recommended lifestyle interventions, dietary effects on blood sugar control.  -Counseled on s/sx of and management of hypoglycemia.  -Encouraged for patient to contact Medicare health insurance specialist to discuss the best plan for the upcoming year.  -Will reach out to medication assistance team to help with Medicare Extra Help enrollment.   ASCVD risk - secondary prevention in patient with diabetes. Last LDL is 75 mg/dL, not at goal of <29 mg/dL. -Continued atorvastatin  40 mg daily.   Patient verbalized understanding of treatment plan. Total time patient counseling 45 minutes.  Follow-up:  Pharmacist on 05/21/24 PCP clinic visit on 07/14/23  Peyton CHARLENA Ferries, PharmD Clinical Pharmacist [facility] [phone]

## 2024-05-21 NOTE — Progress Notes (Signed)
   05/21/2024  Christine Roberts  DOB: [birth year] MRN: [id number]  Attempted to contact patient for medication management/review appointment today.  Will route to scheduling team to get patient rescheduled.  [name] E. Marsh, PharmD Clinical Pharmacist [facility] [phone]

## 2024-05-26 ENCOUNTER — Ambulatory Visit: Payer: Self-pay

## 2024-05-26 NOTE — Telephone Encounter (Signed)
 Did not triage to ED d/t jaw pain being on the same side as ear pain and is worse with movement. Patient denies higher acuity questions.   FYI Only or Action Required?: Action required by provider: update on patient condition. Apt scheduled 05/27/24  Patient was last seen in primary care on 03/10/2024 by Glenard Mire, MD.  Called Nurse Triage reporting Sinusitis.  Symptoms began several days ago.  Interventions attempted: OTC medications: ibuprofen.  Symptoms are: gradually worsening.  Triage Disposition: See Physician Within 24 Hours  Patient/caregiver understands and will follow disposition?:  Reason for Disposition  [1] Sinus pain (not just congestion) AND [2] fever  Earache  Answer Assessment - Initial Assessment Questions Congestion, right ear pain radiating to right jaw, mild headache, fatigue, sore throat and productive cough with yellow/green sputum. Denies facial tenderness. Denies fever. States jaw pain/right ear/right neck are painful with movement. Denies SOB or wheezing.  1. LOCATION: Where does it hurt?      Right ear, jaw 2. ONSET: When did the sinus pain start?  (e.g., hours, days)      05/23/24 3. SEVERITY: How bad is the pain?   (Scale 0-10; or none, mild, moderate or severe)     Moderate 4. RECURRENT SYMPTOM: Have you ever had sinus problems before? If Yes, ask: When was the last time? and What happened that time?      Hx of pneumonia 5. NASAL CONGESTION: Is the nose blocked? If Yes, ask: Can you open it or must you breathe through your mouth?     A little bit 6. NASAL DISCHARGE: Do you have discharge from your nose? If so ask, What color?     Yes, clear 7. FEVER: Do you have a fever? If Yes, ask: What is it, how was it measured, and when did it start?      Has not checked 8. OTHER SYMPTOMS: Do you have any other symptoms? (e.g., sore throat, cough, earache, difficulty breathing)     See above  Protocols used: Sinus Pain or  Congestion-A-AH   Copied from CRM #[id number]. Topic: Clinical - Red Word Triage >> May 26, 2024  8:10 AM Donna BRAVO wrote: Red Word that prompted transfer to Nurse Triage: patient calling  Symptoms: - coughingmucus yellow/green -right bottom jaw joint pain -right ear pain -sore throat -no fever - just don't feel good

## 2024-05-27 ENCOUNTER — Encounter: Payer: Self-pay | Admitting: Internal Medicine

## 2024-05-27 ENCOUNTER — Other Ambulatory Visit: Payer: Self-pay

## 2024-05-27 ENCOUNTER — Ambulatory Visit (INDEPENDENT_AMBULATORY_CARE_PROVIDER_SITE_OTHER): Admitting: Internal Medicine

## 2024-05-27 VITALS — BP 130/82 | HR 100 | Temp 98.0°F | Resp 18 | Ht 60.5 in | Wt 155.1 lb

## 2024-05-27 DIAGNOSIS — R051 Acute cough: Secondary | ICD-10-CM | POA: Diagnosis not present

## 2024-05-27 DIAGNOSIS — H66001 Acute suppurative otitis media without spontaneous rupture of ear drum, right ear: Secondary | ICD-10-CM

## 2024-05-27 LAB — POC COVID19/FLU A&B COMBO
Covid Antigen, POC: NEGATIVE
Influenza A Antigen, POC: NEGATIVE
Influenza B Antigen, POC: NEGATIVE

## 2024-05-27 MED ORDER — BENZONATATE 100 MG PO CAPS: 100.0000 mg | ORAL_CAPSULE | Freq: Two times a day (BID) | ORAL | 0 refills | Status: DC | PRN

## 2024-05-27 MED ORDER — FLUCONAZOLE 150 MG PO TABS: 150.0000 mg | ORAL_TABLET | Freq: Once | ORAL | 0 refills | Status: AC

## 2024-05-27 MED ORDER — AMOXICILLIN-POT CLAVULANATE 875-125 MG PO TABS: 1.0000 | ORAL_TABLET | Freq: Two times a day (BID) | ORAL | 0 refills | Status: AC

## 2024-05-27 NOTE — Progress Notes (Signed)
 Acute Office Visit  Subjective:     Patient ID: Christine Roberts, female    DOB: 02-22-1946, 78 y.o.   MRN: [id number]  Chief Complaint  Patient presents with   URI    URI  Associated symptoms include congestion, coughing and ear pain. Pertinent negatives include no sinus pain, sore throat or wheezing.   Patient is in today for coughing and congestion started 4 days.    Discussed the use of AI scribe software for clinical note transcription with the patient, who gave verbal consent to proceed.  History of Present Illness Christine Roberts is a 78 year old female with diabetes who presents with symptoms suggestive of a sinus infection.  Symptoms began on Sunday with a cough and general malaise, progressively worsening. She experiences ear fullness, decreased hearing, jaw pain, neck pain, and a scratchy throat. There is drainage and a productive cough with green-yellow sputum. She has not noted a fever.  She uses over-the-counter medications such as DayQuil and Robitussin, and takes ibuprofen as needed for symptom relief.  Her medical history includes diabetes, pneumonia, COVID-19 twice, bronchitis, and the flu last year. She is vaccinated and has an allergy to erythromycin but tolerates penicillins well.   Review of Systems  Constitutional:  Negative for chills and fever.  HENT:  Positive for congestion and ear pain. Negative for sinus pain and sore throat.   Respiratory:  Positive for cough and sputum production. Negative for shortness of breath and wheezing.         Objective:    BP 130/82 (Cuff Size: Large)   Pulse 100   Temp 98 F (36.7 C) (Oral)   Resp 18   Ht 5' 0.5 (1.537 m)   Wt 155 lb 1.6 oz (70.4 kg)   SpO2 97%   BMI 29.79 kg/m    Physical Exam Constitutional:      Appearance: Normal appearance.  HENT:     Head: Normocephalic and atraumatic.     Right Ear: Ear canal and external ear normal. Tenderness present. Tympanic membrane is erythematous and bulging.      Left Ear: Tympanic membrane, ear canal and external ear normal.     Nose: Congestion present.     Mouth/Throat:     Mouth: Mucous membranes are moist.     Pharynx: Posterior oropharyngeal erythema present.  Eyes:     Conjunctiva/sclera: Conjunctivae normal.  Cardiovascular:     Rate and Rhythm: Normal rate and regular rhythm.  Pulmonary:     Effort: Pulmonary effort is normal.     Breath sounds: Normal breath sounds. No wheezing, rhonchi or rales.  Skin:    General: Skin is warm and dry.  Neurological:     General: No focal deficit present.     Mental Status: She is alert. Mental status is at baseline.  Psychiatric:        Mood and Affect: Mood normal.        Behavior: Behavior normal.     No results found for any visits on 05/27/24.      Assessment & Plan:   Assessment & Plan Acute suppurative otitis media, right ear Acute suppurative otitis media with erythema, pus, and fluid. Previous eardrum perforation noted. Discussed risk of yeast infection due to antibiotics. - Prescribed Augmentin for 7 days, twice daily. - Advised probiotics or yogurt to prevent diarrhea. - Prescribed antifungal medication, one pill initially, second if symptoms persist after three days if yeast symptoms develop. - Instructed to  avoid inserting objects into ear canal. - Advised to return if pain persists, worsens, or fever develops. - POC flu and COVID negative.   Acute cough Green-yellow sputum, likely related to sinus drainage and ear infection. Prefers non-codeine cough medicine. - Prescribed non-codeine cough medicine. - Advised to monitor symptoms and return if no improvement after antibiotics.   - POC Covid19/Flu A&B Antigen - benzonatate  (TESSALON ) 100 MG capsule; Take 1 capsule (100 mg total) by mouth 2 (two) times daily as needed for cough.  Dispense: 20 capsule; Refill: 0 - amoxicillin -clavulanate (AUGMENTIN ) 875-125 MG tablet; Take 1 tablet by mouth 2 (two) times daily for 7  days.  Dispense: 14 tablet; Refill: 0 - fluconazole  (DIFLUCAN ) 150 MG tablet; Take 1 tablet (150 mg total) by mouth once for 1 dose.  Dispense: 3 tablet; Refill: 0   Return if symptoms worsen or fail to improve.  Sharyle Fischer, DO

## 2024-05-28 ENCOUNTER — Telehealth: Payer: Self-pay

## 2024-05-28 NOTE — Progress Notes (Signed)
 Complex Care Management Care Guide Note  05/28/2024 Name: Christine Roberts MRN: [id number] DOB: 1945-08-30  Christine Roberts is a 78 y.o. year old female who is a primary care patient of Sowles, Krichna, MD and is actively engaged with the care management team. I reached out to Inocente JAYSON Daring by phone today to assist with re-scheduling  with the Pharmacist.  Follow up plan: Telephone appointment with complex care management team member scheduled for:  06/08/24 @ 11 AM  Leotis Cloria Davene Salome Romell Ralph Valrie, [facility] Guide  Direct Dial: [phone]  Fax [phone]

## 2024-06-08 ENCOUNTER — Telehealth: Payer: Self-pay

## 2024-06-08 ENCOUNTER — Other Ambulatory Visit (HOSPITAL_COMMUNITY): Payer: Self-pay

## 2024-06-08 ENCOUNTER — Other Ambulatory Visit (INDEPENDENT_AMBULATORY_CARE_PROVIDER_SITE_OTHER)

## 2024-06-08 DIAGNOSIS — Z794 Long term (current) use of insulin: Secondary | ICD-10-CM

## 2024-06-08 DIAGNOSIS — E1121 Type 2 diabetes mellitus with diabetic nephropathy: Secondary | ICD-10-CM

## 2024-06-08 MED ORDER — FREESTYLE LIBRE 3 PLUS SENSOR MISC: 3 refills | Status: DC

## 2024-06-08 NOTE — Progress Notes (Signed)
 S:     Reason for visit: ?  Christine Roberts is a 78 y.o. female with a history of diabetes (type 2), who presents today for a follow up diabetes Telephone pharmacotherapy visit.? Pertinent PMH also includes HTN, aortic atherosclerosis, HLD.   Care Team: Primary Care Provider: Sowles, Krichna, MD  Today, patient reports she determined she was not eligible for Medicare Extra Help based on income.   Current diabetes medications include:  Ozempic  2 mg weekly, metformin  XR 750 mg daily, Tresiba  12 units daily  Previous diabetes medications include: Farxiga   Current hypertension medications include: lisinopril  2.5 mg daily Current hyperlipidemia medications include: atorvastatin  40 mg four times a week  Patient reports adherence to taking all medications as prescribed.   Have you been experiencing any side effects to the medications prescribed? no Do you have any problems obtaining medications due to transportation or finances? no Insurance coverage: [employer]  Current medication access support:  Tresiba  - re-enrolling in PAP for 2026  Patient denies hypoglycemic events.  Reported home fasting blood sugars: 100-120 mg/dL   DM Prevention:  Statin: Taking; high intensity.?  ACE/ARB: yes; lisinopril  Last urinary albumin/creatinine ratio:  Lab Results  Component Value Date   MICRALBCREAT 5 03/03/2024   MICRALBCREAT 3 02/21/2023   MICRALBCREAT 5 02/19/2022   MICRALBCREAT 2 02/12/2021   MICRALBCREAT 2 02/09/2020   MICRALBCREAT NOTE 11/23/2018   MICRALBCREAT 3 11/17/2017   Last eye exam:  Lab Results  Component Value Date   HMDIABEYEEXA No Retinopathy 11/03/2023   Lab Results  Component Value Date   HMDIABEYEEXA No Retinopathy 11/03/2023   Last foot exam: No foot exam found Tobacco Use:  Tobacco Use: Medium Risk (05/27/2024)   Patient History    Smoking Tobacco Use: Former    Smokeless Tobacco Use: Never    Passive Exposure: Past   O:   Vitals:  Wt Readings  from Last 3 Encounters:  05/27/24 155 lb 1.6 oz (70.4 kg)  03/10/24 160 lb 12.8 oz (72.9 kg)  03/01/24 160 lb 11.2 oz (72.9 kg)   BP Readings from Last 3 Encounters:  05/27/24 130/82  03/10/24 108/72  03/01/24 118/76   Pulse Readings from Last 3 Encounters:  05/27/24 100  03/10/24 99  03/01/24 91     Labs:?  Lab Results  Component Value Date   HGBA1C 6.5 (A) 03/01/2024   HGBA1C 6.9 (A) 10/27/2023   HGBA1C 6.9 (A) 06/23/2023   GLUCOSE 88 03/03/2024   MICRALBCREAT 5 03/03/2024   MICRALBCREAT 3 02/21/2023   MICRALBCREAT 5 02/19/2022   CREATININE 0.58 (L) 03/03/2024   CREATININE 0.74 02/21/2023   CREATININE 1.26 (H) 04/18/2022    Lab Results  Component Value Date   CHOL 153 03/03/2024   LDLCALC 75 03/03/2024   LDLCALC 88 02/21/2023   LDLCALC 101 (H) 02/19/2022   HDL 63 03/03/2024   TRIG 72 03/03/2024   TRIG 131 02/21/2023   TRIG 224 (H) 02/19/2022   ALT 13 03/03/2024   ALT 11 02/21/2023   AST 14 03/03/2024   AST 13 02/21/2023      Chemistry      Component Value Date/Time   NA 141 03/03/2024 0945   NA 140 06/21/2021 1208   K 4.1 03/03/2024 0945   CL 105 03/03/2024 0945   CO2 28 03/03/2024 0945   BUN 10 03/03/2024 0945   BUN 11 06/21/2021 1208   CREATININE 0.58 (L) 03/03/2024 0945      Component Value Date/Time  CALCIUM  9.5 03/03/2024 0945   ALKPHOS 58 04/18/2022 1440   AST 14 03/03/2024 0945   ALT 13 03/03/2024 0945   BILITOT 0.7 03/03/2024 0945   BILITOT 0.4 04/04/2015 0810       The ASCVD Risk score (Arnett DK, et al., 2019) failed to calculate for the following reasons:   Risk score cannot be calculated because patient has a medical history suggesting prior/existing ASCVD  Lab Results  Component Value Date   MICRALBCREAT 5 03/03/2024   MICRALBCREAT 3 02/21/2023   MICRALBCREAT 5 02/19/2022   MICRALBCREAT 2 02/12/2021   MICRALBCREAT 2 02/09/2020   MICRALBCREAT NOTE 11/23/2018   MICRALBCREAT 3 11/17/2017    A/P: Diabetes currently  controlled with a most recent A1c of 6.5% on 03/01/24. Patient is able to verbalize appropriate hypoglycemia management plan. Medication adherence appears appropriate. Patient is planning to schedule meeting with insurance rep to discuss insurance plan before open enrollment ends on 06/13/24. If she is unable to obtain Ozempic  on insurance in the new year, will need to discuss alternate therapy options at that time. Patient is agreeable to CGM for assistance with BG monitoring.  -Continued basal insulin  Tresiba  (insulin  degludec)  12 units daily.  -Continued GLP-1 Ozempic  (semaglutide ) 2 mg weekly.  -Continued metformin  XR 750 mg daily.  -Encouraged patient to meet with insurance representative before the end of the week to discuss plans for the new year -Extensively discussed pathophysiology of diabetes, recommended lifestyle interventions, dietary effects on blood sugar control.  -Counseled on s/sx of and management of hypoglycemia.  -Sent prescription to [employer] [facility] to pharmacy. Provided general education over the phone. Will provide in-depth education and placement at follow up appointment in person  ASCVD risk - secondary prevention in patient with diabetes. Last LDL is 75 mg/dL, not at goal of <29 mg/dL. -Continued atorvastatin  40 mg daily.   Patient verbalized understanding of treatment plan. Total time patient counseling 30 minutes.  Follow-up:  Pharmacist on 07/13/24 PCP clinic visit on 07/13/24  Peyton CHARLENA Ferries, PharmD, CPP Clinical Pharmacist [facility] [phone]

## 2024-06-08 NOTE — Telephone Encounter (Signed)
 Gave pt a call ,pt is due for re-enrollment on [employer] (Tresiba ),spoke with pt ,pt gave consent to do pap online, faxed provider portion . Today.

## 2024-06-11 NOTE — Telephone Encounter (Signed)
 Received provider portion [employer] Tresiba , faxed to [employer] today.

## 2024-06-15 NOTE — Telephone Encounter (Signed)
 Received approval letter from [employer] Tresiba  and pen needles, approval letter index

## 2024-06-23 ENCOUNTER — Ambulatory Visit: Payer: Self-pay

## 2024-06-23 NOTE — Telephone Encounter (Signed)
 FYI Only or Action Required?: Action required by provider: clinical question for provider and update on patient condition.  Patient was last seen in primary care on 05/27/2024 by Bernardo Fend, DO.  Called Nurse Triage reporting Otalgia.  Symptoms began several weeks ago.  Interventions attempted: OTC medications: ibuprofen and Prescription medications: tessalon ; augmentin .  Symptoms are: unchanged.  Triage Disposition: See Physician Within 24 Hours  Patient/caregiver understands and will follow disposition?: Yes    Copied from CRM #[id number]. Topic: Clinical - Red Word Triage >> Jun 23, 2024  9:00 AM Victoria B wrote: [facility] that prompted transfer to Nurse Triage: patient has pain in right ear, sinus drainage coughing ,phlegm is a little green and pain jaw, not severe, says issue getting worse but not severe >> Jun 23, 2024  9:19 AM Victoria B wrote: Patient couldn't hold any longer. Please cb    Reason for Disposition  Earache  (Exceptions: Brief ear pain of lasting less than 60 minutes, or earache occurring during air travel.)  Answer Assessment - Initial Assessment Questions Returned call to pt. She states that she saw Dr. Bernardo on 11/20, finished her augmentin  BID x7 days and took tessalon  as prescribed. Stated that all symptoms had resolved except R ear pain. Pt reports going on a cruise last week and now R ear pain 7/10 and congestion has returned. Pt denies going underwater on vacation and reports her ears did pop during air travel. Pt states she does have a hx of fluid behind her R ear but this seems like the same as what she was seen for 11/20. Pt requesting additional dose of abx or something further; states that Dr. Bernardo told her to f/u if symptoms did not improve. Pt states she is taking ibuprofen PRN for discomfort. Please advise.    1. LOCATION: Which ear is involved?     R ear   2. ONSET: When did the ear pain start?      Started before appt 11/20  and has not resolved   3. SEVERITY: How bad is the pain?  (Scale 1-10; mild, moderate or severe)     7/10  4. URI SYMPTOMS: Do you have a runny nose or cough?     Yes; productive cough with green mucous   5. FEVER: Do you have a fever? If Yes, ask: What is your temperature, how was it measured, and when did it start?     None   6. CAUSE: Have you been swimming recently?, How often do you use Q-TIPS?, Have you had any recent air travel or scuba diving?     None   7. OTHER SYMPTOMS: Do you have any other symptoms? (e.g., decreased hearing, dizziness, headache, stiff neck, vomiting)     Sinus drainage, ear pressure  Protocols used: Earache-A-AH

## 2024-06-24 ENCOUNTER — Ambulatory Visit: Payer: Self-pay

## 2024-06-24 NOTE — Telephone Encounter (Signed)
 FYI Only or Action Required?: Action required by provider: clinical question for provider and update on patient condition.  Patient was last seen in primary care on 05/27/2024 by Bernardo Fend, DO.  Called Nurse Triage reporting Cough.  Symptoms began several days ago.  Interventions attempted: Rest, hydration, or home remedies.  Symptoms are: unchanged.  Triage Disposition: See HCP Within 4 Hours (Or PCP Triage)  Patient/caregiver understands and will follow disposition?: No, wishes to speak with PCP  Copied from CRM #[id number]. Topic: Clinical - Red Word Triage >> Jun 24, 2024  8:24 AM Deaijah H wrote: Red Word that prompted transfer to Nurse Triage: Sick with same thing a month ago, sinus infection, ear infection.. Coughing up mucus (green/yellow), scratchy throat, drainage.. Kind of dizzy, pain jaw from ear. Wanting more medication. Reason for Disposition  [1] MILD difficulty breathing (e.g., minimal/no SOB at rest, SOB with walking, pulse < 100) AND [2] still present when not coughing  Answer Assessment - Initial Assessment Questions Patient reports productive cough with some shortness of breath along with ear pain. Patient is requesting antibiotics be sent to her pharmacy. Requesting a call back from the office.   1. ONSET: When did the cough begin?      Cough increased after coming back from her cruise last week.  2. SEVERITY: How bad is the cough today?      severe 3. SPUTUM: Describe the color of your sputum (e.g., none, dry cough; clear, white, yellow, green)     Green/yellow 4. HEMOPTYSIS: Are you coughing up any blood? If Yes, ask: How much? (e.g., flecks, streaks, tablespoons, etc.)     no 5. DIFFICULTY BREATHING: Are you having difficulty breathing? If Yes, ask: How bad is it? (e.g., mild, moderate, severe)      Breathing isn't normal 6. FEVER: Do you have a fever? If Yes, ask: What is your temperature, how was it measured, and when did it  start?     99.6 yesterday 7. CARDIAC HISTORY: Do you have any history of heart disease? (e.g., heart attack, congestive heart failure)      no 8. LUNG HISTORY: Do you have any history of lung disease?  (e.g., pulmonary embolus, asthma, emphysema)     no 9. PE RISK FACTORS: Do you have a history of blood clots? (or: recent major surgery, recent prolonged travel, bedridden)     no 10. OTHER SYMPTOMS: Do you have any other symptoms? (e.g., runny nose, wheezing, chest pain)       Sinus congestion, dizziness, scratchy throat, ear pain 12. TRAVEL: Have you traveled out of the country in the last month? (e.g., travel history, exposures)       Went on a cruise  Protocols used: Cough - Acute Productive-A-AH

## 2024-06-24 NOTE — Telephone Encounter (Signed)
 will she need another appt?

## 2024-06-25 NOTE — Telephone Encounter (Signed)
 Pt went to urgent care. We had no openings

## 2024-07-02 ENCOUNTER — Telehealth: Payer: Self-pay | Admitting: Family Medicine

## 2024-07-02 NOTE — Telephone Encounter (Unsigned)
 Copied from CRM #[id number]. Topic: Clinical - Refused Triage >> Jul 02, 2024  9:16 AM Larissa RAMAN wrote: Patient/caller voiced complaints of Ear pain, congestion, cough w/ mucous . Declined transfer to triage.

## 2024-07-13 ENCOUNTER — Ambulatory Visit (INDEPENDENT_AMBULATORY_CARE_PROVIDER_SITE_OTHER): Admitting: Family Medicine

## 2024-07-13 ENCOUNTER — Encounter: Payer: Self-pay | Admitting: Family Medicine

## 2024-07-13 ENCOUNTER — Ambulatory Visit

## 2024-07-13 ENCOUNTER — Telehealth: Payer: Self-pay

## 2024-07-13 VITALS — BP 122/74 | HR 92 | Resp 16 | Ht 60.5 in | Wt 159.7 lb

## 2024-07-13 DIAGNOSIS — E1169 Type 2 diabetes mellitus with other specified complication: Secondary | ICD-10-CM

## 2024-07-13 DIAGNOSIS — E538 Deficiency of other specified B group vitamins: Secondary | ICD-10-CM

## 2024-07-13 DIAGNOSIS — Z794 Long term (current) use of insulin: Secondary | ICD-10-CM

## 2024-07-13 DIAGNOSIS — H9201 Otalgia, right ear: Secondary | ICD-10-CM

## 2024-07-13 DIAGNOSIS — Z7985 Long-term (current) use of injectable non-insulin antidiabetic drugs: Secondary | ICD-10-CM

## 2024-07-13 DIAGNOSIS — I129 Hypertensive chronic kidney disease with stage 1 through stage 4 chronic kidney disease, or unspecified chronic kidney disease: Secondary | ICD-10-CM

## 2024-07-13 DIAGNOSIS — E1122 Type 2 diabetes mellitus with diabetic chronic kidney disease: Secondary | ICD-10-CM

## 2024-07-13 DIAGNOSIS — N1831 Chronic kidney disease, stage 3a: Secondary | ICD-10-CM

## 2024-07-13 DIAGNOSIS — I700 Atherosclerosis of aorta: Secondary | ICD-10-CM

## 2024-07-13 DIAGNOSIS — E559 Vitamin D deficiency, unspecified: Secondary | ICD-10-CM

## 2024-07-13 DIAGNOSIS — E785 Hyperlipidemia, unspecified: Secondary | ICD-10-CM

## 2024-07-13 DIAGNOSIS — Z23 Encounter for immunization: Secondary | ICD-10-CM

## 2024-07-13 LAB — POCT GLYCOSYLATED HEMOGLOBIN (HGB A1C): Hemoglobin A1C: 8.2 % — AB (ref 4.0–5.6)

## 2024-07-13 MED ORDER — LISINOPRIL 2.5 MG PO TABS: 2.5000 mg | ORAL_TABLET | Freq: Every day | ORAL | 1 refills | Status: AC

## 2024-07-13 MED ORDER — ATORVASTATIN CALCIUM 40 MG PO TABS: 40.0000 mg | ORAL_TABLET | ORAL | 1 refills | Status: AC

## 2024-07-13 MED ORDER — METFORMIN HCL ER 750 MG PO TB24: 750.0000 mg | ORAL_TABLET | Freq: Every day | ORAL | 1 refills | Status: AC

## 2024-07-13 NOTE — Progress Notes (Signed)
 Name: Christine Roberts   MRN: [id number]    DOB: 04/22/1946   Date:07/13/2024       Progress Note  Subjective  Chief Complaint  Chief Complaint  Patient presents with   Medical Management of Chronic Issues   Ear Pain    Had 2 round of antibiotics not better from her R ear   Discussed the use of AI scribe software for clinical note transcription with the patient, who gave verbal consent to proceed.  History of Present Illness Christine Roberts is an 79 year old female with chronic sinus issues who presents with persistent ear pain and sinus infection symptoms.  She has fluid behind her right ear and jaw pain, initially treated with Augmentin  for a sinus infection diagnosed five weeks ago. The treatment alleviated most symptoms except for the persistent ear pain. The pain is located around her jaw and lymph nodes, with a sensation of locking in the jaw.  After initial improvement, she experienced a relapse of sinus infection symptoms following a cruise and subsequent flight, which exposed her to a crowded environment with people coughing and sneezing. The symptoms, including a cough, returned a couple of days after returning home on December 13th  She has a history of ear issues and recalls previous discussions about possibly needing a tube in her ear. She uses Flonase  nasal spray and an inhaler as part of her current management.  Her medication regimen includes atorvastatin , taken four times a week for atherosclerosis of aorta, and lisinopril  2.5 mg for hypertension and chronic kidney disease stage 3A. She also takes vitamin D  and B12 supplements. For diabetes management, she uses Ozempic  2 mg, Tresiba  14 units, and Metformin  750 mg. Her fasting blood sugar is generally around 100 mg/dL, though her J8r has increased from 6.5% to 8.2% since August 25th. She thinks glucose is up due to not following a healthy diet and recent cruise. She denies polyphagia, polydipsia or polyuria   She denies chest pain,  or palpitations. She mentions feeling tired and in a 'fog' due to her ongoing ear pain and sinus issues. She reports difficulty breathing during the initial illness but is currently breathing well.    Patient Active Problem List   Diagnosis Date Noted   Vitamin D  deficiency 03/01/2024   Atherosclerosis of aorta 05/25/2018   Seasonal allergic rhinitis 09/12/2015   TIA (transient ischemic attack) 05/24/2015   Senile purpura (HCC) 05/09/2015   Benign essential HTN 03/18/2015   Pain in shoulder 03/18/2015   Dyslipidemia 03/18/2015   History of surgery to major organs, presenting hazards to health 03/18/2015   Hearing loss 03/18/2015   Hiatal hernia 03/18/2015   History of colon polyps 03/18/2015   Calculus of kidney 03/18/2015   B12 deficiency 03/18/2015   Microalbuminuria 03/18/2015   Adult BMI 30+ 03/18/2015   Arthritis, degenerative 03/18/2015   Type 2 diabetes with nephropathy (HCC) 03/18/2015    Past Surgical History:  Procedure Laterality Date   ABDOMINAL HYSTERECTOMY     CHOLECYSTECTOMY     COLONOSCOPY  2015   COLONOSCOPY WITH PROPOFOL  N/A 03/04/2018   Procedure: COLONOSCOPY WITH PROPOFOL ;  Surgeon: Toledo, Ladell POUR, MD;  Location: [facility];  Service: Gastroenterology;  Laterality: N/A;   COLONOSCOPY WITH PROPOFOL  N/A 09/03/2023   Procedure: COLONOSCOPY WITH PROPOFOL ;  Surgeon: Toledo, Ladell POUR, MD;  Location: [facility];  Service: Gastroenterology;  Laterality: N/A;   cystoscopic     stent placed in ureter   LITHOTRIPSY  12/2007  MYRINGOTOMY Right 11/2009   POLYPECTOMY  09/03/2023   Procedure: POLYPECTOMY;  Surgeon: Toledo, Ladell POUR, MD;  Location: [facility];  Service: Gastroenterology;;    Family History  Problem Relation Age of Onset   Cancer Mother    Diabetes Mother    Diabetes Daughter    CAD Father    Diabetes Daughter        hypoglycemia    Social History   Tobacco Use   Smoking status: Former    Current packs/day: 0.50    Average  packs/day: 0.5 packs/day for 10.0 years (5.0 ttl pk-yrs)    Types: Cigarettes    Passive exposure: Past   Smokeless tobacco: Never   Tobacco comments:    smoking cessation materials not required.  Quit 30 years ago.  Substance Use Topics   Alcohol use: Yes    Alcohol/week: 1.0 standard drink of alcohol    Types: 1 Shots of liquor per week    Comment: socially    Current Medications[1]  Allergies[2]  I personally reviewed active problem list, medication list, allergies, family history with the patient/caregiver today.   ROS  Ten systems reviewed and is negative except as mentioned in HPI    Objective Physical Exam  CONSTITUTIONAL: Patient appears well-developed and well-nourished. No distress. HEENT: Head atraumatic, normocephalic, neck supple. Right ear with retraction and scarring. Left ear normal. CARDIOVASCULAR: Normal rate, regular rhythm and normal heart sounds. No murmur heard. No BLE edema. PULMONARY: Effort normal and breath sounds normal. No respiratory distress. ABDOMINAL: There is no tenderness or distention. MUSCULOSKELETAL: Normal gait. Without gross motor or sensory deficit. PSYCHIATRIC: Patient has a normal mood and affect. Behavior is normal. Judgment and thought content normal.  Vitals:   07/13/24 1049  BP: 122/74  Pulse: 92  Resp: 16  SpO2: 97%  Weight: 159 lb 11.2 oz (72.4 kg)  Height: 5' 0.5 (1.537 m)    Body mass index is 30.68 kg/m.  Recent Results (from the past 2160 hours)  POC Covid19/Flu A&B Antigen     Status: None   Collection Time: 05/27/24  9:58 AM  Result Value Ref Range   Influenza A Antigen, POC Negative Negative   Influenza B Antigen, POC Negative Negative   Covid Antigen, POC Negative Negative      PHQ2/9:    07/13/2024   10:45 AM 03/10/2024    8:54 AM 03/01/2024    1:27 PM 09/25/2023    3:20 PM 06/23/2023    1:45 PM  Depression screen PHQ 2/9  Decreased Interest 0 0 0 0 0  Down, Depressed, Hopeless 0 0 0 0 0  PHQ - 2  Score 0 0 0 0 0  Altered sleeping    0   Tired, decreased energy    0   Change in appetite    0   Feeling bad or failure about yourself     0   Trouble concentrating    0   Moving slowly or fidgety/restless    0   Suicidal thoughts    0   PHQ-9 Score    0    Difficult doing work/chores    Not difficult at all      Data saved with a previous flowsheet row definition    phq 9 is negative  Fall Risk:    07/13/2024   10:45 AM 03/10/2024    8:54 AM 03/01/2024    1:27 PM 09/25/2023    3:22 PM 06/23/2023    1:45 PM  Fall Risk   Falls in the past year? 0 0 0 0 0  Number falls in past yr: 0 0 0 0 0  Injury with Fall? 0 0  0  0  0   Risk for fall due to : No Fall Risks No Fall Risks No Fall Risks No Fall Risks No Fall Risks  Follow up Falls evaluation completed Falls evaluation completed  Falls prevention discussed;Falls evaluation completed Falls prevention discussed;Education provided;Falls evaluation completed     Data saved with a previous flowsheet row definition      Assessment & Plan Chronic right otalgia and temporomandibular joint arthralgia Persistent right ear and TMJ pain likely due to fluid retention and TMJ arthritis. Differential includes TMJ arthritis and fluid retention. - Referred to ENT for evaluation and potential tube placement. - Advised use of Flonase  nasal spray. - Recommended soft diet to reduce TMJ strain. - Suggested Tylenol  for pain management. - Advised use of topical Voltaren  for inflammation, avoiding NSAIDs due to chronic kidney disease.  Type 2 diabetes mellitus with stage 3a chronic kidney disease and hypertension Type 2 diabetes with increased A1c from 6.5% to 8.2%. Blood pressure controlled. Chronic kidney disease stage 3a managed with lisinopril . - Continue Ozempic , Tresiba , and metformin . - Monitor blood glucose levels, especially postprandial, and report if consistently high. - Consider adding pioglitazone  if blood glucose control does not  improve. - Continue lisinopril  for hypertension and chronic kidney disease. - Administered flu shot.  Atherosclerosis of aorta and dyslipidemia Atherosclerosis and dyslipidemia managed with atorvastatin . - Continue atorvastatin  as prescribed. - Sent prescription refill to [facility].  Vitamin D  deficiency Managed with over-the-counter supplements. - Continue over-the-counter vitamin D  supplements.  Vitamin B12 deficiency Managed with over-the-counter supplements. - Continue over-the-counter vitamin B12 supplements.        [1]  Current Outpatient Medications:    ACCU-CHEK SOFTCLIX LANCETS lancets, 1 each by Other route daily., Disp: 100 each, Rfl: 1   acetaminophen  (TYLENOL ) 650 MG CR tablet, Take 650 mg by mouth every 8 (eight) hours as needed., Disp: , Rfl:    albuterol  (VENTOLIN  HFA) 108 (90 Base) MCG/ACT inhaler, Inhale 2 puffs into the lungs every 6 (six) hours as needed for wheezing or shortness of breath., Disp: 8 g, Rfl: 0   aspirin  EC 81 MG tablet, Take 1 tablet (81 mg total) by mouth daily., Disp: 90 tablet, Rfl: 0   atorvastatin  (LIPITOR) 40 MG tablet, Take 1 tablet (40 mg total) by mouth 4 (four) times a week., Disp: 48 tablet, Rfl: 1   Biotin 1000 MCG CHEW, Chew 1,000 mg by mouth daily., Disp: , Rfl:    Blood Glucose Monitoring Suppl (TRUE METRIX AIR GLUCOSE METER) w/Device KIT, 1 each by Does not apply route once for 1 dose., Disp: 1 kit, Rfl: 0   calcium -vitamin D  (OSCAL WITH D) 500-5 MG-MCG tablet, Take 1 tablet by mouth., Disp: , Rfl:    Coenzyme Q10 (COQ10) 100 MG CAPS, Take 1 capsule by mouth daily., Disp: , Rfl:    Continuous Glucose Sensor (FREESTYLE LIBRE 3 PLUS SENSOR) MISC, Change sensor every 15 days., Disp: 6 each, Rfl: 3   fluticasone  (FLONASE ) 50 MCG/ACT nasal spray, Place 2 sprays into both nostrils daily., Disp: 16 g, Rfl: 6   glucose blood (TRUE METRIX BLOOD GLUCOSE TEST) test strip, TEST BLOOD SUGAR IN THE MORNING, AT NOON, AND AT BEDTIME, Disp: 200  strip, Rfl: 0   insulin  degludec (TRESIBA  FLEXTOUCH) 200 UNIT/ML FlexTouch Pen, Inject 28 Units into  the skin daily. Patient receives through [employer] Patient Assistance through Dec 2022 (Patient taking differently: Inject 14 Units into the skin daily. Patient receives through [employer] Patient Assistance through Dec 2022), Disp: , Rfl:    Insulin  Pen Needle (NOVOFINE) 30G X 8 MM MISC, Inject 10 each into the skin as needed., Disp: 100 each, Rfl: 2   ketoconazole  (NIZORAL ) 2 % cream, Apply BID to aa, inframammary for 2 weeks then can go down to using every week at night for intertrigo., Disp: 30 g, Rfl: 3   lisinopril  (ZESTRIL ) 2.5 MG tablet, Take 1 tablet by mouth once daily, Disp: 90 tablet, Rfl: 0   metFORMIN  (GLUCOPHAGE -XR) 750 MG 24 hr tablet, Take 1 tablet by mouth once daily with breakfast, Disp: 90 tablet, Rfl: 0   metroNIDAZOLE  (METROCREAM ) 0.75 % cream, Apply qd/bid at mid face and nose for rosacea, Disp: 45 g, Rfl: 5   mupirocin  ointment (BACTROBAN ) 2 %, Apply 1 Application topically 2 (two) times daily., Disp: 22 g, Rfl: 0   Semaglutide , 2 MG/DOSE, 8 MG/3ML SOPN, Inject 2 mg as directed once a week., Disp: 9 mL, Rfl: 0   VITAMIN B COMPLEX-C PO, Take by mouth., Disp: , Rfl:  [2]  Allergies Allergen Reactions   Latex Rash   Erythromycin Other (See Comments)    Strawberry tongue

## 2024-07-13 NOTE — Telephone Encounter (Signed)
 Email filled PAP [facility] Trulicity to [facility].

## 2024-07-13 NOTE — Progress Notes (Deleted)
 "  S:     Reason for visit: ?  DARWIN GUASTELLA is a 79 y.o. female with a history of diabetes (type 2), who presents today for a follow up diabetes Telephone pharmacotherapy visit.? Pertinent PMH also includes HTN, aortic atherosclerosis, HLD.   Care Team: Primary Care Provider: Sowles, Krichna, MD  Today, patient reports she determined she was not eligible for Medicare Extra Help based on income.   Current diabetes medications include:  Ozempic  2 mg weekly, metformin  XR 750 mg daily, Tresiba  12 units daily  Previous diabetes medications include: Farxiga   Current hypertension medications include: lisinopril  2.5 mg daily Current hyperlipidemia medications include: atorvastatin  40 mg four times a week  Patient reports adherence to taking all medications as prescribed.   Have you been experiencing any side effects to the medications prescribed? no Do you have any problems obtaining medications due to transportation or finances? no Insurance coverage: [employer]  Current medication access support:  Tresiba  - re-enrolling in PAP for 2026  Patient denies hypoglycemic events.  Reported home fasting blood sugars: 100-120 mg/dL   DM Prevention:  Statin: Taking; high intensity.?  ACE/ARB: yes; lisinopril  Last urinary albumin/creatinine ratio:  Lab Results  Component Value Date   MICRALBCREAT 5 03/03/2024   MICRALBCREAT 3 02/21/2023   MICRALBCREAT 5 02/19/2022   MICRALBCREAT 2 02/12/2021   MICRALBCREAT 2 02/09/2020   MICRALBCREAT NOTE 11/23/2018   MICRALBCREAT 3 11/17/2017   Last eye exam:  Lab Results  Component Value Date   HMDIABEYEEXA No Retinopathy 11/03/2023   Lab Results  Component Value Date   HMDIABEYEEXA No Retinopathy 11/03/2023   Last foot exam: No foot exam found Tobacco Use:  Tobacco Use: Low Risk  (06/08/2024)   Received from [facility]   Patient History    Smoking Tobacco Use: Never    Smokeless Tobacco Use: Never    Passive  Exposure: Not on file  Recent Concern: Tobacco Use - Medium Risk (05/27/2024)   Patient History    Smoking Tobacco Use: Former    Smokeless Tobacco Use: Never    Passive Exposure: Past   O:   Vitals:  Wt Readings from Last 3 Encounters:  05/27/24 155 lb 1.6 oz (70.4 kg)  03/10/24 160 lb 12.8 oz (72.9 kg)  03/01/24 160 lb 11.2 oz (72.9 kg)   BP Readings from Last 3 Encounters:  05/27/24 130/82  03/10/24 108/72  03/01/24 118/76   Pulse Readings from Last 3 Encounters:  05/27/24 100  03/10/24 99  03/01/24 91     Labs:?  Lab Results  Component Value Date   HGBA1C 6.5 (A) 03/01/2024   HGBA1C 6.9 (A) 10/27/2023   HGBA1C 6.9 (A) 06/23/2023   GLUCOSE 88 03/03/2024   MICRALBCREAT 5 03/03/2024   MICRALBCREAT 3 02/21/2023   MICRALBCREAT 5 02/19/2022   CREATININE 0.58 (L) 03/03/2024   CREATININE 0.74 02/21/2023   CREATININE 1.26 (H) 04/18/2022    Lab Results  Component Value Date   CHOL 153 03/03/2024   LDLCALC 75 03/03/2024   LDLCALC 88 02/21/2023   LDLCALC 101 (H) 02/19/2022   HDL 63 03/03/2024   TRIG 72 03/03/2024   TRIG 131 02/21/2023   TRIG 224 (H) 02/19/2022   ALT 13 03/03/2024   ALT 11 02/21/2023   AST 14 03/03/2024   AST 13 02/21/2023      Chemistry      Component Value Date/Time   NA 141 03/03/2024 0945   NA 140 06/21/2021 1208  K 4.1 03/03/2024 0945   CL 105 03/03/2024 0945   CO2 28 03/03/2024 0945   BUN 10 03/03/2024 0945   BUN 11 06/21/2021 1208   CREATININE 0.58 (L) 03/03/2024 0945      Component Value Date/Time   CALCIUM  9.5 03/03/2024 0945   ALKPHOS 58 04/18/2022 1440   AST 14 03/03/2024 0945   ALT 13 03/03/2024 0945   BILITOT 0.7 03/03/2024 0945   BILITOT 0.4 04/04/2015 0810       The ASCVD Risk score (Arnett DK, et al., 2019) failed to calculate for the following reasons:   The 2019 ASCVD risk score is only valid for ages 57 to 55   Risk score cannot be calculated because patient has a medical history suggesting prior/existing  ASCVD   * - Cholesterol units were assumed  Lab Results  Component Value Date   MICRALBCREAT 5 03/03/2024   MICRALBCREAT 3 02/21/2023   MICRALBCREAT 5 02/19/2022   MICRALBCREAT 2 02/12/2021   MICRALBCREAT 2 02/09/2020   MICRALBCREAT NOTE 11/23/2018   MICRALBCREAT 3 11/17/2017    A/P: Diabetes currently controlled with a most recent A1c of 6.5% on 03/01/24. Patient is able to verbalize appropriate hypoglycemia management plan. Medication adherence appears appropriate. Patient is planning to schedule meeting with insurance rep to discuss insurance plan before open enrollment ends on 06/13/24. If she is unable to obtain Ozempic  on insurance in the new year, will need to discuss alternate therapy options at that time. Patient is agreeable to CGM for assistance with BG monitoring.  -Continued basal insulin  Tresiba  (insulin  degludec)  12 units daily.  -Continued GLP-1 Ozempic  (semaglutide ) 2 mg weekly.  -Continued metformin  XR 750 mg daily.  -Extensively discussed pathophysiology of diabetes, recommended lifestyle interventions, dietary effects on blood sugar control.  -Counseled on s/sx of and management of hypoglycemia.  -Sent prescription to [employer] 3+ to pharmacy. Provided general education over the phone. Will provide in-depth education and placement at follow up appointment in person  ASCVD risk - secondary prevention in patient with diabetes. Last LDL is 75 mg/dL, not at goal of <29 mg/dL. -Continued atorvastatin  40 mg daily.   Patient verbalized understanding of treatment plan. Total time patient counseling 30 minutes.  Follow-up:  Pharmacist on 07/13/24 PCP clinic visit on 07/13/24  Peyton CHARLENA Ferries, PharmD, CPP Clinical Pharmacist [facility] [phone]   "

## 2024-07-14 ENCOUNTER — Telehealth: Payer: Self-pay

## 2024-07-14 DIAGNOSIS — N760 Acute vaginitis: Secondary | ICD-10-CM

## 2024-07-14 MED ORDER — FLUCONAZOLE 150 MG PO TABS: 150.0000 mg | ORAL_TABLET | ORAL | 0 refills | Status: AC

## 2024-07-14 NOTE — Telephone Encounter (Signed)
 Brief Telephone Documentation Reason for Call: Rescheduling pharmacy appointment   Summary of Call: Contacted patient to reschedule pharmacy appointment from 07/13/24. Patient accidentally left the office after her PCP appointment and forgot she also had a pharmacy appointment.  Patient reported symptoms of a yeast infection since she completed her Augmentin  abx therapy about 3 weeks ago. She endorsed itching and a white discharge. She reported she forgot to bring this up during her appointment yesterday. Will let PCP know to determine if she needs to schedule an acute visit or if an antifungal medication can be sent to her pharmacy,  Follow Up: Patient given direct line for further questions/concerns.  Eliseo Withers E. Marsh, PharmD Clinical Pharmacist [facility] [phone]

## 2024-07-14 NOTE — Addendum Note (Signed)
 Addended by: MARSH, Kanda Deluna E on: 07/14/2024 11:55 AM   Modules accepted: Orders

## 2024-07-27 ENCOUNTER — Ambulatory Visit

## 2024-07-27 DIAGNOSIS — N1831 Chronic kidney disease, stage 3a: Secondary | ICD-10-CM | POA: Diagnosis not present

## 2024-07-27 DIAGNOSIS — I129 Hypertensive chronic kidney disease with stage 1 through stage 4 chronic kidney disease, or unspecified chronic kidney disease: Secondary | ICD-10-CM | POA: Diagnosis not present

## 2024-07-27 DIAGNOSIS — E1122 Type 2 diabetes mellitus with diabetic chronic kidney disease: Secondary | ICD-10-CM

## 2024-07-27 DIAGNOSIS — Z794 Long term (current) use of insulin: Secondary | ICD-10-CM | POA: Diagnosis not present

## 2024-07-27 MED ORDER — TRULICITY 4.5 MG/0.5ML ~~LOC~~ SOAJ: 4.5000 mg | SUBCUTANEOUS | 3 refills | Status: AC

## 2024-07-27 NOTE — Telephone Encounter (Signed)
 Received pap Trulicity  from provider office faxed to [facility] along pt portion proof of income and Ins card.

## 2024-07-27 NOTE — Progress Notes (Signed)
 "  S:     Reason for visit: ?  Christine Roberts is a 79 y.o. female with a history of diabetes (type 2), who presents today for a follow up diabetes Face to Face pharmacotherapy visit.? Pertinent PMH also includes HTN, aortic atherosclerosis, HLD.   They were referred to the pharmacist by their PCP for assistance in managing diabetes.   Care Team: Primary Care Provider: Sowles, Krichna, MD   Current diabetes medications include:  Ozempic  2 mg weekly, metformin  XR 750 mg daily, Tresiba  12 units daily  Previous diabetes medications include: Farxiga   Current hypertension medications include: lisinopril  2.5 mg daily Current hyperlipidemia medications include: atorvastatin  40 mg four times a week  Patient reports adherence to taking all medications as prescribed.   Have you been experiencing any side effects to the medications prescribed? no Do you have any problems obtaining medications due to transportation or finances? no Insurance coverage: [employer]  Current medication access support:  Tresiba  & pen needles via [employer] approved until 07/07/25  Patient denies hypoglycemic events.  Reported home fasting blood sugars: not checking  DM Prevention:  Statin: Taking; high intensity.?  ACE/ARB: yes; lisinopril  Last urinary albumin/creatinine ratio:  Lab Results  Component Value Date   MICRALBCREAT 5 03/03/2024   MICRALBCREAT 3 02/21/2023   MICRALBCREAT 5 02/19/2022   MICRALBCREAT 2 02/12/2021   MICRALBCREAT 2 02/09/2020   MICRALBCREAT NOTE 11/23/2018   MICRALBCREAT 3 11/17/2017   Last eye exam:  Lab Results  Component Value Date   HMDIABEYEEXA No Retinopathy 11/03/2023   Lab Results  Component Value Date   HMDIABEYEEXA No Retinopathy 11/03/2023   Last foot exam: No foot exam found Tobacco Use:  Tobacco Use: Medium Risk (07/13/2024)   Patient History    Smoking Tobacco Use: Former    Smokeless Tobacco Use: Never    Passive Exposure: Past   O:   Vitals:  Wt  Readings from Last 3 Encounters:  07/13/24 159 lb 11.2 oz (72.4 kg)  05/27/24 155 lb 1.6 oz (70.4 kg)  03/10/24 160 lb 12.8 oz (72.9 kg)   BP Readings from Last 3 Encounters:  07/13/24 122/74  05/27/24 130/82  03/10/24 108/72   Pulse Readings from Last 3 Encounters:  07/13/24 92  05/27/24 100  03/10/24 99     Labs:?  Lab Results  Component Value Date   HGBA1C 8.2 (A) 07/13/2024   HGBA1C 6.5 (A) 03/01/2024   HGBA1C 6.9 (A) 10/27/2023   GLUCOSE 88 03/03/2024   MICRALBCREAT 5 03/03/2024   MICRALBCREAT 3 02/21/2023   MICRALBCREAT 5 02/19/2022   CREATININE 0.58 (L) 03/03/2024   CREATININE 0.74 02/21/2023   CREATININE 1.26 (H) 04/18/2022    Lab Results  Component Value Date   CHOL 153 03/03/2024   LDLCALC 75 03/03/2024   LDLCALC 88 02/21/2023   LDLCALC 101 (H) 02/19/2022   HDL 63 03/03/2024   TRIG 72 03/03/2024   TRIG 131 02/21/2023   TRIG 224 (H) 02/19/2022   ALT 13 03/03/2024   ALT 11 02/21/2023   AST 14 03/03/2024   AST 13 02/21/2023      Chemistry      Component Value Date/Time   NA 141 03/03/2024 0945   NA 140 06/21/2021 1208   K 4.1 03/03/2024 0945   CL 105 03/03/2024 0945   CO2 28 03/03/2024 0945   BUN 10 03/03/2024 0945   BUN 11 06/21/2021 1208   CREATININE 0.58 (L) 03/03/2024 0945      Component  Value Date/Time   CALCIUM  9.5 03/03/2024 0945   ALKPHOS 58 04/18/2022 1440   AST 14 03/03/2024 0945   ALT 13 03/03/2024 0945   BILITOT 0.7 03/03/2024 0945   BILITOT 0.4 04/04/2015 0810       The ASCVD Risk score (Arnett DK, et al., 2019) failed to calculate for the following reasons:   The 2019 ASCVD risk score is only valid for ages 23 to 3   Risk score cannot be calculated because patient has a medical history suggesting prior/existing ASCVD   * - Cholesterol units were assumed  Lab Results  Component Value Date   MICRALBCREAT 5 03/03/2024   MICRALBCREAT 3 02/21/2023   MICRALBCREAT 5 02/19/2022   MICRALBCREAT 2 02/12/2021   MICRALBCREAT 2  02/09/2020   MICRALBCREAT NOTE 11/23/2018   MICRALBCREAT 3 11/17/2017    A/P: Diabetes currently uncontrolled with a most recent A1c of 8.2% on 07/13/24, which is up from 6.5% on 03/01/24. Patient is able to verbalize appropriate hypoglycemia management plan. Medication adherence appears appropriate. Ozempic  is ~$500 a month on insurance; will have to pursue patient assistance for Trulicity . Pateint reports she has about 3 months left of Ozempic  at this time. Patient is agreeable to CGM for assistance with BG monitoring.  -Continued basal insulin  Tresiba  (insulin  degludec)  12 units daily.  -Switched GLP-1 Ozempic  (semaglutide ) to Trulicity  (dulaglutide ) 4.5 mg weekly.  -Continued metformin  XR 750 mg daily.  -Extensively discussed pathophysiology of diabetes, recommended lifestyle interventions, dietary effects on blood sugar control.  -Counseled on s/sx of and management of hypoglycemia.  -Educated and placed [employer] 3+ in clinic today  ASCVD risk - secondary prevention in patient with diabetes. Last LDL is 75 mg/dL, not at goal of <29 mg/dL. -Continued atorvastatin  40 mg daily.   Patient verbalized understanding of treatment plan. Total time patient counseling 30 minutes.  Follow-up:  Pharmacist on 09/30/24 PCP clinic visit on 11/11/24  Peyton CHARLENA Ferries, PharmD, BCACP, CPP Clinical Pharmacist [facility] [phone]   "

## 2024-07-28 NOTE — Telephone Encounter (Signed)
 Received approval letter from [facility] on Trulicity  thru 07/07/2025,approval letter index.

## 2024-08-10 ENCOUNTER — Other Ambulatory Visit: Payer: Self-pay

## 2024-08-10 DIAGNOSIS — E1121 Type 2 diabetes mellitus with diabetic nephropathy: Secondary | ICD-10-CM

## 2024-08-10 MED ORDER — DEXCOM G7 RECEIVER DEVI: 0 refills | Status: AC

## 2024-08-10 MED ORDER — DEXCOM G7 SENSOR MISC: 4 refills | Status: AC

## 2024-08-10 NOTE — Progress Notes (Signed)
 Brief Telephone Documentation Reason for Call: CGM sensors  Summary of Call: It appear insurance prefers Dexcom over [employer] for continuous glucose monitoring. Sent Rx to pharmacy for Dexcom.   Follow Up: Patient given direct line for further questions/concerns.  Melva Faux E. Marsh, PharmD, BCACP, CPP Clinical Pharmacist [facility] [phone]

## 2024-09-06 ENCOUNTER — Other Ambulatory Visit

## 2024-09-30 ENCOUNTER — Ambulatory Visit

## 2024-11-11 ENCOUNTER — Ambulatory Visit: Admitting: Family Medicine

## 2025-02-21 ENCOUNTER — Encounter: Admitting: Dermatology
